# Patient Record
Sex: Male | Born: 1945
Health system: Southern US, Community
[De-identification: ages and names within clinical notes are randomized; demographics above are authoritative.]

## PROBLEM LIST (undated history)

## (undated) DIAGNOSIS — Z87438 Personal history of other diseases of male genital organs: Secondary | ICD-10-CM

## (undated) DIAGNOSIS — N2889 Other specified disorders of kidney and ureter: Secondary | ICD-10-CM

## (undated) DIAGNOSIS — Z8719 Personal history of other diseases of the digestive system: Secondary | ICD-10-CM

## (undated) DIAGNOSIS — I493 Ventricular premature depolarization: Secondary | ICD-10-CM

## (undated) DIAGNOSIS — K219 Gastro-esophageal reflux disease without esophagitis: Secondary | ICD-10-CM

## (undated) DIAGNOSIS — I447 Left bundle-branch block, unspecified: Secondary | ICD-10-CM

## (undated) DIAGNOSIS — I5022 Chronic systolic (congestive) heart failure: Secondary | ICD-10-CM

## (undated) DIAGNOSIS — I25719 Atherosclerosis of autologous vein coronary artery bypass graft(s) with unspecified angina pectoris: Secondary | ICD-10-CM

## (undated) DIAGNOSIS — Z952 Presence of prosthetic heart valve: Secondary | ICD-10-CM

## (undated) DIAGNOSIS — G4733 Obstructive sleep apnea (adult) (pediatric): Secondary | ICD-10-CM

## (undated) DIAGNOSIS — K2210 Ulcer of esophagus without bleeding: Secondary | ICD-10-CM

## (undated) DIAGNOSIS — Z87442 Personal history of urinary calculi: Secondary | ICD-10-CM

## (undated) DIAGNOSIS — Z8639 Personal history of other endocrine, nutritional and metabolic disease: Secondary | ICD-10-CM

## (undated) DIAGNOSIS — K029 Dental caries, unspecified: Secondary | ICD-10-CM

## (undated) DIAGNOSIS — I350 Nonrheumatic aortic (valve) stenosis: Secondary | ICD-10-CM

## (undated) DIAGNOSIS — Z951 Presence of aortocoronary bypass graft: Secondary | ICD-10-CM

## (undated) DIAGNOSIS — E7800 Pure hypercholesterolemia, unspecified: Secondary | ICD-10-CM

## (undated) DIAGNOSIS — N200 Calculus of kidney: Secondary | ICD-10-CM

## (undated) DIAGNOSIS — I10 Essential (primary) hypertension: Secondary | ICD-10-CM

## (undated) DIAGNOSIS — I2510 Atherosclerotic heart disease of native coronary artery without angina pectoris: Secondary | ICD-10-CM

## (undated) HISTORY — PX: CARDIAC CATHETERIZATION: SHX172

## (undated) HISTORY — DX: Gastro-esophageal reflux disease without esophagitis: K21.9

## (undated) HISTORY — DX: Atherosclerosis of autologous vein coronary artery bypass graft(s) with unspecified angina pectoris: I25.719

## (undated) HISTORY — PX: CYSTOSCOPY WITH STENT PLACEMENT: SHX5790

## (undated) HISTORY — DX: Nonrheumatic aortic (valve) stenosis: I35.0

## (undated) HISTORY — DX: Personal history of other diseases of the digestive system: Z87.19

## (undated) HISTORY — PX: TONSILLECTOMY: SUR1361

## (undated) HISTORY — DX: Chronic systolic (congestive) heart failure: I50.22

## (undated) HISTORY — DX: Personal history of other diseases of male genital organs: Z87.438

## (undated) HISTORY — PX: COLONOSCOPY W/ POLYPECTOMY: SHX1380

## (undated) HISTORY — DX: Pure hypercholesterolemia, unspecified: E78.00

## (undated) HISTORY — DX: Other specified disorders of kidney and ureter: N28.89

## (undated) HISTORY — DX: Personal history of urinary calculi: Z87.442

## (undated) HISTORY — DX: Atherosclerotic heart disease of native coronary artery without angina pectoris: I25.10

## (undated) HISTORY — DX: Presence of aortocoronary bypass graft: Z95.1

## (undated) HISTORY — DX: Personal history of other endocrine, nutritional and metabolic disease: Z86.39

---

## 1994-01-12 HISTORY — PX: CORONARY ARTERY BYPASS GRAFT: SHX141

## 1995-01-07 DIAGNOSIS — Z951 Presence of aortocoronary bypass graft: Secondary | ICD-10-CM

## 1995-01-07 HISTORY — DX: Presence of aortocoronary bypass graft: Z95.1

## 1997-05-18 ENCOUNTER — Other Ambulatory Visit: Admission: RE | Admit: 1997-05-18 | Discharge: 1997-05-18 | Payer: Self-pay | Admitting: Cardiology

## 1997-06-13 ENCOUNTER — Other Ambulatory Visit: Admission: RE | Admit: 1997-06-13 | Discharge: 1997-06-13 | Payer: Self-pay | Admitting: Urology

## 1998-06-19 ENCOUNTER — Ambulatory Visit (HOSPITAL_COMMUNITY): Admission: RE | Admit: 1998-06-19 | Discharge: 1998-06-19 | Payer: Self-pay | Admitting: Gastroenterology

## 1998-08-20 ENCOUNTER — Ambulatory Visit (HOSPITAL_COMMUNITY): Admission: RE | Admit: 1998-08-20 | Discharge: 1998-08-20 | Payer: Self-pay | Admitting: Gastroenterology

## 2000-09-22 ENCOUNTER — Ambulatory Visit (HOSPITAL_BASED_OUTPATIENT_CLINIC_OR_DEPARTMENT_OTHER): Admission: RE | Admit: 2000-09-22 | Discharge: 2000-09-22 | Payer: Self-pay | Admitting: Otolaryngology

## 2001-07-11 ENCOUNTER — Encounter: Admission: RE | Admit: 2001-07-11 | Discharge: 2001-07-11 | Payer: Self-pay | Admitting: Urology

## 2001-07-11 ENCOUNTER — Encounter: Payer: Self-pay | Admitting: Urology

## 2002-02-02 ENCOUNTER — Ambulatory Visit (HOSPITAL_COMMUNITY): Admission: RE | Admit: 2002-02-02 | Discharge: 2002-02-02 | Payer: Self-pay | Admitting: Cardiology

## 2005-02-03 ENCOUNTER — Inpatient Hospital Stay (HOSPITAL_BASED_OUTPATIENT_CLINIC_OR_DEPARTMENT_OTHER): Admission: RE | Admit: 2005-02-03 | Discharge: 2005-02-03 | Payer: Self-pay | Admitting: Cardiology

## 2008-09-13 ENCOUNTER — Encounter: Admission: RE | Admit: 2008-09-13 | Discharge: 2008-09-13 | Payer: Self-pay | Admitting: Cardiology

## 2009-12-26 ENCOUNTER — Ambulatory Visit: Payer: Self-pay | Admitting: Cardiology

## 2010-02-28 ENCOUNTER — Ambulatory Visit (INDEPENDENT_AMBULATORY_CARE_PROVIDER_SITE_OTHER): Payer: BC Managed Care – PPO | Admitting: Cardiology

## 2010-02-28 DIAGNOSIS — R079 Chest pain, unspecified: Secondary | ICD-10-CM

## 2010-02-28 DIAGNOSIS — I2510 Atherosclerotic heart disease of native coronary artery without angina pectoris: Secondary | ICD-10-CM

## 2010-05-02 ENCOUNTER — Other Ambulatory Visit: Payer: Self-pay | Admitting: *Deleted

## 2010-05-02 DIAGNOSIS — K219 Gastro-esophageal reflux disease without esophagitis: Secondary | ICD-10-CM

## 2010-05-02 MED ORDER — PANTOPRAZOLE SODIUM 40 MG PO TBEC: 40.0000 mg | DELAYED_RELEASE_TABLET | Freq: Every day | ORAL | Status: DC

## 2010-05-02 NOTE — Telephone Encounter (Signed)
escribe medication per fax request  

## 2010-05-22 ENCOUNTER — Other Ambulatory Visit: Payer: Self-pay | Admitting: *Deleted

## 2010-05-22 MED ORDER — METOPROLOL SUCCINATE ER 50 MG PO TB24: 50.0000 mg | ORAL_TABLET | Freq: Every day | ORAL | Status: DC

## 2010-05-22 NOTE — Telephone Encounter (Signed)
escribe medication per fax request  

## 2010-05-30 NOTE — Cardiovascular Report (Signed)
NAME:  [name], [name]                        ACCOUNT NO.:  [id number]   MEDICAL RECORD NO.:  [id number]                   PATIENT TYPE:  OIB   LOCATION:  2899                                 FACILITY:  [facility]   PHYSICIAN:  [name]. Swaziland, M.D.               DATE OF BIRTH:  [birth year]   DATE OF PROCEDURE:  DATE OF DISCHARGE:  02/02/2002                              CARDIAC CATHETERIZATION   INDICATIONS FOR PROCEDURE:  The patient is a 65 year old white male, status  post coronary artery bypass surgery in 1996 who presents with recurrent  anginal symptoms.  Stress Cardiolite study shows a lateral wall defect.   ACCESS:  Via the right femoral artery using the standard Seldinger  technique.   EQUIPMENT:  The 6 French 4 cm right and left Judkins catheter, 6 French  pigtail catheter, 6 French right vein graft catheter, 6 French arterial  sheath.   MEDICATIONS:  Local anesthesia with 1% Xylocaine, Versed 2 mg IV.   CONTRAST:  Omnipaque 220 mL.   HEMODYNAMIC DATA:  Aortic pressure is 149/87 with a mean of 114.  Left  ventricular  pressure is 140 with an EDP of 12 mmHg.   ANGIOGRAPHIC DATA:  Left coronary artery:  The left coronary artery arises  and distributes normally.   Left main:  The left main coronary artery is short with approximately 20-30%  narrowing.   Left anterior descending:  The left anterior descending artery is occluded  in the mid vessel.  The first diagonal branch is a very small branch which  has a 95% ostial stenosis.   Left circumflex coronary artery:  The left circumflex coronary artery  demonstrates occlusion of the proximal first marginal branch.  The second  marginal branch has diffuse severe 90% disease in a long segmental fashion.   Right coronary artery:  The right coronary artery arises and distributes  normally.  It is severely and diffusely diseased.  There is 90% segmental  stenosis proximally, and then there is severe diffuse disease  involving the  entire mid to distal vessel up to 95%.   The saphenous vein graft to first and second obtuse marginal branch is  patent.  However, the first marginal branch is occluded after the vein graft  insertion.  There are faint left to left collaterals to the distal vessel.   The saphenous vein graft to the acute marginal, PDA and posterolateral  branches of the right coronary artery is widely patent.  There is smooth 20%  narrowing at the proximal vein graft.   The LIMA graft to the LAD is widely patent.   LEFT VENTRICULAR ANGIOGRAPHY:  The left ventricular angiography is performed  in the RAO and LAO cranial views, demonstrates normal left ventricular size  with apical hypokinesia.  Ejection fraction is estimated 50%.  There is no  significant mitral insufficiency.   FINAL INTERPRETATION:  1. Severe three-vessel obstructive atherosclerotic coronary  artery.  2. Patent grafts including saphenous vein graft to the first and second     obtuse marginal branches, saphenous vein graft to the posterolateral and     posterior descending artery branches of the right coronary artery, and     patent left internal mammary artery graft to left anterior descending.  3. Mild left ventricular dysfunction.   PLAN:  I would recommend continued medical therapy.  The occlusion in the  first marginal branch is consistent with the findings of his Cardiolite  study.  Would recommend continued medical therapy.                                                   [name]. Swaziland, M.D.    PMJ/MEDQ  D:  02/02/2002  T:  02/03/2002  Job:  [id number]   cc:   ___________ Yetta Barre, M.D.   Excell Seltzer. Annabell Howells, M.D.  [address]  [place]  [place] [address]  Fax: [phone]

## 2010-05-30 NOTE — H&P (Signed)
[name], Barnes NO.:  [id number]   MEDICAL RECORD NO.:  [id number]                    PATIENT TYPE:   LOCATION:                                       FACILITY:   PHYSICIAN:  [name]. Swaziland, M.D.               DATE OF BIRTH:  1945/03/19   DATE OF ADMISSION:  02/02/2002  DATE OF DISCHARGE:                                HISTORY & PHYSICAL   HISTORY OF PRESENT ILLNESS:  The patient is a 65 year old white male with  multiple cardiac risk factors and a history of coronary artery disease.  He  is status post coronary artery bypass graft surgery in December 1996, by Dr.  Delsa Grana. [name].  This included a LIMA graft to the LAD and diagonal  branches, a saphenous vein graft to the obtuse marginal branch of the left  circumflex coronary artery, and saphenous vein graft to the acute marginal,  the posterior descending, and the posterolateral branches of the right  coronary artery.  The patient has been poorly compliant with life style  modification since that time.  He did have a prior history of tobacco abuse,  and has stopped that, but has had persistent weight gain, obesity, and has  failed to follow any exercise or diet program.  Recently he has been  experiencing symptoms of chest discomfort predominantly at night.  He will  go to sleep and then wake up one hour later with substernal chest pain, and  will usually subside after an hour.  He has had some discomfort in his arm  as well.  He also has noticed chest discomfort when doing heavy lifting  during the day.  To further evaluate his symptoms, he underwent a stress  Cardiolite study on January 24, 2002.  This demonstrated evidence of a  large lateral wall defect which was partially reversible.  He had moderate  left ventricular dysfunction with an ejection fraction of 42%, despite the  fact that he walked for nine minutes on the Bruce protocol.  He did complain  of some bilateral arm pain with  exercise.  Because of his abnormal  Cardiolite study, the patient is now admitted for a cardiac catheterization.   PAST MEDICAL HISTORY:  1. Significant for gastroesophageal reflux disease.  2. Morbid obesity.  3. Hypercholesterolemia.  4. Hypertension.  5. Sleep apnea.  6. He has also had a history of prostatitis.  7. He has a history of erectile dysfunction.   MEDICATIONS:  1. Coated aspirin 325 mg daily.  2. Lipitor 80 mg daily.  3. Prevacid 30 mg daily.  4. Norvasc 5 mg daily.  5. HCTZ 12.5 mg daily.  6. Flomax 0.4 mg daily.   SOCIAL HISTORY:  The patient is married.  He works as a Medical illustrator.  He  previously smoked, but quit in 1996.  He drinks alcohol socially.   FAMILY HISTORY:  His  father died of a myocardial infarction at age 18.  Mother is in good health.  One sister died with uterine cancer.   REVIEW OF SYSTEMS:  The patient has been diagnosed with sleep apnea;  however, he does not wear a CPAP mask because he states this just falls off  at night.  He does not exercise.  He has had consistent weight gain.  Other  review of systems are negative.   PHYSICAL EXAMINATION:  GENERAL:  The patient is an obese white male, in no  apparent distress.  Weight is 255 pounds.  VITAL SIGNS:  Blood pressure 128/88, pulse 98 and regular.  HEENT:  Pupils equal, round, reactive.  Conjunctivae clear.  Oropharynx  clear.  NECK:  Supple without jugular venous distention, adenopathy, thyromegaly, or  bruits.  LUNGS:  Clear.  HEART:  A regular rate and rhythm, without murmurs, rubs, or gallops.  ABDOMEN:  Soft, nontender.  EXTREMITIES:  Without edema.  Pulses are 2+ and symmetric.   LABORATORY DATA:  A recent lipid panel shows a cholesterol of 182,  triglycerides 133, HDL 40, LDL 115.  AST is elevated at 42, ALT 60.  Other  liver function tests are normal.  ELECTROCARDIOGRAM:  Shows a normal sinus rhythm, normal electrocardiogram.  A chest x-ray shows cardiomegaly with no active  disease.   IMPRESSION:  1. Arteriosclerotic coronary artery disease, severe, status post coronary     artery bypass graft surgery in 1996, now with recurrent angina and     abnormal stress Cardiolite study.  2. Hypertension.  3. Hypercholesterolemia.  4. Obesity.  5. Sleep apnea.  6. Gastroesophageal reflux disease.  7. Prostatism.   PLAN:  The patient will be admitted for a cardiac catheterization and  further therapy, pending these results.                                                [name]. Swaziland, M.D.    PMJ/MEDQ  D:  01/30/2002  T:  01/30/2002  Job:  [id number]   cc:   Knox Royalty, M.D.   Bjorn Pippin, M.D.

## 2010-05-30 NOTE — H&P (Signed)
NAMEJOESPH, MARCY NO.:  [id number]   MEDICAL RECORD NO.:  [id number]            PATIENT TYPE:   LOCATION:                                 FACILITY:   PHYSICIAN:  [name]. Swaziland, M.D.  DATE OF BIRTH:  July 09, 1945   DATE OF ADMISSION:  DATE OF DISCHARGE:                                HISTORY & PHYSICAL   HISTORY OF PRESENT ILLNESS:  Mr. Wherry is a 65 year old white male, who  has a known history of coronary artery disease.  He underwent coronary  artery bypass surgery in 1996.  He had saphenous vein graft sequentially to  the first and second obtuse marginal vessels.  He also had a saphenous vein  graft to the acute marginal PDA and posterior lateral branches of the right  coronary artery and an LIMA graft to the LAD.  He had subsequent abnormal  stress Cardiolite study in 2004 and underwent repeat cardiac  catheterization, this demonstrated all his grafts were patent.  However, the  first marginal vessel was occluded after insertion of the vein graft and was  filled by faint left-to-left collaterals.  The patient was treated medically  since then.  At this time he is complaining of increasing substernal chest  pain with exertion.  He describes this as predominantly a tightness,  sometimes associated with shortness of breath.  Previously he ascribed this  to a change in the seasons, but his symptoms have been more progressive of  late and have limited him from almost any activity.  Because of his  refractory symptoms and progressive nature, it is recommended he undergo  repeat cardiac catheterization.   PAST MEDICAL HISTORY:  1.  Coronary artery disease with remote lateral myocardial infarction.      Status post CABG in 1996.  2.  History of obesity.  3.  Hypercholesterolemia.  4.  History of hypertension.  5.  History of hiatal hernia.  6.  BPH.  7.  History of kidney stones.  8.  History of renal cysts.  9.  History of erectile dysfunction.   CURRENT MEDICATIONS:  1.  Aspirin 325 mg daily.  2.  Prevacid 30 mg  b.i.d.  3.  Norvasc 5 mg daily.  4.  HCTZ 12.5 mg daily.  5.  VYTORIN 10/80 mg per day.  6.  Mobic 7.5 mg p.r.n.  7.  Finasteride 5 mg per day.   SOCIAL HISTORY:  The patient denies any tobacco use or alcohol use.  He is  married and has children.  He works as a Medical illustrator.   FAMILY HISTORY:  Father died at age 12, myocardial infarction.  Mother has  been in good health.  He had a sister who died of uterine cancer.   REVIEW OF SYSTEMS:  Otherwise unremarkable.   PHYSICAL EXAMINATION:  The patient is a well-developed white male in no  apparent distress.  His weight 251, blood pressure 110/70, pulse 80 and  regular, respirations are normal.  HEENT:  Normocephalic and atraumatic.  Pupils equal, round and reactive to  light and accommodation.  Extraocular movements are full.  Oropharynx is  clear.  NECK:  Without JVD, adenopathy, thyromegaly or bruits.  LUNGS:  Clear.  CARDIAC:  Regular rate and rhythm without gallop, murmur, rub or click.  ABDOMEN:  Soft, obese and nontender without masses or bruits.  EXTREMITIES:  Without edema.  Pulses are good throughout.  NEUROLOGIC:  Exam is nonfocal.   LABORATORY DATA:  Coags were normal.  CBC is normal.  BMET is normal.  His  ECG shows normal sinus rhythm and is normal.  Chest x-ray shows cardiomegaly  and low lung volumes, otherwise no active disease.   IMPRESSION:  1.  Increasing symptom of exertional chest pain consistent with angina.  2.  Atherosclerotic cardiovascular disease status post coronary artery      bypass grafting 1996.  3.  Hypertension.  4.  Hypercholesterolemia.  5.  Obesity.  6.  History of hiatal hernia.   PLAN:  We will admit for diagnostic cardiac catheterization.           ______________________________  [name]. Swaziland, M.D.     PMJ/MEDQ  D:  01/29/2005  T:  01/29/2005  Job:  [id number]   cc:   Faythe Ghee, MD

## 2010-05-30 NOTE — Cardiovascular Report (Signed)
[name], [name]              ACCOUNT NO.:  [id number]   MEDICAL RECORD NO.:  [id number]          PATIENT TYPE:  OIB   LOCATION:  1961                         FACILITY:  [facility]   PHYSICIAN:  [name]. Swaziland, M.D.  DATE OF BIRTH:  February 21, 1945   DATE OF PROCEDURE:  02/03/2005  DATE OF DISCHARGE:  02/03/2005                              CARDIAC CATHETERIZATION   INDICATIONS FOR PROCEDURE:  The patient is a 65 year old white male who is  status post coronary bypass surgery who presents with increasing exertional  angina. Last cardiac catheterization was three years prior.   PROCEDURES:  Left heart catheterization, coronary and left ventriculography,  saphenous vein graft angiography x2 and LIMA graft angiography.   EQUIPMENT USED:  A 4-French 4 cm right and left Judkins catheters, 4-French  pigtail catheter, 4-French LIMA catheter and 4-French multipurpose catheter.   CONTRAST:  170 cc of Omnipaque.   MEDICATIONS:  Local anesthesia 1% Xylocaine, Versed 10 milligrams p.o. prior  to the procedure.   HEMODYNAMIC DATA:  The aortic pressure 146/90, left ventricular pressures  150 with EDP of 15.   ANGIOGRAPHIC DATA:  Left coronary arises and distributes normally. Left main  coronary appears normal.   Left anterior descending artery is diffusely diseased in the proximal vessel  up to 60% and is then occluded in the midvessel. The first diagonal is a  small branch. It is subtotally occluded at the ostium.   The left circumflex coronary gives rise to three marginal vessels. The first  marginal vessel is a very small branch without significant disease. The  second marginal branch has an 80% stenosis at the ostium and is occluded.  The third marginal branch has a very long diffuse disease up to 80-90%.   The right coronary arises and distributes normally. It has severe disease  with diffuse 90% stenosis proximally. It is occluded in the midvessel.   Saphenous vein graft fills  sequentially in the second and third obtuse  marginal vessels. The graft is widely patent with good distal runoff into  the OM-3. The OM-2 branch fills retrograde into two small side branches but  the mid to distal OM-2 is occluded after insertion of the vein graft.   The saphenous vein graft to the right coronary supplies the acute marginal  branch, PDA and posterolateral branches. The right coronary is widely patent  with excellent runoff.   The LIMA graft to the LAD is widely patent with excellent runoff.   The left ventricular angiography was performed in RAO view. This  demonstrates severe hypokinesia of the distal anterolateral wall with  overall mild left ventricular dysfunction. Ejection fraction is estimated at  45%. There is no mitral regurgitation or prolapse.   FINAL INTERPRETATION:  1.  Severe three-vessel obstructive atherosclerotic coronary artery disease.  2.  All grafts were patent including saphenous vein graft to the right      coronary artery marginal, right ventricular marginal, posterior      descending artery and posterolateral branches to the right coronary      artery. Saphenous vein graft sequentially to the second and third  obtuse      marginal vessels and left internal mammary artery graft to the left      anterior descending artery.  3.  Mild left ventricular dysfunction.  4.  Comparison made with no prior diagnostic cath in January 2004. The major      differences the first diagonal branch is now subtotally occluded. This      is small-caliber vessel less than 1 mm diameter. It is not suitable for      intervention. The occlusion of the second marginal vessel was old.   PLAN:  Would recommend continued medical therapy.           ______________________________  [name]. Swaziland, M.D.     PMJ/MEDQ  D:  02/03/2005  T:  02/03/2005  Job:  [id number]   cc:   [name], M.D.

## 2010-06-10 ENCOUNTER — Other Ambulatory Visit: Payer: Self-pay | Admitting: *Deleted

## 2010-06-10 MED ORDER — ISOSORBIDE MONONITRATE ER 60 MG PO TB24: 60.0000 mg | ORAL_TABLET | ORAL | Status: DC

## 2010-06-10 NOTE — Telephone Encounter (Signed)
escribe medication per fax request  

## 2010-06-30 ENCOUNTER — Other Ambulatory Visit: Payer: Self-pay | Admitting: Cardiology

## 2010-06-30 MED ORDER — RAMIPRIL 5 MG PO CAPS: 5.0000 mg | ORAL_CAPSULE | Freq: Every day | ORAL | Status: DC

## 2010-06-30 MED ORDER — EZETIMIBE-SIMVASTATIN 10-80 MG PO TABS: 1.0000 | ORAL_TABLET | Freq: Every day | ORAL | Status: DC

## 2010-06-30 NOTE — Telephone Encounter (Signed)
Med refill

## 2010-10-16 ENCOUNTER — Other Ambulatory Visit: Payer: Self-pay | Admitting: *Deleted

## 2010-10-16 MED ORDER — NITROGLYCERIN 0.4 MG SL SUBL: 0.4000 mg | SUBLINGUAL_TABLET | SUBLINGUAL | Status: DC | PRN

## 2010-10-16 NOTE — Telephone Encounter (Signed)
Refilled ntg °

## 2010-11-27 ENCOUNTER — Other Ambulatory Visit: Payer: Self-pay | Admitting: *Deleted

## 2010-11-27 DIAGNOSIS — K219 Gastro-esophageal reflux disease without esophagitis: Secondary | ICD-10-CM

## 2010-11-27 MED ORDER — PANTOPRAZOLE SODIUM 40 MG PO TBEC: 40.0000 mg | DELAYED_RELEASE_TABLET | Freq: Every day | ORAL | Status: DC

## 2010-11-27 MED ORDER — METOPROLOL SUCCINATE ER 50 MG PO TB24: 50.0000 mg | ORAL_TABLET | Freq: Every day | ORAL | Status: DC

## 2010-12-10 ENCOUNTER — Other Ambulatory Visit: Payer: Self-pay | Admitting: Cardiology

## 2010-12-10 MED ORDER — ISOSORBIDE MONONITRATE ER 60 MG PO TB24: 60.0000 mg | ORAL_TABLET | ORAL | Status: DC

## 2010-12-17 ENCOUNTER — Other Ambulatory Visit: Payer: Self-pay | Admitting: *Deleted

## 2010-12-17 MED ORDER — NITROGLYCERIN 0.4 MG SL SUBL: 0.4000 mg | SUBLINGUAL_TABLET | SUBLINGUAL | Status: DC | PRN

## 2010-12-19 ENCOUNTER — Other Ambulatory Visit: Payer: Self-pay | Admitting: Cardiology

## 2010-12-19 MED ORDER — NITROGLYCERIN 0.4 MG SL SUBL: 0.4000 mg | SUBLINGUAL_TABLET | SUBLINGUAL | Status: DC | PRN

## 2010-12-19 NOTE — Telephone Encounter (Signed)
Refilled NTG 

## 2010-12-19 NOTE — Telephone Encounter (Signed)
Fu up to previous call , pt calling again re problem with qty in rx , [phone]

## 2010-12-19 NOTE — Telephone Encounter (Signed)
New Msg: Pt calling wanting to talk to nurse regarding RX. Pt was getting 100 tablets however RX says 25 tablets. Please return pt call to discuss further.

## 2010-12-23 ENCOUNTER — Other Ambulatory Visit: Payer: Self-pay | Admitting: *Deleted

## 2010-12-23 MED ORDER — NITROGLYCERIN 0.4 MG SL SUBL: 0.4000 mg | SUBLINGUAL_TABLET | SUBLINGUAL | Status: DC | PRN

## 2010-12-25 ENCOUNTER — Other Ambulatory Visit: Payer: Self-pay | Admitting: *Deleted

## 2010-12-25 MED ORDER — RAMIPRIL 5 MG PO CAPS: 5.0000 mg | ORAL_CAPSULE | Freq: Every day | ORAL | Status: DC

## 2010-12-30 ENCOUNTER — Other Ambulatory Visit: Payer: Self-pay | Admitting: Cardiovascular Disease

## 2010-12-30 MED ORDER — EZETIMIBE-SIMVASTATIN 10-80 MG PO TABS: 1.0000 | ORAL_TABLET | Freq: Every day | ORAL | Status: DC

## 2011-01-15 ENCOUNTER — Other Ambulatory Visit: Payer: Self-pay

## 2011-02-11 ENCOUNTER — Ambulatory Visit (INDEPENDENT_AMBULATORY_CARE_PROVIDER_SITE_OTHER): Payer: Medicare Other | Admitting: Cardiology

## 2011-02-11 ENCOUNTER — Encounter: Payer: Self-pay | Admitting: Cardiology

## 2011-02-11 VITALS — BP 122/70 | HR 56 | Ht 70.0 in | Wt 242.6 lb

## 2011-02-11 DIAGNOSIS — E7800 Pure hypercholesterolemia, unspecified: Secondary | ICD-10-CM

## 2011-02-11 DIAGNOSIS — Z8679 Personal history of other diseases of the circulatory system: Secondary | ICD-10-CM | POA: Insufficient documentation

## 2011-02-11 DIAGNOSIS — K219 Gastro-esophageal reflux disease without esophagitis: Secondary | ICD-10-CM

## 2011-02-11 DIAGNOSIS — E785 Hyperlipidemia, unspecified: Secondary | ICD-10-CM

## 2011-02-11 DIAGNOSIS — Z8249 Family history of ischemic heart disease and other diseases of the circulatory system: Secondary | ICD-10-CM

## 2011-02-11 DIAGNOSIS — I2510 Atherosclerotic heart disease of native coronary artery without angina pectoris: Secondary | ICD-10-CM | POA: Insufficient documentation

## 2011-02-11 DIAGNOSIS — I447 Left bundle-branch block, unspecified: Secondary | ICD-10-CM

## 2011-02-11 DIAGNOSIS — Z87898 Personal history of other specified conditions: Secondary | ICD-10-CM

## 2011-02-11 DIAGNOSIS — I10 Essential (primary) hypertension: Secondary | ICD-10-CM

## 2011-02-11 DIAGNOSIS — Z8639 Personal history of other endocrine, nutritional and metabolic disease: Secondary | ICD-10-CM | POA: Insufficient documentation

## 2011-02-11 LAB — BASIC METABOLIC PANEL
BUN: 19 mg/dL (ref 6–23)
CO2: 28 mEq/L (ref 19–32)
Calcium: 9.7 mg/dL (ref 8.4–10.5)
Chloride: 102 mEq/L (ref 96–112)
Creatinine, Ser: 0.9 mg/dL (ref 0.4–1.5)

## 2011-02-11 LAB — LIPID PANEL
Cholesterol: 166 mg/dL (ref 0–200)
HDL: 40.2 mg/dL (ref >39.00)
Triglycerides: 136 mg/dL (ref 0.0–149.0)

## 2011-02-11 LAB — HEPATIC FUNCTION PANEL
ALT: 46 U/L (ref 0–53)
Albumin: 4.2 g/dL (ref 3.5–5.2)
Bilirubin, Direct: 0.2 mg/dL (ref 0.0–0.3)
Total Protein: 7.2 g/dL (ref 6.0–8.3)

## 2011-02-11 MED ORDER — NITROGLYCERIN 0.4 MG SL SUBL: 0.4000 mg | SUBLINGUAL_TABLET | SUBLINGUAL | Status: DC | PRN

## 2011-02-11 MED ORDER — ATORVASTATIN CALCIUM 80 MG PO TABS: 80.0000 mg | ORAL_TABLET | Freq: Every day | ORAL | Status: DC

## 2011-02-11 NOTE — Assessment & Plan Note (Signed)
Blood pressure is well controlled on current medication. I've encouraged him with his continued activity and weight loss.

## 2011-02-11 NOTE — Assessment & Plan Note (Signed)
We will check fasting lab work today. We will switch his Vytorin to Lipitor 80 mg per day and recheck the lipids when he comes back in 6 months.

## 2011-02-11 NOTE — Patient Instructions (Signed)
We will switch Vytorin to Lipitor 80 mg daily.  Continue your other medications.  I would recommend a follow up stress test. Think about it.

## 2011-02-11 NOTE — Assessment & Plan Note (Signed)
Status post CABG in 1996. Cardiac catheterization in 2007 showed all his grafts were patent. His last stress test in 2009 was normal. He still has chronic chest pain symptoms. The fact that these occur mostly at night suggestive more of a problem with acid reflux. He does have response to nitrates. I suggested a followup nuclear stress test but he is concerned about cost. He is going to think about it. We have refilled his nitrates today.

## 2011-02-11 NOTE — Progress Notes (Signed)
[name] Date of Birth: 10/18/1945 Medical Record #[id number]  History of Present Illness: [name] is seen for followup today. He has a known history of coronary disease and is status post CABG in 1996. This included an LIMA graft to the LAD and diagonal, sequential vein graft to the acute marginal, PDA, and posterior lateral branches of the right coronary, sequential saphenous vein graft to the obtuse marginal vessel and distal circumflex. He had a cardiac catheterization in 2007 which showed that his grafts were all patent. He has continued to have problems with chronic chest pain. This does respond to nitrate therapy. There was no change with Ranexa. He still has symptoms of chest pain off and on and uses nitroglycerin on average 2-3 times per week. Sometimes he has to take more than that at nighttime. This doesn't really limit his exercise. He continues to work out regularly and has lost 11 pounds. He is feeling very well. He notes that Vytorin is too expensive for him now and he wants to consider alternative therapy.  Current Outpatient Prescriptions on File Prior to Visit  Medication Sig Dispense Refill  . isosorbide mononitrate (IMDUR) 60 MG 24 hr tablet Take 1 tablet (60 mg total) by mouth every morning.  30 tablet  5  . metoprolol (TOPROL XL) 50 MG 24 hr tablet Take 1 tablet (50 mg total) by mouth daily.  30 tablet  5  . pantoprazole (PROTONIX) 40 MG tablet Take 1 tablet (40 mg total) by mouth daily.  30 tablet  5  . ramipril (ALTACE) 5 MG capsule Take 1 capsule (5 mg total) by mouth daily.  30 capsule  1    No Known Allergies  Past Medical History  Diagnosis Date  . Coronary artery disease   . FH: CABG (coronary artery bypass surgery) 1996  . History of obesity   . Hypercholesterolemia   . History of hypertension   . History of hiatal hernia   . History of kidney stones   . History of erectile dysfunction   . Gastroesophageal reflux disease   . Sleep apnea   . History of  prostatitis     Past Surgical History  Procedure Date  . Coronary artery bypass graft 1996  . Cardiac catheterization     Ejection fraction is estimated at 45%    History  Smoking status  . Unknown If Ever Smoked  Smokeless tobacco  . Not on file    History  Alcohol Use No    Family History  Problem Relation Age of Onset  . Heart attack Father   . Cancer Sister     Uterine Cancer    Review of Systems: The review of systems is positive for numbness in the soles of his feet. He does have chronic bone spurs.  He has no claudication symptoms. All other systems were reviewed and are negative.  Physical Exam: BP 122/70  Pulse 56  Ht 5\' 10"  (1.778 m)  Wt 242 lb 9.6 oz (110.043 kg)  BMI 34.81 kg/m2 He is an obese white male in no acute distress.The patient is alert and oriented x 3.  ECG demonstrates sinus bradycardia with a rate of 57 beats per minute. He has a left bundle branch block.The mood and affect are normal.  The skin is warm and dry.  Color is normal.  The HEENT exam reveals that the sclera are nonicteric.  The mucous membranes are moist.  The carotids are 2+ without bruits.  There is no  thyromegaly.  There is no JVD.  The lungs are clear.  The chest wall is non tender.  The heart exam reveals a regular rate with a normal S1 and S2.  There are no murmurs, gallops, or rubs.  The PMI is not displaced.   Abdominal exam reveals good bowel sounds.  There is no guarding or rebound.  There is no hepatosplenomegaly or tenderness.  There are no masses.  Exam of the legs reveal no clubbing, cyanosis, or edema.  The legs are without rashes.  The distal pulses are intact.  Cranial nerves II - XII are intact.  Motor and sensory functions are intact.  The gait is normal.  LABORATORY DATA:   Assessment / Plan:

## 2011-02-12 ENCOUNTER — Other Ambulatory Visit: Payer: Self-pay

## 2011-02-24 ENCOUNTER — Other Ambulatory Visit: Payer: Self-pay | Admitting: *Deleted

## 2011-02-24 MED ORDER — RAMIPRIL 5 MG PO CAPS: 5.0000 mg | ORAL_CAPSULE | Freq: Every day | ORAL | Status: DC

## 2011-05-23 ENCOUNTER — Other Ambulatory Visit: Payer: Self-pay | Admitting: Cardiology

## 2011-05-25 ENCOUNTER — Other Ambulatory Visit: Payer: Self-pay | Admitting: *Deleted

## 2011-05-25 MED ORDER — METOPROLOL SUCCINATE ER 50 MG PO TB24: 50.0000 mg | ORAL_TABLET | Freq: Every day | ORAL | Status: DC

## 2011-06-09 ENCOUNTER — Other Ambulatory Visit: Payer: Self-pay | Admitting: Cardiology

## 2011-06-09 MED ORDER — ISOSORBIDE MONONITRATE ER 60 MG PO TB24: 60.0000 mg | ORAL_TABLET | ORAL | Status: DC

## 2011-06-09 NOTE — Telephone Encounter (Signed)
Out of pills he has been trying to get for 10 days thru pharmacy

## 2011-06-23 ENCOUNTER — Other Ambulatory Visit: Payer: Self-pay | Admitting: *Deleted

## 2011-06-23 MED ORDER — AZITHROMYCIN 250 MG PO TABS: ORAL_TABLET | ORAL | Status: AC

## 2011-06-23 NOTE — Telephone Encounter (Signed)
Received faxed request to fill Zithromax / script approved per Dr Swaziland

## 2011-08-31 ENCOUNTER — Other Ambulatory Visit: Payer: Self-pay | Admitting: Cardiology

## 2011-08-31 ENCOUNTER — Other Ambulatory Visit: Payer: Self-pay

## 2011-08-31 MED ORDER — ISOSORBIDE MONONITRATE ER 60 MG PO TB24: 60.0000 mg | ORAL_TABLET | ORAL | Status: DC

## 2011-09-18 ENCOUNTER — Encounter: Payer: Self-pay | Admitting: Cardiology

## 2011-09-21 ENCOUNTER — Encounter: Payer: Self-pay | Admitting: Cardiology

## 2011-09-22 ENCOUNTER — Other Ambulatory Visit: Payer: Self-pay | Admitting: Cardiology

## 2011-11-25 ENCOUNTER — Other Ambulatory Visit: Payer: Self-pay

## 2011-11-25 MED ORDER — METOPROLOL SUCCINATE ER 50 MG PO TB24: 50.0000 mg | ORAL_TABLET | Freq: Every day | ORAL | Status: DC

## 2011-11-26 ENCOUNTER — Other Ambulatory Visit: Payer: Self-pay

## 2011-11-26 MED ORDER — PANTOPRAZOLE SODIUM 40 MG PO TBEC: 40.0000 mg | DELAYED_RELEASE_TABLET | Freq: Every day | ORAL | Status: DC

## 2011-11-30 ENCOUNTER — Telehealth: Payer: Self-pay

## 2011-11-30 MED ORDER — NITROGLYCERIN 0.4 MG SL SUBL: 0.4000 mg | SUBLINGUAL_TABLET | SUBLINGUAL | Status: DC | PRN

## 2011-12-02 ENCOUNTER — Other Ambulatory Visit: Payer: Self-pay | Admitting: *Deleted

## 2011-12-02 MED ORDER — NITROGLYCERIN 0.4 MG SL SUBL: 0.4000 mg | SUBLINGUAL_TABLET | SUBLINGUAL | Status: DC | PRN

## 2011-12-02 NOTE — Telephone Encounter (Signed)
New Problem:    Called in because patient prefers to receive four bottles of 25 pills of his nitroGLYCERIN (NITROSTAT) 0.4 MG SL tablet at a time.  Please call back.

## 2011-12-21 ENCOUNTER — Other Ambulatory Visit: Payer: Self-pay

## 2012-02-16 ENCOUNTER — Other Ambulatory Visit: Payer: Self-pay | Admitting: Cardiology

## 2012-02-18 ENCOUNTER — Other Ambulatory Visit: Payer: Self-pay | Admitting: Cardiology

## 2012-03-15 ENCOUNTER — Other Ambulatory Visit: Payer: Self-pay | Admitting: Cardiology

## 2012-03-16 ENCOUNTER — Other Ambulatory Visit: Payer: Self-pay | Admitting: Cardiology

## 2012-03-16 ENCOUNTER — Other Ambulatory Visit: Payer: Self-pay

## 2012-03-17 ENCOUNTER — Other Ambulatory Visit: Payer: Self-pay | Admitting: Cardiology

## 2012-03-22 MED ORDER — RAMIPRIL 5 MG PO CAPS: 5.0000 mg | ORAL_CAPSULE | Freq: Every day | ORAL | Status: DC

## 2012-03-22 NOTE — Addendum Note (Signed)
Addended by: Meda Klinefelter D on: 03/22/2012 05:12 PM   Modules accepted: Orders

## 2012-04-07 ENCOUNTER — Other Ambulatory Visit: Payer: Self-pay | Admitting: Cardiology

## 2012-05-09 ENCOUNTER — Other Ambulatory Visit: Payer: Self-pay | Admitting: Cardiology

## 2012-05-11 ENCOUNTER — Other Ambulatory Visit: Payer: Self-pay | Admitting: Cardiology

## 2012-05-13 NOTE — Telephone Encounter (Signed)
Patient was called to find out why he was using so many NTG tablets.NTG 100 tablets was refilled 30 days ago and pharmacy requesting another refill.Patient answered his phone, I was explaining the reason for the phone call and patient hung up phone in middle of conversation.Unable to find out why he is using so many NTG.Spoke to pharmacist at [facility] he stated he thought patient was using NTG for a secondary use.Stated he thought he was taking for reflux.Advised to have patient call office.

## 2012-05-20 ENCOUNTER — Other Ambulatory Visit: Payer: Self-pay | Admitting: Cardiology

## 2012-05-31 ENCOUNTER — Other Ambulatory Visit: Payer: Self-pay | Admitting: Cardiology

## 2012-06-01 NOTE — Telephone Encounter (Signed)
isosorbide mononitrate (IMDUR) 60 MG 24 hr tablet  Take 1 tablet (60 mg total) by mouth every morning.   30 tablet   5    Patient Instructions  We will switch Vytorin to Lipitor 80 mg daily.  Continue your other medications.  I would recommend a follow up stress test. Think about it.  previous Visit     Provider Department Encounter #  02/09/2011  2:10 PM Peter Swaziland, MD [facility] [id number]

## 2012-06-03 ENCOUNTER — Other Ambulatory Visit: Payer: Self-pay

## 2012-06-03 MED ORDER — NITROGLYCERIN 0.4 MG SL SUBL: 0.4000 mg | SUBLINGUAL_TABLET | SUBLINGUAL | Status: DC | PRN

## 2012-06-03 NOTE — Telephone Encounter (Signed)
Needs nitro refill

## 2012-06-14 ENCOUNTER — Other Ambulatory Visit: Payer: Self-pay | Admitting: Cardiology

## 2012-06-15 ENCOUNTER — Other Ambulatory Visit: Payer: Self-pay | Admitting: Cardiology

## 2012-06-15 ENCOUNTER — Telehealth: Payer: Self-pay

## 2012-06-15 NOTE — Telephone Encounter (Signed)
Pending      Disp Refills Start End    pantoprazole (PROTONIX) 40 MG tablet [Pharmacy Med Name: PANTOPRAZOLE SODIUM 40MG  TER] 30 tablet 4 06/14/2012      Sig:  TAKE 1 TABLET (40 MG TOTAL) BY MOUTH DAILY.    DAW:  No    Comment:  pantoprazole is on short term back order, please consider alternative and send in new rx.  we will explain to patient.  BR rph 06/14/12

## 2012-06-22 ENCOUNTER — Other Ambulatory Visit: Payer: Self-pay | Admitting: Cardiology

## 2012-06-23 ENCOUNTER — Telehealth: Payer: Self-pay | Admitting: Cardiology

## 2012-06-23 ENCOUNTER — Other Ambulatory Visit: Payer: Self-pay | Admitting: Cardiology

## 2012-06-23 MED ORDER — PANTOPRAZOLE SODIUM 40 MG PO TBEC: 40.0000 mg | DELAYED_RELEASE_TABLET | Freq: Every day | ORAL | Status: DC

## 2012-06-23 NOTE — Telephone Encounter (Signed)
Returned call to [facility] spoke to pharmacist she stated they had sent a fax that they were out of protonix and was wanting a alternative drug.Stated they did receive a shipment in of protonix today and patient needed refills.Protonix refilled.

## 2012-06-23 NOTE — Telephone Encounter (Signed)
Returned call to [facility] phone rings busy.

## 2012-06-23 NOTE — Telephone Encounter (Signed)
New Prob      Calling to get an OKAY for PROTONIX. Please call.

## 2012-08-03 ENCOUNTER — Encounter: Payer: Self-pay | Admitting: Cardiology

## 2012-08-03 ENCOUNTER — Ambulatory Visit (INDEPENDENT_AMBULATORY_CARE_PROVIDER_SITE_OTHER): Payer: Medicare Other | Admitting: Cardiology

## 2012-08-03 VITALS — BP 100/56 | HR 68 | Ht 70.0 in | Wt 202.8 lb

## 2012-08-03 DIAGNOSIS — Z8679 Personal history of other diseases of the circulatory system: Secondary | ICD-10-CM

## 2012-08-03 DIAGNOSIS — I447 Left bundle-branch block, unspecified: Secondary | ICD-10-CM

## 2012-08-03 DIAGNOSIS — I2510 Atherosclerotic heart disease of native coronary artery without angina pectoris: Secondary | ICD-10-CM

## 2012-08-03 DIAGNOSIS — E7800 Pure hypercholesterolemia, unspecified: Secondary | ICD-10-CM

## 2012-08-03 LAB — LIPID PANEL
HDL: 39.2 mg/dL (ref >39.00)
LDL Cholesterol: 86 mg/dL (ref 0–99)
Total CHOL/HDL Ratio: 4
Triglycerides: 106 mg/dL (ref 0.0–149.0)

## 2012-08-03 LAB — BASIC METABOLIC PANEL
CO2: 29 mEq/L (ref 19–32)
Chloride: 106 mEq/L (ref 96–112)
Creatinine, Ser: 1 mg/dL (ref 0.4–1.5)
Sodium: 142 mEq/L (ref 135–145)

## 2012-08-03 LAB — HEPATIC FUNCTION PANEL: Albumin: 3.8 g/dL (ref 3.5–5.2)

## 2012-08-03 MED ORDER — ISOSORBIDE MONONITRATE ER 60 MG PO TB24: 60.0000 mg | ORAL_TABLET | Freq: Every day | ORAL | Status: DC

## 2012-08-03 MED ORDER — NITROGLYCERIN 0.4 MG SL SUBL: 0.4000 mg | SUBLINGUAL_TABLET | SUBLINGUAL | Status: DC | PRN

## 2012-08-03 MED ORDER — ATORVASTATIN CALCIUM 80 MG PO TABS: 80.0000 mg | ORAL_TABLET | Freq: Every day | ORAL | Status: DC

## 2012-08-03 MED ORDER — PANTOPRAZOLE SODIUM 40 MG PO TBEC: 40.0000 mg | DELAYED_RELEASE_TABLET | Freq: Every day | ORAL | Status: DC

## 2012-08-03 MED ORDER — RAMIPRIL 5 MG PO CAPS: 5.0000 mg | ORAL_CAPSULE | Freq: Every day | ORAL | Status: DC

## 2012-08-03 MED ORDER — METOPROLOL SUCCINATE ER 50 MG PO TB24: 50.0000 mg | ORAL_TABLET | Freq: Every day | ORAL | Status: DC

## 2012-08-03 NOTE — Patient Instructions (Signed)
Continue your current therapy  We will check fasting lab work today  I will see you in 6 months. 

## 2012-08-03 NOTE — Progress Notes (Signed)
[name] Date of Birth: [birth year] Medical Record #[id number]  History of Present Illness: [name] is seen for followup today. He has a known history of coronary disease and is status post CABG in 1996. This included an LIMA graft to the LAD and diagonal, sequential vein graft to the acute marginal, PDA, and posterior lateral branches of the right coronary, sequential saphenous vein graft to the obtuse marginal vessel and distal circumflex. He had a cardiac catheterization in 2007 which showed that his grafts were all patent. He has continued to have problems with chronic chest pain. This does respond to nitrate therapy. His pain is described as a right precordial pain. It only occurs when he is lying down or if he eats certain foods such as garlic, onions, or acidic foods. He continues to exercise daily and does heavy yard work. He has no discomfort with these activities. He has made significant dietary modifications and has lost 40 pounds since his last visit. On his last visit we did switch him from Vytorin to atorvastatin. He is due for fasting lab work. His last Myoview study in 2009 showed a lateral wall scar with no ischemia. Ejection fraction was 38%. Echocardiogram at the same time showed an ejection fraction of 50-55%.  Current Outpatient Prescriptions on File Prior to Visit  Medication Sig Dispense Refill  . Ascorbic Acid (VITAMIN C PO) Take by mouth daily.      . ASPIRIN PO Take by mouth daily.      . Cholecalciferol (VITAMIN D PO) Take by mouth daily.      . finasteride (PROSCAR) 5 MG tablet Take 1 tablet (5 mg total) by mouth daily.  30 tablet     No current facility-administered medications on file prior to visit.    No Known Allergies  Past Medical History  Diagnosis Date  . Coronary artery disease   . FH: CABG (coronary artery bypass surgery) 1996  . History of obesity   . Hypercholesterolemia   . History of hypertension   . History of hiatal hernia   . History of  kidney stones   . History of erectile dysfunction   . Gastroesophageal reflux disease   . Sleep apnea   . History of prostatitis     Past Surgical History  Procedure Laterality Date  . Coronary artery bypass graft  1996  . Cardiac catheterization      Ejection fraction is estimated at 45%    History  Smoking status  . Unknown If Ever Smoked  Smokeless tobacco  . Not on file    History  Alcohol Use No    Family History  Problem Relation Age of Onset  . Heart attack Father   . Cancer Sister     Uterine Cancer    Review of Systems: The review of systems is positive for numbness in the soles of his feet. This is unchanged. He has no claudication symptoms. All other systems were reviewed and are negative.  Physical Exam: BP 100/56  Pulse 68  Ht 5\' 10"  (1.778 m)  Wt 202 lb 12.8 oz (91.989 kg)  BMI 29.1 kg/m2 He is a pleasant white male in no acute distress.The patient is alert and oriented x 3.    The skin is warm and dry.   The HEENT exam is normal.  The carotids are 2+ without bruits.  There is no thyromegaly.  There is no JVD.  The lungs are clear.  The chest wall is non tender.  The heart exam reveals a regular rate with a normal S1 and S2.  There are no murmurs, gallops, or rubs.  The PMI is not displaced.   Abdominal exam reveals good bowel sounds.  There is no guarding or rebound.  There is no hepatosplenomegaly or tenderness.  There are no masses.  Exam of the legs reveal no clubbing, cyanosis, or edema.  The legs are without rashes.  The distal pulses are intact.  Cranial nerves II - XII are intact.  Motor and sensory functions are intact.  The gait is normal.  LABORATORY DATA: ECG today demonstrates normal sinus rhythm with occasional PVCs. He has a chronic left bundle branch block.  Assessment / Plan: 1. Coronary disease with remote coronary bypass surgery in 1996. Cardiac catheterization 2007 showed patent grafts. Myoview study in 2009 showed no ischemia. We  discussed repeating a Myoview study at this time but he does not want to pursue stress testing. He states he feels well and that the testis too expensive. He is to let me know if he notices any change in the pattern of his chest pain.  2. Chronic chest pain. Symptoms are consistent with a GI source. No improvement with Ranexa. He does get relief with sublingual nitroglycerin. Continue Protonix. Continue to avoid foods that exacerbate his symptoms.  3. Left bundle branch block  4. Hyperlipidemia. We'll check fasting lab work today on Lipitor.  5. Obesity. Excellent job with weight loss.

## 2012-08-03 NOTE — Addendum Note (Signed)
Addended by: Awilda Bill on: 08/03/2012 09:49 AM   Modules accepted: Orders

## 2012-10-17 ENCOUNTER — Other Ambulatory Visit: Payer: Self-pay | Admitting: Cardiology

## 2012-10-17 NOTE — Telephone Encounter (Signed)
Sent to [employer] for review

## 2012-10-21 NOTE — Telephone Encounter (Signed)
Spoke to patient he stated he wanted a refill on zpac just in case he gets sick.Will check with Dr.Jordan next week and call him back.

## 2012-10-27 NOTE — Telephone Encounter (Signed)
Spoke to Dr.Jordan unable to prescribe zpac.Need to call PCP.

## 2012-12-25 ENCOUNTER — Other Ambulatory Visit: Payer: Self-pay | Admitting: Cardiology

## 2012-12-27 ENCOUNTER — Other Ambulatory Visit: Payer: Self-pay

## 2012-12-27 MED ORDER — NITROGLYCERIN 0.4 MG SL SUBL: SUBLINGUAL_TABLET | SUBLINGUAL | Status: DC

## 2013-01-10 ENCOUNTER — Other Ambulatory Visit: Payer: Self-pay | Admitting: Cardiology

## 2013-01-18 ENCOUNTER — Other Ambulatory Visit: Payer: Self-pay | Admitting: Cardiology

## 2013-01-19 ENCOUNTER — Other Ambulatory Visit: Payer: Self-pay | Admitting: Cardiology

## 2013-01-26 ENCOUNTER — Encounter: Payer: Self-pay | Admitting: Cardiology

## 2013-02-10 ENCOUNTER — Ambulatory Visit: Payer: Medicare Other | Admitting: Cardiology

## 2013-03-07 ENCOUNTER — Other Ambulatory Visit: Payer: Self-pay | Admitting: Cardiology

## 2013-03-07 NOTE — Telephone Encounter (Signed)
New Prob    Pt has some questions regarding some of his medications. Please call.

## 2013-03-08 ENCOUNTER — Ambulatory Visit: Payer: Medicare Other | Admitting: Cardiology

## 2013-03-14 ENCOUNTER — Telehealth: Payer: Self-pay | Admitting: Cardiology

## 2013-03-14 DIAGNOSIS — R0789 Other chest pain: Secondary | ICD-10-CM

## 2013-03-14 MED ORDER — NITROGLYCERIN 0.4 MG SL SUBL
0.4000 mg | SUBLINGUAL_TABLET | SUBLINGUAL | Status: DC | PRN
Start: 2013-03-14 — End: 2013-05-25

## 2013-03-14 NOTE — Telephone Encounter (Signed)
New message    Talk about a referral

## 2013-03-14 NOTE — Telephone Encounter (Signed)
Called requesting refill on NTG.  Also wants to know if he could get a referral to GI. States he has been having a lot of CP especially after eating or when he bends over or lies down at night.  Usually takes a NTG with relief but he feels like it is not his heart.  Saw Dr. Oletta Lamas in 2000 and would be OK with seeing him again.  Advised Dr. [name] not in office today but would send him a message and would get back in touch with him.

## 2013-03-14 NOTE — Addendum Note (Signed)
Addended by: Hetty Blend on: 03/14/2013 02:32 PM   Modules accepted: Orders

## 2013-03-14 NOTE — Telephone Encounter (Signed)
OK to refer back to GI.  Violet Cart [name] MD, [facility]

## 2013-03-15 NOTE — Telephone Encounter (Signed)
Spoke to patient he stated he had called wanting refill for NTG.Stated refill was done 03/14/13.

## 2013-03-15 NOTE — Telephone Encounter (Signed)
Patient has appointment with Dr.Edwards 03/22/13.

## 2013-03-27 ENCOUNTER — Other Ambulatory Visit: Payer: Self-pay | Admitting: Gastroenterology

## 2013-04-25 ENCOUNTER — Other Ambulatory Visit: Payer: Self-pay | Admitting: Gastroenterology

## 2013-04-25 DIAGNOSIS — R109 Unspecified abdominal pain: Secondary | ICD-10-CM

## 2013-04-27 ENCOUNTER — Ambulatory Visit
Admission: RE | Admit: 2013-04-27 | Discharge: 2013-04-27 | Disposition: A | Discharge status: Home / Self Care | Payer: Medicare Other | Source: Ambulatory Visit | Attending: Gastroenterology | Admitting: Gastroenterology

## 2013-04-27 DIAGNOSIS — R109 Unspecified abdominal pain: Secondary | ICD-10-CM

## 2013-05-16 ENCOUNTER — Encounter (INDEPENDENT_AMBULATORY_CARE_PROVIDER_SITE_OTHER): Payer: Self-pay | Admitting: General Surgery

## 2013-05-16 ENCOUNTER — Ambulatory Visit (INDEPENDENT_AMBULATORY_CARE_PROVIDER_SITE_OTHER): Payer: Medicare Other | Admitting: General Surgery

## 2013-05-16 VITALS — BP 118/72 | HR 74 | Temp 97.6°F | Resp 14 | Ht 70.0 in | Wt 206.0 lb

## 2013-05-16 DIAGNOSIS — K8020 Calculus of gallbladder without cholecystitis without obstruction: Secondary | ICD-10-CM

## 2013-05-16 DIAGNOSIS — Z87442 Personal history of urinary calculi: Secondary | ICD-10-CM

## 2013-05-16 DIAGNOSIS — K219 Gastro-esophageal reflux disease without esophagitis: Secondary | ICD-10-CM

## 2013-05-16 DIAGNOSIS — K2270 Barrett's esophagus without dysplasia: Secondary | ICD-10-CM

## 2013-05-16 DIAGNOSIS — I2510 Atherosclerotic heart disease of native coronary artery without angina pectoris: Secondary | ICD-10-CM

## 2013-05-16 DIAGNOSIS — Z8679 Personal history of other diseases of the circulatory system: Secondary | ICD-10-CM

## 2013-05-16 NOTE — Patient Instructions (Signed)
Your gallbladder ultrasound shows numerous gallstones, so you clearly have a diseased gallbladder.  Your right anterior chest pain is consistent with gallbladder attacks, being severe and intermittent, brought on by food, but it is atypical to be relieved by nitroglycerin.  Because of your history of cardiac disease, keep your appt. With Dr. Peter [name] for cardiac clearance for gallbladder surgery. If Dr. [name] states that the chest pain is not due to your heart, then I think the next step in your care is gallbladder surgery.  I do not think that kidney stones are causing this pain.  However, because you report an episode of hematuria recently which resolved, you should see your urologist, Dr. Irine Seal, in the near future.  Please call Dr. Darrel Hoover office back when you are ready to schedule your gallbladder surgery.       Laparoscopic Cholecystectomy Laparoscopic cholecystectomy is surgery to remove the gallbladder. The gallbladder is located in the upper right part of the abdomen, behind the liver. It is a storage sac for bile produced in the liver. Bile aids in the digestion and absorption of fats. Cholecystectomy is often done for inflammation of the gallbladder (cholecystitis). This condition is usually caused by a buildup of gallstones (cholelithiasis) in your gallbladder. Gallstones can block the flow of bile, resulting in inflammation and pain. In severe cases, emergency surgery may be required. When emergency surgery is not required, you will have time to prepare for the procedure. Laparoscopic surgery is an alternative to open surgery. Laparoscopic surgery has a shorter recovery time. Your common bile duct may also need to be examined during the procedure. If stones are found in the common bile duct, they may be removed. LET [facility] CARE PROVIDER KNOW ABOUT:  Any allergies you have.  All medicines you are taking, including vitamins, herbs, eye drops, creams, and  over-the-counter medicines.  Previous problems you or members of your family have had with the use of anesthetics.  Any blood disorders you have.  Previous surgeries you have had.  Medical conditions you have. RISKS AND COMPLICATIONS Generally, this is a safe procedure. However, as with any procedure, complications can occur. Possible complications include:  Infection.  Damage to the common bile duct, nerves, arteries, veins, or other internal organs such as the stomach, liver, or intestines.  Bleeding.  A stone may remain in the common bile duct.  A bile leak from the cyst duct that is clipped when your gallbladder is removed.  The need to convert to open surgery, which requires a larger incision in the abdomen. This may be necessary if your surgeon thinks it is not safe to continue with a laparoscopic procedure. BEFORE THE PROCEDURE  Ask your health care provider about changing or stopping any regular medicines. You will need to stop taking aspirin or blood thinners at least 5 days prior to surgery.  Do not eat or drink anything after midnight the night before surgery.  Let your health care provider know if you develop a cold or other infectious problem before surgery. PROCEDURE   You will be given medicine to make you sleep through the procedure (general anesthetic). A breathing tube will be placed in your mouth.  When you are asleep, your surgeon will make several small cuts (incisions) in your abdomen.  A thin, lighted tube with a tiny camera on the end (laparoscope) is inserted through one of the small incisions. The camera on the laparoscope sends a picture to a TV screen in the operating room.  This gives the surgeon a good view inside your abdomen.  A gas will be pumped into your abdomen. This expands your abdomen so that the surgeon has more room to perform the surgery.  Other tools needed for the procedure are inserted through the other incisions. The gallbladder is  removed through one of the incisions.  After the removal of your gallbladder, the incisions will be closed with stitches, staples, or skin glue. AFTER THE PROCEDURE  You will be taken to a recovery area where your progress will be checked often.  You may be allowed to go home the same day if your pain is controlled and you can tolerate liquids. Document Released: 12/29/2004 Document Revised: 10/19/2012 Document Reviewed: 08/10/2012 [facility] Patient Information 2014 [place].

## 2013-05-16 NOTE — Progress Notes (Addendum)
Patient ID: [name], male   DOB: 05/12/1945, 68 y.o.   MRN: [id number]  Chief Complaint  Patient presents with  . New Evaluation    eval gallstones    HPI [name] is a 68 y.o. male.  He is referred by Dr. Laurence Spates for evaluation of right anterior chest pain, nausea, and gallstones. Dr. Peter [name] is his cardiologist and essentially his PCP. Dr. Irine Seal is his urologist.  This gentleman has had coronary artery bypass grafting surgery in 1996. He has GERD and Barrett esophagus but no hiatal hernia, apparently. An ultrasound in 2010 shows gallstones. He does not remember why this was done. For the past 2 years he has intermittent episodes of sharp, severe right-sided anterior chest pain just lateral to the nipple about 3 cm above the costal margin. It does not radiate to his back but he does have chronic lumbar back pain. This pain is not exertional. It will sometimes occur after a heavy meal and is associated with nausea. He does not vomit. He does have reflux symptoms. He was recently placed on double dose proton pump inhibitors for 2 weeks and says it did not change his episodes of pain. Bowel movements are normal.Interestingly, he says that it hurts so much  that it will make his teeth hurt.   Interestingly, he says that it will resolve with NTG  but is clearly not exertional. He says that Dr. [name] is aware of this and that he saw him a year ago and has an appointment in a few weeks for routine followup.  Recent ultrasound shows multiple gallstones, 3 mm wall thickness, CBD 3.1 mm. Recent upper endoscopy shows short segment Barrett's esophagus, biopsy shows no dysplasia, normal stomach and duodenum.  He also offered that he had a brief episode of blood in his urine which has resolved this past weekend. I told him he should have that evaluated by Dr. Irine Seal.   He is generally active and spends a lot of time outdoors. He has a house at [employer].  Comorbidities  include coronary artery disease. History of cardiac surgery 1996. Barrett's esophagus without dysplasia. GERD. History of kidney stones. History of colon polyps. He is on aspirin, beta blockers, antihypertensives, isosorbide, statin drugs HPI  Past Medical History  Diagnosis Date  . Coronary artery disease   . FH: CABG (coronary artery bypass surgery) 1996  . History of obesity   . Hypercholesterolemia   . History of hypertension   . History of hiatal hernia   . History of kidney stones   . History of erectile dysfunction   . Gastroesophageal reflux disease   . Sleep apnea   . History of prostatitis     Past Surgical History  Procedure Laterality Date  . Coronary artery bypass graft  1996  . Cardiac catheterization      Ejection fraction is estimated at 45%    Family History  Problem Relation Age of Onset  . Heart attack Father   . Cancer Sister     Uterine Cancer    Social History History  Substance Use Topics  . Smoking status: Former Smoker    Quit date: 05/17/1994  . Smokeless tobacco: Not on file  . Alcohol Use: Yes     Comment: occ    No Known Allergies  Current Outpatient Prescriptions  Medication Sig Dispense Refill  . Ascorbic Acid (VITAMIN C PO) Take by mouth daily.      . ASPIRIN PO  Take by mouth daily.      Marland Kitchen atorvastatin (LIPITOR) 80 MG tablet Take 1 tablet (80 mg total) by mouth daily.  90 tablet  3  . Cholecalciferol (VITAMIN D PO) Take by mouth daily.      . finasteride (PROSCAR) 5 MG tablet Take 1 tablet (5 mg total) by mouth daily.  30 tablet    . isosorbide mononitrate (IMDUR) 60 MG 24 hr tablet Take 1 tablet (60 mg total) by mouth daily.  90 tablet  3  . metoprolol succinate (TOPROL-XL) 50 MG 24 hr tablet Take 1 tablet (50 mg total) by mouth daily. Take with or immediately following a meal.  90 tablet  3  . nitroGLYCERIN (NITROSTAT) 0.4 MG SL tablet Place 1 tablet (0.4 mg total) under the tongue every 5 (five) minutes as needed for chest pain.   100 tablet  3  . oxybutynin (DITROPAN) 5 MG tablet       . pantoprazole (PROTONIX) 40 MG tablet Take 1 tablet (40 mg total) by mouth daily.  90 tablet  3  . ramipril (ALTACE) 5 MG capsule Take 1 capsule (5 mg total) by mouth daily.  90 capsule  3  . valACYclovir (VALTREX) 500 MG tablet        No current facility-administered medications for this visit.    Review of Systems Review of Systems  Constitutional: Negative for fever, chills and unexpected weight change.  HENT: Negative for congestion, hearing loss, sore throat, trouble swallowing and voice change.   Eyes: Negative for visual disturbance.  Respiratory: Negative for cough and wheezing.   Cardiovascular: Positive for chest pain. Negative for palpitations and leg swelling.  Gastrointestinal: Positive for nausea. Negative for vomiting, abdominal pain, diarrhea, constipation, blood in stool, abdominal distention, anal bleeding and rectal pain.  Genitourinary: Positive for hematuria. Negative for difficulty urinating.  Musculoskeletal: Positive for back pain. Negative for arthralgias.  Skin: Negative for rash and wound.  Neurological: Negative for seizures, syncope, weakness and headaches.  Hematological: Negative for adenopathy. Does not bruise/bleed easily.  Psychiatric/Behavioral: Negative for confusion.    Blood pressure 118/72, pulse 74, temperature 97.6 F (36.4 C), temperature source Temporal, resp. rate 14, height 5\' 10"  (1.778 m), weight 206 lb (93.441 kg).  Physical Exam Physical Exam  Constitutional: He is oriented to person, place, and time. He appears well-developed and well-nourished. No distress.  Alert. He appears fit and active. Good tan  HENT:  Head: Normocephalic.  Nose: Nose normal.  Mouth/Throat: No oropharyngeal exudate.  Eyes: Conjunctivae and EOM are normal. Pupils are equal, round, and reactive to light. Right eye exhibits no discharge. Left eye exhibits no discharge. No scleral icterus.  Neck: Normal  range of motion. Neck supple. No JVD present. No tracheal deviation present. No thyromegaly present.  Cardiovascular: Normal rate, regular rhythm, normal heart sounds and intact distal pulses.   No murmur heard. Well-healed sternotomy scar. Soft systolic murmur. No arrhythmia or ectopy. No tenderness of sternum or ribs on the right side. No skin rash.  Pulmonary/Chest: Effort normal and breath sounds normal. No stridor. No respiratory distress. He has no wheezes. He has no rales. He exhibits no tenderness.  Abdominal: Soft. Bowel sounds are normal. He exhibits no distension and no mass. There is no tenderness. There is no rebound and no guarding.  Benign exam. Liver not enlarged. No mass. No hernia. No tenderness.  Musculoskeletal: Normal range of motion. He exhibits no edema and no tenderness.  Lymphadenopathy:    He has  no cervical adenopathy.  Neurological: He is alert and oriented to person, place, and time. He has normal reflexes. Coordination normal.  Skin: Skin is warm and dry. No rash noted. He is not diaphoretic. No erythema. No pallor.  Psychiatric: He has a normal mood and affect. His behavior is normal. Judgment and thought content normal.    Data Reviewed Dr. Oletta Lamas notes and EGD report.  Marland Kitchen Ultrasound reports.  Assessment    Nonexertional, postprandial right anterior chest pain and gallstones. I think that it is likely that he is having gallbladder attacks, although the relief with nitroglycerin is somewhat atypical.  Coronary Artery disease, history CABG 1996  History nephrolithiasis  Recent history hematuria, resolved  Barrett's esophagus without dysplasia  GERD  Hyperlipidemia  History: colon Polyps     Plan    I told the patient that most likely he would need to have a gallbladder operation, but that would we would need to be sure that there was no evidence of coronary ischemia first. He agrees.  He will see Peter [name] in the next 2 or 3 weeks and will  then call me with a decision about gallbladder surgery. He seems interested in going ahead with the surgery if the cardiac issues seem to be cleared up.  ADDENDUM: (06/29/2015)-Myoview shows a chronic lateral wall infarct. No ischemia. No change from prior study. He is cleared for cholecystectomy. Follow up one year. Peter [name] MD, [facility]   He will eventually be scheduled for laparoscopic cholecystectomy with cholangiogram, possible open cholecystectomy. I discussed the indications, details, techniques, and numerous risks of the surgery with him. Abdomen the patient information booklet with him. He understands all these issues. At this time all of his questions were answered. He agrees with this plan.  Because of his reported hematuria, I advised him to make an appointment to see Dr. Irine Seal and to make sure that no other urologic problems are at play.  Addendum: (06/01/2013)  He saw Dr. Jeffie Pollock 05/24/2013.    UA was negative for blood.  He advised cystoscopy after GB removed.   I do not think his right anterior chest pain is due to a urologic cause, however        Edsel Petrin. Dalbert Batman, M.D., [facility] General and Minimally invasive Surgery Breast and Colorectal Surgery Office:   [phone] Pager:   [phone]  05/16/2013, 10:26 AM

## 2013-05-25 ENCOUNTER — Ambulatory Visit (INDEPENDENT_AMBULATORY_CARE_PROVIDER_SITE_OTHER): Payer: Medicare Other | Admitting: Cardiology

## 2013-05-25 ENCOUNTER — Encounter: Payer: Self-pay | Admitting: Cardiology

## 2013-05-25 VITALS — BP 98/52 | HR 62 | Ht 70.0 in | Wt 204.8 lb

## 2013-05-25 DIAGNOSIS — Z8249 Family history of ischemic heart disease and other diseases of the circulatory system: Secondary | ICD-10-CM

## 2013-05-25 DIAGNOSIS — Z8679 Personal history of other diseases of the circulatory system: Secondary | ICD-10-CM

## 2013-05-25 DIAGNOSIS — E7800 Pure hypercholesterolemia, unspecified: Secondary | ICD-10-CM

## 2013-05-25 DIAGNOSIS — I2510 Atherosclerotic heart disease of native coronary artery without angina pectoris: Secondary | ICD-10-CM

## 2013-05-25 DIAGNOSIS — I447 Left bundle-branch block, unspecified: Secondary | ICD-10-CM

## 2013-05-25 MED ORDER — RAMIPRIL 5 MG PO CAPS: 5.0000 mg | ORAL_CAPSULE | Freq: Every day | ORAL | Status: DC

## 2013-05-25 MED ORDER — PANTOPRAZOLE SODIUM 40 MG PO TBEC: 40.0000 mg | DELAYED_RELEASE_TABLET | Freq: Every day | ORAL | Status: DC

## 2013-05-25 MED ORDER — ATORVASTATIN CALCIUM 80 MG PO TABS: 80.0000 mg | ORAL_TABLET | Freq: Every day | ORAL | Status: DC

## 2013-05-25 MED ORDER — NITROGLYCERIN 0.4 MG SL SUBL: 0.4000 mg | SUBLINGUAL_TABLET | SUBLINGUAL | Status: DC | PRN

## 2013-05-25 MED ORDER — ISOSORBIDE MONONITRATE ER 60 MG PO TB24: 60.0000 mg | ORAL_TABLET | Freq: Every day | ORAL | Status: DC

## 2013-05-25 MED ORDER — FINASTERIDE 5 MG PO TABS: 5.0000 mg | ORAL_TABLET | Freq: Every day | ORAL | Status: DC

## 2013-05-25 MED ORDER — METOPROLOL SUCCINATE ER 50 MG PO TB24: 50.0000 mg | ORAL_TABLET | Freq: Every day | ORAL | Status: DC

## 2013-05-25 NOTE — Progress Notes (Signed)
[name] Date of Birth: 08-28-1945 Medical Record #[id number]  History of Present Illness: Olon is seen for followup today. He has a known history of coronary disease and is status post CABG in 1996. This included an LIMA graft to the LAD and diagonal, sequential vein graft to the acute marginal, PDA, and posterior lateral branches of the right coronary, sequential saphenous vein graft to the obtuse marginal vessel and distal circumflex. He had a cardiac catheterization in 2007 which showed that his grafts were all patent. He ha chronic chest pain. This does respond to nitrate therapy. His pain is described as a right precordial pain. It only occurs when he is lying down or if he eats certain foods such as garlic, onions, or acidic foods. He continues to exercise daily and does heavy yard work without difficulty.  He is due for fasting lab work. His last Myoview study in 2009 showed a lateral wall scar with no ischemia. Ejection fraction was 38%. Echocardiogram at the same time showed an ejection fraction of 50-55%. Recently he was diagnosed with multiple gallstones and is considering having surgery later this year.  Current Outpatient Prescriptions on File Prior to Visit  Medication Sig Dispense Refill  . Ascorbic Acid (VITAMIN C PO) Take by mouth daily.      . ASPIRIN PO Take by mouth daily.      . Cholecalciferol (VITAMIN D PO) Take by mouth daily.      Marland Kitchen oxybutynin (DITROPAN) 5 MG tablet       . valACYclovir (VALTREX) 500 MG tablet        No current facility-administered medications on file prior to visit.    No Known Allergies  Past Medical History  Diagnosis Date  . Coronary artery disease   . FH: CABG (coronary artery bypass surgery) 1996  . History of obesity   . Hypercholesterolemia   . History of hypertension   . History of hiatal hernia   . History of kidney stones   . History of erectile dysfunction   . Gastroesophageal reflux disease   . Sleep apnea   . History  of prostatitis     Past Surgical History  Procedure Laterality Date  . Coronary artery bypass graft  1996  . Cardiac catheterization      Ejection fraction is estimated at 45%    History  Smoking status  . Former Smoker  . Quit date: 05/17/1994  Smokeless tobacco  . Not on file    History  Alcohol Use  . Yes    Comment: occ    Family History  Problem Relation Age of Onset  . Heart attack Father   . Cancer Sister     Uterine Cancer    Review of Systems: As noted in HPI.  All other systems were reviewed and are negative.  Physical Exam: BP 98/52  Pulse 62  Ht 5\' 10"  (1.778 m)  Wt 204 lb 12.8 oz (92.897 kg)  BMI 29.39 kg/m2  SpO2 98% He is a pleasant white male in no acute distress.The patient is alert and oriented x 3.    The skin is warm and dry.   The HEENT exam is normal.  The carotids are 2+ without bruits.  There is no thyromegaly.  There is no JVD.  The lungs are clear.  The chest wall is non tender.  The heart exam reveals a regular rate with a normal S1 and S2.  There are no murmurs, gallops, or rubs.  The PMI is not displaced.   Abdominal exam reveals good bowel sounds.  There is no guarding or rebound.  There is no hepatosplenomegaly or tenderness.  There are no masses.  Exam of the legs reveal no clubbing, cyanosis, or edema.  The legs are without rashes.  The distal pulses are intact.  Cranial nerves II - XII are intact.  Motor and sensory functions are intact.  The gait is normal.  LABORATORY DATA: ECG today demonstrates normal sinus rhythm with occasional PVCs. He has a chronic left bundle branch block.  Assessment / Plan: 1. Coronary disease with remote coronary bypass surgery in 1996. Cardiac catheterization 2007 showed patent grafts. Myoview study in 2009 showed no ischemia. I have recommended a follow up Leane Call at this time since he is considering surgery with general anesthesia. If Myoview is OK he will be cleared for surgery.  2. Chronic  chest pain. Symptoms are consistent with a GI source. Prior EGD showed Barrett's esophagus but otherwise no acute findings. This may all be related to his gallstones.  He does get relief with sublingual nitroglycerin. Continue Protonix. Continue to avoid foods that exacerbate his symptoms.  3. Left bundle branch block  4. Hyperlipidemia.  5. Obesity. Excellent job with weight loss.

## 2013-05-25 NOTE — Patient Instructions (Signed)
We will schedule you for a Lexiscan myoview study.  If OK we will clear you for gallbladder surgery

## 2013-06-12 ENCOUNTER — Other Ambulatory Visit: Payer: Self-pay | Admitting: Cardiology

## 2013-06-27 ENCOUNTER — Ambulatory Visit (HOSPITAL_COMMUNITY): Payer: Medicare Other | Attending: Cardiology | Admitting: Radiology

## 2013-06-27 VITALS — BP 139/82 | HR 53 | Ht 70.0 in | Wt 202.0 lb

## 2013-06-27 DIAGNOSIS — R079 Chest pain, unspecified: Secondary | ICD-10-CM

## 2013-06-27 DIAGNOSIS — Z87891 Personal history of nicotine dependence: Secondary | ICD-10-CM | POA: Insufficient documentation

## 2013-06-27 DIAGNOSIS — Z8679 Personal history of other diseases of the circulatory system: Secondary | ICD-10-CM

## 2013-06-27 DIAGNOSIS — I10 Essential (primary) hypertension: Secondary | ICD-10-CM | POA: Insufficient documentation

## 2013-06-27 DIAGNOSIS — E7800 Pure hypercholesterolemia, unspecified: Secondary | ICD-10-CM

## 2013-06-27 DIAGNOSIS — Z951 Presence of aortocoronary bypass graft: Secondary | ICD-10-CM | POA: Insufficient documentation

## 2013-06-27 DIAGNOSIS — Z8249 Family history of ischemic heart disease and other diseases of the circulatory system: Secondary | ICD-10-CM | POA: Insufficient documentation

## 2013-06-27 DIAGNOSIS — I447 Left bundle-branch block, unspecified: Secondary | ICD-10-CM | POA: Insufficient documentation

## 2013-06-27 DIAGNOSIS — I2510 Atherosclerotic heart disease of native coronary artery without angina pectoris: Secondary | ICD-10-CM | POA: Insufficient documentation

## 2013-06-27 MED ORDER — ADENOSINE (DIAGNOSTIC) 3 MG/ML IV SOLN
0.5600 mg/kg | Freq: Once | INTRAVENOUS | Status: AC
  Administered 2013-06-27: 51.3 mg via INTRAVENOUS

## 2013-06-27 MED ORDER — TECHNETIUM TC 99M SESTAMIBI GENERIC - CARDIOLITE
11.0000 | Freq: Once | INTRAVENOUS | Status: AC | PRN
  Administered 2013-06-27: 11 via INTRAVENOUS

## 2013-06-27 MED ORDER — TECHNETIUM TC 99M SESTAMIBI GENERIC - CARDIOLITE
33.0000 | Freq: Once | INTRAVENOUS | Status: AC | PRN
  Administered 2013-06-27: 33 via INTRAVENOUS

## 2013-06-27 NOTE — Progress Notes (Signed)
[place] [address] [place], [place] [address] [phone]    Cardiology Nuclear Med Study  [name] is a 68 y.o. male     MRN : [id number]     DOB: [birth year]  Procedure Date: 06/27/2013  Nuclear Med Background Indication for Stress Test:  Evaluation for Ischemia, Surgical Clearance:Gallbladder surgery by Dr. Fanny Skates and Graft Patency History:  CAD; CABG; 2009 Echo- 50-55%; 2009 MPI-lateral scar, EF 38% Cardiac Risk Factors: Family History - CAD, History of Smoking, Hypertension, LBBB and Lipids  Symptoms:  Chest Pain (chronic) at rest   Nuclear Pre-Procedure Caffeine/Decaff Intake:  None NPO After: 6:00pm   Lungs:  clear O2 Sat: 95% on room air. IV 0.9% NS with Angio Cath:  22g  IV Site: R Hand  IV Started by:  Matilde Haymaker, RN  Chest Size (in):  42 Cup Size: n/a  Height: 5\' 10"  (1.778 m)  Weight:  202 lb (91.627 kg)  BMI:  Body mass index is 28.98 kg/(m^2). Tech Comments:  toprol taken 0600    Nuclear Med Study 1 or 2 day study: 1 day  Stress Test Type:  Adenosine  Reading MD: Candee Furbish, MD  Order Authorizing Provider:  P. [name], MD  Resting Radionuclide: Technetium 57m Sestamibi  Resting Radionuclide Dose: 11.0 mCi   Stress Radionuclide:  Technetium 34m Sestamibi  Stress Radionuclide Dose: 33.0 mCi           Stress Protocol Rest HR: 53 Stress HR: 64  Rest BP: 139/82 Stress BP: 138/100  Exercise Time (min): n/a METS: n/a           Dose of Adenosine (mg):  51.4 mg Dose of Lexiscan: n/a mg  Dose of Atropine (mg): n/a Dose of Dobutamine: n/a mcg/kg/min (at max HR)  Stress Test Technologist: Glade Lloyd, BS-ES  Nuclear Technologist:  Charlton Amor, CNMT     Rest Procedure:  Myocardial perfusion imaging was performed at rest 45 minutes following the intravenous administration of Technetium 60m Sestamibi. Rest ECG: Sinus rhythm, 53, left bundle branch block, frequent PVCs  Stress Procedure:  The  patient received IV adenosine at 140 mcg/kg/min for 4 minutes.  Technetium 10m Sestamibi was injected at the 2 minute mark and quantitative spect images were obtained after a 45 minute delay.  During the infusion of Adenosine the patient experienced some chest tightness.  This resolved in recovery.  Stress ECG: No significant change from baseline ECG  QPS Raw Data Images:  Normal; no motion artifact; normal heart/lung ratio. Stress Images:  Large area of decreased uptake along the entire lateral wall distribution. This is seen at both rest and stress. There is no area of ischemia identified. Dilated chamber size noted. Rest Images:  As above Subtraction (SDS):  No evidence of ischemia. Transient Ischemic Dilatation (Normal <1.22):  1.08 Lung/Heart Ratio (Normal <0.45):  0.27  Quantitative Gated Spect Images QGS EDV:  NA  QGS ESV:  NA  Impression Exercise Capacity:  Adenosine study with no exercise. BP Response:  Normal blood pressure response. Clinical Symptoms:  Typical symptoms with adenosine ECG Impression:  No significant ST segment change suggestive of ischemia. Comparison with Prior Nuclear Study: No images to compare  Overall Impression:  Intermediate risk stress nuclear study with large area of lateral wall infarction. No evidence of ischemia..  LV Ejection Fraction: Study not gated.  LV Wall Motion:  NA  Candee Furbish, MD

## 2013-07-04 ENCOUNTER — Telehealth: Payer: Self-pay | Admitting: Cardiology

## 2013-07-04 NOTE — Telephone Encounter (Signed)
Returned call to patient [name] results given.Copy sent to Dr.Ingram,Dr.Wrenn,Dr.Edwards.

## 2013-07-04 NOTE — Telephone Encounter (Signed)
New message ° ° ° ° °Want stress test results °

## 2013-07-10 ENCOUNTER — Encounter (INDEPENDENT_AMBULATORY_CARE_PROVIDER_SITE_OTHER): Payer: Self-pay

## 2013-08-08 ENCOUNTER — Other Ambulatory Visit: Payer: Self-pay | Admitting: Cardiology

## 2013-08-11 ENCOUNTER — Other Ambulatory Visit: Payer: Self-pay | Admitting: Cardiology

## 2013-08-11 NOTE — Telephone Encounter (Signed)
Patient is requesting more nitro, but this was filled in May 2015 for a year supply. If this is the case he may be taking them daily. Please advise. Thanks, MI

## 2013-10-05 ENCOUNTER — Other Ambulatory Visit: Payer: Self-pay | Admitting: Cardiology

## 2013-10-26 ENCOUNTER — Other Ambulatory Visit: Payer: Self-pay | Admitting: *Deleted

## 2013-10-26 MED ORDER — NITROGLYCERIN 0.4 MG SL SUBL: 0.4000 mg | SUBLINGUAL_TABLET | SUBLINGUAL | Status: DC | PRN

## 2013-12-28 ENCOUNTER — Other Ambulatory Visit (INDEPENDENT_AMBULATORY_CARE_PROVIDER_SITE_OTHER): Payer: Self-pay | Admitting: General Surgery

## 2014-01-15 ENCOUNTER — Other Ambulatory Visit: Payer: Self-pay | Admitting: Cardiology

## 2014-01-16 ENCOUNTER — Other Ambulatory Visit: Payer: Self-pay | Admitting: *Deleted

## 2014-01-16 ENCOUNTER — Telehealth: Payer: Self-pay | Admitting: Cardiology

## 2014-01-16 MED ORDER — NITROGLYCERIN 0.4 MG SL SUBL: 0.4000 mg | SUBLINGUAL_TABLET | SUBLINGUAL | Status: DC | PRN

## 2014-01-16 NOTE — Telephone Encounter (Signed)
[name] is calling to get his Nitro Stat refilled .Marland Kitchen Send to [facility] on [address] .Marland Kitchen Please call  Thanks

## 2014-01-16 NOTE — Telephone Encounter (Signed)
Refill done on ntg

## 2014-01-17 ENCOUNTER — Telehealth: Payer: Self-pay

## 2014-01-17 MED ORDER — NITROGLYCERIN 0.4 MG SL SUBL: 0.4000 mg | SUBLINGUAL_TABLET | SUBLINGUAL | Status: DC | PRN

## 2014-01-17 NOTE — Telephone Encounter (Signed)
Received a call from patient requesting 100 tablets of NTG instead of 25 tablets.Refill sent to pharmacy.

## 2014-01-18 ENCOUNTER — Encounter (HOSPITAL_COMMUNITY)
Admission: RE | Admit: 2014-01-18 | Discharge: 2014-01-18 | Disposition: A | Discharge status: Home / Self Care | Payer: Medicare Other | Source: Ambulatory Visit | Attending: General Surgery | Admitting: General Surgery

## 2014-01-18 ENCOUNTER — Encounter (HOSPITAL_COMMUNITY): Payer: Self-pay

## 2014-01-18 DIAGNOSIS — N200 Calculus of kidney: Secondary | ICD-10-CM | POA: Diagnosis not present

## 2014-01-18 DIAGNOSIS — I519 Heart disease, unspecified: Secondary | ICD-10-CM | POA: Insufficient documentation

## 2014-01-18 DIAGNOSIS — R079 Chest pain, unspecified: Secondary | ICD-10-CM | POA: Diagnosis not present

## 2014-01-18 DIAGNOSIS — I493 Ventricular premature depolarization: Secondary | ICD-10-CM | POA: Diagnosis not present

## 2014-01-18 DIAGNOSIS — I2510 Atherosclerotic heart disease of native coronary artery without angina pectoris: Secondary | ICD-10-CM | POA: Insufficient documentation

## 2014-01-18 DIAGNOSIS — K219 Gastro-esophageal reflux disease without esophagitis: Secondary | ICD-10-CM | POA: Insufficient documentation

## 2014-01-18 DIAGNOSIS — Z951 Presence of aortocoronary bypass graft: Secondary | ICD-10-CM | POA: Insufficient documentation

## 2014-01-18 DIAGNOSIS — Z01818 Encounter for other preprocedural examination: Secondary | ICD-10-CM | POA: Diagnosis present

## 2014-01-18 DIAGNOSIS — N529 Male erectile dysfunction, unspecified: Secondary | ICD-10-CM | POA: Diagnosis not present

## 2014-01-18 DIAGNOSIS — E780 Pure hypercholesterolemia: Secondary | ICD-10-CM | POA: Insufficient documentation

## 2014-01-18 DIAGNOSIS — N419 Inflammatory disease of prostate, unspecified: Secondary | ICD-10-CM | POA: Diagnosis not present

## 2014-01-18 DIAGNOSIS — I10 Essential (primary) hypertension: Secondary | ICD-10-CM | POA: Diagnosis not present

## 2014-01-18 DIAGNOSIS — Z87891 Personal history of nicotine dependence: Secondary | ICD-10-CM | POA: Insufficient documentation

## 2014-01-18 DIAGNOSIS — G4733 Obstructive sleep apnea (adult) (pediatric): Secondary | ICD-10-CM | POA: Insufficient documentation

## 2014-01-18 DIAGNOSIS — K449 Diaphragmatic hernia without obstruction or gangrene: Secondary | ICD-10-CM | POA: Diagnosis not present

## 2014-01-18 DIAGNOSIS — G8929 Other chronic pain: Secondary | ICD-10-CM | POA: Insufficient documentation

## 2014-01-18 DIAGNOSIS — I447 Left bundle-branch block, unspecified: Secondary | ICD-10-CM | POA: Diagnosis not present

## 2014-01-18 HISTORY — DX: Essential (primary) hypertension: I10

## 2014-01-18 LAB — CBC WITH DIFFERENTIAL/PLATELET
Basophils Absolute: 0 10*3/uL (ref 0.0–0.1)
Basophils Relative: 0 % (ref 0–1)
EOS ABS: 0.1 10*3/uL (ref 0.0–0.7)
EOS PCT: 2 % (ref 0–5)
HCT: 42.8 % (ref 39.0–52.0)
Hemoglobin: 14.3 g/dL (ref 13.0–17.0)
Lymphocytes Relative: 15 % (ref 12–46)
Lymphs Abs: 1.1 10*3/uL (ref 0.7–4.0)
MCH: 29.5 pg (ref 26.0–34.0)
MCHC: 33.4 g/dL (ref 30.0–36.0)
MCV: 88.4 fL (ref 78.0–100.0)
Monocytes Absolute: 0.5 10*3/uL (ref 0.1–1.0)
Monocytes Relative: 7 % (ref 3–12)
Neutro Abs: 5.9 10*3/uL (ref 1.7–7.7)
Neutrophils Relative %: 76 % (ref 43–77)
PLATELETS: 206 10*3/uL (ref 150–400)
RBC: 4.84 MIL/uL (ref 4.22–5.81)
RDW: 12.8 % (ref 11.5–15.5)
WBC: 7.6 10*3/uL (ref 4.0–10.5)

## 2014-01-18 LAB — COMPREHENSIVE METABOLIC PANEL
ALT: 36 U/L (ref 0–53)
ANION GAP: 6 (ref 5–15)
AST: 44 U/L — ABNORMAL HIGH (ref 0–37)
Albumin: 4 g/dL (ref 3.5–5.2)
Alkaline Phosphatase: 82 U/L (ref 39–117)
BILIRUBIN TOTAL: 1.9 mg/dL — AB (ref 0.3–1.2)
BUN: 22 mg/dL (ref 6–23)
CHLORIDE: 104 meq/L (ref 96–112)
CO2: 27 mmol/L (ref 19–32)
CREATININE: 0.83 mg/dL (ref 0.50–1.35)
Calcium: 9.1 mg/dL (ref 8.4–10.5)
GFR, EST NON AFRICAN AMERICAN: 87 mL/min — AB (ref >90)
GLUCOSE: 114 mg/dL — AB (ref 70–99)
Potassium: 3.7 mmol/L (ref 3.5–5.1)
Sodium: 137 mmol/L (ref 135–145)
Total Protein: 6.6 g/dL (ref 6.0–8.3)

## 2014-01-18 NOTE — Progress Notes (Signed)
Pt saw Dr [name] for cardiac clearance.I will also review with Shelby Dubin

## 2014-01-18 NOTE — Pre-Procedure Instructions (Signed)
[name]  01/18/2014   Your procedure is scheduled on:  01/23/14  Report to [facility] Admitting at 10 AM.  Call this number if you have problems the morning of surgery: [phone]   Remember:   Do not eat food or drink liquids after midnight.   Take these medicines the morning of surgery with A SIP OF WATER: proscar,toprol,imdur,protonix   Do not wear jewelry, make-up or nail polish.  Do not wear lotions, powders, or perfumes. You may wear deodorant.  Do not shave 48 hours prior to surgery. Men may shave face and neck.  Do not bring valuables to the hospital.  [facility] is not responsible                  for any belongings or valuables.               Contacts, dentures or bridgework may not be worn into surgery.  Leave suitcase in the car. After surgery it may be brought to your room.  For patients admitted to the hospital, discharge time is determined by your                treatment team.               Patients discharged the day of surgery will not be allowed to drive  home.  Name and phone number of your driver:   Special Instructions: Incentive Spirometry - Practice and bring it with you on the day of surgery.   Please read over the following fact sheets that you were given: Pain Booklet, Coughing and Deep Breathing and Surgical Site Infection Prevention

## 2014-01-19 NOTE — Progress Notes (Signed)
Anesthesia Chart Review:  Patient is a 69 year old male scheduled for laparoscopic cholecystectomy on 01/23/14 by Dr. Dalbert Batman.  History includes former smoker, CAD s/p CABG '96 with chronic chest pain responsive to nitrate therapy, hypercholesterolemia, HTN, GERD, hiatal hernia, ED, prostatitis, OSA, nephrolithiasis, colon surgery. Cardiologist is Dr. [name].   Nuclear stress test on 06/27/13: Overall Impression: Intermediate risk stress nuclear study with large area of lateral wall infarction. No evidence of ischemia. LV Ejection Fraction: Study not gated. LV Wall Motion: NA. Results were reviewed by Dr. [name] who felt the results were unchanged from prior study, and cleared patient for cholecystectomy. One year follow-up recommended.  EKG 01/18/14: SR with first degree AVB with occasional PVC, possible LAE, left BBB. He had a left BBB and PVCs on his previous EKG from 08/03/12.  06/10/07 echo showed: Moderate LVH with low normal systolic function, AV sclerosis without stenosis, mild LAE. EF 50-55%.  Cardiac cath 02/03/05: 1. Severe three-vessel obstructive atherosclerotic coronary artery disease. 2. All grafts were patent including saphenous vein graft to the right coronary artery marginal, right ventricular marginal, posterior descending artery and posterolateral branches to the right coronary artery. Saphenous vein graft sequentially to the second and third obtuse marginal vessels and left internal mammary artery graft to the left anterior descending artery. 3. Mild left ventricular dysfunction. 4. Comparison made with no prior diagnostic cath in January 2004. The major differences the first diagonal branch is now subtotally occluded. This is small-caliber vessel less than 1 mm diameter. It is not suitable for intervention. The occlusion of the second marginal vessel was old. PLAN: Would recommend continued medical therapy.  Preoperative labs noted.  Total bilirubin was 1.9, previously 1.8 on  08/03/12.  AST mildly elevated at 44, ALT normal at 36.  CBC WNL.   According to Dr. Doug Sou notes, patient with known CAD and chronic chest pain that seems consistent with GI etiology but is Nitro responsive. He recommended a stress test that showed scar but no ischemia (unchanged from prior).  He cleared patient for this procedure. If no acute changes then I would anticipate that he could proceed as planned.   George [facility] Short Stay Center/Anesthesiology Phone [phone] 01/19/2014 10:58 AM

## 2014-01-21 NOTE — H&P (Signed)
[name]  Location: [facility] Patient #: [id number] DOB: [birth year] Married / Language: English / Race: White Male      History of Present Illness  Patient words: discuss GB sx.  The patient is a 69 year old male who presents for evaluation of gall stones. This gentleman  returns to see me to discuss cholecystectomy. This is a 69 year old man who I last saw on June 28, 2013. He's been having attacks of centralized abdominal pain, typically in the evening which last about an hour. Mild nausea but no vomiting. He also has some atypical right lower anterior chest pain which Is almost daily. He's had recent thorough cardiac evaluation by Dr. Peter [name]. His Myoview looks good with chronic lateral wall infarct but no ischemia. Dr. [name] has cleared him for surgery. He states that his recent upper endoscopy was negative. He has known Barrett's esophagus. He's followed by Dr. Irine Seal for hematuria. He is ready to have his gallbladder out. Recall that his gallbladder ultrasound shows multiple gallstones, 3 mm wall thickness, CBD 3.1 mm. It is notable that he can work hard all day in the yard and gives no pain. He has a house at [employer]. comorbidities include coronary artery disease, history of cardiac surgery 1996, Barrett's esophagus without dysplasia, GERD, history kidney stones, history: Polyps.  He is on aspirin, beta blockers, antihypertensive, isosorbide, statin drugs. I told him to continue all of his medications but stop his aspirin 2 days preop and I discussed the indications, details, techniques, and numerous risk of cholecystectomy with him. He is aware of the risk of bleeding, infection, conversion to open laparotomy, bile leak, wound hernia, injury to adjacent organs with major reconstructive surgery, cardiac, pulmonary, and thromboembolic problems. He understands all these issues well. At this time all his questions were answered. He agrees with this  plan.   Other Problems  Chest pain Gastroesophageal Reflux Disease High blood pressure Kidney Stone  Past Surgical History  Bypass Surgery for Poor Blood Flow to Legs Colon Polyp Removal - Colonoscopy Coronary Artery Bypass Graft Tonsillectomy Vasectomy  Diagnostic Studies History  Colonoscopy within last year  Allergies  No Known Drug Allergies12/17/2015  Medication History  Atorvastatin Calcium (80MG  Tablet, Oral) Active. Finasteride (5MG  Tablet, Oral) Active. Fluzone High-Dose (0.5ML Susp Pref Syr, Intramuscular) Active. Isosorbide Mononitrate ER (60MG  Tablet ER 24HR, Oral) Active. Metoprolol Succinate ER (50MG  Tablet ER 24HR, Oral) Active. Nitrostat (0.4MG  Tab Sublingual, Sublingual) Active. Oxybutynin Chloride (5MG  Tablet, Oral) Active. Pantoprazole Sodium (40MG  Tablet DR, Oral) Active. Ramipril (5MG  Capsule, Oral) Active. ValACYclovir HCl (500MG  Tablet, Oral) Active.  Social History Alcohol use Occasional alcohol use. Caffeine use Carbonated beverages. No drug use Tobacco use Former smoker.  Family History Heart Disease Father. Hypertension Father. Ovarian Cancer Sister.  Review of Systems  General Not Present- Appetite Loss, Chills, Fatigue, Fever, Night Sweats, Weight Gain and Weight Loss. Skin Not Present- Change in Wart/Mole, Dryness, Hives, Jaundice, New Lesions, Non-Healing Wounds, Rash and Ulcer. HEENT Not Present- Earache, Hearing Loss, Hoarseness, Nose Bleed, Oral Ulcers, Ringing in the Ears, Seasonal Allergies, Sinus Pain, Sore Throat, Visual Disturbances, Wears glasses/contact lenses and Yellow Eyes. Respiratory Present- Snoring. Not Present- Bloody sputum, Chronic Cough, Difficulty Breathing and Wheezing. Breast Not Present- Breast Mass, Breast Pain, Nipple Discharge and Skin Changes. Cardiovascular Present- Chest Pain. Not Present- Difficulty Breathing Lying Down, Leg Cramps, Palpitations, Rapid Heart Rate, Shortness of  Breath and Swelling of Extremities. Gastrointestinal Present- Abdominal Pain and Excessive gas. Not Present- Bloating,  Bloody Stool, Change in Bowel Habits, Chronic diarrhea, Constipation, Difficulty Swallowing, Gets full quickly at meals, Hemorrhoids, Indigestion, Nausea, Rectal Pain and Vomiting. Male Genitourinary Not Present- Blood in Urine, Change in Urinary Stream, Frequency, Impotence, Nocturia, Painful Urination, Urgency and Urine Leakage. Musculoskeletal Present- Back Pain. Not Present- Joint Pain, Joint Stiffness, Muscle Pain, Muscle Weakness and Swelling of Extremities. Neurological Not Present- Decreased Memory, Fainting, Headaches, Numbness, Seizures, Tingling, Tremor, Trouble walking and Weakness. Psychiatric Not Present- Anxiety, Bipolar, Change in Sleep Pattern, Depression, Fearful and Frequent crying. Endocrine Not Present- Cold Intolerance, Excessive Hunger, Hair Changes, Heat Intolerance, Hot flashes and New Diabetes. Hematology Not Present- Easy Bruising, Excessive bleeding, Gland problems, HIV and Persistent Infections.   Vitals   Weight: 210.5 lb Height: 70in Body Surface Area: 2.17 m Body Mass Index: 30.2 kg/m Temp.: 98.22F  Pulse: 49 (Regular)  BP: 120/60 (Sitting, Left Arm, Standard)    Physical Exam  General Note: Alert. No distress. Very pleasant, cooperative, and oriented.   Head and Neck Note: No adenopathy or mass. Good range of motion   Chest and Lung Exam Note: Lungs are clear to auscultation bilaterally. Well-healed sternotomy scar. Soft systolic murmur. No ectopy. Ribs and sternum nontender   Cardiovascular Note: Regular rate and rhythm. No ectopy. Soft systolic murmur.   Abdomen Note: Soft. Nondistended. No organomegaly. No tenderness. No scars. No hernias.   Neurologic Note: Alert and oriented to person place and time. Normal reflexes. Coordination normal. No gross motor or sensory deficits   Neuropsychiatric Note:  Normal mood and affect. Behavior and judgment normal.   Musculoskeletal Note: Normal gait and ambulation. Normal range of motion. No edema or tenderness.     Assessment & Plan  GALLSTONES (574.20  K80.20) Current Plans  Schedule for Surgery You arecontinuing to have abdominal pain and right lower chest pain. These symptoms are relatively typical for gallbladder attacks, which is consistent with your multiple gallstones. You have been cleared for surgery by Dr. Peter [name] you have recently had an upper endoscopy You're being followed by Dr. Irine Seal for hematuria The next step in your care is to proceed with laparoscopic cholecystectomy, possible open cholecystectomy We have discussed the techniques and risks of this surgery in detail. Stop taking aspirin 2 days prior to surgery We will schedule this in the near future at her convenience.  CAD IN NATIVE ARTERY (414.01  I25.10) Impression: Followed by Dr. Peter [name]  HISTORY OF FOUR VESSEL CORONARY ARTERY BYPASS GRAFT (V15.1  Z95.1)  BARRETT'S ESOPHAGUS DETERMINED BY BIOPSY (530.85  K22.70) Impression: Recent endoscopy performed by Dr. Oletta Lamas.  HYPERLIPIDEMIA, ACQUIRED (272.4  E78.5)  BMI 30.0-30.9,ADULT (V85.30  Z68.30)  HEMATURIA (599.70  R31.9) Impression: Followed by Dr. Thereasa Parkin. Dalbert Batman, M.D., [facility] General and Minimally invasive Surgery Breast and Colorectal Surgery Office:   [phone] Pager:   [phone]

## 2014-01-22 MED ORDER — CEFAZOLIN SODIUM-DEXTROSE 2-3 GM-% IV SOLR
2.0000 g | INTRAVENOUS | Status: AC
  Administered 2014-01-23: 2 g via INTRAVENOUS
  Filled 2014-01-22: qty 50

## 2014-01-23 ENCOUNTER — Encounter (HOSPITAL_COMMUNITY)
Admission: RE | Disposition: A | Discharge status: Home / Self Care | Payer: Self-pay | Source: Ambulatory Visit | Attending: General Surgery

## 2014-01-23 ENCOUNTER — Ambulatory Visit (HOSPITAL_COMMUNITY): Payer: Medicare Other

## 2014-01-23 ENCOUNTER — Ambulatory Visit (HOSPITAL_COMMUNITY): Payer: Medicare Other | Admitting: Certified Registered Nurse Anesthetist

## 2014-01-23 ENCOUNTER — Ambulatory Visit (HOSPITAL_COMMUNITY)
Admission: RE | Admit: 2014-01-23 | Discharge: 2014-01-24 | Disposition: A | Discharge status: Home / Self Care | Payer: Medicare Other | Source: Ambulatory Visit | Attending: General Surgery | Admitting: General Surgery

## 2014-01-23 ENCOUNTER — Encounter (HOSPITAL_COMMUNITY): Payer: Self-pay | Admitting: Certified Registered Nurse Anesthetist

## 2014-01-23 ENCOUNTER — Ambulatory Visit (HOSPITAL_COMMUNITY): Payer: Medicare Other | Admitting: Vascular Surgery

## 2014-01-23 DIAGNOSIS — K8010 Calculus of gallbladder with chronic cholecystitis without obstruction: Secondary | ICD-10-CM | POA: Insufficient documentation

## 2014-01-23 DIAGNOSIS — K2270 Barrett's esophagus without dysplasia: Secondary | ICD-10-CM | POA: Diagnosis not present

## 2014-01-23 DIAGNOSIS — Z86010 Personal history of colonic polyps: Secondary | ICD-10-CM | POA: Insufficient documentation

## 2014-01-23 DIAGNOSIS — Z951 Presence of aortocoronary bypass graft: Secondary | ICD-10-CM | POA: Insufficient documentation

## 2014-01-23 DIAGNOSIS — I2510 Atherosclerotic heart disease of native coronary artery without angina pectoris: Secondary | ICD-10-CM | POA: Insufficient documentation

## 2014-01-23 DIAGNOSIS — Z87891 Personal history of nicotine dependence: Secondary | ICD-10-CM | POA: Insufficient documentation

## 2014-01-23 DIAGNOSIS — R319 Hematuria, unspecified: Secondary | ICD-10-CM | POA: Insufficient documentation

## 2014-01-23 DIAGNOSIS — Z87442 Personal history of urinary calculi: Secondary | ICD-10-CM | POA: Diagnosis not present

## 2014-01-23 DIAGNOSIS — I10 Essential (primary) hypertension: Secondary | ICD-10-CM | POA: Diagnosis not present

## 2014-01-23 DIAGNOSIS — K219 Gastro-esophageal reflux disease without esophagitis: Secondary | ICD-10-CM | POA: Insufficient documentation

## 2014-01-23 DIAGNOSIS — K8020 Calculus of gallbladder without cholecystitis without obstruction: Secondary | ICD-10-CM | POA: Diagnosis present

## 2014-01-23 DIAGNOSIS — E785 Hyperlipidemia, unspecified: Secondary | ICD-10-CM | POA: Insufficient documentation

## 2014-01-23 HISTORY — PX: CHOLECYSTECTOMY: SHX55

## 2014-01-23 SURGERY — LAPAROSCOPIC CHOLECYSTECTOMY WITH INTRAOPERATIVE CHOLANGIOGRAM
Anesthesia: General | Site: Abdomen

## 2014-01-23 MED ORDER — ARTIFICIAL TEARS OP OINT
TOPICAL_OINTMENT | OPHTHALMIC | Status: DC | PRN
  Administered 2014-01-23: 1 via OPHTHALMIC

## 2014-01-23 MED ORDER — NITROGLYCERIN 0.4 MG SL SUBL: 0.4000 mg | SUBLINGUAL_TABLET | SUBLINGUAL | Status: DC | PRN

## 2014-01-23 MED ORDER — CEFAZOLIN SODIUM 1-5 GM-% IV SOLN
1.0000 g | Freq: Three times a day (TID) | INTRAVENOUS | Status: AC
  Administered 2014-01-23 – 2014-01-24 (×2): 1 g via INTRAVENOUS
  Filled 2014-01-23 (×2): qty 50

## 2014-01-23 MED ORDER — BUPIVACAINE-EPINEPHRINE (PF) 0.5% -1:200000 IJ SOLN
INTRAMUSCULAR | Status: AC
  Filled 2014-01-23: qty 30

## 2014-01-23 MED ORDER — RAMIPRIL 5 MG PO CAPS
5.0000 mg | ORAL_CAPSULE | Freq: Every day | ORAL | Status: DC
  Administered 2014-01-23: 5 mg via ORAL
  Filled 2014-01-23 (×2): qty 1

## 2014-01-23 MED ORDER — 0.9 % SODIUM CHLORIDE (POUR BTL) OPTIME
TOPICAL | Status: DC | PRN
  Administered 2014-01-23: 1000 mL

## 2014-01-23 MED ORDER — FENTANYL CITRATE 0.05 MG/ML IJ SOLN
INTRAMUSCULAR | Status: DC | PRN
  Administered 2014-01-23: 50 ug via INTRAVENOUS
  Administered 2014-01-23: 100 ug via INTRAVENOUS
  Administered 2014-01-23 (×2): 50 ug via INTRAVENOUS

## 2014-01-23 MED ORDER — HYDROCODONE-ACETAMINOPHEN 5-325 MG PO TABS: 1.0000 | ORAL_TABLET | Freq: Four times a day (QID) | ORAL | Status: DC | PRN

## 2014-01-23 MED ORDER — ISOSORBIDE MONONITRATE ER 60 MG PO TB24
60.0000 mg | ORAL_TABLET | Freq: Every day | ORAL | Status: DC
  Filled 2014-01-23: qty 1

## 2014-01-23 MED ORDER — HYDROMORPHONE HCL 1 MG/ML IJ SOLN
INTRAMUSCULAR | Status: AC
  Filled 2014-01-23: qty 1

## 2014-01-23 MED ORDER — OXYBUTYNIN CHLORIDE 5 MG PO TABS
5.0000 mg | ORAL_TABLET | Freq: Every day | ORAL | Status: DC
  Administered 2014-01-23: 5 mg via ORAL
  Filled 2014-01-23 (×2): qty 1

## 2014-01-23 MED ORDER — NEOSTIGMINE METHYLSULFATE 10 MG/10ML IV SOLN
INTRAVENOUS | Status: AC
  Filled 2014-01-23: qty 1

## 2014-01-23 MED ORDER — ROCURONIUM BROMIDE 100 MG/10ML IV SOLN
INTRAVENOUS | Status: DC | PRN
  Administered 2014-01-23: 50 mg via INTRAVENOUS

## 2014-01-23 MED ORDER — MIDAZOLAM HCL 5 MG/5ML IJ SOLN
INTRAMUSCULAR | Status: DC | PRN
  Administered 2014-01-23: 2 mg via INTRAVENOUS

## 2014-01-23 MED ORDER — PROPOFOL 10 MG/ML IV BOLUS
INTRAVENOUS | Status: DC | PRN
  Administered 2014-01-23: 170 mg via INTRAVENOUS

## 2014-01-23 MED ORDER — LIDOCAINE HCL (CARDIAC) 20 MG/ML IV SOLN
INTRAVENOUS | Status: DC | PRN
Start: 2014-01-23 — End: 2014-01-23
  Administered 2014-01-23: 60 mg via INTRAVENOUS

## 2014-01-23 MED ORDER — LACTATED RINGERS IV SOLN
INTRAVENOUS | Status: DC
  Administered 2014-01-23: 10:00:00 via INTRAVENOUS

## 2014-01-23 MED ORDER — MIDAZOLAM HCL 2 MG/2ML IJ SOLN
INTRAMUSCULAR | Status: AC
  Filled 2014-01-23: qty 2

## 2014-01-23 MED ORDER — FENTANYL CITRATE 0.05 MG/ML IJ SOLN
INTRAMUSCULAR | Status: AC
  Filled 2014-01-23: qty 5

## 2014-01-23 MED ORDER — ONDANSETRON HCL 4 MG/2ML IJ SOLN: 4.0000 mg | Freq: Four times a day (QID) | INTRAMUSCULAR | Status: DC | PRN

## 2014-01-23 MED ORDER — BUPIVACAINE-EPINEPHRINE 0.5% -1:200000 IJ SOLN
INTRAMUSCULAR | Status: DC | PRN
  Administered 2014-01-23: 30 mL

## 2014-01-23 MED ORDER — GLYCOPYRROLATE 0.2 MG/ML IJ SOLN
INTRAMUSCULAR | Status: DC | PRN
  Administered 2014-01-23: 0.2 mg via INTRAVENOUS
  Administered 2014-01-23: 0.6 mg via INTRAVENOUS

## 2014-01-23 MED ORDER — PANTOPRAZOLE SODIUM 40 MG PO TBEC: 40.0000 mg | DELAYED_RELEASE_TABLET | Freq: Every day | ORAL | Status: DC

## 2014-01-23 MED ORDER — GLYCOPYRROLATE 0.2 MG/ML IJ SOLN
INTRAMUSCULAR | Status: AC
  Filled 2014-01-23: qty 3

## 2014-01-23 MED ORDER — VALACYCLOVIR HCL 500 MG PO TABS
500.0000 mg | ORAL_TABLET | Freq: Every day | ORAL | Status: DC | PRN
  Filled 2014-01-23: qty 1

## 2014-01-23 MED ORDER — VITAMIN C 500 MG PO TABS
1000.0000 mg | ORAL_TABLET | Freq: Every day | ORAL | Status: DC
  Administered 2014-01-23: 500 mg via ORAL
  Filled 2014-01-23 (×2): qty 2

## 2014-01-23 MED ORDER — IOHEXOL 300 MG/ML  SOLN
INTRAMUSCULAR | Status: DC | PRN
  Administered 2014-01-23: 300 mL

## 2014-01-23 MED ORDER — ONDANSETRON HCL 4 MG/2ML IJ SOLN
INTRAMUSCULAR | Status: DC | PRN
  Administered 2014-01-23: 4 mg via INTRAVENOUS

## 2014-01-23 MED ORDER — ONDANSETRON HCL 4 MG/2ML IJ SOLN
INTRAMUSCULAR | Status: AC
  Filled 2014-01-23: qty 2

## 2014-01-23 MED ORDER — POTASSIUM CHLORIDE IN NACL 20-0.9 MEQ/L-% IV SOLN
INTRAVENOUS | Status: DC
  Administered 2014-01-23: 19:00:00 via INTRAVENOUS
  Filled 2014-01-23 (×3): qty 1000

## 2014-01-23 MED ORDER — METOPROLOL SUCCINATE ER 50 MG PO TB24
50.0000 mg | ORAL_TABLET | Freq: Every day | ORAL | Status: DC
  Administered 2014-01-24: 50 mg via ORAL
  Filled 2014-01-23 (×2): qty 1

## 2014-01-23 MED ORDER — NEOSTIGMINE METHYLSULFATE 10 MG/10ML IV SOLN
INTRAVENOUS | Status: DC | PRN
Start: 2014-01-23 — End: 2014-01-23
  Administered 2014-01-23: 4 mg via INTRAVENOUS

## 2014-01-23 MED ORDER — LIDOCAINE HCL (CARDIAC) 20 MG/ML IV SOLN
INTRAVENOUS | Status: AC
  Filled 2014-01-23: qty 5

## 2014-01-23 MED ORDER — FINASTERIDE 5 MG PO TABS
5.0000 mg | ORAL_TABLET | Freq: Every day | ORAL | Status: DC
  Filled 2014-01-23: qty 1

## 2014-01-23 MED ORDER — PROPOFOL 10 MG/ML IV BOLUS
INTRAVENOUS | Status: AC
  Filled 2014-01-23: qty 20

## 2014-01-23 MED ORDER — ARTIFICIAL TEARS OP OINT
TOPICAL_OINTMENT | OPHTHALMIC | Status: AC
  Filled 2014-01-23: qty 3.5

## 2014-01-23 MED ORDER — ASPIRIN EC 325 MG PO TBEC
325.0000 mg | DELAYED_RELEASE_TABLET | Freq: Every day | ORAL | Status: DC
  Filled 2014-01-23 (×2): qty 1

## 2014-01-23 MED ORDER — HYDROMORPHONE HCL 1 MG/ML IJ SOLN
0.2500 mg | INTRAMUSCULAR | Status: DC | PRN
  Administered 2014-01-23: 0.25 mg via INTRAVENOUS

## 2014-01-23 MED ORDER — CHLORHEXIDINE GLUCONATE 4 % EX LIQD
1.0000 "application " | Freq: Once | CUTANEOUS | Status: DC
  Filled 2014-01-23: qty 15

## 2014-01-23 MED ORDER — OXYCODONE-ACETAMINOPHEN 5-325 MG PO TABS
1.0000 | ORAL_TABLET | ORAL | Status: DC | PRN
  Administered 2014-01-24: 2 via ORAL
  Filled 2014-01-23: qty 2

## 2014-01-23 MED ORDER — ONDANSETRON HCL 4 MG PO TABS: 4.0000 mg | ORAL_TABLET | Freq: Four times a day (QID) | ORAL | Status: DC | PRN

## 2014-01-23 MED ORDER — DEXAMETHASONE SODIUM PHOSPHATE 4 MG/ML IJ SOLN
INTRAMUSCULAR | Status: DC | PRN
  Administered 2014-01-23: 4 mg via INTRAVENOUS

## 2014-01-23 MED ORDER — SODIUM CHLORIDE 0.9 % IR SOLN
Status: DC | PRN
  Administered 2014-01-23 (×4): 1000 mL

## 2014-01-23 MED ORDER — ROCURONIUM BROMIDE 50 MG/5ML IV SOLN
INTRAVENOUS | Status: AC
  Filled 2014-01-23: qty 1

## 2014-01-23 MED ORDER — VITAMIN D3 25 MCG (1000 UNIT) PO TABS
5000.0000 [IU] | ORAL_TABLET | Freq: Every day | ORAL | Status: DC
  Administered 2014-01-23: 5000 [IU] via ORAL
  Filled 2014-01-23 (×2): qty 5

## 2014-01-23 MED ORDER — DEXAMETHASONE SODIUM PHOSPHATE 4 MG/ML IJ SOLN
INTRAMUSCULAR | Status: AC
  Filled 2014-01-23: qty 1

## 2014-01-23 MED ORDER — ENOXAPARIN SODIUM 40 MG/0.4ML ~~LOC~~ SOLN
40.0000 mg | SUBCUTANEOUS | Status: DC
  Filled 2014-01-23: qty 0.4

## 2014-01-23 MED ORDER — LACTATED RINGERS IV SOLN
INTRAVENOUS | Status: DC | PRN
  Administered 2014-01-23: 12:00:00 via INTRAVENOUS

## 2014-01-23 MED ORDER — HYDROMORPHONE HCL 1 MG/ML IJ SOLN: 1.0000 mg | INTRAMUSCULAR | Status: DC | PRN

## 2014-01-23 MED ORDER — ATORVASTATIN CALCIUM 80 MG PO TABS
80.0000 mg | ORAL_TABLET | Freq: Every day | ORAL | Status: DC
  Administered 2014-01-23: 80 mg via ORAL
  Filled 2014-01-23 (×2): qty 1

## 2014-01-23 SURGICAL SUPPLY — 47 items
APPLIER CLIP ROT 10 11.4 M/L (STAPLE) ×2
APR CLP MED LRG 11.4X10 (STAPLE) ×1
BAG SPEC RTRVL LRG 6X4 10 (ENDOMECHANICALS) ×1
CANISTER SUCTION 2500CC (MISCELLANEOUS) ×2 IMPLANT
CHLORAPREP W/TINT 26ML (MISCELLANEOUS) ×2 IMPLANT
CLIP APPLIE ROT 10 11.4 M/L (STAPLE) ×1 IMPLANT
COVER MAYO STAND STRL (DRAPES) ×2 IMPLANT
COVER SURGICAL LIGHT HANDLE (MISCELLANEOUS) ×2 IMPLANT
DRAPE C-ARM 42X72 X-RAY (DRAPES) ×2 IMPLANT
DRAPE LAPAROSCOPIC ABDOMINAL (DRAPES) ×2 IMPLANT
ELECT REM PT RETURN 9FT ADLT (ELECTROSURGICAL) ×2
ELECTRODE REM PT RTRN 9FT ADLT (ELECTROSURGICAL) ×1 IMPLANT
GLOVE BIO SURGEON STRL SZ7 (GLOVE) ×2 IMPLANT
GLOVE BIO SURGEON STRL SZ7.5 (GLOVE) ×2 IMPLANT
GLOVE BIOGEL PI IND STRL 7.0 (GLOVE) ×3 IMPLANT
GLOVE BIOGEL PI IND STRL 7.5 (GLOVE) ×1 IMPLANT
GLOVE BIOGEL PI IND STRL 8 (GLOVE) ×1 IMPLANT
GLOVE BIOGEL PI INDICATOR 7.0 (GLOVE) ×3
GLOVE BIOGEL PI INDICATOR 7.5 (GLOVE) ×1
GLOVE BIOGEL PI INDICATOR 8 (GLOVE) ×1
GLOVE ECLIPSE 8.0 STRL XLNG CF (GLOVE) ×2 IMPLANT
GLOVE EUDERMIC 7 POWDERFREE (GLOVE) ×2 IMPLANT
GLOVE SURG SS PI 7.0 STRL IVOR (GLOVE) ×2 IMPLANT
GLOVE SURG SS PI 7.5 STRL IVOR (GLOVE) ×2 IMPLANT
GOWN STRL REUS W/ TWL LRG LVL3 (GOWN DISPOSABLE) ×6 IMPLANT
GOWN STRL REUS W/ TWL XL LVL3 (GOWN DISPOSABLE) ×1 IMPLANT
GOWN STRL REUS W/TWL LRG LVL3 (GOWN DISPOSABLE) ×12
GOWN STRL REUS W/TWL XL LVL3 (GOWN DISPOSABLE) ×2
KIT BASIN OR (CUSTOM PROCEDURE TRAY) ×2 IMPLANT
KIT ROOM TURNOVER OR (KITS) ×2 IMPLANT
LIQUID BAND (GAUZE/BANDAGES/DRESSINGS) ×2 IMPLANT
NS IRRIG 1000ML POUR BTL (IV SOLUTION) ×2 IMPLANT
PAD ARMBOARD 7.5X6 YLW CONV (MISCELLANEOUS) ×2 IMPLANT
POUCH SPECIMEN RETRIEVAL 10MM (ENDOMECHANICALS) ×2 IMPLANT
SCISSORS LAP 5X35 DISP (ENDOMECHANICALS) ×2 IMPLANT
SET CHOLANGIOGRAPH 5 50 .035 (SET/KITS/TRAYS/PACK) ×2 IMPLANT
SET IRRIG TUBING LAPAROSCOPIC (IRRIGATION / IRRIGATOR) ×2 IMPLANT
SLEEVE ENDOPATH XCEL 5M (ENDOMECHANICALS) ×2 IMPLANT
SPECIMEN JAR SMALL (MISCELLANEOUS) ×2 IMPLANT
SUT MNCRL AB 4-0 PS2 18 (SUTURE) ×2 IMPLANT
TOWEL OR 17X24 6PK STRL BLUE (TOWEL DISPOSABLE) ×2 IMPLANT
TOWEL OR 17X26 10 PK STRL BLUE (TOWEL DISPOSABLE) ×2 IMPLANT
TRAY LAPAROSCOPIC (CUSTOM PROCEDURE TRAY) ×2 IMPLANT
TROCAR XCEL BLUNT TIP 100MML (ENDOMECHANICALS) ×2 IMPLANT
TROCAR XCEL NON-BLD 11X100MML (ENDOMECHANICALS) ×2 IMPLANT
TROCAR XCEL NON-BLD 5MMX100MML (ENDOMECHANICALS) ×2 IMPLANT
TUBING INSUFFLATION (TUBING) ×2 IMPLANT

## 2014-01-23 NOTE — Discharge Instructions (Signed)
-  see above 

## 2014-01-23 NOTE — Interval H&P Note (Signed)
History and Physical Interval Note:  01/23/2014 11:04 AM  [name]  has presented today for surgery, with the diagnosis of gallstones  The various methods of treatment have been discussed with the patient and family. After consideration of risks, benefits and other options for treatment, the patient has consented to  Procedure(s): LAPAROSCOPIC CHOLECYSTECTOMY WITH INTRAOPERATIVE CHOLANGIOGRAM POSSIBLE OPEN (N/A) as a surgical intervention .  The patient's history has been reviewed, patient examined, no change in status, stable for surgery.  I have reviewed the patient's chart and labs.  Questions were answered to the patient's satisfaction.     Adin Hector

## 2014-01-23 NOTE — Transfer of Care (Signed)
Immediate Anesthesia Transfer of Care Note  Patient: [name]  Procedure(s) Performed: Procedure(s): LAPAROSCOPIC CHOLECYSTECTOMY WITH INTRAOPERATIVE CHOLANGIOGRAM  (N/A)  Patient Location: PACU  Anesthesia Type:General  Level of Consciousness: awake and patient cooperative  Airway & Oxygen Therapy: Patient Spontanous Breathing and Patient connected to face mask oxygen  Post-op Assessment: Report given to PACU RN, Post -op Vital signs reviewed and stable and Patient moving all extremities  Post vital signs: Reviewed and stable  Complications: No apparent anesthesia complications

## 2014-01-23 NOTE — Op Note (Signed)
Patient Name:           [name]   Date of Surgery:        01/23/2014  Pre op Diagnosis:      Chronic cholecystitis with cholelithiasis  Post op Diagnosis:    Same  Procedure:                 Laparoscopic cholecystectomy with cholangiogram  Surgeon:                     Edsel Petrin. Dalbert Batman, M.D., FACS  Assistant:                      Jackolyn Confer, M.D.  Operative Indications:  . This gentleman returns to see me to discuss cholecystectomy. This is a 69 year old man who I last saw on June 28, 2013. He's been having attacks of centralized abdominal pain, typically in the evening which last about an hour. Mild nausea but no vomiting. He also has some atypical right lower anterior chest pain which Is almost daily. He's had recent thorough cardiac evaluation by Dr. Peter [name]. His Myoview looks good with chronic lateral wall infarct but no ischemia. Dr. [name] has cleared him for surgery. He states that his recent upper endoscopy was negative. He has known Barrett's esophagus. He's followed by Dr. Irine Seal for hematuria. He is ready to have his gallbladder out. Recall that his gallbladder ultrasound shows multiple gallstones, 3 mm wall thickness, CBD 3.1 mm. It is notable that he can work hard all day in the yard and gives no pain. He has a house at [employer]. comorbidities include coronary artery disease, history of cardiac surgery 1996, Barrett's esophagus without dysplasia, GERD, history kidney stones, history: Polyps.  He is on aspirin, beta blockers, antihypertensive, isosorbide, statin drugs.   Operative Findings:       The gallbladder was chronically inflamed and contained numerous black spherical stones. The wall of the gallbladder was chronically thickened. There were some adhesions to the lower body and infundibulum of the gallbladder. The liver appeared healthy. The cholangiogram was normal, showing normal intrahepatic and extrahepatic biliary anatomy, no dilatation, no  filling defect, and no obstruction with good flow of contrast into the duodenum. The gallbladder wall was fragile and tore and we  spilled a few stones but these were all  retrieved and we do not feel that we left any stones behind. No other abnormalities were noted on general survey.  Procedure in Detail:          Following the induction of general endotracheal anesthesia, the patient's abdomen was prepped and draped in a sterile fashion. Surgical timeout was performed. Intravenous antibiotics were given. 0.5% Marcaine with epinephrine was used as a local infiltration anesthetic.     A vertical incision was made in the lower rim of the umbilicus. The fascia was incised in the midline and the abdominal cavity entered under direct vision. An 11 mm Hassan trocar was inserted and secured with the Purstring suture of 0 Vicryl. Pneumoperitoneum was created and video camera was inserted. An 11 mm trochar was  placed in subxiphoid region and two 5 mm trochars placed in the right upper quadrant. Gallbladder fundus was identified and elevated. Adhesions were taking down, allowing [place] to retract the infundibulum laterally. I dissected out the cystic duct and the cystic artery. I created a nice window behind these 2 structures. The cholangiogram catheter was inserted into the cystic duct  and a cholangiogram was obtained using the C-arm. The cholangiogram was normal as described above. The cholangiogram catheter was removed. The cystic duct and the cystic artery were then secured with multiple medical clips and divided. We slowly dissected the gallbladder from its bed. The back wall was rather thickened and chronically scarred. One small hole in the gallbladder resulted in the loss of a few stones which were then removed. We completed the dissection of the gallbladder placed in a specimen bag and removed.    I then irrigated the subhepatic and subphrenic space with about 3 L of saline. I did this so as to remove all stones.  The last 3 irrigations were completely clear. There were no stones. There was no bile leak. There was no bleeding. The trochars were removed. The pneumoperitoneum was released. There was no bleeding. The fascia at the umbilicus was closed with 0 Vicryl sutures. Skin incisions were closed with subcuticular sutures of 4-0 Monocryl and Dermabond. The patient tolerated the procedure well was taken to PACU in stable condition. EBL 15 mL. Counts correct. Complications none.     Edsel Petrin. Dalbert Batman, M.D., FACS General and Minimally Invasive Surgery Breast and Colorectal Surgery  01/23/2014 1:05 PM

## 2014-01-23 NOTE — Progress Notes (Signed)
Report given to celine harris rn as caregiver

## 2014-01-23 NOTE — Anesthesia Preprocedure Evaluation (Addendum)
Anesthesia Evaluation  Patient identified by MRN, date of birth, ID band Patient awake    Reviewed: Allergy & Precautions, H&P , NPO status , Patient's Chart, lab work & pertinent test results, reviewed documented beta blocker date and time   Airway Mallampati: III  TM Distance: >3 FB Neck ROM: Full    Dental no notable dental hx. (+) Teeth Intact, Dental Advisory Given   Pulmonary sleep apnea , former smoker,  breath sounds clear to auscultation  Pulmonary exam normal       Cardiovascular hypertension, On Medications and On Home Beta Blockers + CAD and + CABG negative cardio ROS  + dysrhythmias Rhythm:Regular Rate:Normal     Neuro/Psych negative neurological ROS  negative psych ROS   GI/Hepatic Neg liver ROS, PUD, GERD-  Medicated and Controlled,  Endo/Other  negative endocrine ROS  Renal/GU negative Renal ROS  negative genitourinary   Musculoskeletal   Abdominal   Peds  Hematology negative hematology ROS (+)   Anesthesia Other Findings   Reproductive/Obstetrics negative OB ROS                            Anesthesia Physical Anesthesia Plan  ASA: III  Anesthesia Plan: General   Post-op Pain Management:    Induction: Intravenous  Airway Management Planned: Oral ETT  Additional Equipment:   Intra-op Plan:   Post-operative Plan: Extubation in OR  Informed Consent: I have reviewed the patients History and Physical, chart, labs and discussed the procedure including the risks, benefits and alternatives for the proposed anesthesia with the patient or authorized representative who has indicated his/her understanding and acceptance.   Dental advisory given  Plan Discussed with: CRNA  Anesthesia Plan Comments:         Anesthesia Quick Evaluation

## 2014-01-24 ENCOUNTER — Encounter (HOSPITAL_COMMUNITY): Payer: Self-pay | Admitting: General Surgery

## 2014-01-24 DIAGNOSIS — K8010 Calculus of gallbladder with chronic cholecystitis without obstruction: Secondary | ICD-10-CM | POA: Diagnosis not present

## 2014-01-24 LAB — CBC
HEMATOCRIT: 42.3 % (ref 39.0–52.0)
HEMOGLOBIN: 14.3 g/dL (ref 13.0–17.0)
MCH: 29.6 pg (ref 26.0–34.0)
MCHC: 33.8 g/dL (ref 30.0–36.0)
MCV: 87.6 fL (ref 78.0–100.0)
Platelets: 217 10*3/uL (ref 150–400)
RBC: 4.83 MIL/uL (ref 4.22–5.81)
RDW: 12.7 % (ref 11.5–15.5)
WBC: 11 10*3/uL — ABNORMAL HIGH (ref 4.0–10.5)

## 2014-01-24 LAB — BASIC METABOLIC PANEL
Anion gap: 7 (ref 5–15)
BUN: 17 mg/dL (ref 6–23)
CALCIUM: 9.2 mg/dL (ref 8.4–10.5)
CO2: 26 mmol/L (ref 19–32)
Chloride: 102 mEq/L (ref 96–112)
Creatinine, Ser: 0.92 mg/dL (ref 0.50–1.35)
GFR calc non Af Amer: 83 mL/min — ABNORMAL LOW (ref >90)
Glucose, Bld: 130 mg/dL — ABNORMAL HIGH (ref 70–99)
POTASSIUM: 3.9 mmol/L (ref 3.5–5.1)
Sodium: 135 mmol/L (ref 135–145)

## 2014-01-24 NOTE — Discharge Summary (Addendum)
Patient ID: [name] [id number] 69 y.o. [birth year]  Admit date: 01/23/2014  Discharge date and time: 01/24/2013  Admitting Physician: Adin Hector  Discharge Physician: Adin Hector  Admission Diagnoses: gallstones  Discharge Diagnoses: Chronic cholecystitis with cholelithiasis Coronary artery disease, followed by Peter [name] next line history of four-vessel coronary artery bypass graft Barrett's esophagus Hyperlipidemia Chronic hematuria, followed by Dr. Irine Seal Chronic, intermittent right chest pain of uncertain etiology  Operations: Procedure(s): LAPAROSCOPIC CHOLECYSTECTOMY WITH INTRAOPERATIVE CHOLANGIOGRAM   Admission Condition: good  Discharged Condition: good  Indication for Admission: This gentleman came  to see me to discuss cholecystectomy. This is a 69 year old man who I last saw on June 28, 2013. He's been having attacks of centralized abdominal pain, typically in the evening which last about an hour. Mild nausea but no vomiting. He also has some atypical right lower anterior chest pain which Is almost daily. He's had a GI evaluation by Dr. Laurence Spates. He's had recent thorough cardiac evaluation by Dr. Peter [name]. His Myoview looks good with chronic lateral wall infarct but no ischemia. Dr. [name] has cleared him for surgery. He states that his recent upper endoscopy was negative. He has known Barrett's esophagus. He's followed by Dr. Irine Seal for hematuria. He is ready to have his gallbladder out. Recall that his gallbladder ultrasound shows multiple gallstones, 3 mm wall thickness, CBD 3.1 mm. It is notable that he can work hard all day in the yard and gives no pain. He has a house at [employer]. comorbidities include coronary artery disease, history of cardiac surgery 1996, Barrett's esophagus without dysplasia, GERD, history kidney stones, history: Polyps.  He is on aspirin, beta blockers, antihypertensive, isosorbide, statin drugs.    Hospital Course: On the day of admission the patient was taken to the operating room and underwent a laparoscopic cholecystectomy with cholangiogram. The gallbladder was chronically inflamed, slightly thick wall and scarred in. Contain numerous black stones. The cholangiogram was normal. There were no other pathologic findings on general survey of the abdomen. Because of his cardiac history we chose to observe him in the hospital overnight. He had no problems. Vital signs remained stable. He tolerated diet well without nausea. He was voiding without difficulty. Pain was well controlled. He was able to ambulate in the hall and was ready to go home.     His only concern was that he still has some right mid chest pains, similar to what he has had in the past. Nonpleuritic. Nontender. Although he is perfectly well and happy and in no distress, he is somewhat frustrated by this being present. I told him I was unsure of the cause of this and suggested a pulmonary medicine consult and gave him some names. I told him that he had a clearly diseased gallbladder.    He was discharged home on postoperative day 1. He was given a prescription for hydrocodone for pain. Diet and activities were discussed. He was asked to return to see me in the office in about 3 weeks.  Consults: None  Significant Diagnostic Studies: Surgical pathology, pending  Treatments: surgery: Laparoscopic cholecystectomy with cholangiogram  Disposition: Home  Patient Instructions:    Medication List    TAKE these medications        aspirin EC 325 MG tablet  Take 325 mg by mouth daily.     atorvastatin 80 MG tablet  Commonly known as:  LIPITOR  Take 1 tablet (80 mg total) by mouth daily.     finasteride  5 MG tablet  Commonly known as:  PROSCAR  Take 1 tablet (5 mg total) by mouth daily.     HYDROcodone-acetaminophen 5-325 MG per tablet  Commonly known as:  NORCO  Take 1-2 tablets by mouth every 6 (six) hours as needed for  moderate pain or severe pain.     isosorbide mononitrate 60 MG 24 hr tablet  Commonly known as:  IMDUR  Take 1 tablet (60 mg total) by mouth daily.     metoprolol succinate 50 MG 24 hr tablet  Commonly known as:  TOPROL-XL  Take 1 tablet (50 mg total) by mouth daily. Take with or immediately following a meal.     nitroGLYCERIN 0.4 MG SL tablet  Commonly known as:  NITROSTAT  Place 1 tablet (0.4 mg total) under the tongue every 5 (five) minutes as needed for chest pain (up to 3 doses. if pain persist call 911).     oxybutynin 5 MG tablet  Commonly known as:  DITROPAN  Take 5 mg by mouth daily.     pantoprazole 40 MG tablet  Commonly known as:  PROTONIX  Take 1 tablet (40 mg total) by mouth daily.     ramipril 5 MG capsule  Commonly known as:  ALTACE  Take 1 capsule (5 mg total) by mouth daily.     valACYclovir 500 MG tablet  Commonly known as:  VALTREX  Take 500 mg by mouth daily as needed (outbreak).     VITAMIN C PO  Take 1,000 mg by mouth daily.     VITAMIN D PO  Take 5,000 Units by mouth daily.        Activity: No sports or heavy lifting for 3 weeks. No driving for 2-4 days. Daily ambulation encouraged. Diet: low fat, low cholesterol diet Wound Care: none needed  Follow-up:  With Dr. Dalbert Batman in 3 weeks.  Signed: Edsel Petrin. Dalbert Batman, M.D., FACS General and minimally invasive surgery Breast and Colorectal Surgery  01/24/2014, 6:03 AM

## 2014-01-25 NOTE — Anesthesia Postprocedure Evaluation (Signed)
  Anesthesia Post-op Note  Patient: [name]  Procedure(s) Performed: Procedure(s): LAPAROSCOPIC CHOLECYSTECTOMY WITH INTRAOPERATIVE CHOLANGIOGRAM  (N/A)  Patient Location: PACU  Anesthesia Type:General  Level of Consciousness: awake and alert   Airway and Oxygen Therapy: Patient Spontanous Breathing  Post-op Pain: none  Post-op Assessment: Post-op Vital signs reviewed, Patient's Cardiovascular Status Stable and Respiratory Function Stable  Post-op Vital Signs: Reviewed  Filed Vitals:   01/24/14 0444  BP: 132/58  Pulse: 65  Temp: 36.6 C  Resp: 18    Complications: No apparent anesthesia complications

## 2014-02-21 ENCOUNTER — Other Ambulatory Visit: Payer: Medicare Other

## 2014-02-21 ENCOUNTER — Encounter: Payer: Self-pay | Admitting: Pulmonary Disease

## 2014-02-21 ENCOUNTER — Encounter (INDEPENDENT_AMBULATORY_CARE_PROVIDER_SITE_OTHER): Payer: Self-pay

## 2014-02-21 ENCOUNTER — Ambulatory Visit (INDEPENDENT_AMBULATORY_CARE_PROVIDER_SITE_OTHER)
Admission: RE | Admit: 2014-02-21 | Discharge: 2014-02-21 | Disposition: A | Discharge status: Home / Self Care | Payer: Medicare Other | Source: Ambulatory Visit | Attending: Pulmonary Disease | Admitting: Pulmonary Disease

## 2014-02-21 ENCOUNTER — Ambulatory Visit (INDEPENDENT_AMBULATORY_CARE_PROVIDER_SITE_OTHER): Payer: Medicare Other | Admitting: Pulmonary Disease

## 2014-02-21 VITALS — BP 134/72 | HR 65 | Temp 97.0°F | Ht 70.0 in | Wt 216.0 lb

## 2014-02-21 DIAGNOSIS — R079 Chest pain, unspecified: Secondary | ICD-10-CM

## 2014-02-21 DIAGNOSIS — R0789 Other chest pain: Secondary | ICD-10-CM

## 2014-02-21 NOTE — Progress Notes (Signed)
   Subjective:    Patient ID: [name], male    DOB: [birth year], 69 y.o.   MRN: [id number]  HPI The patient is a 69 year old male who I've been asked to see for atypical chest discomfort. This started approximately 2 years ago, and gradually worsened over time. It is intermittent in nature, but becomes very severe at night upon lying down. It happens almost every night. The patient states that he will take sublingual nitroglycerin which significantly relieves his pain, but may take up to 6 or 7 tablets to do that. The discomfort does not occur with activity except on rare occasions. It is localized to an area just above the right nipple, and can sometimes be worsened by bending over. It is not worsened with palpation, and it is clearly sharp in nature but not pleuritic. The patient denies any significant reflux symptoms, but does have a history of a hiatal hernia and Barrett's esophagus. He has had no cough or chest congestion, and has seen no change in his exertional tolerance. He had a nuclear stress test recently that was unremarkable by his history. He recently had his gallbladder removed, with no change in his chest symptoms. She has no history to suggest thromboembolic disease, and has not had a recent chest x-ray.   Review of Systems  Constitutional: Negative for fever and unexpected weight change.  HENT: Negative for congestion, dental problem, ear pain, nosebleeds, postnasal drip, rhinorrhea, sinus pressure, sneezing, sore throat and trouble swallowing.   Eyes: Negative for redness and itching.  Respiratory: Negative for cough, chest tightness, shortness of breath and wheezing.   Cardiovascular: Positive for chest pain. Negative for palpitations and leg swelling.  Gastrointestinal: Negative for nausea and vomiting.  Genitourinary: Negative for dysuria.  Musculoskeletal: Negative for joint swelling.  Skin: Negative for rash.  Neurological: Negative for headaches.  Hematological: Does  not bruise/bleed easily.  Psychiatric/Behavioral: Negative for dysphoric mood. The patient is not nervous/anxious.        Objective:   Physical Exam Constitutional:  Well developed, no acute distress  HENT:  Nares patent without discharge  Oropharynx without exudate, palate and uvula are normal  Eyes:  Perrla, eomi, no scleral icterus  Neck:  No JVD, no TMG  Cardiovascular:  Normal rate, regular rhythm, no rubs or gallops.  3/6 blowing murmur        Intact distal pulses  Pulmonary :  Normal breath sounds, no stridor or respiratory distress   No rales, rhonchi, or wheezing  Abdominal:  Soft, nondistended, bowel sounds present.  No tenderness noted.   Musculoskeletal:  No lower extremity edema noted.  Lymph Nodes:  No cervical lymphadenopathy noted  Skin:  No cyanosis noted  Neurologic:  Alert, appropriate, moves all 4 extremities without obvious deficit.         Assessment & Plan:

## 2014-02-21 NOTE — Patient Instructions (Signed)
Will check d dimer blood test to see if you may have an atypical presentation of a blood clot Will check chest xray today and call you with results. If all of the above are unremarkable, will have to decide about a scan of your chest to put the possibility of a pulmonary cause for your chest discomfort to rest.

## 2014-02-21 NOTE — Assessment & Plan Note (Signed)
The patient has atypical chest discomfort for the last 2 years that is clearly worse on lying down during the night. From his description, I'm suspicious of musculoskeletal chest discomfort, although the association with lying down at night and responding to nitroglycerin sublingual is more suspicious for something such as esophageal spasm. I do not see anything from his history or exam to suggest a pulmonary etiology, however I explained to him there are often atypical presentations of typical problems. I would like to do a d-dimer today for completeness, although there is nothing by history to suggest thromboembolic disease. Will also do a plain chest x-ray. If all of this is unremarkable, he and I will decide whether it is worth doing a CT chest to finish the pulmonary evaluation.

## 2014-02-22 LAB — D-DIMER, QUANTITATIVE: D-Dimer, Quant: 0.86 ug/mL-FEU — ABNORMAL HIGH (ref 0.00–0.48)

## 2014-02-23 ENCOUNTER — Other Ambulatory Visit: Payer: Self-pay | Admitting: Pulmonary Disease

## 2014-02-23 DIAGNOSIS — R0789 Other chest pain: Secondary | ICD-10-CM

## 2014-02-26 ENCOUNTER — Other Ambulatory Visit: Payer: Medicare Other

## 2014-02-27 ENCOUNTER — Ambulatory Visit (INDEPENDENT_AMBULATORY_CARE_PROVIDER_SITE_OTHER)
Admission: RE | Admit: 2014-02-27 | Discharge: 2014-02-27 | Disposition: A | Discharge status: Home / Self Care | Payer: Medicare Other | Source: Ambulatory Visit | Attending: Pulmonary Disease | Admitting: Pulmonary Disease

## 2014-02-27 DIAGNOSIS — R0789 Other chest pain: Secondary | ICD-10-CM

## 2014-02-27 MED ORDER — IOHEXOL 350 MG/ML SOLN
80.0000 mL | Freq: Once | INTRAVENOUS | Status: AC | PRN
  Administered 2014-02-27: 80 mL via INTRAVENOUS

## 2014-03-05 ENCOUNTER — Other Ambulatory Visit: Payer: Self-pay | Admitting: Dermatology

## 2014-03-06 ENCOUNTER — Ambulatory Visit (INDEPENDENT_AMBULATORY_CARE_PROVIDER_SITE_OTHER): Payer: Medicare Other | Admitting: Cardiology

## 2014-03-06 ENCOUNTER — Encounter: Payer: Self-pay | Admitting: Cardiology

## 2014-03-06 ENCOUNTER — Other Ambulatory Visit: Payer: Self-pay | Admitting: Cardiology

## 2014-03-06 ENCOUNTER — Telehealth: Payer: Self-pay

## 2014-03-06 VITALS — BP 124/78 | HR 65 | Ht 70.0 in | Wt 212.5 lb

## 2014-03-06 DIAGNOSIS — K2270 Barrett's esophagus without dysplasia: Secondary | ICD-10-CM

## 2014-03-06 DIAGNOSIS — R0789 Other chest pain: Secondary | ICD-10-CM

## 2014-03-06 DIAGNOSIS — R072 Precordial pain: Secondary | ICD-10-CM

## 2014-03-06 DIAGNOSIS — Z8679 Personal history of other diseases of the circulatory system: Secondary | ICD-10-CM

## 2014-03-06 DIAGNOSIS — Z8249 Family history of ischemic heart disease and other diseases of the circulatory system: Secondary | ICD-10-CM

## 2014-03-06 DIAGNOSIS — I25708 Atherosclerosis of coronary artery bypass graft(s), unspecified, with other forms of angina pectoris: Secondary | ICD-10-CM

## 2014-03-06 DIAGNOSIS — I447 Left bundle-branch block, unspecified: Secondary | ICD-10-CM

## 2014-03-06 DIAGNOSIS — I200 Unstable angina: Secondary | ICD-10-CM

## 2014-03-06 NOTE — Progress Notes (Signed)
[name] Date of Birth: 1945-11-12 Medical Record #[id number]  History of Present Illness: [name] is seen for evaluation of chest pain. He has a known history of coronary disease and is status post CABG in 1996. This included an LIMA graft to the LAD and diagonal, sequential vein graft to the acute marginal, PDA, and posterior lateral branches of the right coronary, sequential saphenous vein graft to the obtuse marginal vessel and distal circumflex. He had a cardiac catheterization in 2007 which showed that his grafts were all patent. He has chronic chest pain. This does respond to nitrate therapy. His pain is described as a right precordial pain. It mostly occurs when he is lying down. He reports that this pain is getting worse. He may take up to 10 sl Ntg a night. No clear triggers and nothing else seems to give him relief. His last Myoview study in January showed a lateral wall scar with no ischemia. He had cholecystectomy in January without complications and without relief of his pain. CXR was unremarkable. CT of the chest was negative for PE or aortic pathology. He had EGD by Dr. Oletta Lamas that showed Barrett's esophagus but no evidence of esophagitis. He is on Protonix. If is very frustrated with his continued chest pain.  Current Outpatient Prescriptions on File Prior to Visit  Medication Sig Dispense Refill  . Ascorbic Acid (VITAMIN C PO) Take 1,000 mg by mouth daily.     Marland Kitchen aspirin EC 325 MG tablet Take 325 mg by mouth daily.    Marland Kitchen atorvastatin (LIPITOR) 80 MG tablet Take 1 tablet (80 mg total) by mouth daily. 90 tablet 3  . Cholecalciferol (VITAMIN D PO) Take 5,000 Units by mouth daily.     . finasteride (PROSCAR) 5 MG tablet Take 1 tablet (5 mg total) by mouth daily. 90 tablet 3  . isosorbide mononitrate (IMDUR) 60 MG 24 hr tablet Take 1 tablet (60 mg total) by mouth daily. 90 tablet 3  . metoprolol succinate (TOPROL-XL) 50 MG 24 hr tablet Take 1 tablet (50 mg total) by mouth daily.  Take with or immediately following a meal. 90 tablet 3  . nitroGLYCERIN (NITROSTAT) 0.4 MG SL tablet Place 1 tablet (0.4 mg total) under the tongue every 5 (five) minutes as needed for chest pain (up to 3 doses. if pain persist call 911). 100 tablet 6  . oxybutynin (DITROPAN) 5 MG tablet Take 5 mg by mouth daily.     . pantoprazole (PROTONIX) 40 MG tablet Take 1 tablet (40 mg total) by mouth daily. 90 tablet 3  . ramipril (ALTACE) 5 MG capsule Take 1 capsule (5 mg total) by mouth daily. 90 capsule 3  . valACYclovir (VALTREX) 500 MG tablet Take 500 mg by mouth daily as needed (outbreak).      No current facility-administered medications on file prior to visit.    No Known Allergies  Past Medical History  Diagnosis Date  . Coronary artery disease   . FH: CABG (coronary artery bypass surgery) 1996  . History of obesity   . Hypercholesterolemia   . History of hypertension   . History of hiatal hernia   . History of kidney stones   . History of erectile dysfunction   . Gastroesophageal reflux disease   . Sleep apnea   . History of prostatitis   . Hypertension     Past Surgical History  Procedure Laterality Date  . Coronary artery bypass graft  1996  . Cardiac catheterization  Ejection fraction is estimated at 45%  . Colon surgery    . Cholecystectomy N/A 01/23/2014    Procedure: LAPAROSCOPIC CHOLECYSTECTOMY WITH INTRAOPERATIVE CHOLANGIOGRAM ;  Surgeon: Fanny Skates, MD;  Location: [facility];  Service: General;  Laterality: N/A;    History  Smoking status  . Former Smoker -- 15 years  . Quit date: 01/12/1997  Smokeless tobacco  . Not on file    Comment: 3/4 pack per day. smoked 15 years off and on    History  Alcohol Use  . 0.0 oz/week  . 0 Standard drinks or equivalent per week    Comment: 1 per week    Family History  Problem Relation Age of Onset  . Heart attack Father   . Cancer Sister     Uterine Cancer    Review of Systems: As noted in HPI.  All other  systems were reviewed and are negative.  Physical Exam: BP 124/78 mmHg  Pulse 65  Ht 5\' 10"  (1.778 m)  Wt 212 lb 8 oz (96.389 kg)  BMI 30.49 kg/m2 He is a pleasant white male in no acute distress.The patient is alert and oriented x 3.    The skin is warm and dry.   The HEENT exam is normal.  The carotids are 2+ without bruits.  There is no thyromegaly.  There is no JVD.  The lungs are clear.  The chest wall is non tender.  The heart exam reveals a regular rate with a normal S1 and S2.  There are no murmurs, gallops, or rubs.  The PMI is not displaced.   Abdominal exam reveals good bowel sounds.  There is no guarding or rebound.  There is no hepatosplenomegaly or tenderness.  There are no masses.  Exam of the legs reveal no clubbing, cyanosis, or edema.  The legs are without rashes.  The distal pulses are intact.  Cranial nerves II - XII are intact.  Motor and sensory functions are intact.  The gait is normal.  LABORATORY DATA: ECG today demonstrates normal sinus rhythm with occasional PVCs. He has a chronic left bundle branch block.  Assessment / Plan: 1. Coronary disease with remote coronary bypass surgery in 1996. Cardiac catheterization 2007 showed patent grafts. Myoview study in January 2015 showed no ischemia with lateral infarct. Continues to have significant chest pain requiring high use of nitrates. He is on optimal medical therapy. He has had extensive evaluation for other causes of chest pain with no clear etiology. I have recommended a cardiac cath. He is 20 years post CABG. If grafts are patent then we will need to treat him for chronic chest pain. Would consider Neurontin or muscle relaxant.   2. Chronic chest pain. Symptoms are consistent with a GI source. Prior EGD showed Barrett's esophagus but otherwise no acute findings. This may all be related to his gallstones.  He does get relief with sublingual nitroglycerin. Continue Protonix. Continue to avoid foods that exacerbate his  symptoms.  3. Left bundle branch block  4. Hyperlipidemia.  5. Obesity.   6. Barrett'e esophagus  7. S/p cholecystectomy.

## 2014-03-06 NOTE — Telephone Encounter (Signed)
Received a call from patient 03/05/14 requesting a appointment.Stated he continues to have chest pain.Stated he wanted to see Dr.Jordan only.Appointment scheduled with Dr.Jordan 03/06/14 at 2:45 pm.

## 2014-03-07 LAB — CBC WITH DIFFERENTIAL/PLATELET
Basophils Absolute: 0 10*3/uL (ref 0.0–0.1)
Basophils Relative: 0 % (ref 0–1)
EOS ABS: 0.2 10*3/uL (ref 0.0–0.7)
EOS PCT: 3 % (ref 0–5)
HEMATOCRIT: 41.2 % (ref 39.0–52.0)
Hemoglobin: 13.9 g/dL (ref 13.0–17.0)
LYMPHS ABS: 1 10*3/uL (ref 0.7–4.0)
LYMPHS PCT: 14 % (ref 12–46)
MCH: 29.6 pg (ref 26.0–34.0)
MCHC: 33.7 g/dL (ref 30.0–36.0)
MCV: 87.7 fL (ref 78.0–100.0)
MONO ABS: 0.7 10*3/uL (ref 0.1–1.0)
MONOS PCT: 10 % (ref 3–12)
MPV: 9.9 fL (ref 8.6–12.4)
Neutro Abs: 5.3 10*3/uL (ref 1.7–7.7)
Neutrophils Relative %: 73 % (ref 43–77)
Platelets: 193 10*3/uL (ref 150–400)
RBC: 4.7 MIL/uL (ref 4.22–5.81)
RDW: 13.8 % (ref 11.5–15.5)
WBC: 7.2 10*3/uL (ref 4.0–10.5)

## 2014-03-07 LAB — BASIC METABOLIC PANEL
BUN: 19 mg/dL (ref 6–23)
CHLORIDE: 104 meq/L (ref 96–112)
CO2: 29 meq/L (ref 19–32)
CREATININE: 0.83 mg/dL (ref 0.50–1.35)
Calcium: 9.2 mg/dL (ref 8.4–10.5)
GLUCOSE: 104 mg/dL — AB (ref 70–99)
Potassium: 3.8 mEq/L (ref 3.5–5.3)
Sodium: 142 mEq/L (ref 135–145)

## 2014-03-07 LAB — PROTIME-INR
INR: 1.07 (ref <1.50)
PROTHROMBIN TIME: 13.9 s (ref 11.6–15.2)

## 2014-03-13 ENCOUNTER — Encounter (HOSPITAL_COMMUNITY)
Admission: RE | Disposition: A | Discharge status: Home / Self Care | Payer: Medicare Other | Source: Ambulatory Visit | Attending: Cardiology

## 2014-03-13 ENCOUNTER — Ambulatory Visit (HOSPITAL_COMMUNITY)
Admission: RE | Admit: 2014-03-13 | Discharge: 2014-03-14 | Disposition: A | Discharge status: Home / Self Care | Payer: Medicare Other | Source: Ambulatory Visit | Attending: Cardiology | Admitting: Cardiology

## 2014-03-13 ENCOUNTER — Encounter (HOSPITAL_COMMUNITY): Payer: Self-pay | Admitting: General Practice

## 2014-03-13 DIAGNOSIS — E669 Obesity, unspecified: Secondary | ICD-10-CM | POA: Diagnosis not present

## 2014-03-13 DIAGNOSIS — E785 Hyperlipidemia, unspecified: Secondary | ICD-10-CM | POA: Insufficient documentation

## 2014-03-13 DIAGNOSIS — I25719 Atherosclerosis of autologous vein coronary artery bypass graft(s) with unspecified angina pectoris: Secondary | ICD-10-CM

## 2014-03-13 DIAGNOSIS — Z6829 Body mass index (BMI) 29.0-29.9, adult: Secondary | ICD-10-CM | POA: Insufficient documentation

## 2014-03-13 DIAGNOSIS — Z87891 Personal history of nicotine dependence: Secondary | ICD-10-CM | POA: Insufficient documentation

## 2014-03-13 DIAGNOSIS — I447 Left bundle-branch block, unspecified: Secondary | ICD-10-CM | POA: Diagnosis present

## 2014-03-13 DIAGNOSIS — Z7982 Long term (current) use of aspirin: Secondary | ICD-10-CM | POA: Diagnosis not present

## 2014-03-13 DIAGNOSIS — K2270 Barrett's esophagus without dysplasia: Secondary | ICD-10-CM | POA: Insufficient documentation

## 2014-03-13 DIAGNOSIS — I200 Unstable angina: Secondary | ICD-10-CM | POA: Diagnosis present

## 2014-03-13 DIAGNOSIS — I2510 Atherosclerotic heart disease of native coronary artery without angina pectoris: Secondary | ICD-10-CM | POA: Diagnosis present

## 2014-03-13 DIAGNOSIS — E7800 Pure hypercholesterolemia, unspecified: Secondary | ICD-10-CM | POA: Diagnosis present

## 2014-03-13 DIAGNOSIS — I25810 Atherosclerosis of coronary artery bypass graft(s) without angina pectoris: Secondary | ICD-10-CM | POA: Insufficient documentation

## 2014-03-13 DIAGNOSIS — Z8679 Personal history of other diseases of the circulatory system: Secondary | ICD-10-CM

## 2014-03-13 DIAGNOSIS — Z8249 Family history of ischemic heart disease and other diseases of the circulatory system: Secondary | ICD-10-CM

## 2014-03-13 DIAGNOSIS — I25119 Atherosclerotic heart disease of native coronary artery with unspecified angina pectoris: Secondary | ICD-10-CM | POA: Diagnosis present

## 2014-03-13 DIAGNOSIS — I209 Angina pectoris, unspecified: Secondary | ICD-10-CM | POA: Diagnosis present

## 2014-03-13 DIAGNOSIS — I2582 Chronic total occlusion of coronary artery: Secondary | ICD-10-CM | POA: Insufficient documentation

## 2014-03-13 DIAGNOSIS — I25709 Atherosclerosis of coronary artery bypass graft(s), unspecified, with unspecified angina pectoris: Secondary | ICD-10-CM | POA: Diagnosis present

## 2014-03-13 HISTORY — DX: Atherosclerosis of autologous vein coronary artery bypass graft(s) with unspecified angina pectoris: I25.719

## 2014-03-13 HISTORY — DX: Calculus of kidney: N20.0

## 2014-03-13 HISTORY — PX: CORONARY ANGIOPLASTY WITH STENT PLACEMENT: SHX49

## 2014-03-13 HISTORY — DX: Obstructive sleep apnea (adult) (pediatric): G47.33

## 2014-03-13 HISTORY — PX: LEFT HEART CATHETERIZATION WITH CORONARY/GRAFT ANGIOGRAM: SHX5450

## 2014-03-13 LAB — POCT ACTIVATED CLOTTING TIME: ACTIVATED CLOTTING TIME: 491 s

## 2014-03-13 SURGERY — LEFT HEART CATHETERIZATION WITH CORONARY/GRAFT ANGIOGRAM

## 2014-03-13 MED ORDER — FENTANYL CITRATE 0.05 MG/ML IJ SOLN
INTRAMUSCULAR | Status: AC
  Filled 2014-03-13: qty 2

## 2014-03-13 MED ORDER — ATORVASTATIN CALCIUM 80 MG PO TABS
80.0000 mg | ORAL_TABLET | Freq: Every day | ORAL | Status: DC
  Administered 2014-03-14: 80 mg via ORAL
  Filled 2014-03-13: qty 1

## 2014-03-13 MED ORDER — SODIUM CHLORIDE 0.9 % IJ SOLN: 3.0000 mL | Freq: Two times a day (BID) | INTRAMUSCULAR | Status: DC

## 2014-03-13 MED ORDER — MIDAZOLAM HCL 2 MG/2ML IJ SOLN
INTRAMUSCULAR | Status: AC
  Filled 2014-03-13: qty 2

## 2014-03-13 MED ORDER — VERAPAMIL HCL 2.5 MG/ML IV SOLN
INTRAVENOUS | Status: AC
  Filled 2014-03-13: qty 2

## 2014-03-13 MED ORDER — SODIUM CHLORIDE 0.9 % IV SOLN: 250.0000 mL | INTRAVENOUS | Status: DC | PRN

## 2014-03-13 MED ORDER — NITROGLYCERIN 0.4 MG SL SUBL: 0.4000 mg | SUBLINGUAL_TABLET | SUBLINGUAL | Status: DC | PRN

## 2014-03-13 MED ORDER — ASPIRIN 81 MG PO CHEW: 81.0000 mg | CHEWABLE_TABLET | ORAL | Status: DC

## 2014-03-13 MED ORDER — VITAMIN D3 25 MCG (1000 UNIT) PO TABS
5000.0000 [IU] | ORAL_TABLET | Freq: Every day | ORAL | Status: DC
  Administered 2014-03-14: 11:00:00 5000 [IU] via ORAL
  Filled 2014-03-13: qty 5

## 2014-03-13 MED ORDER — ISOSORBIDE MONONITRATE ER 60 MG PO TB24
60.0000 mg | ORAL_TABLET | Freq: Every day | ORAL | Status: DC
  Administered 2014-03-14: 60 mg via ORAL
  Filled 2014-03-13: qty 1

## 2014-03-13 MED ORDER — OXYBUTYNIN CHLORIDE 5 MG PO TABS
5.0000 mg | ORAL_TABLET | Freq: Every day | ORAL | Status: DC
  Administered 2014-03-14: 11:00:00 5 mg via ORAL
  Filled 2014-03-13: qty 1

## 2014-03-13 MED ORDER — FINASTERIDE 5 MG PO TABS
5.0000 mg | ORAL_TABLET | Freq: Every day | ORAL | Status: DC
  Administered 2014-03-14: 5 mg via ORAL
  Filled 2014-03-13: qty 1

## 2014-03-13 MED ORDER — TICAGRELOR 90 MG PO TABS
90.0000 mg | ORAL_TABLET | Freq: Two times a day (BID) | ORAL | Status: DC
  Administered 2014-03-14 (×2): 90 mg via ORAL
  Filled 2014-03-13 (×3): qty 1

## 2014-03-13 MED ORDER — BIVALIRUDIN 250 MG IV SOLR
INTRAVENOUS | Status: AC
  Filled 2014-03-13: qty 250

## 2014-03-13 MED ORDER — METOPROLOL SUCCINATE ER 50 MG PO TB24
50.0000 mg | ORAL_TABLET | Freq: Every day | ORAL | Status: DC
  Administered 2014-03-14: 50 mg via ORAL
  Filled 2014-03-13: qty 1

## 2014-03-13 MED ORDER — TICAGRELOR 90 MG PO TABS
ORAL_TABLET | ORAL | Status: AC
  Filled 2014-03-13: qty 2

## 2014-03-13 MED ORDER — SODIUM CHLORIDE 0.9 % IV SOLN
INTRAVENOUS | Status: DC
  Administered 2014-03-13: 10:00:00 via INTRAVENOUS

## 2014-03-13 MED ORDER — SODIUM CHLORIDE 0.9 % IV SOLN: INTRAVENOUS | Status: DC

## 2014-03-13 MED ORDER — SODIUM CHLORIDE 0.9 % IJ SOLN: 3.0000 mL | INTRAMUSCULAR | Status: DC | PRN

## 2014-03-13 MED ORDER — ONDANSETRON HCL 4 MG/2ML IJ SOLN: 4.0000 mg | Freq: Four times a day (QID) | INTRAMUSCULAR | Status: DC | PRN

## 2014-03-13 MED ORDER — ACETAMINOPHEN 325 MG PO TABS
650.0000 mg | ORAL_TABLET | ORAL | Status: DC | PRN
  Administered 2014-03-14: 05:00:00 650 mg via ORAL
  Filled 2014-03-13: qty 2

## 2014-03-13 MED ORDER — SODIUM CHLORIDE 0.9 % IV SOLN: 1.0000 mL/kg/h | INTRAVENOUS | Status: AC

## 2014-03-13 MED ORDER — RAMIPRIL 5 MG PO CAPS
5.0000 mg | ORAL_CAPSULE | Freq: Every day | ORAL | Status: DC
  Filled 2014-03-13: qty 1

## 2014-03-13 MED ORDER — ASPIRIN 81 MG PO CHEW
81.0000 mg | CHEWABLE_TABLET | Freq: Every day | ORAL | Status: DC
  Administered 2014-03-14: 11:00:00 81 mg via ORAL
  Filled 2014-03-13: qty 1

## 2014-03-13 MED ORDER — HEPARIN (PORCINE) IN NACL 2-0.9 UNIT/ML-% IJ SOLN
INTRAMUSCULAR | Status: AC
  Filled 2014-03-13: qty 1000

## 2014-03-13 MED ORDER — HEPARIN SODIUM (PORCINE) 1000 UNIT/ML IJ SOLN
INTRAMUSCULAR | Status: AC
  Filled 2014-03-13: qty 1

## 2014-03-13 MED ORDER — HYDRALAZINE HCL 20 MG/ML IJ SOLN
10.0000 mg | Freq: Four times a day (QID) | INTRAMUSCULAR | Status: DC | PRN
Start: 2014-03-13 — End: 2014-03-14
  Administered 2014-03-13 – 2014-03-14 (×2): 10 mg via INTRAVENOUS
  Filled 2014-03-13 (×2): qty 1

## 2014-03-13 MED ORDER — NITROGLYCERIN 1 MG/10 ML FOR IR/CATH LAB
INTRA_ARTERIAL | Status: AC
  Filled 2014-03-13: qty 10

## 2014-03-13 MED ORDER — LIDOCAINE HCL (PF) 1 % IJ SOLN
INTRAMUSCULAR | Status: AC
  Filled 2014-03-13: qty 30

## 2014-03-13 MED ORDER — PANTOPRAZOLE SODIUM 40 MG PO TBEC
40.0000 mg | DELAYED_RELEASE_TABLET | Freq: Every day | ORAL | Status: DC
  Administered 2014-03-14: 11:00:00 40 mg via ORAL
  Filled 2014-03-13: qty 1

## 2014-03-13 NOTE — H&P (View-Only) (Signed)
[name] Date of Birth: [birth year] Medical Record #[id number]  History of Present Illness: [name] is seen for evaluation of chest pain. He has a known history of coronary disease and is status post CABG in 1996. This included an LIMA graft to the LAD and diagonal, sequential vein graft to the acute marginal, PDA, and posterior lateral branches of the right coronary, sequential saphenous vein graft to the obtuse marginal vessel and distal circumflex. He had a cardiac catheterization in 2007 which showed that his grafts were all patent. He has chronic chest pain. This does respond to nitrate therapy. His pain is described as a right precordial pain. It mostly occurs when he is lying down. He reports that this pain is getting worse. He may take up to 10 sl Ntg a night. No clear triggers and nothing else seems to give him relief. His last Myoview study in January showed a lateral wall scar with no ischemia. He had cholecystectomy in January without complications and without relief of his pain. CXR was unremarkable. CT of the chest was negative for PE or aortic pathology. He had EGD by Dr. Oletta Lamas that showed Barrett's esophagus but no evidence of esophagitis. He is on Protonix. If is very frustrated with his continued chest pain.  Current Outpatient Prescriptions on File Prior to Visit  Medication Sig Dispense Refill  . Ascorbic Acid (VITAMIN C PO) Take 1,000 mg by mouth daily.     Marland Kitchen aspirin EC 325 MG tablet Take 325 mg by mouth daily.    Marland Kitchen atorvastatin (LIPITOR) 80 MG tablet Take 1 tablet (80 mg total) by mouth daily. 90 tablet 3  . Cholecalciferol (VITAMIN D PO) Take 5,000 Units by mouth daily.     . finasteride (PROSCAR) 5 MG tablet Take 1 tablet (5 mg total) by mouth daily. 90 tablet 3  . isosorbide mononitrate (IMDUR) 60 MG 24 hr tablet Take 1 tablet (60 mg total) by mouth daily. 90 tablet 3  . metoprolol succinate (TOPROL-XL) 50 MG 24 hr tablet Take 1 tablet (50 mg total) by mouth daily.  Take with or immediately following a meal. 90 tablet 3  . nitroGLYCERIN (NITROSTAT) 0.4 MG SL tablet Place 1 tablet (0.4 mg total) under the tongue every 5 (five) minutes as needed for chest pain (up to 3 doses. if pain persist call 911). 100 tablet 6  . oxybutynin (DITROPAN) 5 MG tablet Take 5 mg by mouth daily.     . pantoprazole (PROTONIX) 40 MG tablet Take 1 tablet (40 mg total) by mouth daily. 90 tablet 3  . ramipril (ALTACE) 5 MG capsule Take 1 capsule (5 mg total) by mouth daily. 90 capsule 3  . valACYclovir (VALTREX) 500 MG tablet Take 500 mg by mouth daily as needed (outbreak).      No current facility-administered medications on file prior to visit.    No Known Allergies  Past Medical History  Diagnosis Date  . Coronary artery disease   . FH: CABG (coronary artery bypass surgery) 1996  . History of obesity   . Hypercholesterolemia   . History of hypertension   . History of hiatal hernia   . History of kidney stones   . History of erectile dysfunction   . Gastroesophageal reflux disease   . Sleep apnea   . History of prostatitis   . Hypertension     Past Surgical History  Procedure Laterality Date  . Coronary artery bypass graft  1996  . Cardiac catheterization  Ejection fraction is estimated at 45%  . Colon surgery    . Cholecystectomy N/A 01/23/2014    Procedure: LAPAROSCOPIC CHOLECYSTECTOMY WITH INTRAOPERATIVE CHOLANGIOGRAM ;  Surgeon: Fanny Skates, MD;  Location: [facility];  Service: General;  Laterality: N/A;    History  Smoking status  . Former Smoker -- 15 years  . Quit date: 01/12/1997  Smokeless tobacco  . Not on file    Comment: 3/4 pack per day. smoked 15 years off and on    History  Alcohol Use  . 0.0 oz/week  . 0 Standard drinks or equivalent per week    Comment: 1 per week    Family History  Problem Relation Age of Onset  . Heart attack Father   . Cancer Sister     Uterine Cancer    Review of Systems: As noted in HPI.  All other  systems were reviewed and are negative.  Physical Exam: BP 124/78 mmHg  Pulse 65  Ht 5\' 10"  (1.778 m)  Wt 212 lb 8 oz (96.389 kg)  BMI 30.49 kg/m2 He is a pleasant white male in no acute distress.The patient is alert and oriented x 3.    The skin is warm and dry.   The HEENT exam is normal.  The carotids are 2+ without bruits.  There is no thyromegaly.  There is no JVD.  The lungs are clear.  The chest wall is non tender.  The heart exam reveals a regular rate with a normal S1 and S2.  There are no murmurs, gallops, or rubs.  The PMI is not displaced.   Abdominal exam reveals good bowel sounds.  There is no guarding or rebound.  There is no hepatosplenomegaly or tenderness.  There are no masses.  Exam of the legs reveal no clubbing, cyanosis, or edema.  The legs are without rashes.  The distal pulses are intact.  Cranial nerves II - XII are intact.  Motor and sensory functions are intact.  The gait is normal.  LABORATORY DATA: ECG today demonstrates normal sinus rhythm with occasional PVCs. He has a chronic left bundle branch block.  Assessment / Plan: 1. Coronary disease with remote coronary bypass surgery in 1996. Cardiac catheterization 2007 showed patent grafts. Myoview study in January 2015 showed no ischemia with lateral infarct. Continues to have significant chest pain requiring high use of nitrates. He is on optimal medical therapy. He has had extensive evaluation for other causes of chest pain with no clear etiology. I have recommended a cardiac cath. He is 20 years post CABG. If grafts are patent then we will need to treat him for chronic chest pain. Would consider Neurontin or muscle relaxant.   2. Chronic chest pain. Symptoms are consistent with a GI source. Prior EGD showed Barrett's esophagus but otherwise no acute findings. This may all be related to his gallstones.  He does get relief with sublingual nitroglycerin. Continue Protonix. Continue to avoid foods that exacerbate his  symptoms.  3. Left bundle branch block  4. Hyperlipidemia.  5. Obesity.   6. Barrett'e esophagus  7. S/p cholecystectomy.

## 2014-03-13 NOTE — Interval H&P Note (Signed)
History and Physical Interval Note:  03/13/2014 12:11 PM  [name]  has presented today for surgery, with the diagnosis of cp  The various methods of treatment have been discussed with the patient and family. After consideration of risks, benefits and other options for treatment, the patient has consented to  Procedure(s): LEFT HEART CATHETERIZATION WITH CORONARY/GRAFT ANGIOGRAM (N/A) as a surgical intervention .  The patient's history has been reviewed, patient examined, no change in status, stable for surgery.  I have reviewed the patient's chart and labs.  Questions were answered to the patient's satisfaction.   Cath Lab Visit (complete for each Cath Lab visit)  Clinical Evaluation Leading to the Procedure:   ACS: No.  Non-ACS:    Anginal Classification: CCS III  Anti-ischemic medical therapy: Maximal Therapy (2 or more classes of medications)  Non-Invasive Test Results: No non-invasive testing performed  Prior CABG: Previous CABG        [name] [facility] 03/13/2014 12:11 PM

## 2014-03-13 NOTE — CV Procedure (Signed)
    Cardiac Catheterization Procedure Note  Name: [name] MRN: [id number] DOB: 09/12/1945  Procedure: Selective Coronary Angiography, SVG and LIMA angiography,  PTCA and stenting of the SVG to the OM.  Indication: 70 yo WM with remote CABG 20 years ago presents with refractory angina.   Procedural Details:  The left wrist was prepped, draped, and anesthetized with 1% lidocaine. Using the modified Seldinger technique, a 6 French slender sheath was introduced into the left radial artery. 3 mg of verapamil was administered through the sheath, weight-based unfractionated heparin was administered intravenously. Standard Judkins catheters were used for selective coronary angiography and graft angiography. Catheter exchanges were performed over an exchange length guidewire. Due to tortuosity in the left subclavian and radial spasm it was very difficult to manipulate the catheters. The native RCA was not visualized. We did not perform LV assessment due to inability to advance pigtail catheter into the LV.   PROCEDURAL FINDINGS Hemodynamics: AO 153/75 mean 104 mm Hg    Coronary angiography: Coronary dominance: right  Left mainstem: 100% occluded proximally.  Left anterior descending (LAD): occluded.  Left circumflex (LCx): occluded.  Right coronary artery (RCA): occluded  SVG to the first and second OM branches is patent. The first OM is very small and occluded distal to the graft. The second OM is a larger branch. There is a 90% eccentric stenosis in the SVG just past the first OM.   SVG sequential the the RV marginal/PDA/PLOM is widely patent.   LIMA to the LAD is widely patent.    PCI Note:  Following the diagnostic procedure, the decision was made to proceed with PCI of the SVG to OM2.  Weight-based bivalirudin was given for anticoagulation. Brilinta 180 mg was given orally.  Once a therapeutic ACT was achieved, a 6 French LCB guide catheter was inserted.  A Filter coronary  guidewire was used to cross the lesion adn the filter was deployed in the distal SVG.  The lesion was predilated with a 2.5 mm balloon.  The lesion was then stented with a 3.5 x 16 mm Promus stent.  The stent was postdilated with a 3.75 mm noncompliant balloon. The filter was then retrieved without difficulty.   Following PCI, there was 0% residual stenosis and TIMI-3 flow. Final angiography confirmed an excellent result. The patient tolerated the procedure well. There were no immediate procedural complications. A TR band was used for radial hemostasis. The patient was transferred to the post catheterization recovery area for further monitoring.  PCI Data: Vessel - SVG to OM 2/Segment - mid Percent Stenosis (pre)  90% TIMI-flow 3 Stent 3.5 x 16 mm Promus Percent Stenosis (post) 0% TIMI-flow (post) 3  Final Conclusions:   1. Occlusive native vessel CAD 2. Patent LIMA to the LAD 3. Patent SVG to RV marginal, PDA, and PLOM of the RCA 4. Patent SVG to OM2 with severe stenosis in mid graft.  5. Successful stenting of the SVG to OM 2 with DES.   Recommendations:  DAPT for one year. Echo to assess LV function. Anticipate DC in am.   [name], [place] 03/13/2014, 1:43 PM

## 2014-03-13 NOTE — Progress Notes (Signed)
Ate lunch plate.

## 2014-03-14 DIAGNOSIS — I25729 Atherosclerosis of autologous artery coronary artery bypass graft(s) with unspecified angina pectoris: Secondary | ICD-10-CM

## 2014-03-14 DIAGNOSIS — I2582 Chronic total occlusion of coronary artery: Secondary | ICD-10-CM | POA: Diagnosis not present

## 2014-03-14 DIAGNOSIS — I2510 Atherosclerotic heart disease of native coronary artery without angina pectoris: Secondary | ICD-10-CM | POA: Diagnosis not present

## 2014-03-14 DIAGNOSIS — I25810 Atherosclerosis of coronary artery bypass graft(s) without angina pectoris: Secondary | ICD-10-CM | POA: Diagnosis not present

## 2014-03-14 DIAGNOSIS — Z7982 Long term (current) use of aspirin: Secondary | ICD-10-CM | POA: Diagnosis not present

## 2014-03-14 LAB — CBC
HCT: 43.8 % (ref 39.0–52.0)
Hemoglobin: 14.9 g/dL (ref 13.0–17.0)
MCH: 30.3 pg (ref 26.0–34.0)
MCHC: 34 g/dL (ref 30.0–36.0)
MCV: 89 fL (ref 78.0–100.0)
PLATELETS: 216 10*3/uL (ref 150–400)
RBC: 4.92 MIL/uL (ref 4.22–5.81)
RDW: 12.7 % (ref 11.5–15.5)
WBC: 8.8 10*3/uL (ref 4.0–10.5)

## 2014-03-14 LAB — BASIC METABOLIC PANEL
Anion gap: 9 (ref 5–15)
BUN: 12 mg/dL (ref 6–23)
CALCIUM: 9.2 mg/dL (ref 8.4–10.5)
CO2: 23 mmol/L (ref 19–32)
CREATININE: 0.8 mg/dL (ref 0.50–1.35)
Chloride: 106 mmol/L (ref 96–112)
GFR, EST NON AFRICAN AMERICAN: 88 mL/min — AB (ref >90)
GLUCOSE: 106 mg/dL — AB (ref 70–99)
Potassium: 3.8 mmol/L (ref 3.5–5.1)
Sodium: 138 mmol/L (ref 135–145)

## 2014-03-14 MED ORDER — RAMIPRIL 10 MG PO CAPS: 10.0000 mg | ORAL_CAPSULE | Freq: Every day | ORAL | Status: DC

## 2014-03-14 MED ORDER — TICAGRELOR 90 MG PO TABS: 90.0000 mg | ORAL_TABLET | Freq: Two times a day (BID) | ORAL | Status: DC

## 2014-03-14 MED ORDER — ASPIRIN 81 MG PO TBEC: 81.0000 mg | DELAYED_RELEASE_TABLET | Freq: Every day | ORAL | Status: DC

## 2014-03-14 MED ORDER — POTASSIUM CHLORIDE ER 10 MEQ PO TBCR
20.0000 meq | EXTENDED_RELEASE_TABLET | Freq: Once | ORAL | Status: AC
  Administered 2014-03-14: 20 meq via ORAL
  Filled 2014-03-14: qty 2

## 2014-03-14 MED ORDER — RAMIPRIL 10 MG PO CAPS
10.0000 mg | ORAL_CAPSULE | Freq: Every day | ORAL | Status: DC
  Administered 2014-03-14: 10 mg via ORAL
  Filled 2014-03-14 (×2): qty 1

## 2014-03-14 MED FILL — Sodium Chloride IV Soln 0.9%: INTRAVENOUS | Qty: 50 | Status: AC

## 2014-03-14 NOTE — Progress Notes (Signed)
CARDIAC REHAB PHASE I   PRE:  Rate/Rhythm: 72 SR frequent PVC's  BP:  Supine:   Sitting: 165/74  Standing:    SaO2:   MODE:  Ambulation: 1000 ft   POST:  Rate/Rhythm: 85 SR with frequent PVC's  BP:  Supine:   Sitting: 184/99  Standing:    SaO2:  0920-1020 Pt tolerated ambulation well without c/o of cp or SOB. BP elevated after walk reported to nurse. Pt states that he has not had his mediations this morning. Completed stent discharge education with pt and wife. Pt voices understanding. He declines Outpt. CRP not interested. Pt states that he exercises daily and is a member of a gym. I encouraged him to continue to exercise and I gave him guidelines on how to get restarted. Preston Langton RN 03/14/2014 10:16 AM

## 2014-03-14 NOTE — Discharge Instructions (Signed)
PLEASE REMEMBER TO BRING ALL OF YOUR MEDICATIONS TO EACH OF YOUR FOLLOW-UP OFFICE VISITS. ° °PLEASE ATTEND ALL SCHEDULED FOLLOW-UP APPOINTMENTS.  ° °Activity: Increase activity slowly as tolerated. You may shower, but no soaking baths (or swimming) for 1 week. No driving for 2 days. No lifting over 5 lbs for 1 week. No sexual activity for 1 week.  ° °You May Return to Work: in 1 week (if applicable) ° °Wound Care: You may wash cath site gently with soap and water. Keep cath site clean and dry. If you notice pain, swelling, bleeding or pus at your cath site, please call [phone]. ° ° ° °Cardiac Cath Site Care °Refer to this sheet in the next few weeks. These instructions provide you with information on caring for yourself after your procedure. Your caregiver may also give you more specific instructions. Your treatment has been planned according to current medical practices, but problems sometimes occur. Call your caregiver if you have any problems or questions after your procedure. °HOME CARE INSTRUCTIONS °· You may shower 24 hours after the procedure. Remove the bandage (dressing) and gently wash the site with plain soap and water. Gently pat the site dry.  °· Do not apply powder or lotion to the site.  °· Do not sit in a bathtub, swimming pool, or whirlpool for 5 to 7 days.  °· No bending, squatting, or lifting anything over 10 pounds (4.5 kg) as directed by your caregiver.  °· Inspect the site at least twice daily.  °· Do not drive home if you are discharged the same day of the procedure. Have someone else drive you.  °· You may drive 24 hours after the procedure unless otherwise instructed by your caregiver.  °What to expect: °· Any bruising will usually fade within 1 to 2 weeks.  °· Blood that collects in the tissue (hematoma) may be painful to the touch. It should usually decrease in size and tenderness within 1 to 2 weeks.  °SEEK IMMEDIATE MEDICAL CARE IF: °· You have unusual pain at the site or down the  affected limb.  °· You have redness, warmth, swelling, or pain at the site.  °· You have drainage (other than a small amount of blood on the dressing).  °· You have chills.  °· You have a fever or persistent symptoms for more than 72 hours.  °· You have a fever and your symptoms suddenly get worse.  °· Your leg becomes pale, cool, tingly, or numb.  °· You have heavy bleeding from the site. Hold pressure on the site.  °Document Released: 01/31/2010 Document Revised: 12/18/2010 Document Reviewed: 01/31/2010 °ExitCare® Patient Information ©2012 [employer]. ° °

## 2014-03-14 NOTE — Progress Notes (Signed)
  Echocardiogram 2D Echocardiogram has been performed.  [name] 03/14/2014, 8:58 AM

## 2014-03-14 NOTE — Progress Notes (Signed)
Pt has been having freq multifocal pvc's since yest but is now having bigeminy and trigeminy.  K was 3.8 yest. Cbc and Bmet ordered for this am.  Pt also with c/o abd cramping and voiding a lot.  Dr Karlyn Agee notified and discussed pt's course of events thus far.  Orders received for 34meq kcl.  Pt has required 2 doses of hydralazine this shift, once at 8pm and at 0630 this am.  BP's 355-732 systolic and currently 202/54.

## 2014-03-14 NOTE — Care Management Note (Signed)
    Page 1 of 1   03/14/2014     10:40:48 AM CARE MANAGEMENT NOTE 03/14/2014  Patient:  KENN, REKOWSKI   Account Number:  [id number]  Date Initiated:  03/14/2014  Documentation initiated by:  GRAVES-BIGELOW,Darragh Nay  Subjective/Objective Assessment:   Pt admitted for cp. Plan for d/c home.     Action/Plan:   Plan for d/c on brilinta-CM did call [employer]. 30 day free card provided to pt.   Anticipated DC Date:  03/14/2014   Anticipated DC Plan:  [place]  CM consult  Medication Assistance      Choice offered to / List presented to:             Status of service:  Completed, signed off Medicare Important Message given?  NO (If response is "NO", the following Medicare IM given date fields will be blank) Date Medicare IM given:   Medicare IM given by:   Date Additional Medicare IM given:   Additional Medicare IM given by:    Discharge Disposition:  HOME/SELF CARE  Per UR Regulation:  Reviewed for med. necessity/level of care/duration of stay  If discussed at [place] of Stay Meetings, dates discussed:    Comments:  per rep at [facility] rx:  brilinta: tier 3/ no auth required/ $45 30 day retail/ $125 90 day mail order  patient can use: [facility], [facility], [facility], [facility], and [facility]   Pt aware of cost and medication is available.

## 2014-03-14 NOTE — Discharge Summary (Signed)
CARDIOLOGY DISCHARGE SUMMARY   Patient ID: [name] MRN: [id number] DOB/AGE: 14-Apr-1945 69 y.o.  Admit date: 03/13/2014 Discharge date: 03/14/2014  PCP: Peter [name], MD Primary Cardiologist: Dr. [name]  Primary Discharge Diagnosis:    Atherosclerosis of autologous artery coronary artery bypass graft with angina pectoris  -   Secondary Discharge Diagnosis:    Coronary artery disease   FH: CABG (coronary artery bypass surgery)   Hypercholesterolemia   History of hypertension   LBBB (left bundle branch block)   Angina pectoris  Procedures: Selective Coronary Angiography, SVG and LIMA angiography, PTCA and stenting of the SVG to the OM, 2-D echocardiogram  Hospital Course: [name] is a 69 y.o. male with a history of CAD and CABG. He was having chest pain that was increasing in intensity and frequency but responsive to nitroglycerin. He was having continued chest pain and was scheduled for cardiac catheterization. He came to the hospital for the procedure on 03/13/2014.  Cardiac catheterization results are below. He had severe native three-vessel disease with the left mainstem LAD circumflex and RCA all occluded. The SVG to OM1 and OM 2 was patent but the first OM was occluded just distal to the graft. There was a 90% stenosis in the second limb of the SVG. The SVG-RV/PDA/PLOM was patent and the LIMA to LAD was patent. He had a drug-eluting stent placed in the distal limb of the SVG-OM 1-OM 2 and tolerated the procedure well.  On 03/14/2014, he was seen by Dr. [name] and all data were reviewed. His post procedure labs were stable. His ECG has left bundle branch block at baseline and is unchanged. He is not having chest pain or shortness of breath with ambulation. This cath site is without ecchymosis or hematoma. Once his echocardiogram is obtained, he is considered stable for discharge, to follow up as an outpatient.  BP 152/65 mmHg  Pulse 64  Temp(Src) 97.8 F (36.6  C) (Oral)  Resp 20  Ht 5\' 10"  (1.778 m)  Wt 205 lb 0.4 oz (93 kg)  BMI 29.42 kg/m2  SpO2 94% General: Well developed, well nourished, male in no acute distress Head: Eyes PERRLA, No xanthomas.   Normocephalic and atraumatic  Lungs: Clear bilaterally to auscultation. Heart: HRRR S1 S2, without MRG.  Pulses are 2+ & equal. No carotid bruit. No JVD.  Abdomen: Bowel sounds are present, abdomen soft and non-tender without masses or  hernias noted. Msk: Normal strength and tone for age. Extremities: No clubbing, cyanosis or no edema.  Left radial cath site without ecchymosis or hematoma  Skin:  No rashes or lesions noted. Neuro: Alert and oriented X 3. Psych:  Good affect, responds appropriately   Labs:   Lab Results  Component Value Date   WBC 8.8 03/14/2014   HGB 14.9 03/14/2014   HCT 43.8 03/14/2014   MCV 89.0 03/14/2014   PLT 216 03/14/2014     Recent Labs Lab 03/14/14 0455  NA 138  K 3.8  CL 106  CO2 23  BUN 12  CREATININE 0.80  CALCIUM 9.2  GLUCOSE 106*    Cardiac Cath: 03/13/2014 PROCEDURAL FINDINGS Hemodynamics: AO 153/75 mean 104 mm Hg Coronary angiography: Coronary dominance: right Left mainstem: 100% occluded proximally. Left anterior descending (LAD): occluded. Left circumflex (LCx): occluded. Right coronary artery (RCA): occluded SVG to the first and second OM branches is patent. The first OM is very small and occluded distal to the graft. The second OM is a larger  branch. There is a 90% eccentric stenosis in the SVG just past the first OM.  SVG sequential the the RV marginal/PDA/PLOM is widely patent.  LIMA to the LAD is widely patent.  PCI Note: Following the diagnostic procedure, the decision was made to proceed with PCI of the SVG to OM2. Weight-based bivalirudin was given for anticoagulation. Brilinta 180 mg was given orally. Once a therapeutic ACT was achieved, a 6 French LCB guide catheter was inserted. A Filter coronary guidewire  was used to cross the lesion adn the filter was deployed in the distal SVG. The lesion was predilated with a 2.5 mm balloon. The lesion was then stented with a 3.5 x 16 mm Promus stent. The stent was postdilated with a 3.75 mm noncompliant balloon. The filter was then retrieved without difficulty. Following PCI, there was 0% residual stenosis and TIMI-3 flow. Final angiography confirmed an excellent result. The patient tolerated the procedure well. There were no immediate procedural complications. A TR band was used for radial hemostasis. The patient was transferred to the post catheterization recovery area for further monitoring. PCI Data: Vessel - SVG to OM 2/Segment - mid Percent Stenosis (pre) 90% TIMI-flow 3 Stent 3.5 x 16 mm Promus Percent Stenosis (post) 0% TIMI-flow (post) 3 Final Conclusions:  1. Occlusive native vessel CAD 2. Patent LIMA to the LAD 3. Patent SVG to RV marginal, PDA, and PLOM of the RCA 4. Patent SVG to OM2 with severe stenosis in mid graft.  5. Successful stenting of the SVG to OM 2 with DES.  Recommendations:  DAPT for one year. Echo to assess LV function.  EKG: 03/14/2014 Sinus rhythm with PVCs, left bundle branch block is old  Echo: 03/14/2014 - Left ventricle: The LV IS MODERATELY DILATED. I can not see endocardium well. There is some septal dyssyneergy from IVCD. There is suggestion of decreased motion of the inferolateral wall and the anterolateral wall. I can not be sure about the EF. Possibly 35-45% range. Doppler parameters are consistent with high ventricular filling pressure. - Aortic valve: Significant calcification. Considering LV function and gradients and visual, there is probably moderate stenosis. Mean gradient (S): 19 mm Hg. Peak gradient (S): 36 mm Hg. - Left atrium: The atrium was moderately to severely dilated. - Right ventricle: The cavity size was normal. Systolic function was normal. - Right atrium: The  atrium was mildly dilated.  FOLLOW UP PLANS AND APPOINTMENTS No Known Allergies   Medication List    TAKE these medications        aspirin 81 MG EC tablet  Take 1 tablet (81 mg total) by mouth daily.     atorvastatin 80 MG tablet  Commonly known as:  LIPITOR  Take 1 tablet (80 mg total) by mouth daily.     finasteride 5 MG tablet  Commonly known as:  PROSCAR  Take 1 tablet (5 mg total) by mouth daily.     ibuprofen 200 MG tablet  Commonly known as:  ADVIL,MOTRIN  Take 600 mg by mouth every 6 (six) hours as needed (pain).     isosorbide mononitrate 60 MG 24 hr tablet  Commonly known as:  IMDUR  Take 1 tablet (60 mg total) by mouth daily.     metoprolol succinate 50 MG 24 hr tablet  Commonly known as:  TOPROL-XL  Take 1 tablet (50 mg total) by mouth daily. Take with or immediately following a meal.     nitroGLYCERIN 0.4 MG SL tablet  Commonly known as:  NITROSTAT  Place 1 tablet (0.4 mg total) under the tongue every 5 (five) minutes as needed for chest pain (up to 3 doses. if pain persist call 911).     oxybutynin 5 MG tablet  Commonly known as:  DITROPAN  Take 5 mg by mouth daily.     pantoprazole 40 MG tablet  Commonly known as:  PROTONIX  Take 1 tablet (40 mg total) by mouth daily.     ramipril 10 MG capsule  Commonly known as:  ALTACE  Take 1 capsule (10 mg total) by mouth daily.     ticagrelor 90 MG Tabs tablet  Commonly known as:  BRILINTA  Take 1 tablet (90 mg total) by mouth 2 (two) times daily.     valACYclovir 500 MG tablet  Commonly known as:  VALTREX  Take 500 mg by mouth daily as needed (outbreak).     VITAMIN C PO  Take 1,000 mg by mouth daily.     VITAMIN D PO  Take 5,000 Units by mouth daily.        Discharge Instructions    Diet - low sodium heart healthy    Complete by:  As directed      Increase activity slowly    Complete by:  As directed           Follow-up Information    Follow up with Peter [name], MD.   Specialty:   Cardiology   Why:  The office will call.   Contact information:   [address] [place] [address] [phone]       BRING ALL MEDICATIONS WITH YOU TO FOLLOW UP APPOINTMENTS  Time spent with patient to include physician time: 49 min Signed: Rosaria Ferries, PA-C 03/14/2014, 10:53 AM Co-Sign MD

## 2014-04-02 ENCOUNTER — Encounter: Payer: Self-pay | Admitting: Cardiology

## 2014-04-02 ENCOUNTER — Ambulatory Visit (INDEPENDENT_AMBULATORY_CARE_PROVIDER_SITE_OTHER): Payer: Medicare Other | Admitting: Cardiology

## 2014-04-02 VITALS — BP 112/62 | HR 57 | Ht 70.5 in | Wt 213.9 lb

## 2014-04-02 DIAGNOSIS — I25709 Atherosclerosis of coronary artery bypass graft(s), unspecified, with unspecified angina pectoris: Secondary | ICD-10-CM

## 2014-04-02 MED ORDER — RAMIPRIL 5 MG PO CAPS: 5.0000 mg | ORAL_CAPSULE | Freq: Every day | ORAL | Status: DC

## 2014-04-02 MED ORDER — PANTOPRAZOLE SODIUM 40 MG PO TBEC: 40.0000 mg | DELAYED_RELEASE_TABLET | Freq: Two times a day (BID) | ORAL | Status: DC

## 2014-04-02 NOTE — Progress Notes (Signed)
Cardiology Office Note   Date:  04/02/2014   ID:  [name], DOB 1945/02/08, MRN [id number]  PCP:  Peter [name], MD  Cardiologist:  Dr. P. [name]   Chief Complaint  Patient presents with  . post cardiac cath    post Cath 03/13/14, still having CP when lying down at night. dyspnea when bending over on occasion.       History of Present Illness: [name] is a 69 y.o. male who presents for post hospitalization for chest pain and post cath and with stent to VG to OM.   Final Conclusions:  1. Occlusive native vessel CAD 2. Patent LIMA to the LAD 3. Patent SVG to RV marginal, PDA, and PLOM of the RCA 4. Patent SVG to OM2 with severe stenosis in mid graft.  5. Successful stenting of the SVG to OM 2 with DES.   He complains of chest pain today, similar to previous pain though not nearly as severe.  Again we discussed possibility of GERD causing.  He also has brief episodes of SOB- may be Brilinta. If the SOB becomes more severe he will contact us, but if mild he will continue to take.  He has missed a Brilinta on 2 occasions.  Discussed importance of not missing doses.  Ie: clot heart attack.    He is status post CABG in 1996. This included an LIMA graft to the LAD and diagonal, sequential vein graft to the acute marginal, PDA, and posterior lateral branches of the right coronary, sequential saphenous vein graft to the obtuse marginal vessel and distal circumflex. He had a cardiac catheterization in 2007 which showed that his grafts were all patent. He had chronic chest pain. This does respond to nitrate therapy. His pain is described as a right precordial pain. It mostly occurs when he is lying down. He reported that this pain is getting worse. He may take up to 10 sl Ntg a night. No clear triggers and nothing else seems to give him relief. His last Myoview study in January showed a lateral wall scar with no ischemia. He had cholecystectomy in January without complications and  without relief of his pain. CXR was unremarkable. CT of the chest was negative for PE or aortic pathology. He had EGD by Dr. Oletta Lamas that showed Barrett's esophagus but no evidence of esophagitis. He is on Protonix. He was very frustrated with his continued chest pain. Pt admitted electively for cardiac cath with results as above.   ECHO: Study Conclusions  - Left ventricle: The LV IS MODERATELY DILATED. I can not see endocardium well. There is some septal dyssyneergy from IVCD. There is suggestion of decreased motion of the inferolateral wall and the anterolateral wall. I can not be sure about the EF. Possibly 35-45% range. Doppler parameters are consistent with high ventricular filling pressure. - Aortic valve: Significant calcification. Considering LV function and gradients and visual, there is probably moderate stenosis. Mean gradient (S): 19 mm Hg. Peak gradient (S): 36 mm Hg. - Left atrium: The atrium was moderately to severely dilated. - Right ventricle: The cavity size was normal. Systolic function was normal. - Right atrium: The atrium was mildly dilated.   Past Medical History  Diagnosis Date  . Coronary artery disease   . History of obesity   . Hypercholesterolemia   . History of hiatal hernia   . History of kidney stones   . History of erectile dysfunction   . Gastroesophageal reflux disease   .  History of prostatitis   . Hypertension   . Kidney stones   . Heart murmur   . OSA (obstructive sleep apnea)     "suppose to wear mask; I don't" (03/13/2014)    Past Surgical History  Procedure Laterality Date  . Cholecystectomy N/A 01/23/2014    Procedure: LAPAROSCOPIC CHOLECYSTECTOMY WITH INTRAOPERATIVE CHOLANGIOGRAM ;  Surgeon: Fanny Skates, MD;  Location: [place];  Service: General;  Laterality: N/A;  . Coronary artery bypass graft  1996    "CABG X 7"  . Cardiac catheterization  1996; ~ 2006    ; Ejection fraction is estimated at 45%  . Coronary  angioplasty with stent placement  03/13/2014    "1"  . Tonsillectomy  ~ 1950  . Colonoscopy w/ polypectomy    . Cystoscopy with stent placement    . Left heart catheterization with coronary/graft angiogram N/A 03/13/2014    Procedure: LEFT HEART CATHETERIZATION WITH Beatrix Fetters;  Surgeon: [name] [name], MD;  Location: [facility] CATH LAB;  Service: Cardiovascular;  Laterality: N/A;     Current Outpatient Prescriptions  Medication Sig Dispense Refill  . Ascorbic Acid (VITAMIN C PO) Take 1,000 mg by mouth daily.     Marland Kitchen aspirin EC 81 MG EC tablet Take 1 tablet (81 mg total) by mouth daily.    Marland Kitchen atorvastatin (LIPITOR) 80 MG tablet Take 1 tablet (80 mg total) by mouth daily. 90 tablet 3  . Cholecalciferol (VITAMIN D PO) Take 5,000 Units by mouth daily.     . finasteride (PROSCAR) 5 MG tablet Take 1 tablet (5 mg total) by mouth daily. 90 tablet 3  . ibuprofen (ADVIL,MOTRIN) 200 MG tablet Take 600 mg by mouth every 6 (six) hours as needed (pain).    . isosorbide mononitrate (IMDUR) 60 MG 24 hr tablet Take 1 tablet (60 mg total) by mouth daily. 90 tablet 3  . metoprolol succinate (TOPROL-XL) 50 MG 24 hr tablet Take 1 tablet (50 mg total) by mouth daily. Take with or immediately following a meal. 90 tablet 3  . nitroGLYCERIN (NITROSTAT) 0.4 MG SL tablet Place 1 tablet (0.4 mg total) under the tongue every 5 (five) minutes as needed for chest pain (up to 3 doses. if pain persist call 911). 100 tablet 6  . oxybutynin (DITROPAN) 5 MG tablet Take 5 mg by mouth daily.     . pantoprazole (PROTONIX) 40 MG tablet Take 1 tablet (40 mg total) by mouth daily. 90 tablet 3  . ramipril (ALTACE) 10 MG capsule Take 1 capsule (10 mg total) by mouth daily. 30 capsule 11  . ticagrelor (BRILINTA) 90 MG TABS tablet Take 1 tablet (90 mg total) by mouth 2 (two) times daily. 60 tablet 11  . valACYclovir (VALTREX) 500 MG tablet Take 500 mg by mouth daily as needed (outbreak).      No current facility-administered  medications for this visit.    Allergies:   Review of patient's allergies indicates no known allergies.    Social History:  The patient  reports that he quit smoking about 17 years ago. His smoking use included Cigarettes. He has a 11.25 pack-year smoking history. He has never used smokeless tobacco. He reports that he drinks about 2.4 oz of alcohol per week. He reports that he does not use illicit drugs.   Family History:  The patient's family history includes Cancer in his sister; Heart attack in his father.    ROS:  General:no colds or fevers, no significant weight changes Skin:no rashes  or ulcers HEENT:no blurred vision, no congestion CV:see HPI PUL:see HPI GI:no diarrhea constipation or melena, no indigestion GU:no hematuria, no dysuria MS:no joint pain, no claudication Neuro:no syncope, + lightheadedness and dizziness with bending over, position change, though increased from before.  Endo:no diabetes, no thyroid disease  Wt Readings from Last 3 Encounters:  04/02/14 213 lb 14.4 oz (97.024 kg)  03/14/14 205 lb 0.4 oz (93 kg)  03/06/14 212 lb 8 oz (96.389 kg)     PHYSICAL EXAM: VS:  BP 112/62 mmHg  Pulse 57  Ht 5' 10.5" (1.791 m)  Wt 213 lb 14.4 oz (97.024 kg)  BMI 30.25 kg/m2 , BMI Body mass index is 30.25 kg/(m^2). General:Pleasant affect, NAD Skin:Warm and dry, brisk capillary refill HEENT:normocephalic, sclera clear, mucus membranes moist Neck:supple, no JVD, no bruits  Heart:S1S2 RRR with 2/6 systolic murmur, gallup, no rub or click Lungs:clear without rales, rhonchi, or wheezes JQG:BEEF, non tender, + BS, do not palpate liver spleen or masses Ext:no lower ext edema, 2+ pedal pulses, 2+ radial pulses Neuro:alert and oriented, MAE, follows commands, + facial symmetry    EKG:  EKG is ordered today. The ekg ordered today demonstrates LBBB Bradycardia at 57 PVCs, 1st degree AV block.    Recent Labs: 01/18/2014: ALT 36 03/14/2014: BUN 12; Creatinine 0.80;  Hemoglobin 14.9; Platelets 216; Potassium 3.8; Sodium 138    Lipid Panel    Component Value Date/Time   CHOL 146 08/03/2012 0949   TRIG 106.0 08/03/2012 0949   HDL 39.20 08/03/2012 0949   CHOLHDL 4 08/03/2012 0949   VLDL 21.2 08/03/2012 0949   LDLCALC 86 08/03/2012 0949       Other studies Reviewed: Additional studies/ records that were reviewed today include: hospital records   ASSESSMENT AND PLAN:  Coronary artery disease with hx CABG in 1996 now with angina and cardiac cath revealing VG disease and placement of STENT to VG->OM2 with DES Promus.  Now on Brilinta- discussed importance of not missing any meds.  He will follow up with Dr. P. [name] in 4-6 weeks.  Chest pain- improved since stents but continues just less strong.  With hx of GERD and Barrett's I am increasing protonix 40 mg to BID he will try this for 2 weeks to see if it helps.    Hypercholesterolemia- on lipitor 80 will need lipid panel on next visit.   History of hypertension during hospitalization his BP was elevated, today lower with episodes of positional dizziness, will decrease the altace back to 5 mg daily.   LBBB (left bundle branch block)- chronic  1st degree AV block  stable   AS -moderate degree  LV dysfunction with EF 35-45%, may be able to increase the altace at later date.     Current medicines are reviewed with the patient today.  The patient Has no concerns regarding medicines.  The following changes have been made:  See above Labs/ tests ordered today include:see above  Disposition:   FU:  see above  Lennie Muckle, NP  04/02/2014 9:44 AM    [facility] [place], [place], [place], [place] Phone: [phone]; Fax: [phone]

## 2014-04-02 NOTE — Patient Instructions (Signed)
DECREASE your Altace to 5mg  daily  INCREASE your Protonix to Twice Daily.  Your physician recommends that you schedule a follow-up appointment in 4-6 weeks with Dr.Jordan

## 2014-04-11 ENCOUNTER — Other Ambulatory Visit: Payer: Self-pay | Admitting: Cardiology

## 2014-04-12 ENCOUNTER — Other Ambulatory Visit: Payer: Self-pay

## 2014-04-12 MED ORDER — NITROGLYCERIN 0.4 MG SL SUBL: 0.4000 mg | SUBLINGUAL_TABLET | SUBLINGUAL | Status: DC | PRN

## 2014-05-22 ENCOUNTER — Ambulatory Visit (INDEPENDENT_AMBULATORY_CARE_PROVIDER_SITE_OTHER): Payer: Medicare Other | Admitting: Cardiology

## 2014-05-22 ENCOUNTER — Encounter: Payer: Self-pay | Admitting: Cardiology

## 2014-05-22 VITALS — BP 128/70 | HR 76 | Ht 70.0 in | Wt 208.7 lb

## 2014-05-22 DIAGNOSIS — I447 Left bundle-branch block, unspecified: Secondary | ICD-10-CM

## 2014-05-22 DIAGNOSIS — I209 Angina pectoris, unspecified: Secondary | ICD-10-CM

## 2014-05-22 DIAGNOSIS — K219 Gastro-esophageal reflux disease without esophagitis: Secondary | ICD-10-CM

## 2014-05-22 DIAGNOSIS — I25708 Atherosclerosis of coronary artery bypass graft(s), unspecified, with other forms of angina pectoris: Secondary | ICD-10-CM | POA: Diagnosis not present

## 2014-05-22 DIAGNOSIS — R0789 Other chest pain: Secondary | ICD-10-CM | POA: Diagnosis not present

## 2014-05-22 MED ORDER — NITROGLYCERIN 0.4 MG SL SUBL: 0.4000 mg | SUBLINGUAL_TABLET | SUBLINGUAL | Status: DC | PRN

## 2014-05-22 MED ORDER — DICYCLOMINE HCL 10 MG PO CAPS
10.0000 mg | ORAL_CAPSULE | Freq: Every evening | ORAL | Status: DC | PRN
Start: 2014-05-22 — End: 2014-12-15

## 2014-05-22 MED ORDER — CLOPIDOGREL BISULFATE 75 MG PO TABS: 75.0000 mg | ORAL_TABLET | Freq: Every day | ORAL | Status: DC

## 2014-05-22 NOTE — Patient Instructions (Signed)
We will switch Brilinta to Plavix 75 mg daily  Try Bentyl 10 mg in the evening prn pain/spasm- let me know if this helps.  I will see you in 4 months.

## 2014-05-22 NOTE — Progress Notes (Signed)
[name] Date of Birth: 09/14/1945 Medical Record #[id number]  History of Present Illness: [name] is seen for follow up of chest pain. He has a known history of coronary disease and is status post CABG in 1996. This included an LIMA graft to the LAD and diagonal, sequential vein graft to the acute marginal, PDA, and posterior lateral branches of the right coronary, sequential saphenous vein graft to the obtuse marginal vessel and distal circumflex. He had a cardiac catheterization in 2007 which showed that his grafts were all patent. On 04/02/14 he had repeat cath for chest pain and grafts were again patent but there was a 90% stenosis in the SVG to OM2. This was stented with a DES.  Despite this intervention he has continued to have chest pain as before. He had cholecystectomy in January without complications and without relief of his pain. CXR was unremarkable. CT of the chest was negative for PE or aortic pathology. He had EGD by Dr. Oletta Lamas that showed Barrett's esophagus but no evidence of esophagitis. He is on Protonix. His pain occurs at rest and mostly at night and is relieved with sl Ntg. He can still work all day in his yard without problems but at night may take up to 10 Ntg. No improvement with doubling Protonix dose. Symptoms worse after a heavier meal. Also notes sense of breathlessness on Brilinta.   Current Outpatient Prescriptions on File Prior to Visit  Medication Sig Dispense Refill  . Ascorbic Acid (VITAMIN C PO) Take 1,000 mg by mouth daily.     Marland Kitchen aspirin EC 81 MG EC tablet Take 1 tablet (81 mg total) by mouth daily.    Marland Kitchen atorvastatin (LIPITOR) 80 MG tablet Take 1 tablet (80 mg total) by mouth daily. 90 tablet 3  . Cholecalciferol (VITAMIN D PO) Take 5,000 Units by mouth daily.     . finasteride (PROSCAR) 5 MG tablet Take 1 tablet (5 mg total) by mouth daily. 90 tablet 3  . ibuprofen (ADVIL,MOTRIN) 200 MG tablet Take 600 mg by mouth every 6 (six) hours as needed (pain).     . isosorbide mononitrate (IMDUR) 60 MG 24 hr tablet Take 1 tablet (60 mg total) by mouth daily. 90 tablet 3  . metoprolol succinate (TOPROL-XL) 50 MG 24 hr tablet Take 1 tablet (50 mg total) by mouth daily. Take with or immediately following a meal. 90 tablet 3  . oxybutynin (DITROPAN) 5 MG tablet Take 5 mg by mouth daily.     . pantoprazole (PROTONIX) 40 MG tablet Take 1 tablet (40 mg total) by mouth 2 (two) times daily. 180 tablet 3  . ramipril (ALTACE) 5 MG capsule Take 1 capsule (5 mg total) by mouth daily. 90 capsule 3  . valACYclovir (VALTREX) 500 MG tablet Take 500 mg by mouth daily as needed (outbreak).      No current facility-administered medications on file prior to visit.    No Known Allergies  Past Medical History  Diagnosis Date  . Coronary artery disease   . History of obesity   . Hypercholesterolemia   . History of hiatal hernia   . History of kidney stones   . History of erectile dysfunction   . Gastroesophageal reflux disease   . History of prostatitis   . Hypertension   . Kidney stones   . Heart murmur   . OSA (obstructive sleep apnea)     "suppose to wear mask; I don't" (03/13/2014)    Past Surgical History  Procedure Laterality Date  . Cholecystectomy N/A 01/23/2014    Procedure: LAPAROSCOPIC CHOLECYSTECTOMY WITH INTRAOPERATIVE CHOLANGIOGRAM ;  Surgeon: Fanny Skates, MD;  Location: [place];  Service: General;  Laterality: N/A;  . Coronary artery bypass graft  1996    "CABG X 7"  . Cardiac catheterization  1996; ~ 2006    ; Ejection fraction is estimated at 45%  . Coronary angioplasty with stent placement  03/13/2014    "1"  . Tonsillectomy  ~ 1950  . Colonoscopy w/ polypectomy    . Cystoscopy with stent placement    . Left heart catheterization with coronary/graft angiogram N/A 03/13/2014    Procedure: LEFT HEART CATHETERIZATION WITH Beatrix Fetters;  Surgeon: Natash Berman M [name], MD;  Location: [facility] CATH LAB;  Service: Cardiovascular;  Laterality: N/A;     History  Smoking status  . Former Smoker -- 0.75 packs/day for 15 years  . Types: Cigarettes  . Quit date: 01/12/1997  Smokeless tobacco  . Never Used    Comment: 3/4 pack per day. smoked 15 years off and on    History  Alcohol Use  . 2.4 oz/week  . 0 Standard drinks or equivalent, 4 Cans of beer per week    Family History  Problem Relation Age of Onset  . Heart attack Father   . Cancer Sister     Uterine Cancer    Review of Systems: As noted in HPI.  All other systems were reviewed and are negative.  Physical Exam: BP 128/70 mmHg  Pulse 76  Ht 5\' 10"  (1.778 m)  Wt 208 lb 11.2 oz (94.666 kg)  BMI 29.95 kg/m2 He is a pleasant white male in no acute distress.The patient is alert and oriented x 3.    The skin is warm and dry.   The HEENT exam is normal.  The carotids are 2+ without bruits.  There is no thyromegaly.  There is no JVD.  The lungs are clear.  The chest wall is non tender.  The heart exam reveals a regular rate with a normal S1 and S2.  There are no murmurs, gallops, or rubs.  The PMI is not displaced.   Abdominal exam reveals good bowel sounds.  There is no guarding or rebound.  There is no hepatosplenomegaly or tenderness.  There are no masses.  Exam of the legs reveal no clubbing, cyanosis, or edema.  The legs are without rashes.  The distal pulses are intact.  Cranial nerves II - XII are intact.  Motor and sensory functions are intact.  The gait is normal.  LABORATORY DATA: Cardiac Catheterization Procedure Note  Name: [name] MRN: [id number] DOB: March 08, 1945  Procedure: Selective Coronary Angiography, SVG and LIMA angiography, PTCA and stenting of the SVG to the OM.  Indication: 69 yo WM with remote CABG 20 years ago presents with refractory angina.   Procedural Details: The left wrist was prepped, draped, and anesthetized with 1% lidocaine. Using the modified Seldinger technique, a 6 French slender sheath was introduced into the left radial  artery. 3 mg of verapamil was administered through the sheath, weight-based unfractionated heparin was administered intravenously. Standard Judkins catheters were used for selective coronary angiography and graft angiography. Catheter exchanges were performed over an exchange length guidewire. Due to tortuosity in the left subclavian and radial spasm it was very difficult to manipulate the catheters. The native RCA was not visualized. We did not perform LV assessment due to inability to advance pigtail catheter into the LV.  PROCEDURAL FINDINGS Hemodynamics: AO 153/75 mean 104 mm Hg   Coronary angiography: Coronary dominance: right  Left mainstem: 100% occluded proximally.  Left anterior descending (LAD): occluded.  Left circumflex (LCx): occluded.  Right coronary artery (RCA): occluded  SVG to the first and second OM branches is patent. The first OM is very small and occluded distal to the graft. The second OM is a larger branch. There is a 90% eccentric stenosis in the SVG just past the first OM.   SVG sequential the the RV marginal/PDA/PLOM is widely patent.   LIMA to the LAD is widely patent.    PCI Note: Following the diagnostic procedure, the decision was made to proceed with PCI of the SVG to OM2. Weight-based bivalirudin was given for anticoagulation. Brilinta 180 mg was given orally. Once a therapeutic ACT was achieved, a 6 French LCB guide catheter was inserted. A Filter coronary guidewire was used to cross the lesion adn the filter was deployed in the distal SVG. The lesion was predilated with a 2.5 mm balloon. The lesion was then stented with a 3.5 x 16 mm Promus stent. The stent was postdilated with a 3.75 mm noncompliant balloon. The filter was then retrieved without difficulty. Following PCI, there was 0% residual stenosis and TIMI-3 flow. Final angiography confirmed an excellent result. The patient tolerated the procedure well. There were no immediate  procedural complications. A TR band was used for radial hemostasis. The patient was transferred to the post catheterization recovery area for further monitoring.  PCI Data: Vessel - SVG to OM 2/Segment - mid Percent Stenosis (pre) 90% TIMI-flow 3 Stent 3.5 x 16 mm Promus Percent Stenosis (post) 0% TIMI-flow (post) 3  Final Conclusions:  1. Occlusive native vessel CAD 2. Patent LIMA to the LAD 3. Patent SVG to RV marginal, PDA, and PLOM of the RCA 4. Patent SVG to OM2 with severe stenosis in mid graft.  5. Successful stenting of the SVG to OM 2 with DES.   Recommendations:  DAPT for one year. Echo to assess LV function. Anticipate DC in am.   Tayja Manzer [name], [place] 03/13/2014, 1:43 PM  Assessment / Plan: 1. Coronary disease with remote coronary bypass surgery in 1996. Cardiac catheterization 2007 showed patent grafts. Myoview study in January 2015 showed no ischemia with lateral infarct. Recent cath showed high grade disease in SVG to OM. Despite successful PCI with DES symptoms have persisted. Due to dyspnea on Brilinta we will switch to Plavix.   2. Chronic chest pain. Symptoms are consistent with a GI source. ? Spasm. Prior EGD showed Barrett's esophagus but otherwise no acute findings.  He does get relief with sublingual nitroglycerin. Continue Protonix. Continue to avoid foods that exacerbate his symptoms. Will empirically try Bentyl 10 mg in the evening to see if this helps.   3. Left bundle branch block  4. Hyperlipidemia.  5. Obesity.   6. Barrett'e esophagus  7. S/p cholecystectomy.

## 2014-05-27 ENCOUNTER — Other Ambulatory Visit: Payer: Self-pay | Admitting: Cardiology

## 2014-06-06 ENCOUNTER — Other Ambulatory Visit: Payer: Self-pay | Admitting: Cardiology

## 2014-06-25 ENCOUNTER — Other Ambulatory Visit: Payer: Self-pay | Admitting: Cardiology

## 2014-06-25 ENCOUNTER — Other Ambulatory Visit: Payer: Self-pay | Admitting: *Deleted

## 2014-06-25 MED ORDER — FINASTERIDE 5 MG PO TABS: 5.0000 mg | ORAL_TABLET | Freq: Every day | ORAL | Status: DC

## 2014-07-02 ENCOUNTER — Other Ambulatory Visit: Payer: Self-pay | Admitting: Cardiology

## 2014-07-02 NOTE — Telephone Encounter (Signed)
E-sent pharmacy

## 2014-07-04 ENCOUNTER — Telehealth: Payer: Self-pay | Admitting: Cardiology

## 2014-07-04 NOTE — Telephone Encounter (Signed)
Received records from [facility] for appointment on 09/25/14 with Dr [name].  Records given to [facility] (medical records) for Dr Doug Sou schedule on 09/25/14. lp

## 2014-08-06 ENCOUNTER — Other Ambulatory Visit: Payer: Self-pay | Admitting: Cardiology

## 2014-08-07 ENCOUNTER — Other Ambulatory Visit: Payer: Self-pay

## 2014-08-09 ENCOUNTER — Other Ambulatory Visit: Payer: Self-pay | Admitting: Cardiology

## 2014-08-09 NOTE — Telephone Encounter (Signed)
REFILL 

## 2014-09-18 ENCOUNTER — Other Ambulatory Visit: Payer: Self-pay | Admitting: Cardiology

## 2014-09-25 ENCOUNTER — Ambulatory Visit (INDEPENDENT_AMBULATORY_CARE_PROVIDER_SITE_OTHER): Payer: Medicare Other | Admitting: Cardiology

## 2014-09-25 ENCOUNTER — Other Ambulatory Visit: Payer: Self-pay

## 2014-09-25 ENCOUNTER — Encounter: Payer: Self-pay | Admitting: Cardiology

## 2014-09-25 VITALS — BP 104/64 | HR 62 | Ht 70.0 in | Wt 210.1 lb

## 2014-09-25 DIAGNOSIS — I447 Left bundle-branch block, unspecified: Secondary | ICD-10-CM | POA: Diagnosis not present

## 2014-09-25 DIAGNOSIS — E7800 Pure hypercholesterolemia, unspecified: Secondary | ICD-10-CM

## 2014-09-25 DIAGNOSIS — I350 Nonrheumatic aortic (valve) stenosis: Secondary | ICD-10-CM

## 2014-09-25 DIAGNOSIS — I25729 Atherosclerosis of autologous artery coronary artery bypass graft(s) with unspecified angina pectoris: Secondary | ICD-10-CM

## 2014-09-25 DIAGNOSIS — Z8249 Family history of ischemic heart disease and other diseases of the circulatory system: Secondary | ICD-10-CM

## 2014-09-25 MED ORDER — METOPROLOL SUCCINATE ER 50 MG PO TB24: 50.0000 mg | ORAL_TABLET | Freq: Every day | ORAL | Status: DC

## 2014-09-25 MED ORDER — ISOSORBIDE MONONITRATE ER 60 MG PO TB24: 60.0000 mg | ORAL_TABLET | Freq: Every day | ORAL | Status: DC

## 2014-09-25 MED ORDER — NITROGLYCERIN 0.4 MG SL SUBL: 0.4000 mg | SUBLINGUAL_TABLET | SUBLINGUAL | Status: DC | PRN

## 2014-09-25 NOTE — Patient Instructions (Signed)
Continue your current therapy  We will see you in 6 months with repeat Echocardiogram

## 2014-09-25 NOTE — Progress Notes (Signed)
[name] Date of Birth: [birth year] Medical Record #[id number]  History of Present Illness: [name] is seen for follow up of CAD. He has a known history of coronary disease and is status post CABG in 1996. This included an LIMA graft to the LAD and diagonal, sequential vein graft to the acute marginal, PDA, and posterior lateral branches of the right coronary, sequential saphenous vein graft to the obtuse marginal vessel and distal circumflex. He had a cardiac catheterization in 2007 which showed that his grafts were all patent. On 04/02/14 he had repeat cath for chest pain and grafts were again patent but there was a 90% stenosis in the SVG to OM2. This was stented with a DES.  Despite this intervention he has continued to have chest pain. He had cholecystectomy in January without complications and without relief of his pain. CXR was unremarkable. CT of the chest was negative for PE or aortic pathology. He had EGD by Dr. Oletta Barnes that showed Barrett's esophagus but no evidence of esophagitis. He is on Protonix. His pain occurs at rest and mostly at night and is relieved with sl Ntg. It seems to be triggered by certain foods (spicy) and drinks (beer). He can still work all day in his yard without problems. He does get relief with sl Ntg.  No improvement with Bentyl.   Current Outpatient Prescriptions on File Prior to Visit  Medication Sig Dispense Refill  . Ascorbic Acid (VITAMIN C PO) Take 1,000 mg by mouth daily.     Marland Kitchen aspirin EC 81 MG EC tablet Take 1 tablet (81 mg total) by mouth daily.    Marland Kitchen atorvastatin (LIPITOR) 80 MG tablet TAKE 1 TABLET (80 MG TOTAL) BY MOUTH DAILY. 90 tablet 3  . Cholecalciferol (VITAMIN D PO) Take 5,000 Units by mouth daily.     . clopidogrel (PLAVIX) 75 MG tablet Take 1 tablet (75 mg total) by mouth daily. 90 tablet 3  . dicyclomine (BENTYL) 10 MG capsule Take 1 capsule (10 mg total) by mouth at bedtime as needed for spasms. 30 capsule 3  . finasteride (PROSCAR) 5 MG  tablet Take 1 tablet (5 mg total) by mouth daily. 90 tablet 3  . ibuprofen (ADVIL,MOTRIN) 200 MG tablet Take 600 mg by mouth every 6 (six) hours as needed (pain).    Marland Kitchen oxybutynin (DITROPAN) 5 MG tablet Take 5 mg by mouth daily.     . pantoprazole (PROTONIX) 40 MG tablet Take 1 tablet (40 mg total) by mouth 2 (two) times daily. 180 tablet 3  . ramipril (ALTACE) 5 MG capsule Take 1 capsule (5 mg total) by mouth daily. 90 capsule 3  . valACYclovir (VALTREX) 500 MG tablet Take 500 mg by mouth daily as needed (outbreak).      No current facility-administered medications on file prior to visit.    No Known Allergies  Past Medical History  Diagnosis Date  . Coronary artery disease   . History of obesity   . Hypercholesterolemia   . History of hiatal hernia   . History of kidney stones   . History of erectile dysfunction   . Gastroesophageal reflux disease   . History of prostatitis   . Hypertension   . Kidney stones   . Heart murmur   . OSA (obstructive sleep apnea)     "suppose to wear mask; I don't" (03/13/2014)  . Aortic stenosis, moderate     Past Surgical History  Procedure Laterality Date  . Cholecystectomy N/A 01/23/2014  Procedure: LAPAROSCOPIC CHOLECYSTECTOMY WITH INTRAOPERATIVE CHOLANGIOGRAM ;  Surgeon: Preston Skates, MD;  Location: [place];  Service: General;  Laterality: N/A;  . Coronary artery bypass graft  1996    "CABG X 7"  . Cardiac catheterization  1996; ~ 2006    ; Ejection fraction is estimated at 45%  . Coronary angioplasty with stent placement  03/13/2014    "1"  . Tonsillectomy  ~ 1950  . Colonoscopy w/ polypectomy    . Cystoscopy with stent placement    . Left heart catheterization with coronary/graft angiogram N/A 03/13/2014    Procedure: LEFT HEART CATHETERIZATION WITH Beatrix Fetters;  Surgeon: Preston M [name], MD;  Location: [facility] CATH LAB;  Service: Cardiovascular;  Laterality: N/A;    History  Smoking status  . Former Smoker -- 0.75 packs/day for  15 years  . Types: Cigarettes  . Quit date: 01/12/1997  Smokeless tobacco  . Never Used    Comment: 3/4 pack per day. smoked 15 years off and on    History  Alcohol Use  . 2.4 oz/week  . 0 Standard drinks or equivalent, 4 Cans of beer per week    Family History  Problem Relation Age of Onset  . Heart attack Father   . Cancer Sister     Uterine Cancer    Review of Systems: As noted in HPI.  All other systems were reviewed and are negative.  Physical Exam: BP 104/64 mmHg  Pulse 62  Ht 5\' 10"  (1.778 m)  Wt 95.301 kg (210 lb 1.6 oz)  BMI 30.15 kg/m2 He is a pleasant white male in no acute distress.The patient is alert and oriented x 3.    The skin is warm and dry.   The HEENT exam is normal.  The carotids are 2+ without bruits.  There is no thyromegaly.  There is no JVD.  The lungs are clear.  The chest wall is non tender.  The heart exam reveals a regular rate with a normal S1 and S2.  There is a harsh 2/6 systolic murmur in the RUSB. The PMI is not displaced.   Abdominal exam reveals good bowel sounds.  There is no guarding or rebound.  There is no hepatosplenomegaly or tenderness.  There are no masses.  Exam of the legs reveal no clubbing, cyanosis, or edema.  The legs are without rashes.  The distal pulses are intact.  Cranial nerves II - XII are intact.  Motor and sensory functions are intact.  The gait is normal.  LABORATORY DATA: Echo 03/14/14:Study Conclusions  - Left ventricle: The LV IS MODERATELY DILATED. I can not see endocardium well. There is some septal dyssyneergy from IVCD. There is suggestion of decreased motion of the inferolateral wall and the anterolateral wall. I can not be sure about the EF. Possibly 35-45% range. Doppler parameters are consistent with high ventricular filling pressure. - Aortic valve: Significant calcification. Considering LV function and gradients and visual, there is probably moderate stenosis. Mean gradient (S): 19 mm Hg.  Peak gradient (S): 36 mm Hg. - Left atrium: The atrium was moderately to severely dilated. - Right ventricle: The cavity size was normal. Systolic function was normal. - Right atrium: The atrium was mildly dilated.  Lab Results  Component Value Date   WBC 8.8 03/14/2014   HGB 14.9 03/14/2014   HCT 43.8 03/14/2014   PLT 216 03/14/2014   GLUCOSE 106* 03/14/2014   CHOL 146 08/03/2012   TRIG 106.0 08/03/2012   HDL 39.20  08/03/2012   LDLCALC 86 08/03/2012   ALT 36 01/18/2014   AST 44* 01/18/2014   NA 138 03/14/2014   K 3.8 03/14/2014   CL 106 03/14/2014   CREATININE 0.80 03/14/2014   BUN 12 03/14/2014   CO2 23 03/14/2014   INR 1.07 03/06/2014    Assessment / Plan: 1. Coronary disease with remote coronary bypass surgery in 1996. Cardiac catheterization 2007 showed patent grafts. Myoview study in January 2015 showed no ischemia with lateral infarct. Cath in March 2016 showed high grade disease in SVG to OM. Despite successful PCI with DES symptoms have persisted. Plan to continue DAPT for one year.   2. Chronic chest pain. Symptoms are consistent with a GI source.   He does get relief with sublingual nitroglycerin. Continue Protonix. Extensive evaluation for other causes has been negative.  Continue to avoid foods that exacerbate his symptoms.   3. Moderate aortic stenosis. Will repeat Echo in March to assess progression.  4. Hyperlipidemia. Will repeat fasting lab work in March.  5. Obesity.   6. Barrett'e esophagus  7. LBBB

## 2014-11-21 ENCOUNTER — Other Ambulatory Visit: Payer: Self-pay | Admitting: Cardiology

## 2014-12-13 ENCOUNTER — Other Ambulatory Visit: Payer: Self-pay | Admitting: Cardiology

## 2014-12-13 NOTE — Telephone Encounter (Signed)
REFILL 

## 2014-12-15 ENCOUNTER — Emergency Department (HOSPITAL_COMMUNITY): Payer: Medicare Other

## 2014-12-15 ENCOUNTER — Inpatient Hospital Stay (HOSPITAL_COMMUNITY)
Admission: EM | Admit: 2014-12-15 | Discharge: 2014-12-18 | DRG: 227 | Disposition: A | Discharge status: Home / Self Care | Payer: Medicare Other | Attending: Family Medicine | Admitting: Family Medicine

## 2014-12-15 ENCOUNTER — Telehealth: Payer: Self-pay | Admitting: Physician Assistant

## 2014-12-15 ENCOUNTER — Encounter (HOSPITAL_COMMUNITY): Payer: Self-pay

## 2014-12-15 DIAGNOSIS — R0989 Other specified symptoms and signs involving the circulatory and respiratory systems: Secondary | ICD-10-CM | POA: Diagnosis present

## 2014-12-15 DIAGNOSIS — G8929 Other chronic pain: Secondary | ICD-10-CM | POA: Diagnosis present

## 2014-12-15 DIAGNOSIS — I252 Old myocardial infarction: Secondary | ICD-10-CM

## 2014-12-15 DIAGNOSIS — I5022 Chronic systolic (congestive) heart failure: Secondary | ICD-10-CM | POA: Diagnosis present

## 2014-12-15 DIAGNOSIS — R079 Chest pain, unspecified: Secondary | ICD-10-CM | POA: Diagnosis not present

## 2014-12-15 DIAGNOSIS — E7800 Pure hypercholesterolemia, unspecified: Secondary | ICD-10-CM | POA: Diagnosis present

## 2014-12-15 DIAGNOSIS — K2270 Barrett's esophagus without dysplasia: Secondary | ICD-10-CM | POA: Diagnosis present

## 2014-12-15 DIAGNOSIS — Z9119 Patient's noncompliance with other medical treatment and regimen: Secondary | ICD-10-CM

## 2014-12-15 DIAGNOSIS — Z95810 Presence of automatic (implantable) cardiac defibrillator: Secondary | ICD-10-CM

## 2014-12-15 DIAGNOSIS — R55 Syncope and collapse: Secondary | ICD-10-CM | POA: Diagnosis present

## 2014-12-15 DIAGNOSIS — G4733 Obstructive sleep apnea (adult) (pediatric): Secondary | ICD-10-CM | POA: Diagnosis present

## 2014-12-15 DIAGNOSIS — I493 Ventricular premature depolarization: Secondary | ICD-10-CM | POA: Diagnosis present

## 2014-12-15 DIAGNOSIS — K219 Gastro-esophageal reflux disease without esophagitis: Secondary | ICD-10-CM | POA: Diagnosis present

## 2014-12-15 DIAGNOSIS — I2510 Atherosclerotic heart disease of native coronary artery without angina pectoris: Secondary | ICD-10-CM | POA: Diagnosis present

## 2014-12-15 DIAGNOSIS — I447 Left bundle-branch block, unspecified: Secondary | ICD-10-CM | POA: Diagnosis present

## 2014-12-15 DIAGNOSIS — I350 Nonrheumatic aortic (valve) stenosis: Secondary | ICD-10-CM | POA: Diagnosis not present

## 2014-12-15 DIAGNOSIS — I472 Ventricular tachycardia: Secondary | ICD-10-CM | POA: Diagnosis present

## 2014-12-15 DIAGNOSIS — Z951 Presence of aortocoronary bypass graft: Secondary | ICD-10-CM

## 2014-12-15 DIAGNOSIS — Z955 Presence of coronary angioplasty implant and graft: Secondary | ICD-10-CM

## 2014-12-15 DIAGNOSIS — I25119 Atherosclerotic heart disease of native coronary artery with unspecified angina pectoris: Secondary | ICD-10-CM | POA: Diagnosis present

## 2014-12-15 DIAGNOSIS — Z7902 Long term (current) use of antithrombotics/antiplatelets: Secondary | ICD-10-CM

## 2014-12-15 DIAGNOSIS — Z8679 Personal history of other diseases of the circulatory system: Secondary | ICD-10-CM

## 2014-12-15 DIAGNOSIS — E669 Obesity, unspecified: Secondary | ICD-10-CM | POA: Diagnosis present

## 2014-12-15 DIAGNOSIS — Z7982 Long term (current) use of aspirin: Secondary | ICD-10-CM

## 2014-12-15 DIAGNOSIS — Z6830 Body mass index (BMI) 30.0-30.9, adult: Secondary | ICD-10-CM

## 2014-12-15 DIAGNOSIS — I255 Ischemic cardiomyopathy: Secondary | ICD-10-CM | POA: Diagnosis present

## 2014-12-15 DIAGNOSIS — Z87891 Personal history of nicotine dependence: Secondary | ICD-10-CM

## 2014-12-15 DIAGNOSIS — E785 Hyperlipidemia, unspecified: Secondary | ICD-10-CM | POA: Diagnosis present

## 2014-12-15 DIAGNOSIS — I10 Essential (primary) hypertension: Secondary | ICD-10-CM | POA: Diagnosis present

## 2014-12-15 DIAGNOSIS — R001 Bradycardia, unspecified: Secondary | ICD-10-CM | POA: Diagnosis present

## 2014-12-15 HISTORY — DX: Ventricular premature depolarization: I49.3

## 2014-12-15 HISTORY — DX: Ulcer of esophagus without bleeding: K22.10

## 2014-12-15 HISTORY — DX: Left bundle-branch block, unspecified: I44.7

## 2014-12-15 LAB — URINALYSIS, ROUTINE W REFLEX MICROSCOPIC
BILIRUBIN URINE: NEGATIVE
Glucose, UA: NEGATIVE mg/dL
Hgb urine dipstick: NEGATIVE
Ketones, ur: NEGATIVE mg/dL
LEUKOCYTES UA: NEGATIVE
NITRITE: NEGATIVE
PH: 7 (ref 5.0–8.0)
Protein, ur: NEGATIVE mg/dL
SPECIFIC GRAVITY, URINE: 1.018 (ref 1.005–1.030)

## 2014-12-15 LAB — BASIC METABOLIC PANEL
ANION GAP: 8 (ref 5–15)
BUN: 21 mg/dL — ABNORMAL HIGH (ref 6–20)
CALCIUM: 9.3 mg/dL (ref 8.9–10.3)
CO2: 25 mmol/L (ref 22–32)
Chloride: 107 mmol/L (ref 101–111)
Creatinine, Ser: 0.84 mg/dL (ref 0.61–1.24)
GLUCOSE: 129 mg/dL — AB (ref 65–99)
POTASSIUM: 3.9 mmol/L (ref 3.5–5.1)
Sodium: 140 mmol/L (ref 135–145)

## 2014-12-15 LAB — CBC
HEMATOCRIT: 43.6 % (ref 39.0–52.0)
HEMOGLOBIN: 14.9 g/dL (ref 13.0–17.0)
MCH: 31.2 pg (ref 26.0–34.0)
MCHC: 34.2 g/dL (ref 30.0–36.0)
MCV: 91.2 fL (ref 78.0–100.0)
Platelets: 189 10*3/uL (ref 150–400)
RBC: 4.78 MIL/uL (ref 4.22–5.81)
RDW: 12.3 % (ref 11.5–15.5)
WBC: 9.2 10*3/uL (ref 4.0–10.5)

## 2014-12-15 LAB — I-STAT TROPONIN, ED: TROPONIN I, POC: 0 ng/mL (ref 0.00–0.08)

## 2014-12-15 LAB — TROPONIN I

## 2014-12-15 LAB — MAGNESIUM: MAGNESIUM: 1.8 mg/dL (ref 1.7–2.4)

## 2014-12-15 MED ORDER — OXYBUTYNIN CHLORIDE 5 MG PO TABS
5.0000 mg | ORAL_TABLET | Freq: Every day | ORAL | Status: DC
  Administered 2014-12-16 – 2014-12-18 (×3): 5 mg via ORAL
  Filled 2014-12-15 (×3): qty 1

## 2014-12-15 MED ORDER — SODIUM CHLORIDE 0.9 % IJ SOLN
3.0000 mL | Freq: Two times a day (BID) | INTRAMUSCULAR | Status: DC
  Administered 2014-12-15 – 2014-12-18 (×4): 3 mL via INTRAVENOUS

## 2014-12-15 MED ORDER — RAMIPRIL 5 MG PO CAPS
5.0000 mg | ORAL_CAPSULE | Freq: Every day | ORAL | Status: DC
  Administered 2014-12-16 – 2014-12-18 (×3): 5 mg via ORAL
  Filled 2014-12-15 (×3): qty 1

## 2014-12-15 MED ORDER — ACETAMINOPHEN 650 MG RE SUPP: 650.0000 mg | Freq: Four times a day (QID) | RECTAL | Status: DC | PRN

## 2014-12-15 MED ORDER — PANTOPRAZOLE SODIUM 40 MG PO TBEC
40.0000 mg | DELAYED_RELEASE_TABLET | Freq: Two times a day (BID) | ORAL | Status: DC
  Administered 2014-12-15 – 2014-12-18 (×6): 40 mg via ORAL
  Filled 2014-12-15 (×6): qty 1

## 2014-12-15 MED ORDER — CLOPIDOGREL BISULFATE 75 MG PO TABS
75.0000 mg | ORAL_TABLET | Freq: Every day | ORAL | Status: DC
  Administered 2014-12-16 – 2014-12-18 (×3): 75 mg via ORAL
  Filled 2014-12-15 (×3): qty 1

## 2014-12-15 MED ORDER — ACETAMINOPHEN 325 MG PO TABS: 650.0000 mg | ORAL_TABLET | Freq: Four times a day (QID) | ORAL | Status: DC | PRN

## 2014-12-15 MED ORDER — ENOXAPARIN SODIUM 40 MG/0.4ML ~~LOC~~ SOLN
40.0000 mg | SUBCUTANEOUS | Status: DC
  Administered 2014-12-15: 40 mg via SUBCUTANEOUS
  Filled 2014-12-15: qty 0.4

## 2014-12-15 MED ORDER — NITROGLYCERIN 0.4 MG SL SUBL: 0.4000 mg | SUBLINGUAL_TABLET | SUBLINGUAL | Status: DC | PRN

## 2014-12-15 MED ORDER — FINASTERIDE 5 MG PO TABS
5.0000 mg | ORAL_TABLET | Freq: Every day | ORAL | Status: DC
  Administered 2014-12-16 – 2014-12-18 (×3): 5 mg via ORAL
  Filled 2014-12-15 (×3): qty 1

## 2014-12-15 MED ORDER — ALBUTEROL SULFATE (2.5 MG/3ML) 0.083% IN NEBU: 2.5000 mg | INHALATION_SOLUTION | RESPIRATORY_TRACT | Status: DC | PRN

## 2014-12-15 MED ORDER — SODIUM CHLORIDE 0.9 % IV SOLN
INTRAVENOUS | Status: AC
  Administered 2014-12-15: 23:00:00 via INTRAVENOUS

## 2014-12-15 MED ORDER — ATORVASTATIN CALCIUM 80 MG PO TABS
80.0000 mg | ORAL_TABLET | Freq: Every day | ORAL | Status: DC
Start: 2014-12-16 — End: 2014-12-18
  Administered 2014-12-16 – 2014-12-17 (×2): 80 mg via ORAL
  Filled 2014-12-15 (×2): qty 1

## 2014-12-15 MED ORDER — ASPIRIN EC 81 MG PO TBEC
81.0000 mg | DELAYED_RELEASE_TABLET | Freq: Every day | ORAL | Status: DC
  Administered 2014-12-16 – 2014-12-18 (×3): 81 mg via ORAL
  Filled 2014-12-15 (×3): qty 1

## 2014-12-15 MED ORDER — ISOSORBIDE MONONITRATE ER 60 MG PO TB24
60.0000 mg | ORAL_TABLET | Freq: Every day | ORAL | Status: DC
  Administered 2014-12-16 – 2014-12-18 (×3): 60 mg via ORAL
  Filled 2014-12-15 (×3): qty 1

## 2014-12-15 MED ORDER — METOPROLOL SUCCINATE ER 50 MG PO TB24
50.0000 mg | ORAL_TABLET | Freq: Every day | ORAL | Status: DC
  Administered 2014-12-16 – 2014-12-18 (×3): 50 mg via ORAL
  Filled 2014-12-15 (×3): qty 1

## 2014-12-15 NOTE — H&P (Signed)
History and Physical  [name] [id number] DOB: [birth year] DOA: 12/15/2014  Referring physician: Delila Pereyra, ED PA-C PCP: Peter [name], MD  Outpatient Specialists:  1. Not known  Chief Complaint: Passed out  HPI: [name] is a 69 y.o. male , married, independent of activities of daily living, PMH of CAD status post CABG in 1996, Cardiac cath March 2016 showed high-grade disease in SVG to OM and despite successful PCI with DES has chronic chest pain-now suspected to be of GI origin and relieved by sublingual NTG, moderate aortic stenosis, HLD, obesity, Barrett's esophagus, LBBB, GERD, HTN, OSA noncompliant with CPAP, presented to [facility] ED on 12/15/14 with episodes of passing out. Patient and spouse at bedside provided history. Patient was in his usual state of health this morning. He exercised on treadmill for 30 minutes after which he went out shopping with his wife. On returning home at approximately 11:30 AM, he bent down to pick up his dog and on getting upright, felt dizzy, leaned for support against the door and the next thing he remembers waking up on the floor. His wife was in another room, heard the fall and rash right away and noted him lying face forward. No seizure-like activity. LOC lasted approximately 1-2 minutes. On waking up he felt generally unwell. Subsequently he was sitting on his couch when his wife heard a snort and noted patient to be staring blankly in space and unresponsive. There is no lip smacking or twitching or tonic-clonic activity. This episode lasted approximately 30 seconds at the end of which she woke up and slumped onto the couch. He had a similar episode approximately one month ago when both of them were looking for their dog and he was standing up and looking down and eventually laid down to get the dog which was underneath the couch. His wife was looking from the opposite side of the couch and suddenly noted patient make a snorting sound  followed by blank stare which lasted about 30 seconds and was associated with urinary incontinence. No tongue biting. After today's episode, they called cardiologist's office who advised them to come to the ED for further evaluation. Currently patient feels tired but no specific complaints. He has chronic pain in the right side of his chest mostly at night which has been extensively evaluated and felt to be gastric in etiology and is relieved with sublingual nitroglycerin. He denies family history of seizures. In the ED, negative orthostatic vital signs, lab work unremarkable, POC troponin 1 negative, chest x-ray without acute cardiopulmonary disease and CT head shows no acute intracranial findings. EDP discussed with cardiologist who recommended overnight observation for evaluation and management. [facility] were consulted for admission.   Review of Systems: All systems reviewed and apart from history of presenting illness, are negative.  Past Medical History  Diagnosis Date  . Coronary artery disease   . History of obesity   . Hypercholesterolemia   . History of hiatal hernia   . History of kidney stones   . History of erectile dysfunction   . Gastroesophageal reflux disease   . History of prostatitis   . Hypertension   . Kidney stones   . Heart murmur   . OSA (obstructive sleep apnea)     "suppose to wear mask; I don't" (03/13/2014)  . Aortic stenosis, moderate    Past Surgical History  Procedure Laterality Date  . Cholecystectomy N/A 01/23/2014    Procedure: LAPAROSCOPIC CHOLECYSTECTOMY WITH INTRAOPERATIVE CHOLANGIOGRAM ;  Surgeon: Fanny Skates, MD;  Location: [place];  Service: General;  Laterality: N/A;  . Coronary artery bypass graft  1996    "CABG X 7"  . Cardiac catheterization  1996; ~ 2006    ; Ejection fraction is estimated at 45%  . Coronary angioplasty with stent placement  03/13/2014    "1"  . Tonsillectomy  ~ 1950  . Colonoscopy w/ polypectomy    . Cystoscopy with  stent placement    . Left heart catheterization with coronary/graft angiogram N/A 03/13/2014    Procedure: LEFT HEART CATHETERIZATION WITH Beatrix Fetters;  Surgeon: [name] [name], MD;  Location: [facility] CATH LAB;  Service: Cardiovascular;  Laterality: N/A;   Social History:  reports that he quit smoking about 17 years ago. His smoking use included Cigarettes. He has a 11.25 pack-year smoking history. He has never used smokeless tobacco. He reports that he drinks about 2.4 oz of alcohol per week. He reports that he does not use illicit drugs.  patient smokes a small amount hiding from his spouse. He drinks occasionally.   No Known Allergies  Family History  Problem Relation Age of Onset  . Heart attack Father   . Cancer Sister     Uterine Cancer    Prior to Admission medications   Medication Sig Start Date End Date Taking? Authorizing Provider  Ascorbic Acid (VITAMIN C PO) Take 1,000 mg by mouth daily.    Yes Historical Provider, MD  aspirin EC 81 MG EC tablet Take 1 tablet (81 mg total) by mouth daily. 03/14/14  Yes Rhonda G Barrett, PA-C  atorvastatin (LIPITOR) 80 MG tablet TAKE 1 TABLET (80 MG TOTAL) BY MOUTH DAILY. 08/06/14  Yes [name] [name], MD  Cholecalciferol (VITAMIN D PO) Take 5,000 Units by mouth daily.    Yes Historical Provider, MD  clopidogrel (PLAVIX) 75 MG tablet Take 1 tablet (75 mg total) by mouth daily. 05/22/14  Yes [name] [name], MD  finasteride (PROSCAR) 5 MG tablet Take 1 tablet (5 mg total) by mouth daily. 06/25/14  Yes [name] [name], MD  ibuprofen (ADVIL,MOTRIN) 200 MG tablet Take 600 mg by mouth every 6 (six) hours as needed (pain).   Yes Historical Provider, MD  isosorbide mononitrate (IMDUR) 60 MG 24 hr tablet Take 1 tablet (60 mg total) by mouth daily. 09/25/14  Yes [name] [name], MD  metoprolol succinate (TOPROL-XL) 50 MG 24 hr tablet Take 1 tablet (50 mg total) by mouth daily. Take with or immediately following a meal. 09/25/14  Yes [name] [name], MD    nitroGLYCERIN (NITROSTAT) 0.4 MG SL tablet PLACE 1 TABLET (0.4 MG TOTAL) UNDER THE TONGUE EVERY 5 (FIVE) MINUTES AS NEEDED FOR CHEST PAIN. 12/13/14  Yes [name] [name], MD  oxybutynin (DITROPAN) 5 MG tablet Take 5 mg by mouth daily.  07/04/12  Yes Historical Provider, MD  pantoprazole (PROTONIX) 40 MG tablet Take 1 tablet (40 mg total) by mouth 2 (two) times daily. 04/02/14  Yes Isaiah Serge, NP  ramipril (ALTACE) 5 MG capsule Take 1 capsule (5 mg total) by mouth daily. 04/02/14  Yes Isaiah Serge, NP  valACYclovir (VALTREX) 500 MG tablet Take 500 mg by mouth daily as needed (outbreak).  04/10/13  Yes Historical Provider, MD   Physical Exam: Filed Vitals:   12/15/14 1600 12/15/14 1800 12/15/14 1807 12/15/14 1815  BP: 183/88 171/89 180/89 155/89  Pulse: 64 75 66   Temp:      TempSrc:  Resp: 15 18 16    Height:      SpO2: 97% 95% 98%    temperature: 98.66F.   General exam: Moderately built and nourished pleasant elderly male patient, lying comfortably supine on the gurney in no obvious distress.  Head, eyes and ENT: Nontraumatic and normocephalic. Pupils equally reacting to light and accommodation. Oral mucosa moist. Mild superficial bruising of the left malar area .  Neck: Supple. No JVD, carotid bruit or thyromegaly.  Lymphatics: No lymphadenopathy.  Respiratory system: Clear to auscultation. No increased work of breathing. Midline sternotomy scar. Grade 2 x 6 systolic ejection murmur best heard at apex.   Cardiovascular system: S1 and S2 heard, RRR. No JVD, gallops, clicks or pedal edema.  Gastrointestinal system: Abdomen is nondistended, soft and nontender. Normal bowel sounds heard. No organomegaly or masses appreciated.  Central nervous system: Alert and oriented. No focal neurological deficits.  Extremities: Symmetric 5 x 5 power. Peripheral pulses symmetrically felt.   Skin: No rashes or acute findings.  Musculoskeletal system: Negative exam.  Psychiatry: Pleasant  and cooperative.   Labs on Admission:  Basic Metabolic Panel:  Recent Labs Lab 12/15/14 1520  NA 140  K 3.9  CL 107  CO2 25  GLUCOSE 129*  BUN 21*  CREATININE 0.84  CALCIUM 9.3   Liver Function Tests: No results for input(s): AST, ALT, ALKPHOS, BILITOT, PROT, ALBUMIN in the last 168 hours. No results for input(s): LIPASE, AMYLASE in the last 168 hours. No results for input(s): AMMONIA in the last 168 hours. CBC:  Recent Labs Lab 12/15/14 1520  WBC 9.2  HGB 14.9  HCT 43.6  MCV 91.2  PLT 189   Cardiac Enzymes: No results for input(s): CKTOTAL, CKMB, CKMBINDEX, TROPONINI in the last 168 hours.  BNP (last 3 results) No results for input(s): PROBNP in the last 8760 hours. CBG: No results for input(s): GLUCAP in the last 168 hours.  Radiological Exams on Admission: Dg Chest 2 View  12/15/2014  CLINICAL DATA:  Seizure-like activity. Syncopal episode with head injury. EXAM: CHEST  2 VIEW COMPARISON:  CT 02/27/2014.  Radiographs 02/21/2014. FINDINGS: There is stable cardiac enlargement status post CABG. The lungs appear unchanged with mild interstitial prominence and a calcified right upper lobe granuloma. There is no confluent airspace opacity, edema or pleural effusion. Flowing osteophytes are noted throughout the thoracic spine. Cholecystectomy clips noted. IMPRESSION: Stable chest.  No acute cardiopulmonary process. Electronically Signed   By: Richardean Sale M.D.   On: 12/15/2014 16:44   Ct Head Wo Contrast  12/15/2014  CLINICAL DATA:  Syncopal episode with collapse. Left-sided headache. Initial encounter. EXAM: CT HEAD WITHOUT CONTRAST TECHNIQUE: Contiguous axial images were obtained from the base of the skull through the vertex without intravenous contrast. COMPARISON:  None. FINDINGS: There is no evidence of acute intracranial hemorrhage, mass lesion, brain edema or extra-axial fluid collection. The ventricles and subarachnoid spaces are appropriately sized for age. There  is no CT evidence of acute cortical infarction. There is a small focus of encephalomalacia in the right occipital white matter (image number 13). Intracranial vascular calcifications are noted. The visualized paranasal sinuses, mastoid air cells and middle ears are clear. The calvarium is intact. IMPRESSION: No acute intracranial findings. Electronically Signed   By: Richardean Sale M.D.   On: 12/15/2014 16:35    EKG: Independently reviewed. sinus rhythm with first-degree AV block, chronic LBBB, trigeminy.  Assessment/Plan Principal Problem:   Syncope and collapse versus seizures Active Problems:   Coronary artery  disease   Hypercholesterolemia   History of hypertension   Gastroesophageal reflux disease   LBBB (left bundle branch block)   Aortic stenosis   Chronic chest pain   Syncope versus seizures, with collapse - Patient has had 3 episodes over the last 1 month, 2 of which were today.  - DD for syncope: Orthostatic hypotension (negative in ED), arrhythmias, vasovagal, worsened aortic stenosis versus other etiologies.  - Admit to telemetry for observation  - Monitor for seizures and seizure precautions. Check orthostatic blood pressure in a.m.  - Check 2-D echo, carotid Dopplers and EEG  - Neurology consulted. - Patient has been counseled that he should not drive for 6 months or until cleared by outpatient M.D.  Essential hypertension  - Mildly uncontrolled. Continue home medications and monitor closely.   Hyperlipidemia  - Continue statins   CAD, s/p CABG, s/p stent/cardiomyopathy - Continue aspirin, Plavix, statins, beta blockers and nitrates.  - Cycle troponins.  - update 2-D echo. 2-D echo 03/14/14: LV moderately dilated, septal dyssynergy from IVCD, LVEF possibly 35-45 percent, probable moderate aortic stenosis   Chronic chest pain  - Extensively worked up and felt to be gastric in origin. Responds to sublingual nitroglycerin. Status post cholecystectomy and has periodic  diarrhea.    GERD/Barrett's esophagus  - Continue PPI   Moderate aortic stenosis - as per echo in March 2016. Repeat echo to look for worsening.     DVT prophylaxis: Lovenox Code Status:  Full  Family Communication: Discussed extensively with patient's spouse and son at bedside.  Disposition Plan: DC home possibly 12/4 pending negative workup.    Time spent: 33 minutes   Jinger Middlesworth, MD, FACP, FHM. [facility] Pager [phone]  If 7PM-7AM, please contact night-coverage [email or web address] Password [email or web address] 12/15/2014, 7:08 PM

## 2014-12-15 NOTE — ED Notes (Signed)
Patient reports he still feels mild dizziness and just doesn't "feel well". Denies chest pain or neuro symptoms.

## 2014-12-15 NOTE — ED Notes (Signed)
Patient given turkey sandwich and diet coke.  

## 2014-12-15 NOTE — ED Notes (Signed)
Attempted report, RN to call back

## 2014-12-15 NOTE — ED Notes (Signed)
Patient updated on poc. Aware of possible admission. Patient reports that he has felt fluttering in chest for approx a month.

## 2014-12-15 NOTE — Telephone Encounter (Signed)
Patient's wife called weekend answering service. Patient has had diarrhea today. Earlier he passed out and fell on the floor. He has also had dizziness and nausea. They were wondering what he should do next. Given cardiac history including remote CABG the safest thing for him to do is to be evaluated immediately for his syncope. I recommended they proceed to ER now - I recommended they call 911 if he is presently feeling any residual symptoms. I told her regardless of what transport method they ultimately agree on, he absolutely should not drive himself. She verbalized understanding and gratitude. Dayna Dunn PA-C

## 2014-12-15 NOTE — Consult Note (Signed)
NEURO HOSPITALIST CONSULT NOTE   Referring physician: Dr Algis Liming Reason for Consult: syncope versus seizures  HPI:                                                                                                                                          [name] is an 69 y.o. male with a past medical history significant for HTN, HLD, CAD s/p CABG and stent deployment, moderate aortic stenosis, OSA, admitted to [facility] for further evaluation of possible syncope vs seizure. Patient was in his usual state of heath until today when he returned home from after spending couple of hours  And " passed out". He recalls standing up and bending forward to pick up his dog and almost immediately after resuming the erect position " became very dizzy and passed out for few seconds". As per chart review, his wife found him lying face forward but did not have abnormal body movements, bladder or bowel incontinence, or tongue biting. Stated that afterwards he was very sweaty and clammy and felt unwell for a short while. No associated chest pain, palpitations, or SOB. Later on, he  was sitting on his couch when his wife heard a snort and noted patient to be staring blankly in space and unresponsive.  He reportedly " had a similar episode approximately one month ago when both of them were looking for their dog and he was standing up and looking down and eventually laid down to get the dog which was underneath the couch. His wife was looking from the opposite side of the couch and suddenly noted patient make a snorting sound followed by blank stare which lasted about 30 seconds and was associated with urinary incontinence. No tongue biting. After today's episode, they called cardiologist's office who advised them to come to the ED for further evaluation" Presently, he denies HA, vertigo, double vision, focal weakness or numbness, slurred speech, language or vision impairment.  CT head was personally  reviewed and shows no acute intracranial findings.  Past Medical History  Diagnosis Date  . Coronary artery disease   . History of obesity   . Hypercholesterolemia   . History of hiatal hernia   . History of kidney stones   . History of erectile dysfunction   . Gastroesophageal reflux disease   . History of prostatitis   . Hypertension   . Kidney stones   . Heart murmur   . OSA (obstructive sleep apnea)     "suppose to wear mask; I don't" (03/13/2014)  . Aortic stenosis, moderate     Past Surgical History  Procedure Laterality Date  . Cholecystectomy N/A 01/23/2014    Procedure: LAPAROSCOPIC CHOLECYSTECTOMY WITH INTRAOPERATIVE CHOLANGIOGRAM ;  Surgeon: Fanny Skates, MD;  Location: [place];  Service: General;  Laterality: N/A;  . Coronary artery bypass graft  1996    "CABG X 7"  . Cardiac catheterization  1996; ~ 2006    ; Ejection fraction is estimated at 45%  . Coronary angioplasty with stent placement  03/13/2014    "1"  . Tonsillectomy  ~ 1950  . Colonoscopy w/ polypectomy    . Cystoscopy with stent placement    . Left heart catheterization with coronary/graft angiogram N/A 03/13/2014    Procedure: LEFT HEART CATHETERIZATION WITH Beatrix Fetters;  Surgeon: [name] [name], MD;  Location: [facility] CATH LAB;  Service: Cardiovascular;  Laterality: N/A;    Family History  Problem Relation Age of Onset  . Heart attack Father   . Cancer Sister     Uterine Cancer    Family History: no epilepsy, MS, brain tumor, or brain aneurysm   Social History:  reports that he quit smoking about 17 years ago. His smoking use included Cigarettes. He has a 11.25 pack-year smoking history. He has never used smokeless tobacco. He reports that he drinks about 2.4 oz of alcohol per week. He reports that he does not use illicit drugs.  No Known Allergies  MEDICATIONS:                                                                                                                      Scheduled: . [START ON 12/16/2014] aspirin EC  81 mg Oral Daily  . [START ON 12/16/2014] atorvastatin  80 mg Oral q1800  . [START ON 12/16/2014] clopidogrel  75 mg Oral Daily  . enoxaparin (LOVENOX) injection  40 mg Subcutaneous Q24H  . [START ON 12/16/2014] finasteride  5 mg Oral Daily  . [START ON 12/16/2014] isosorbide mononitrate  60 mg Oral Daily  . [START ON 12/16/2014] metoprolol succinate  50 mg Oral Daily  . [START ON 12/16/2014] oxybutynin  5 mg Oral Daily  . pantoprazole  40 mg Oral BID  . [START ON 12/16/2014] ramipril  5 mg Oral Daily  . sodium chloride  3 mL Intravenous Q12H     ROS:                                                                                                                                       History obtained from chart review and the patient  General ROS: negative for - chills, fatigue, fever, night sweats, or weight gain  Psychological ROS: negative for - behavioral disorder, hallucinations, memory difficulties, mood swings or suicidal ideation Ophthalmic ROS: negative for - blurry vision, double vision, eye pain or loss of vision ENT ROS: negative for - epistaxis, nasal discharge, oral lesions, sore throat, tinnitus or vertigo Allergy and Immunology ROS: negative for - hives or itchy/watery eyes Hematological and Lymphatic ROS: negative for - bleeding problems, bruising or swollen lymph nodes Endocrine ROS: negative for - galactorrhea, hair pattern changes, polydipsia/polyuria or temperature intolerance Respiratory ROS: negative for - cough, hemoptysis, shortness of breath or wheezing Cardiovascular ROS: negative for - chest pain, dyspnea on exertion, edema or irregular heartbeat Gastrointestinal ROS: negative for - abdominal pain, diarrhea, hematemesis, nausea/vomiting or stool incontinence Genito-Urinary ROS: negative for - dysuria, hematuria, incontinence or urinary frequency/urgency Musculoskeletal ROS: negative for - joint swelling or muscular  weakness Neurological ROS: as noted in HPI Dermatological ROS: negative for rash and skin lesion changes   Physical exam:  Constitutional: well developed, pleasant male in no apparent distress. Blood pressure 165/80, pulse 61, temperature 98.6 F (37 C), temperature source Oral, resp. rate 20, height _0  (1.778 m), weight 96.3 kg (212 lb 4.9 oz), SpO2 96 %. Eyes: no jaundice or exophthalmos.  Head: normocephalic. Neck: supple, no bruits, no JVD. Cardiac: no murmurs. Lungs: clear. Abdomen: soft, no tender, no mass. Extremities: no edema, clubbing, or cyanosis.  Skin: no rash  Neurologic Examination:                                                                                                      General: NAD Mental Status: Alert, oriented, thought content appropriate.  Speech fluent without evidence of aphasia.  Able to follow 3 step commands without difficulty. Cranial Nerves: II: Discs flat bilaterally; Visual fields grossly normal, pupils equal, round, reactive to light and accommodation III,IV, VI: ptosis not present, extra-ocular motions intact bilaterally V,VII: smile symmetric, facial light touch sensation normal bilaterally VIII: hearing normal bilaterally IX,X: uvula rises symmetrically XI: bilateral shoulder shrug XII: midline tongue extension without atrophy or fasciculations  Motor: Right : Upper extremity   5/5    Left:     Upper extremity   5/5  Lower extremity   5/5     Lower extremity   5/5 Tone and bulk:normal tone throughout; no atrophy noted Sensory: Pinprick and light touch intact throughout, bilaterally Deep Tendon Reflexes:  Right: Upper Extremity   Left: Upper extremity   biceps (C-5 to C-6) 2/4   biceps (C-5 to C-6) 2/4 tricep (C7) 2/4    triceps (C7) 2/4 Brachioradialis (C6) 2/4  Brachioradialis (C6) 2/4  Lower Extremity Lower Extremity  quadriceps (L-2 to L-4) 2/4   quadriceps (L-2 to L-4) 2/4 Achilles (S1) 2/4   Achilles (S1)  2/4  Plantars: Right: downgoing   Left: downgoing Cerebellar: normal finger-to-nose,  normal heel-to-shin test Gait:  No tested due to multiple leads         Lab Results  Component Value Date/Time   CHOL 146 08/03/2012 09:49 AM    Results for orders placed or performed during  the hospital encounter of 12/15/14 (from the past 48 hour(s))  Basic metabolic panel     Status: Abnormal   Collection Time: 12/15/14  3:20 PM  Result Value Ref Range   Sodium 140 135 - 145 mmol/L   Potassium 3.9 3.5 - 5.1 mmol/L   Chloride 107 101 - 111 mmol/L   CO2 25 22 - 32 mmol/L   Glucose, Bld 129 (H) 65 - 99 mg/dL   BUN 21 (H) 6 - 20 mg/dL   Creatinine, Ser 0.84 0.61 - 1.24 mg/dL   Calcium 9.3 8.9 - 10.3 mg/dL   GFR calc non Af Amer >60 >60 mL/min   GFR calc Af Amer >60 >60 mL/min    Comment: (NOTE) The eGFR has been calculated using the CKD EPI equation. This calculation has not been validated in all clinical situations. eGFR's persistently <60 mL/min signify possible Chronic Kidney Disease.    Anion gap 8 5 - 15  CBC     Status: None   Collection Time: 12/15/14  3:20 PM  Result Value Ref Range   WBC 9.2 4.0 - 10.5 K/uL   RBC 4.78 4.22 - 5.81 MIL/uL   Hemoglobin 14.9 13.0 - 17.0 g/dL   HCT 43.6 39.0 - 52.0 %   MCV 91.2 78.0 - 100.0 fL   MCH 31.2 26.0 - 34.0 pg   MCHC 34.2 30.0 - 36.0 g/dL   RDW 12.3 11.5 - 15.5 %   Platelets 189 150 - 400 K/uL  I-stat troponin, ED     Status: None   Collection Time: 12/15/14  3:57 PM  Result Value Ref Range   Troponin i, poc 0.00 0.00 - 0.08 ng/mL   Comment 3            Comment: Due to the release kinetics of cTnI, a negative result within the first hours of the onset of symptoms does not rule out myocardial infarction with certainty. If myocardial infarction is still suspected, repeat the test at appropriate intervals.   Urinalysis, Routine w reflex microscopic (not at [facility])     Status: None   Collection Time: 12/15/14  4:12 PM  Result  Value Ref Range   Color, Urine YELLOW YELLOW   APPearance CLEAR CLEAR   Specific Gravity, Urine 1.018 1.005 - 1.030   pH 7.0 5.0 - 8.0   Glucose, UA NEGATIVE NEGATIVE mg/dL   Hgb urine dipstick NEGATIVE NEGATIVE   Bilirubin Urine NEGATIVE NEGATIVE   Ketones, ur NEGATIVE NEGATIVE mg/dL   Protein, ur NEGATIVE NEGATIVE mg/dL   Nitrite NEGATIVE NEGATIVE   Leukocytes, UA NEGATIVE NEGATIVE    Comment: MICROSCOPIC NOT DONE ON URINES WITH NEGATIVE PROTEIN, BLOOD, LEUKOCYTES, NITRITE, OR GLUCOSE <1000 mg/dL.  Magnesium     Status: None   Collection Time: 12/15/14  9:36 PM  Result Value Ref Range   Magnesium 1.8 1.7 - 2.4 mg/dL  Troponin I     Status: None   Collection Time: 12/15/14  9:36 PM  Result Value Ref Range   Troponin I <0.03 <0.031 ng/mL    Comment:        NO INDICATION OF MYOCARDIAL INJURY.     Dg Chest 2 View  12/15/2014  CLINICAL DATA:  Seizure-like activity. Syncopal episode with head injury. EXAM: CHEST  2 VIEW COMPARISON:  CT 02/27/2014.  Radiographs 02/21/2014. FINDINGS: There is stable cardiac enlargement status post CABG. The lungs appear unchanged with mild interstitial prominence and a calcified right upper lobe granuloma. There is no confluent  airspace opacity, edema or pleural effusion. Flowing osteophytes are noted throughout the thoracic spine. Cholecystectomy clips noted. IMPRESSION: Stable chest.  No acute cardiopulmonary process. Electronically Signed   By: Richardean Sale M.D.   On: 12/15/2014 16:44   Ct Head Wo Contrast  12/15/2014  CLINICAL DATA:  Syncopal episode with collapse. Left-sided headache. Initial encounter. EXAM: CT HEAD WITHOUT CONTRAST TECHNIQUE: Contiguous axial images were obtained from the base of the skull through the vertex without intravenous contrast. COMPARISON:  None. FINDINGS: There is no evidence of acute intracranial hemorrhage, mass lesion, brain edema or extra-axial fluid collection. The ventricles and subarachnoid spaces are  appropriately sized for age. There is no CT evidence of acute cortical infarction. There is a small focus of encephalomalacia in the right occipital white matter (image number 13). Intracranial vascular calcifications are noted. The visualized paranasal sinuses, mastoid air cells and middle ears are clear. The calvarium is intact. IMPRESSION: No acute intracranial findings. Electronically Signed   By: Richardean Sale M.D.   On: 12/15/2014 16:35   Assessment/Plan: 69 y.o with strong cardiac history but without known risk factors for epilepsy, admitted after sustaining 2 transient episodes of loss of consciousness with the characteristics aforementioned. . Normal neuro-exam. CT brain without acute abnormality. Syncope versus seizure. EEG ordered. Will follow up after EEG.  Dorian Pod, MD 12/15/2014, 11:19 PM  [facility]

## 2014-12-15 NOTE — ED Provider Notes (Signed)
CSN: [id number]     Arrival date & time 12/15/14  1501 History   First MD Initiated Contact with Patient 12/15/14 1510     Chief Complaint  Patient presents with  . Fall  . Loss of Consciousness     (Consider location/radiation/quality/duration/timing/severity/associated sxs/prior Treatment) HPI JAGER LOCKETT is a 69 y.o. male with history of CAD and LHC 03/2014 with stent placed, comes in for evaluation of fall and LOC. Patient reports that approximately 11:30 AM this morning, he bent over to pick up his dog and when he stood back up, he became dizzy and lost consciousness for "less than a minute". He reports spontaneous return of consciousness. His wife reports approximately 1.5 hours later as he was sitting on the couch, she heard a loud snort and ran into the room to find patient "staring off into space and unresponsive". She reports this episode lasted only a few seconds and then patient was again responsive. He was amnestic to these events, but denies any confusion. There is no loss of bowel or bladder function, no tonic-clonic movements, no intraoral trauma. He denies any headache, vision changes, chest pain or shortness of breath, nausea or vomiting, diaphoresis, abdominal pain, numbness or weakness. Denies any discomfort now in the ED.  Past Medical History  Diagnosis Date  . Coronary artery disease   . History of obesity   . Hypercholesterolemia   . History of hiatal hernia   . History of kidney stones   . History of erectile dysfunction   . Gastroesophageal reflux disease   . History of prostatitis   . Hypertension   . Kidney stones   . Heart murmur   . OSA (obstructive sleep apnea)     "suppose to wear mask; I don't" (03/13/2014)  . Aortic stenosis, moderate    Past Surgical History  Procedure Laterality Date  . Cholecystectomy N/A 01/23/2014    Procedure: LAPAROSCOPIC CHOLECYSTECTOMY WITH INTRAOPERATIVE CHOLANGIOGRAM ;  Surgeon: Fanny Skates, MD;  Location: [place];   Service: General;  Laterality: N/A;  . Coronary artery bypass graft  1996    "CABG X 7"  . Cardiac catheterization  1996; ~ 2006    ; Ejection fraction is estimated at 45%  . Coronary angioplasty with stent placement  03/13/2014    "1"  . Tonsillectomy  ~ 1950  . Colonoscopy w/ polypectomy    . Cystoscopy with stent placement    . Left heart catheterization with coronary/graft angiogram N/A 03/13/2014    Procedure: LEFT HEART CATHETERIZATION WITH Beatrix Fetters;  Surgeon: [name] [name], MD;  Location: [facility] CATH LAB;  Service: Cardiovascular;  Laterality: N/A;   Family History  Problem Relation Age of Onset  . Heart attack Father   . Cancer Sister     Uterine Cancer   Social History  Substance Use Topics  . Smoking status: Former Smoker -- 0.75 packs/day for 15 years    Types: Cigarettes    Quit date: 01/12/1997  . Smokeless tobacco: Never Used     Comment: 3/4 pack per day. smoked 15 years off and on  . Alcohol Use: 2.4 oz/week    0 Standard drinks or equivalent, 4 Cans of beer per week    Review of Systems A 10 point review of systems was completed and was negative except for pertinent positives and negatives as mentioned in the history of present illness     Allergies  Review of patient's allergies indicates no known allergies.  Home Medications  Prior to Admission medications   Medication Sig Start Date End Date Taking? Authorizing Provider  Ascorbic Acid (VITAMIN C PO) Take 1,000 mg by mouth daily.    Yes Historical Provider, MD  aspirin EC 81 MG EC tablet Take 1 tablet (81 mg total) by mouth daily. 03/14/14  Yes Rhonda G Barrett, PA-C  atorvastatin (LIPITOR) 80 MG tablet TAKE 1 TABLET (80 MG TOTAL) BY MOUTH DAILY. 08/06/14  Yes [name] [name], MD  Cholecalciferol (VITAMIN D PO) Take 5,000 Units by mouth daily.    Yes Historical Provider, MD  clopidogrel (PLAVIX) 75 MG tablet Take 1 tablet (75 mg total) by mouth daily. 05/22/14  Yes [name] [name], MD  finasteride  (PROSCAR) 5 MG tablet Take 1 tablet (5 mg total) by mouth daily. 06/25/14  Yes [name] [name], MD  ibuprofen (ADVIL,MOTRIN) 200 MG tablet Take 600 mg by mouth every 6 (six) hours as needed (pain).   Yes Historical Provider, MD  isosorbide mononitrate (IMDUR) 60 MG 24 hr tablet Take 1 tablet (60 mg total) by mouth daily. 09/25/14  Yes [name] [name], MD  metoprolol succinate (TOPROL-XL) 50 MG 24 hr tablet Take 1 tablet (50 mg total) by mouth daily. Take with or immediately following a meal. 09/25/14  Yes [name] [name], MD  nitroGLYCERIN (NITROSTAT) 0.4 MG SL tablet PLACE 1 TABLET (0.4 MG TOTAL) UNDER THE TONGUE EVERY 5 (FIVE) MINUTES AS NEEDED FOR CHEST PAIN. 12/13/14  Yes [name] [name], MD  oxybutynin (DITROPAN) 5 MG tablet Take 5 mg by mouth daily.  07/04/12  Yes Historical Provider, MD  pantoprazole (PROTONIX) 40 MG tablet Take 1 tablet (40 mg total) by mouth 2 (two) times daily. 04/02/14  Yes Isaiah Serge, NP  ramipril (ALTACE) 5 MG capsule Take 1 capsule (5 mg total) by mouth daily. 04/02/14  Yes Isaiah Serge, NP  valACYclovir (VALTREX) 500 MG tablet Take 500 mg by mouth daily as needed (outbreak).  04/10/13  Yes Historical Provider, MD   BP 155/89 mmHg  Pulse 66  Temp(Src) 98.3 F (36.8 C) (Oral)  Resp 16  Ht 5\' 10"  (1.778 m)  SpO2 98% Physical Exam  Constitutional: He is oriented to person, place, and time. He appears well-developed and well-nourished.  HENT:  Head: Normocephalic and atraumatic.  Mouth/Throat: Oropharynx is clear and moist.  Very mild, left orbit ecchymosis. Extraocular movements intact without evidence of entrapment. No nystagmus. No other facial bone tenderness.  Eyes: Conjunctivae are normal. Pupils are equal, round, and reactive to light. Right eye exhibits no discharge. Left eye exhibits no discharge. No scleral icterus.  Neck: Neck supple.  Cardiovascular: Normal rate, regular rhythm and normal heart sounds.   Pulmonary/Chest: Effort normal and breath sounds  normal. No respiratory distress. He has no wheezes. He has no rales.  Abdominal: Soft. There is no tenderness.  Musculoskeletal: He exhibits no tenderness.  Neurological: He is alert and oriented to person, place, and time.  Cranial Nerves II-XII grossly intact  Skin: Skin is warm and dry. No rash noted.  Psychiatric: He has a normal mood and affect.  Nursing note and vitals reviewed.   ED Course  Procedures (including critical care time) Labs Review Labs Reviewed  BASIC METABOLIC PANEL - Abnormal; Notable for the following:    Glucose, Bld 129 (*)    BUN 21 (*)    All other components within normal limits  CBC  URINALYSIS, ROUTINE W REFLEX MICROSCOPIC (NOT AT [facility])  MAGNESIUM  CBG MONITORING,  ED  Randolm Idol, ED    Imaging Review Dg Chest 2 View  12/15/2014  CLINICAL DATA:  Seizure-like activity. Syncopal episode with head injury. EXAM: CHEST  2 VIEW COMPARISON:  CT 02/27/2014.  Radiographs 02/21/2014. FINDINGS: There is stable cardiac enlargement status post CABG. The lungs appear unchanged with mild interstitial prominence and a calcified right upper lobe granuloma. There is no confluent airspace opacity, edema or pleural effusion. Flowing osteophytes are noted throughout the thoracic spine. Cholecystectomy clips noted. IMPRESSION: Stable chest.  No acute cardiopulmonary process. Electronically Signed   By: Richardean Sale M.D.   On: 12/15/2014 16:44   Ct Head Wo Contrast  12/15/2014  CLINICAL DATA:  Syncopal episode with collapse. Left-sided headache. Initial encounter. EXAM: CT HEAD WITHOUT CONTRAST TECHNIQUE: Contiguous axial images were obtained from the base of the skull through the vertex without intravenous contrast. COMPARISON:  None. FINDINGS: There is no evidence of acute intracranial hemorrhage, mass lesion, brain edema or extra-axial fluid collection. The ventricles and subarachnoid spaces are appropriately sized for age. There is no CT evidence of acute cortical  infarction. There is a small focus of encephalomalacia in the right occipital white matter (image number 13). Intracranial vascular calcifications are noted. The visualized paranasal sinuses, mastoid air cells and middle ears are clear. The calvarium is intact. IMPRESSION: No acute intracranial findings. Electronically Signed   By: Richardean Sale M.D.   On: 12/15/2014 16:35   I have personally reviewed and evaluated these images and lab results as part of my medical decision-making.   EKG Interpretation   Date/Time:  Saturday December 15 2014 15:05:02 EST Ventricular Rate:  62 PR Interval:  221 QRS Duration: 178 QT Interval:  477 QTC Calculation: 484 R Axis:   -10 Text Interpretation:  Sinus rhythm Ventricular trigeminy Prolonged PR  interval Probable left atrial enlargement Left bundle branch block  Confirmed by DELO  MD, DOUGLAS ([id number]) on 12/15/2014 3:12:59 PM     Meds given in ED:  Medications - No data to display  New Prescriptions   No medications on file   Filed Vitals:   12/15/14 1600 12/15/14 1800 12/15/14 1807 12/15/14 1815  BP: 183/88 171/89 180/89 155/89  Pulse: 64 75 66   Temp:      TempSrc:      Resp: 15 18 16    Height:      SpO2: 97% 95% 98%     MDM  AWAIS MANZA is a 69 y.o. male with extensive cardiac history comes in for evaluation of loss of consciousness. Patient had one episode of bending over and then syncope with collapse upon standing. He reports another episode where he was "staring off into space and was unresponsive" and this episode lasted only a few seconds. Question seizure.  Denies any chest pain, shortness of breath prior to event. No intraoral trauma, evidence of postictal state, loss of bowel or bladder function. On arrival, he is hemodynamically stable. Physical exam is unremarkable. No orthostasis. ECG shows ventricular trigeminy. His troponin is negative, chest x-ray is clear. CT of his head shows no acute intracranial abnormalities.  Discussed with cardiology, Dr. Percival Spanish, about possibility of symptomatic arrhythmia and agrees it is reasonable for patient to be admitted to medical service overnight for observation. Patient is amenable to this plan and agrees to follow up with cardiology upon discharge. Discussed with Dr. Algis Liming , will see in the ED. Patient admitted. Prior to admission, I discussed and reviewed this case with my attending, Dr. Stark Jock  who also saw and evaluated the patient and agrees with plan for medical admission.   Final diagnoses:  Syncope and collapse versus seizures        Comer Locket, PA-C 12/15/14 [place], MD 12/16/14 0900

## 2014-12-15 NOTE — ED Notes (Signed)
To room via EMS.  Onset around 11:30 pt bent over, stood up fell and had LOC for several minutes.  Then pt was sitting on cough, starting to lean over and had brief LOC.  Pt c/o dizziness at this time, reports "just doesn't feel good".  Pt had episode like this 1 or 2 months ago.  Bruising noted to side of left face.  EMS gave ASA 243 mg and pt took 81mg  at home.  EKG shows LBBB, 4-5 runs of V Tach.

## 2014-12-16 ENCOUNTER — Observation Stay (HOSPITAL_COMMUNITY): Payer: Medicare Other

## 2014-12-16 ENCOUNTER — Observation Stay (HOSPITAL_BASED_OUTPATIENT_CLINIC_OR_DEPARTMENT_OTHER): Payer: Medicare Other

## 2014-12-16 ENCOUNTER — Encounter (HOSPITAL_COMMUNITY): Payer: Self-pay | Admitting: Physician Assistant

## 2014-12-16 DIAGNOSIS — I5022 Chronic systolic (congestive) heart failure: Secondary | ICD-10-CM

## 2014-12-16 DIAGNOSIS — R079 Chest pain, unspecified: Secondary | ICD-10-CM | POA: Diagnosis not present

## 2014-12-16 DIAGNOSIS — R55 Syncope and collapse: Secondary | ICD-10-CM | POA: Diagnosis not present

## 2014-12-16 DIAGNOSIS — I447 Left bundle-branch block, unspecified: Secondary | ICD-10-CM | POA: Diagnosis not present

## 2014-12-16 DIAGNOSIS — I493 Ventricular premature depolarization: Secondary | ICD-10-CM | POA: Diagnosis present

## 2014-12-16 DIAGNOSIS — Z8679 Personal history of other diseases of the circulatory system: Secondary | ICD-10-CM

## 2014-12-16 DIAGNOSIS — R0989 Other specified symptoms and signs involving the circulatory and respiratory systems: Secondary | ICD-10-CM

## 2014-12-16 DIAGNOSIS — I25708 Atherosclerosis of coronary artery bypass graft(s), unspecified, with other forms of angina pectoris: Secondary | ICD-10-CM | POA: Diagnosis not present

## 2014-12-16 DIAGNOSIS — I350 Nonrheumatic aortic (valve) stenosis: Secondary | ICD-10-CM

## 2014-12-16 LAB — TSH: TSH: 1.572 u[IU]/mL (ref 0.350–4.500)

## 2014-12-16 LAB — TROPONIN I
Troponin I: 0.07 ng/mL — ABNORMAL HIGH (ref <0.031)
Troponin I: <0.03 ng/mL (ref <0.031)

## 2014-12-16 MED ORDER — SODIUM CHLORIDE 0.9 % IV SOLN
INTRAVENOUS | Status: DC
  Administered 2014-12-17: 07:00:00 via INTRAVENOUS

## 2014-12-16 MED ORDER — SODIUM CHLORIDE 0.9 % IR SOLN
80.0000 mg | Status: AC
  Administered 2014-12-17: 80 mg
  Filled 2014-12-16: qty 2

## 2014-12-16 MED ORDER — CEFAZOLIN SODIUM-DEXTROSE 2-3 GM-% IV SOLR
2.0000 g | INTRAVENOUS | Status: AC
  Administered 2014-12-17: 2 g via INTRAVENOUS
  Filled 2014-12-16: qty 50

## 2014-12-16 MED ORDER — CHLORHEXIDINE GLUCONATE 4 % EX LIQD
60.0000 mL | Freq: Once | CUTANEOUS | Status: AC
  Administered 2014-12-16: 4 via TOPICAL
  Filled 2014-12-16: qty 30

## 2014-12-16 MED ORDER — MAGNESIUM OXIDE 400 (241.3 MG) MG PO TABS
400.0000 mg | ORAL_TABLET | Freq: Every day | ORAL | Status: DC
  Administered 2014-12-16 – 2014-12-18 (×3): 400 mg via ORAL
  Filled 2014-12-16 (×3): qty 1

## 2014-12-16 MED ORDER — PERFLUTREN LIPID MICROSPHERE
1.0000 mL | INTRAVENOUS | Status: AC | PRN
  Administered 2014-12-16: 2 mL via INTRAVENOUS
  Filled 2014-12-16: qty 10

## 2014-12-16 MED ORDER — CHLORHEXIDINE GLUCONATE 4 % EX LIQD
60.0000 mL | Freq: Once | CUTANEOUS | Status: AC
  Administered 2014-12-17: 4 via TOPICAL
  Filled 2014-12-16: qty 15

## 2014-12-16 MED ORDER — SODIUM CHLORIDE 0.45 % IV SOLN
INTRAVENOUS | Status: DC
  Administered 2014-12-17: 07:00:00 via INTRAVENOUS

## 2014-12-16 NOTE — Progress Notes (Signed)
[facility] PROGRESS NOTE  [name] [id number] DOB: [birth year] DOA: 12/15/2014 PCP: Peter [name], MD  Assessment/Plan: 1. Syncope versus seizure-no further episodes of syncope in the hospital, second troponin 0.07, will consult cardiology for further workup for syncope. Neurology is following. Will follow the EEG results. 2. Essential hypertension- blood pressure is well controlled, continue Toprol-XL, ramipril. 3. Hyperlipidemia- continue statins 4. CAD status post CABG, status post stent placement- stable, continue aspirin, Plavix, statins, beta blockers, nitrates. Will follow 2-D echo 5. Chronic chest pain- patient has chronic right-sided chest pain, worse at night and relieved by sublingual nitroglycerin glycerin. Follow serial troponin. Cardiology consultation 6. Moderate aortic stenosis- as per echo in March 2016 patient had moderate aortic stenosis, will follow repeat echocardiogram. 7. DVT prophylaxis- Lovenox  Code Status: Full code Family Communication: *No family present at bedside Disposition Plan: Pending workup for syncope   Consultants:  Cardiology  Procedures:  Echocardiogram  Antibiotics:  None  HPI/Subjective: 69 y.o. male , married, independent of activities of daily living, PMH of CAD status post CABG in 1996, Cardiac cath March 2016 showed high-grade disease in SVG to OM and despite successful PCI with DES has chronic chest pain-now suspected to be of GI origin and relieved by sublingual NTG, moderate aortic stenosis, HLD, obesity, Barrett's esophagus, LBBB, GERD, HTN, OSA noncompliant with CPAP, presented to [facility] ED on 12/15/14 with episodes of passing out. Patient and spouse at bedside provided history. Patient seen by neurology for possible seizure. EEG has been ordered.  This morning patient denies any chest pain or shortness of breath. Telemetry shows multiple PVCs and NSVT. 2-D echocardiogram is pending   Objective: Filed Vitals:   12/15/14 2100 12/16/14 0416  BP: 165/80 166/87  Pulse: 61 68  Temp: 98.6 F (37 C) 98.7 F (37.1 C)  Resp: 20 18    Intake/Output Summary (Last 24 hours) at 12/16/14 1030 Last data filed at 12/16/14 [id number]  Gross per 24 hour  Intake    720 ml  Output   1225 ml  Net   -505 ml   Filed Weights   12/15/14 2100  Weight: 96.3 kg (212 lb 4.9 oz)    Exam:   General:  appears in no acute distress  CarS1-S2 regular, grade 3/6 holosystolic murmur noted at the aortic area  Respiratory: clear to auscultation bilaterally  Abdomen: soft, nontender, no organomegaly  Musculoskeletal: no cyanosis/clubbing/edema. Extremities   Data Reviewed: Basic Metabolic Panel:  Recent Labs Lab 12/15/14 1520 12/15/14 2136  NA 140  --   K 3.9  --   CL 107  --   CO2 25  --   GLUCOSE 129*  --   BUN 21*  --   CREATININE 0.84  --   CALCIUM 9.3  --   MG  --  1.8   CBC:  Recent Labs Lab 12/15/14 1520  WBC 9.2  HGB 14.9  HCT 43.6  MCV 91.2  PLT 189   Cardiac Enzymes:  Recent Labs Lab 12/15/14 2136 12/16/14 0327  TROPONINI <0.03 0.07*    Studies: Dg Chest 2 View  12/15/2014  CLINICAL DATA:  Seizure-like activity. Syncopal episode with head injury. EXAM: CHEST  2 VIEW COMPARISON:  CT 02/27/2014.  Radiographs 02/21/2014. FINDINGS: There is stable cardiac enlargement status post CABG. The lungs appear unchanged with mild interstitial prominence and a calcified right upper lobe granuloma. There is no confluent airspace opacity, edema or pleural effusion. Flowing osteophytes are noted throughout the thoracic spine. Cholecystectomy clips noted.  IMPRESSION: Stable chest.  No acute cardiopulmonary process. Electronically Signed   By: Richardean Sale M.D.   On: 12/15/2014 16:44   Ct Head Wo Contrast  12/15/2014  CLINICAL DATA:  Syncopal episode with collapse. Left-sided headache. Initial encounter. EXAM: CT HEAD WITHOUT CONTRAST TECHNIQUE: Contiguous axial images were obtained from the base of  the skull through the vertex without intravenous contrast. COMPARISON:  None. FINDINGS: There is no evidence of acute intracranial hemorrhage, mass lesion, brain edema or extra-axial fluid collection. The ventricles and subarachnoid spaces are appropriately sized for age. There is no CT evidence of acute cortical infarction. There is a small focus of encephalomalacia in the right occipital white matter (image number 13). Intracranial vascular calcifications are noted. The visualized paranasal sinuses, mastoid air cells and middle ears are clear. The calvarium is intact. IMPRESSION: No acute intracranial findings. Electronically Signed   By: Richardean Sale M.D.   On: 12/15/2014 16:35    Scheduled Meds: . aspirin EC  81 mg Oral Daily  . atorvastatin  80 mg Oral q1800  . clopidogrel  75 mg Oral Daily  . enoxaparin (LOVENOX) injection  40 mg Subcutaneous Q24H  . finasteride  5 mg Oral Daily  . isosorbide mononitrate  60 mg Oral Daily  . metoprolol succinate  50 mg Oral Daily  . oxybutynin  5 mg Oral Daily  . pantoprazole  40 mg Oral BID  . ramipril  5 mg Oral Daily  . sodium chloride  3 mL Intravenous Q12H   Continuous Infusions:   Principal Problem:   Syncope and collapse versus seizures Active Problems:   Coronary artery disease   Hypercholesterolemia   History of hypertension   Gastroesophageal reflux disease   LBBB (left bundle branch block)   Aortic stenosis   Chronic chest pain   PVC's (premature ventricular contractions)    Time spent: 25 min    [facility] Pager [phone]. If 7PM-7AM, please contact night-coverage at [email or web address], password [facility] 12/16/2014, 10:30 AM

## 2014-12-16 NOTE — Progress Notes (Signed)
*  PRELIMINARY RESULTS* Echocardiogram 2D Echocardiogram with definity has been performed.  Leavy Cella 12/16/2014, 3:23 PM

## 2014-12-16 NOTE — Progress Notes (Signed)
*  PRELIMINARY RESULTS* Vascular Ultrasound Carotid Duplex (Doppler) has been completed.  Findings suggest 1-39% internal carotid artery stenosis bilaterally. Vertebral arteries are patent with antegrade flow.  12/16/2014 10:29 AM Maudry Mayhew, RVT, RDCS, RDMS

## 2014-12-16 NOTE — Consult Note (Signed)
Cardiology Consultation Note  Patient ID: [name], MRN: [id number], DOB/AGE: 71-Aug-1947 71 y.o. Admit date: 12/15/2014   Date of Consult: 12/16/2014 Primary Cardiologist: Dr. [name]  Chief Complaint: passed out Reason for Consultation: recurrent syncope, abnormal troponin  HPI: [name] is a 69 y/o M with history of CAD (s/p CABG 1996, s/p DES to SVG-OM2 in 03/2014), Barrett's esophagus, obesity, HLD, GERD, OSA, aortic stenosis, LBBB, PVCs on prior EKG who presented to [facility] with syncope. Per chart, his CABG included a LIMA graft to the LAD and diagonal, sequential vein graft to the acute marginal, PDA, and posterior lateral branches of the right coronary, sequential saphenous vein graft to the obtuse marginal vessel and distal circumflex. He had a cardiac catheterization in 2007 which showed that his grafts were all patent. On 04/02/14 he had repeat cath for chest pain and grafts were again patent but there was a 90% stenosis in the SVG to OM2 which was stented with DES. Despite intervention he continued to have chest pain felt GI in nature. He had cholecystectomy in January without complications and without relief of his pain. CXR was unremarkable. CT of the chest was negative for PE or aortic pathology. He had EGD by Dr. Oletta Lamas that showed Barrett's esophagus but no evidence of esophagitis - has been no Protonix since that time. The pain has persisted, usually at night, no relationship to foods or exertion. Last 2D echo 03/2014: mod dilated LV, decreased motion of inferolateral wall and anterolateral wall, EF possibly 35-45%, high vent filling pressure, probably moderate AS with mean gradient 19 and peak 36, mod-severely dilated LA, mildly dilated RA.  He presents for evaluation of several episodes of loss of consciousness. 1) 6 weeks ago - Episode 1: He and his wife were lying on the ground on either side of the bed trying to coax their dog out from underneath. Suddenly the  patient developed a glazed look over his eyes, started snorting, and fell over onto his face. She ran over and shook him and after several seconds he answered "What?!!?" He did have bladder incontinence. Then went on with his day as usual. He felt somewhat tired later on but otherwise felt fine. 2) Yesterday - Episode 2: He went to the gym as usual yesterday AM and felt fine with exertion. He had not eaten or drank anything by lunchtime which was not unusual for him. His wife was in the other room when she heard a thud like furniture crashing. She went into the other room and found the patient lying face down on the floor, unresponsive. She shook him and yelled and after several seconds he came to. He remembers feeling dizzy before the event but doesn't remember any other details. He reassured her he was fine and declined to seek medical care at that time. He ate some crackers and drank some soda. He then went to the bathroom and had diarrhea. He took a shower and then settled into the couch to rest where his wife could keep an eye on him. 3) Yesterday - Episode 3:  His wife noticed that he began to fall over to one side while sitting on the couch. He again began making the snorting noise similar to episode 1 along with glazed look. She yelled and shook him and after several seconds he came to. His wife called the answering service and I advised they proceed to ER.  Only the first episode was associated with incontinence. He had no shaking  or lip smacking. He denies any antecedent palpitations, chest pain or dyspnea. EKG shows known LBBB with PVCs similar to 03/2014. CT head nonacute, CXR stable. Labwork notable for neg troponin x 2 then next set 0.07. K 3.9, Mg 1.8, CBC WNL, BMET ok except glucose 129. Telemetry shows NSR with occasional PVCs, brief trigeminy, rare couplets and one run 4 beat NSVT.   Past Medical History  Diagnosis Date  . Coronary artery disease     a. CABG 1996: LIMA graft to the LAD and  diagonal, sequential vein graft to the acute marginal, PDA, and posterior lateral branches of the right coronary, sequential saphenous vein graft to the obtuse marginal vessel and distal circumflex. b. Cath 2007 patent grafts. c. s/p DES to SVG-OM2 in 03/2014.  Marland Kitchen History of obesity   . Hypercholesterolemia   . History of hiatal hernia   . History of kidney stones   . History of erectile dysfunction   . Gastroesophageal reflux disease   . History of prostatitis   . Hypertension   . Kidney stones   . OSA (obstructive sleep apnea)     "suppose to wear mask; I don't" (03/13/2014)  . Aortic stenosis, moderate   . Barrett's esophageal ulceration   . LBBB (left bundle branch block)       Surgical History:  Past Surgical History  Procedure Laterality Date  . Cholecystectomy N/A 01/23/2014    Procedure: LAPAROSCOPIC CHOLECYSTECTOMY WITH INTRAOPERATIVE CHOLANGIOGRAM ;  Surgeon: Fanny Skates, MD;  Location: [place];  Service: General;  Laterality: N/A;  . Coronary artery bypass graft  1996    "CABG X 7"  . Cardiac catheterization  1996; ~ 2006    ; Ejection fraction is estimated at 45%  . Coronary angioplasty with stent placement  03/13/2014    "1"  . Tonsillectomy  ~ 1950  . Colonoscopy w/ polypectomy    . Cystoscopy with stent placement    . Left heart catheterization with coronary/graft angiogram N/A 03/13/2014    Procedure: LEFT HEART CATHETERIZATION WITH Beatrix Fetters;  Surgeon: [name] [name], MD;  Location: [facility] CATH LAB;  Service: Cardiovascular;  Laterality: N/A;     Home Meds: Prior to Admission medications   Medication Sig Start Date End Date Taking? Authorizing Provider  Ascorbic Acid (VITAMIN C PO) Take 1,000 mg by mouth daily.    Yes Historical Provider, MD  aspirin EC 81 MG EC tablet Take 1 tablet (81 mg total) by mouth daily. 03/14/14  Yes Rhonda G Barrett, PA-C  atorvastatin (LIPITOR) 80 MG tablet TAKE 1 TABLET (80 MG TOTAL) BY MOUTH DAILY. 08/06/14  Yes [name] [name], MD   Cholecalciferol (VITAMIN D PO) Take 5,000 Units by mouth daily.    Yes Historical Provider, MD  clopidogrel (PLAVIX) 75 MG tablet Take 1 tablet (75 mg total) by mouth daily. 05/22/14  Yes [name] [name], MD  finasteride (PROSCAR) 5 MG tablet Take 1 tablet (5 mg total) by mouth daily. 06/25/14  Yes [name] [name], MD  ibuprofen (ADVIL,MOTRIN) 200 MG tablet Take 600 mg by mouth every 6 (six) hours as needed (pain).   Yes Historical Provider, MD  isosorbide mononitrate (IMDUR) 60 MG 24 hr tablet Take 1 tablet (60 mg total) by mouth daily. 09/25/14  Yes [name] [name], MD  metoprolol succinate (TOPROL-XL) 50 MG 24 hr tablet Take 1 tablet (50 mg total) by mouth daily. Take with or immediately following a meal. 09/25/14  Yes [name] [name], MD  nitroGLYCERIN (  NITROSTAT) 0.4 MG SL tablet PLACE 1 TABLET (0.4 MG TOTAL) UNDER THE TONGUE EVERY 5 (FIVE) MINUTES AS NEEDED FOR CHEST PAIN. 12/13/14  Yes [name] [name], MD  oxybutynin (DITROPAN) 5 MG tablet Take 5 mg by mouth daily.  07/04/12  Yes Historical Provider, MD  pantoprazole (PROTONIX) 40 MG tablet Take 1 tablet (40 mg total) by mouth 2 (two) times daily. 04/02/14  Yes Isaiah Serge, NP  ramipril (ALTACE) 5 MG capsule Take 1 capsule (5 mg total) by mouth daily. 04/02/14  Yes Isaiah Serge, NP  valACYclovir (VALTREX) 500 MG tablet Take 500 mg by mouth daily as needed (outbreak).  04/10/13  Yes Historical Provider, MD    Inpatient Medications:  . aspirin EC  81 mg Oral Daily  . atorvastatin  80 mg Oral q1800  . clopidogrel  75 mg Oral Daily  . enoxaparin (LOVENOX) injection  40 mg Subcutaneous Q24H  . finasteride  5 mg Oral Daily  . isosorbide mononitrate  60 mg Oral Daily  . metoprolol succinate  50 mg Oral Daily  . oxybutynin  5 mg Oral Daily  . pantoprazole  40 mg Oral BID  . ramipril  5 mg Oral Daily  . sodium chloride  3 mL Intravenous Q12H      Allergies: No Known Allergies  Social History   Social History  . Marital Status: Married     Spouse Name: N/A  . Number of Children: 3  . Years of Education: N/A   Occupational History  . sales     retired   Social History Main Topics  . Smoking status: Former Smoker -- 0.75 packs/day for 15 years    Types: Cigarettes    Quit date: 01/12/1997  . Smokeless tobacco: Never Used     Comment: 3/4 pack per day. smoked 15 years off and on  . Alcohol Use: 2.4 oz/week    0 Standard drinks or equivalent, 4 Cans of beer per week  . Drug Use: No  . Sexual Activity: Yes   Other Topics Concern  . Not on file   Social History Narrative     Family History  Problem Relation Age of Onset  . Heart attack Father   . Cancer Sister     Uterine Cancer     Review of Systems: All other systems reviewed and are otherwise negative except as noted above.  Labs:  Recent Labs  12/15/14 2136 12/16/14 0327  TROPONINI <0.03 0.07*   Lab Results  Component Value Date   WBC 9.2 12/15/2014   HGB 14.9 12/15/2014   HCT 43.6 12/15/2014   MCV 91.2 12/15/2014   PLT 189 12/15/2014    Recent Labs Lab 12/15/14 1520  NA 140  K 3.9  CL 107  CO2 25  BUN 21*  CREATININE 0.84  CALCIUM 9.3  GLUCOSE 129*   Lab Results  Component Value Date   CHOL 146 08/03/2012   HDL 39.20 08/03/2012   LDLCALC 86 08/03/2012   TRIG 106.0 08/03/2012   Lab Results  Component Value Date   DDIMER 0.86* 02/21/2014    Radiology/Studies:  Dg Chest 2 View  12/15/2014  CLINICAL DATA:  Seizure-like activity. Syncopal episode with head injury. EXAM: CHEST  2 VIEW COMPARISON:  CT 02/27/2014.  Radiographs 02/21/2014. FINDINGS: There is stable cardiac enlargement status post CABG. The lungs appear unchanged with mild interstitial prominence and a calcified right upper lobe granuloma. There is no confluent airspace opacity, edema or pleural effusion. Flowing  osteophytes are noted throughout the thoracic spine. Cholecystectomy clips noted. IMPRESSION: Stable chest.  No acute cardiopulmonary process. Electronically  Signed   By: Richardean Sale M.D.   On: 12/15/2014 16:44   Ct Head Wo Contrast  12/15/2014  CLINICAL DATA:  Syncopal episode with collapse. Left-sided headache. Initial encounter. EXAM: CT HEAD WITHOUT CONTRAST TECHNIQUE: Contiguous axial images were obtained from the base of the skull through the vertex without intravenous contrast. COMPARISON:  None. FINDINGS: There is no evidence of acute intracranial hemorrhage, mass lesion, brain edema or extra-axial fluid collection. The ventricles and subarachnoid spaces are appropriately sized for age. There is no CT evidence of acute cortical infarction. There is a small focus of encephalomalacia in the right occipital white matter (image number 13). Intracranial vascular calcifications are noted. The visualized paranasal sinuses, mastoid air cells and middle ears are clear. The calvarium is intact. IMPRESSION: No acute intracranial findings. Electronically Signed   By: Richardean Sale M.D.   On: 12/15/2014 16:35    Wt Readings from Last 3 Encounters:  12/15/14 212 lb 4.9 oz (96.3 kg)  09/25/14 210 lb 1.6 oz (95.301 kg)  05/22/14 208 lb 11.2 oz (94.666 kg)    EKG:  1)NSR 62 ventricular trigeminy LBBB 2) NSR 67bpm 1st degree AVB occasional PVCs LBBB   Physical Exam: Blood pressure 166/87, pulse 68, temperature 98.7 F (37.1 C), temperature source Oral, resp. rate 18, height 5\' 10"  (1.778 m), weight 212 lb 4.9 oz (96.3 kg), SpO2 98 %. General: Well developed, well nourished WM, in no acute distress. Head: Normocephalic, atraumatic, sclera non-icteric, no xanthomas, nares are without discharge.  Neck: Bilateral carotid bruits. JVD not elevated. Lungs: Clear bilaterally to auscultation without wheezes, rales, or rhonchi. Breathing is unlabored. Heart: RRR with 2/6 SEM RUSB with diminished S2. No rubs or gallops appreciated. Abdomen: Soft, non-tender, non-distended with normoactive bowel sounds. No hepatomegaly. No rebound/guarding. No obvious abdominal  masses. Msk:  Strength and tone appear normal for age. Extremities: No clubbing or cyanosis. No edema.  Distal pedal pulses are 2+ and equal bilaterally. Neuro: Alert and oriented X 3. No facial asymmetry. No focal deficit. Moves all extremities spontaneously. Psych:  Responds to questions appropriately with a normal affect.    Assessment and Plan:   1. Recurrent loss of consciousness - episodes concerning for arrhythmogenic syncope (either bradyarrhythmia or ventricular arrhythmia). 2D echo pending to re-evaluate LVEF. See below regarding EP recommendations from Dr. Rayann Heman. Although he could have progression of AS, he has not had any exertional symptoms with exercise including exertional angina or syncope. EEG also pending.  2. CAD s/p CABG, PCI - minimally elevated troponin of unclear significance. F/u level pending. Continue ASA, Plavix, BB, statin. Await echo.  3. Chronic systolic CHF - appears euvolemic.  4. Moderate AS - see above.  5. Carotid bruits, abdominal bruit - bruits may be r/t AS but carotid duplex pending. Will also order [place] of aorta (although doubt relationship to #1).  6. Essential HTN - BP elevated since admission. Will review with MD. May be prudent to follow for now after med administration in case further arrhythmias present themselves.  7. PVCs/NSVT - start MagOx 400mg  daily for Mg 1.8. Add TSH. Follow on telemetry.  Signed, Charlie Pitter PA-C 12/16/2014, 9:38 AM Pager: [phone]  I have seen, examined the patient, and reviewed the above assessment and plan.on exam, RRR.  Late peaking AS murmur.  Changes to above are made where necessary.    The patient has  an ischemic CM (EF 35%), NYHA Class II/III CHF, LBBB, and syncope.  His history is suggestive of an arrhythmic source of syncope. I find this to be very worrisome for either VT or AV block.  Symptomatic AS is also possibly though his syncope was not exertional and thus this is less likely.  At this time, he meets  MADIT II/ SCD-HeFT criteria for ICD implantation for treatment of syncope and prevention of sudden death.  Risks, benefits, alternatives to BIV ICD implantation were discussed in detail with the patient today. The patient  understands that the risks include but are not limited to bleeding, infection, pneumothorax, perforation, tamponade, vascular damage, renal failure, MI, stroke, death, inappropriate shocks, and lead dislodgement and wishes to proceed.   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  He does not use it anymore and is not concerned.  Echo is pending.  Anticipate probable BIV ICD with Dr Lovena Le tomorrow with medical management of AS in the short term and possible TAVR vs redo sternotomy in the future.   NO driving x 6 months (pt and wife aware).  Dr Lovena Le to see in am.  Co Sign: Thompson Grayer, MD 12/16/2014 11:47 AM

## 2014-12-17 ENCOUNTER — Observation Stay (HOSPITAL_COMMUNITY): Payer: Medicare Other

## 2014-12-17 ENCOUNTER — Encounter (HOSPITAL_COMMUNITY)
Admission: EM | Disposition: A | Discharge status: Home / Self Care | Payer: Self-pay | Source: Home / Self Care | Attending: Family Medicine

## 2014-12-17 DIAGNOSIS — R079 Chest pain, unspecified: Secondary | ICD-10-CM | POA: Diagnosis not present

## 2014-12-17 DIAGNOSIS — Z7982 Long term (current) use of aspirin: Secondary | ICD-10-CM | POA: Diagnosis not present

## 2014-12-17 DIAGNOSIS — I472 Ventricular tachycardia: Secondary | ICD-10-CM | POA: Diagnosis present

## 2014-12-17 DIAGNOSIS — Z9119 Patient's noncompliance with other medical treatment and regimen: Secondary | ICD-10-CM | POA: Diagnosis not present

## 2014-12-17 DIAGNOSIS — I255 Ischemic cardiomyopathy: Secondary | ICD-10-CM | POA: Diagnosis not present

## 2014-12-17 DIAGNOSIS — G4733 Obstructive sleep apnea (adult) (pediatric): Secondary | ICD-10-CM | POA: Diagnosis present

## 2014-12-17 DIAGNOSIS — R55 Syncope and collapse: Secondary | ICD-10-CM | POA: Diagnosis present

## 2014-12-17 DIAGNOSIS — E785 Hyperlipidemia, unspecified: Secondary | ICD-10-CM | POA: Diagnosis present

## 2014-12-17 DIAGNOSIS — E669 Obesity, unspecified: Secondary | ICD-10-CM | POA: Diagnosis present

## 2014-12-17 DIAGNOSIS — K2270 Barrett's esophagus without dysplasia: Secondary | ICD-10-CM | POA: Diagnosis present

## 2014-12-17 DIAGNOSIS — I447 Left bundle-branch block, unspecified: Secondary | ICD-10-CM | POA: Diagnosis present

## 2014-12-17 DIAGNOSIS — I25119 Atherosclerotic heart disease of native coronary artery with unspecified angina pectoris: Secondary | ICD-10-CM | POA: Diagnosis present

## 2014-12-17 DIAGNOSIS — R0989 Other specified symptoms and signs involving the circulatory and respiratory systems: Secondary | ICD-10-CM | POA: Diagnosis present

## 2014-12-17 DIAGNOSIS — Z955 Presence of coronary angioplasty implant and graft: Secondary | ICD-10-CM | POA: Diagnosis not present

## 2014-12-17 DIAGNOSIS — R001 Bradycardia, unspecified: Secondary | ICD-10-CM | POA: Diagnosis present

## 2014-12-17 DIAGNOSIS — I10 Essential (primary) hypertension: Secondary | ICD-10-CM | POA: Diagnosis present

## 2014-12-17 DIAGNOSIS — Z87891 Personal history of nicotine dependence: Secondary | ICD-10-CM | POA: Diagnosis not present

## 2014-12-17 DIAGNOSIS — Z6830 Body mass index (BMI) 30.0-30.9, adult: Secondary | ICD-10-CM | POA: Diagnosis not present

## 2014-12-17 DIAGNOSIS — Z7902 Long term (current) use of antithrombotics/antiplatelets: Secondary | ICD-10-CM | POA: Diagnosis not present

## 2014-12-17 DIAGNOSIS — Z951 Presence of aortocoronary bypass graft: Secondary | ICD-10-CM | POA: Diagnosis not present

## 2014-12-17 DIAGNOSIS — I350 Nonrheumatic aortic (valve) stenosis: Secondary | ICD-10-CM | POA: Diagnosis present

## 2014-12-17 DIAGNOSIS — K219 Gastro-esophageal reflux disease without esophagitis: Secondary | ICD-10-CM | POA: Diagnosis present

## 2014-12-17 DIAGNOSIS — I5022 Chronic systolic (congestive) heart failure: Secondary | ICD-10-CM | POA: Diagnosis not present

## 2014-12-17 HISTORY — PX: EP IMPLANTABLE DEVICE: SHX172B

## 2014-12-17 LAB — BASIC METABOLIC PANEL
ANION GAP: 8 (ref 5–15)
BUN: 17 mg/dL (ref 6–20)
CALCIUM: 8.9 mg/dL (ref 8.9–10.3)
CO2: 30 mmol/L (ref 22–32)
CREATININE: 0.83 mg/dL (ref 0.61–1.24)
Chloride: 103 mmol/L (ref 101–111)
Glucose, Bld: 99 mg/dL (ref 65–99)
Potassium: 3.8 mmol/L (ref 3.5–5.1)
SODIUM: 141 mmol/L (ref 135–145)

## 2014-12-17 LAB — MAGNESIUM: Magnesium: 2 mg/dL (ref 1.7–2.4)

## 2014-12-17 SURGERY — BIV ICD INSERTION CRT-D
Anesthesia: LOCAL

## 2014-12-17 MED ORDER — FENTANYL CITRATE (PF) 100 MCG/2ML IJ SOLN
INTRAMUSCULAR | Status: AC
  Filled 2014-12-17: qty 2

## 2014-12-17 MED ORDER — MIDAZOLAM HCL 5 MG/5ML IJ SOLN
INTRAMUSCULAR | Status: DC | PRN
  Administered 2014-12-17 (×6): 1 mg via INTRAVENOUS

## 2014-12-17 MED ORDER — ACETAMINOPHEN 325 MG PO TABS
325.0000 mg | ORAL_TABLET | ORAL | Status: DC | PRN
  Administered 2014-12-17 – 2014-12-18 (×2): 650 mg via ORAL
  Filled 2014-12-17 (×2): qty 2

## 2014-12-17 MED ORDER — LIDOCAINE HCL (PF) 1 % IJ SOLN
INTRAMUSCULAR | Status: DC | PRN
  Administered 2014-12-17: 50 mL

## 2014-12-17 MED ORDER — LIDOCAINE HCL (PF) 1 % IJ SOLN
INTRAMUSCULAR | Status: AC
  Filled 2014-12-17: qty 30

## 2014-12-17 MED ORDER — CHLORHEXIDINE GLUCONATE 4 % EX LIQD
CUTANEOUS | Status: AC
  Administered 2014-12-17: 07:00:00
  Filled 2014-12-17: qty 15

## 2014-12-17 MED ORDER — HEPARIN (PORCINE) IN NACL 2-0.9 UNIT/ML-% IJ SOLN
INTRAMUSCULAR | Status: DC | PRN
  Administered 2014-12-17: 16:00:00

## 2014-12-17 MED ORDER — ONDANSETRON HCL 4 MG/2ML IJ SOLN: 4.0000 mg | Freq: Four times a day (QID) | INTRAMUSCULAR | Status: DC | PRN

## 2014-12-17 MED ORDER — GENTAMICIN SULFATE 40 MG/ML IJ SOLN
INTRAMUSCULAR | Status: AC
  Filled 2014-12-17: qty 2

## 2014-12-17 MED ORDER — HEPARIN (PORCINE) IN NACL 2-0.9 UNIT/ML-% IJ SOLN
INTRAMUSCULAR | Status: AC
  Filled 2014-12-17: qty 500

## 2014-12-17 MED ORDER — MIDAZOLAM HCL 5 MG/5ML IJ SOLN
INTRAMUSCULAR | Status: AC
  Filled 2014-12-17: qty 5

## 2014-12-17 MED ORDER — IOHEXOL 350 MG/ML SOLN
INTRAVENOUS | Status: DC | PRN
  Administered 2014-12-17 (×2): 15 mL via INTRACARDIAC

## 2014-12-17 MED ORDER — CEFAZOLIN SODIUM 1-5 GM-% IV SOLN
1.0000 g | Freq: Four times a day (QID) | INTRAVENOUS | Status: AC
  Administered 2014-12-17 – 2014-12-18 (×3): 1 g via INTRAVENOUS
  Filled 2014-12-17 (×4): qty 50

## 2014-12-17 MED ORDER — FENTANYL CITRATE (PF) 100 MCG/2ML IJ SOLN
INTRAMUSCULAR | Status: DC | PRN
  Administered 2014-12-17 (×6): 12.5 ug via INTRAVENOUS

## 2014-12-17 SURGICAL SUPPLY — 17 items
ADAPTER SEALING SSA-EW-09 (MISCELLANEOUS) ×2 IMPLANT
ADPR INTRO LNG 9FR SL XTD WNG (MISCELLANEOUS) ×1
CABLE SURGICAL S-101-97-12 (CABLE) ×2 IMPLANT
CATH ATTAIN LDS 6216A-MB2 (CATHETERS) ×2 IMPLANT
CATH HEX JOS 2-5-2 65CM 6F REP (CATHETERS) ×2 IMPLANT
ICD VIVA QUAD XT CRT-D DTBA1Q1 (ICD Generator) ×2 IMPLANT
LEAD ATTAIN PERFORMA S 4598-88 (Lead) ×2 IMPLANT
LEAD CAPSURE NOVUS 5076-52CM (Lead) ×2 IMPLANT
LEAD SPRINT QUAT SEC 6935-65CM (Lead) ×2 IMPLANT
PAD DEFIB LIFELINK (PAD) ×2 IMPLANT
SET INTRODUCER MICROPUNCT 5F (INTRODUCER) ×2 IMPLANT
SHEATH CLASSIC 7F (SHEATH) ×4 IMPLANT
SHEATH CLASSIC 9.5F (SHEATH) ×2 IMPLANT
SHEATH CLASSIC 9F (SHEATH) ×2 IMPLANT
SLITTER 6232ADJ (MISCELLANEOUS) ×2 IMPLANT
TRAY PACEMAKER INSERTION (PACKS) ×2 IMPLANT
WIRE MAILMAN 182CM (WIRE) ×2 IMPLANT

## 2014-12-17 NOTE — Discharge Instructions (Signed)
° ° °  Supplemental Discharge Instructions for  Pacemaker/Defibrillator Patients  Activity No heavy lifting or vigorous activity with your left/right arm for 6 to 8 weeks.  Do not raise your left/right arm above your head for one week.  Gradually raise your affected arm as drawn below.           12/22/14                   12/23/14                  12/24/14                12/25/14 __  NO DRIVING for 6 months   WOUND CARE - Keep the wound area clean and dry.  Do not get this area wet for one week. No showers for one week; you may shower on    12/25/14 . - The tape/steri-strips on your wound will fall off; do not pull them off.  No bandage is needed on the site.  DO  NOT apply any creams, oils, or ointments to the wound area. - If you notice any drainage or discharge from the wound, any swelling or bruising at the site, or you develop a fever > 101? F after you are discharged home, call the office at once.  Special Instructions - You are still able to use cellular telephones; use the ear opposite the side where you have your pacemaker/defibrillator.  Avoid carrying your cellular phone near your device. - When traveling through airports, show security personnel your identification card to avoid being screened in the metal detectors.  Ask the security personnel to use the hand wand. - Avoid arc welding equipment, MRI testing (magnetic resonance imaging), TENS units (transcutaneous nerve stimulators).  Call the office for questions about other devices. - Avoid electrical appliances that are in poor condition or are not properly grounded. - Microwave ovens are safe to be near or to operate.  Additional information for defibrillator patients should your device go off: - If your device goes off ONCE and you feel fine afterward, notify the device clinic nurses. - If your device goes off ONCE and you do not feel well afterward, call 911. - If your device goes off TWICE, call 911. - If your device  goes off THREE times in one day, call 911.  DO NOT DRIVE YOURSELF OR A FAMILY MEMBER WITH A DEFIBRILLATOR TO THE HOSPITAL--CALL 911.

## 2014-12-17 NOTE — Care Management Obs Status (Signed)
MEDICARE OBSERVATION STATUS NOTIFICATION   Patient Details  Name: [name] MRN: [id number] Date of Birth: 1945/12/25   Medicare Observation Status Notification Given:  Yes    Maryclare Labrador, RN 12/17/2014, 10:58 AM

## 2014-12-17 NOTE — H&P (Signed)
  ICD Criteria  Current LVEF:30%. Within 12 months prior to implant: Yes   Heart failure history: Yes, Class II  Cardiomyopathy history: Yes, Ischemic Cardiomyopathy.  Atrial Fibrillation/Atrial Flutter: No.  Ventricular tachycardia history: No.  Cardiac arrest history: No.  History of syndromes with risk of sudden death: No.  Previous ICD: No.  Current ICD indication: Primary  PPM indication: Yes. Pacing type: Ventricular. Greater than 40% RV pacing requirement anticipated. Indication: Atrial lead imiplant for SVT discrimination   Class I or II Bradycardia indication present: No  Beta Blocker therapy for 3 or more months: Yes, prescribed.   Ace Inhibitor/ARB therapy for 3 or more months: Yes, prescribed.

## 2014-12-17 NOTE — Procedures (Signed)
ELECTROENCEPHALOGRAM REPORT  Patient: [name]       Room #: [id number] EEG No. ID: [id number] Age: 69 y.o.        Sex: male Referring Physician: Georgiann Mohs Report Date:  12/17/2014        Interpreting Physician: Anthony Sar  History: [name] is an 70 y.o. male with a history of hypertension, hyperlipidemia, coronary artery disease and obstructive sleep apnea, admitted following an episode of transient loss of consciousness. Patient also gives a history of recurrent episodes of blank stare with reduced responsiveness.  Indications for study:  Rule out seizure disorder.  Technique: This is an 18 channel routine scalp EEG performed at the bedside with bipolar and monopolar montages arranged in accordance to the international 10/20 system of electrode placement.   Description: This EEG recording was performed during wakefulness and during sleep. Predominant activity during wakefulness consisted of 10-11  Hz alpha rhythm with good attenuation with eye opening. Low amplitude fast beta activity was also noted in the frontal and central regions. Photic stimulation produced symmetrical occipital driving response.  Hyperventilation was not performed. There was symmetrical slowing of background activity with mixed irregular delta and theta activity diffusely during sleep. Symmetrical vertex waves, sleep spindles and K-complexes are recorded during stage II of sleep. No epileptiform discharges were recorded. There was no abnormal slowing of cerebral activity.  Interpretation: This is a normal EEG recording during wakefulness and during sleep. No evidence of an epileptic disorder was demonstrated. However, a normal EEG recording in and of itself does not rule out seizure disorder.   Rush Farmer M.D. [facility] [phone]

## 2014-12-17 NOTE — Progress Notes (Signed)
12/17/2014 1800 Received pt back from cath lab after pacemaker/ICD placement.  Pt is A&O, no c/o voiced.  Tele monitor applied and CCMD notified.  Oriented to room, call light and bed.  Call bell in reach. [place]

## 2014-12-17 NOTE — Progress Notes (Signed)
SUBJECTIVE: The patient is doing well today.  At this time, he denies chest pain, shortness of breath, or any new concerns.  Marland Kitchen aspirin EC  81 mg Oral Daily  . atorvastatin  80 mg Oral q1800  .  ceFAZolin (ANCEF) IV  2 g Intravenous To Cath  . clopidogrel  75 mg Oral Daily  . finasteride  5 mg Oral Daily  . gentamicin irrigation  80 mg Irrigation To Cath  . isosorbide mononitrate  60 mg Oral Daily  . magnesium oxide  400 mg Oral Daily  . metoprolol succinate  50 mg Oral Daily  . oxybutynin  5 mg Oral Daily  . pantoprazole  40 mg Oral BID  . ramipril  5 mg Oral Daily  . sodium chloride  3 mL Intravenous Q12H   . sodium chloride    . sodium chloride 10 mL/hr at 12/17/14 0700    OBJECTIVE: Physical Exam: Filed Vitals:   12/16/14 0416 12/16/14 1512 12/16/14 1946 12/17/14 0436  BP: 166/87 117/68 155/71 152/81  Pulse: 68 66 57 55  Temp: 98.7 F (37.1 C) 97.8 F (36.6 C) 98.4 F (36.9 C) 97.9 F (36.6 C)  TempSrc: Oral Oral Oral Oral  Resp: 18 18 20 16   Height:      Weight:      SpO2: 98% 98% 97% 95%    Intake/Output Summary (Last 24 hours) at 12/17/14 0829 Last data filed at 12/16/14 1753  Gross per 24 hour  Intake    486 ml  Output      0 ml  Net    486 ml    Telemetry reveals SR, PVC's, occ couplets  GEN- The patient is well appearing, alert and oriented x 3 today.   Head- normocephalic, atraumatic Eyes-  Sclera clear, conjunctiva pink Ears- hearing intact Oropharynx- clear Neck- supple, no JVP, ?b/l carotid bruits (?referred from AS) Lungs- Clear to ausculation bilaterally, normal work of breathing Heart- Regular rate and rhythm, 2/6 systolic murmur, no rubs or gallops GI- soft, NT, ND Extremities- no clubbing, cyanosis, trace edema Skin- no rash or lesion Psych- euthymic mood, full affect Neuro- no gross deficits appreciated  LABS: Basic Metabolic Panel:  Recent Labs  12/15/14 1520 12/15/14 2136 12/17/14 0219  NA 140  --  141  K 3.9  --  3.8    CL 107  --  103  CO2 25  --  30  GLUCOSE 129*  --  99  BUN 21*  --  17  CREATININE 0.84  --  0.83  CALCIUM 9.3  --  8.9  MG  --  1.8 2.0   CBC:  Recent Labs  12/15/14 1520  WBC 9.2  HGB 14.9  HCT 43.6  MCV 91.2  PLT 189   Cardiac Enzymes:  Recent Labs  12/15/14 2136 12/16/14 0327 12/16/14 0956  TROPONINI <0.03 0.07* <0.03   Thyroid Function Tests:  Recent Labs  12/16/14 1148  TSH 1.572    RADIOLOGY: Dg Chest 2 View 12/15/2014  CLINICAL DATA:  Seizure-like activity. Syncopal episode with head injury. EXAM: CHEST  2 VIEW COMPARISON:  CT 02/27/2014.  Radiographs 02/21/2014. FINDINGS: There is stable cardiac enlargement status post CABG. The lungs appear unchanged with mild interstitial prominence and a calcified right upper lobe granuloma. There is no confluent airspace opacity, edema or pleural effusion. Flowing osteophytes are noted throughout the thoracic spine. Cholecystectomy clips noted. IMPRESSION: Stable chest.  No acute cardiopulmonary process. Electronically Signed   By: Gwyndolyn Saxon  Lin Landsman M.D.   On: 12/15/2014 16:44   Ct Head Wo Contrast 12/15/2014  CLINICAL DATA:  Syncopal episode with collapse. Left-sided headache. Initial encounter. EXAM: CT HEAD WITHOUT CONTRAST TECHNIQUE: Contiguous axial images were obtained from the base of the skull through the vertex without intravenous contrast. COMPARISON:  None. FINDINGS: There is no evidence of acute intracranial hemorrhage, mass lesion, brain edema or extra-axial fluid collection. The ventricles and subarachnoid spaces are appropriately sized for age. There is no CT evidence of acute cortical infarction. There is a small focus of encephalomalacia in the right occipital white matter (image number 13). Intracranial vascular calcifications are noted. The visualized paranasal sinuses, mastoid air cells and middle ears are clear. The calvarium is intact. IMPRESSION: No acute intracranial findings. Electronically Signed   By:  Richardean Sale M.D.   On: 12/15/2014 16:35   US Aorta 12/16/2014  CLINICAL DATA:  Abdominal bruit. EXAM: ULTRASOUND OF ABDOMINAL AORTA TECHNIQUE: Ultrasound examination of the abdominal aorta was performed to evaluate for abdominal aortic aneurysm. COMPARISON:  None. FINDINGS: Abdominal Aorta There is mild fusiform dilation of the distal abdominal aorta. Maximum Diameter: Proximal abdominal aorta measures 1.7 x 1.6 cm. Mid abdominal aorta measures 1.7 x 1.6 cm. Distal abdominal aorta measures 2.0 x 2.1 cm. Right common iliac artery measures 1.0 x 1.2 cm. Left common iliac artery measures 0.9 x 1.1 cm. IMPRESSION: Mild fusiform dilation of the distal abdominal aorta, with maximum diameter of 2.1 cm. Electronically Signed   By: Fidela Salisbury M.D.   On: 12/16/2014 13:52   12/16/14: Echocardiogram: Study Conclusions - Left ventricle: The cavity size was mildly dilated. There was severe concentric hypertrophy. Systolic function was moderately to severely reduced. The estimated ejection fraction was in the range of 30% to 35%. There is akinesis of the apicalanterolateral and apical myocardium. Doppler parameters are consistent with abnormal left ventricular relaxation (grade 1 diastolic dysfunction). - Ventricular septum: Septal motion showed abnormal function and dyssynergy. - Aortic valve: Cusp separation was reduced. There was moderate to severe stenosis. Stenosis severity may be underestimated by reduced EF. Peak velocity (S): 366 cm/s. Mean gradient (S): 24 mm Hg. Valve area (VTI): 0.94 cm^2. Valve area (Vmax): 0.84 cm^2. Valve area (Vmean): 0.92 cm^2. - Mitral valve: There was mild regurgitation. - Left atrium: The atrium was severely dilated. Impressions: - When compared to prior, EF remains reduced and aortic stenosis has advanced.  12/16/14: Carotid US is pending  ASSESSMENT AND PLAN:  Principal Problem:   Syncope and collapse versus seizures Active  Problems:   Coronary artery disease   Hypercholesterolemia   History of hypertension   Gastroesophageal reflux disease   LBBB (left bundle branch block)   Aortic stenosis   Chronic chest pain   PVC's (premature ventricular contractions)   Chronic systolic CHF (congestive heart failure) (HCC)   Bilateral carotid bruits   Abdominal bruit  1. Syncope     ICM, with EF 30-35%, and NSVT, frequent VPC's on telemetry (chronically on BB/ACE, March 2016 EF 35-40%)     LBBB     Mod-severe AS on his echo this admit     Carotid bruits: [place] pending reading     Abd bruit without significant findings AO by [place] Planned for BiV ICD implant today with Dr. Lovena Le, Dr. Rayann Heman discussed POC with the patient's primary cardiologist, Dr. [name], agrees with plan.     NO driving [id number] (patient has been instructed)  2. CAD      Remote CABG, most recently  Cath with PCI to SVG-OM2 with DES on 04/02/14      no ACS (<0.03, 0.07, <0.03)      On ASA, Plavix, BB, statin, nitrate  3. HTN     Follow with current  Tommye Standard, PA-C 12/17/2014 8:29 AM   EP Attending  Patient seen and examined. Agree with the findings as noted above. With syncope occuring without exertion,LBBB, and LV dysfunction, his risk for sudden death is significant. We will proceed with Biv ICD implant. He may have worsening of his aortic valve gradient after biV and require TAVR sooner.   Mikle Bosworth.D.

## 2014-12-17 NOTE — Progress Notes (Signed)
[facility] PROGRESS NOTE  [name] [id number] DOB: November 08, 1945 DOA: 12/15/2014 PCP: Peter [name], MD  Assessment/Plan: 1. Syncope no further episodes of syncope in the hospital, second troponin 0.07, cardiology consulted and at this time patient is going for biventricular ICD placement implant today. 2. ? Seizure- patient also had EEG done, neurology is following. Final report is pending 3. Essential hypertension- blood pressure is well controlled, continue Toprol-XL, ramipril. 4. Hyperlipidemia- continue statins 5. CAD status post CABG, status post stent placement- stable, continue aspirin, Plavix, statins, beta blockers, nitrates. 2-D echo shows EF 30-35%. 6. Chronic chest pain- patient has chronic right-sided chest pain, worse at night and relieved by sublingual nitroglycerin glycerin. Follow-up troponin is normal 7. Moderate aortic stenosis- as per echo done yesterday patient has moderate to severe aortic stenosis, patient will need possible T aVR in near future as per cardiology. 8. DVT prophylaxis- Lovenox  Code Status: Full code Family Communication: *No family present at bedside Disposition Plan: Pending workup for syncope   Consultants:  Cardiology  Procedures:  Echocardiogram  Antibiotics:  None  HPI/Subjective: 69 y.o. male , married, independent of activities of daily living, PMH of CAD status post CABG in 1996, Cardiac cath March 2016 showed high-grade disease in SVG to OM and despite successful PCI with DES has chronic chest pain-now suspected to be of GI origin and relieved by sublingual NTG, moderate aortic stenosis, HLD, obesity, Barrett's esophagus, LBBB, GERD, HTN, OSA noncompliant with CPAP, presented to [facility] ED on 12/15/14 with episodes of passing out. Patient and spouse at bedside provided history. Patient seen by neurology for possible seizure. EEG has been ordered.  Patient denies any symptoms this morning. No chest pain or shortness of  breath. Was seen by cardiology yesterday and plan for Biv ICD placement today.   Objective: Filed Vitals:   12/17/14 0436 12/17/14 0935  BP: 152/81 169/97  Pulse: 55 64  Temp: 97.9 F (36.6 C)   Resp: 16     Intake/Output Summary (Last 24 hours) at 12/17/14 1040 Last data filed at 12/16/14 1753  Gross per 24 hour  Intake    486 ml  Output      0 ml  Net    486 ml   Filed Weights   12/15/14 2100  Weight: 96.3 kg (212 lb 4.9 oz)    Exam:   General:  appears in no acute distress  CarS1-S2 regular, grade 3/6 holosystolic murmur noted at the aortic area  Respiratory: clear to auscultation bilaterally  Abdomen: soft, nontender, no organomegaly  Musculoskeletal: no cyanosis/clubbing/edema. Extremities   Data Reviewed: Basic Metabolic Panel:  Recent Labs Lab 12/15/14 1520 12/15/14 2136 12/17/14 0219  NA 140  --  141  K 3.9  --  3.8  CL 107  --  103  CO2 25  --  30  GLUCOSE 129*  --  99  BUN 21*  --  17  CREATININE 0.84  --  0.83  CALCIUM 9.3  --  8.9  MG  --  1.8 2.0   CBC:  Recent Labs Lab 12/15/14 1520  WBC 9.2  HGB 14.9  HCT 43.6  MCV 91.2  PLT 189   Cardiac Enzymes:  Recent Labs Lab 12/15/14 2136 12/16/14 0327 12/16/14 0956  TROPONINI <0.03 0.07* <0.03    Studies: Dg Chest 2 View  12/15/2014  CLINICAL DATA:  Seizure-like activity. Syncopal episode with head injury. EXAM: CHEST  2 VIEW COMPARISON:  CT 02/27/2014.  Radiographs 02/21/2014. FINDINGS: There is  stable cardiac enlargement status post CABG. The lungs appear unchanged with mild interstitial prominence and a calcified right upper lobe granuloma. There is no confluent airspace opacity, edema or pleural effusion. Flowing osteophytes are noted throughout the thoracic spine. Cholecystectomy clips noted. IMPRESSION: Stable chest.  No acute cardiopulmonary process. Electronically Signed   By: Richardean Sale M.D.   On: 12/15/2014 16:44   Ct Head Wo Contrast  12/15/2014  CLINICAL DATA:   Syncopal episode with collapse. Left-sided headache. Initial encounter. EXAM: CT HEAD WITHOUT CONTRAST TECHNIQUE: Contiguous axial images were obtained from the base of the skull through the vertex without intravenous contrast. COMPARISON:  None. FINDINGS: There is no evidence of acute intracranial hemorrhage, mass lesion, brain edema or extra-axial fluid collection. The ventricles and subarachnoid spaces are appropriately sized for age. There is no CT evidence of acute cortical infarction. There is a small focus of encephalomalacia in the right occipital white matter (image number 13). Intracranial vascular calcifications are noted. The visualized paranasal sinuses, mastoid air cells and middle ears are clear. The calvarium is intact. IMPRESSION: No acute intracranial findings. Electronically Signed   By: Richardean Sale M.D.   On: 12/15/2014 16:35   US Aorta  12/16/2014  CLINICAL DATA:  Abdominal bruit. EXAM: ULTRASOUND OF ABDOMINAL AORTA TECHNIQUE: Ultrasound examination of the abdominal aorta was performed to evaluate for abdominal aortic aneurysm. COMPARISON:  None. FINDINGS: Abdominal Aorta There is mild fusiform dilation of the distal abdominal aorta. Maximum Diameter: Proximal abdominal aorta measures 1.7 x 1.6 cm. Mid abdominal aorta measures 1.7 x 1.6 cm. Distal abdominal aorta measures 2.0 x 2.1 cm. Right common iliac artery measures 1.0 x 1.2 cm. Left common iliac artery measures 0.9 x 1.1 cm. IMPRESSION: Mild fusiform dilation of the distal abdominal aorta, with maximum diameter of 2.1 cm. Electronically Signed   By: Fidela Salisbury M.D.   On: 12/16/2014 13:52    Scheduled Meds: . aspirin EC  81 mg Oral Daily  . atorvastatin  80 mg Oral q1800  .  ceFAZolin (ANCEF) IV  2 g Intravenous To Cath  . clopidogrel  75 mg Oral Daily  . finasteride  5 mg Oral Daily  . gentamicin irrigation  80 mg Irrigation To Cath  . isosorbide mononitrate  60 mg Oral Daily  . magnesium oxide  400 mg Oral Daily   . metoprolol succinate  50 mg Oral Daily  . oxybutynin  5 mg Oral Daily  . pantoprazole  40 mg Oral BID  . ramipril  5 mg Oral Daily  . sodium chloride  3 mL Intravenous Q12H   Continuous Infusions: . sodium chloride 10 mL/hr at 12/17/14 0700  . sodium chloride 10 mL/hr at 12/17/14 0700    Principal Problem:   Syncope and collapse versus seizures Active Problems:   Coronary artery disease   Hypercholesterolemia   History of hypertension   Gastroesophageal reflux disease   LBBB (left bundle branch block)   Aortic stenosis   Chronic chest pain   PVC's (premature ventricular contractions)   Chronic systolic CHF (congestive heart failure) (HCC)   Bilateral carotid bruits   Abdominal bruit    Time spent: 25 min    [facility] Pager [phone]. If 7PM-7AM, please contact night-coverage at [email or web address], password [facility] 12/17/2014, 10:40 AM

## 2014-12-17 NOTE — H&P (View-Only) (Signed)
SUBJECTIVE: The patient is doing well today.  At this time, he denies chest pain, shortness of breath, or any new concerns.  Marland Kitchen aspirin EC  81 mg Oral Daily  . atorvastatin  80 mg Oral q1800  .  ceFAZolin (ANCEF) IV  2 g Intravenous To Cath  . clopidogrel  75 mg Oral Daily  . finasteride  5 mg Oral Daily  . gentamicin irrigation  80 mg Irrigation To Cath  . isosorbide mononitrate  60 mg Oral Daily  . magnesium oxide  400 mg Oral Daily  . metoprolol succinate  50 mg Oral Daily  . oxybutynin  5 mg Oral Daily  . pantoprazole  40 mg Oral BID  . ramipril  5 mg Oral Daily  . sodium chloride  3 mL Intravenous Q12H   . sodium chloride    . sodium chloride 10 mL/hr at 12/17/14 0700    OBJECTIVE: Physical Exam: Filed Vitals:   12/16/14 0416 12/16/14 1512 12/16/14 1946 12/17/14 0436  BP: 166/87 117/68 155/71 152/81  Pulse: 68 66 57 55  Temp: 98.7 F (37.1 C) 97.8 F (36.6 C) 98.4 F (36.9 C) 97.9 F (36.6 C)  TempSrc: Oral Oral Oral Oral  Resp: 18 18 20 16   Height:      Weight:      SpO2: 98% 98% 97% 95%    Intake/Output Summary (Last 24 hours) at 12/17/14 0829 Last data filed at 12/16/14 1753  Gross per 24 hour  Intake    486 ml  Output      0 ml  Net    486 ml    Telemetry reveals SR, PVC's, occ couplets  GEN- The patient is well appearing, alert and oriented x 3 today.   Head- normocephalic, atraumatic Eyes-  Sclera clear, conjunctiva pink Ears- hearing intact Oropharynx- clear Neck- supple, no JVP, ?b/l carotid bruits (?referred from AS) Lungs- Clear to ausculation bilaterally, normal work of breathing Heart- Regular rate and rhythm, 2/6 systolic murmur, no rubs or gallops GI- soft, NT, ND Extremities- no clubbing, cyanosis, trace edema Skin- no rash or lesion Psych- euthymic mood, full affect Neuro- no gross deficits appreciated  LABS: Basic Metabolic Panel:  Recent Labs  12/15/14 1520 12/15/14 2136 12/17/14 0219  NA 140  --  141  K 3.9  --  3.8    CL 107  --  103  CO2 25  --  30  GLUCOSE 129*  --  99  BUN 21*  --  17  CREATININE 0.84  --  0.83  CALCIUM 9.3  --  8.9  MG  --  1.8 2.0   CBC:  Recent Labs  12/15/14 1520  WBC 9.2  HGB 14.9  HCT 43.6  MCV 91.2  PLT 189   Cardiac Enzymes:  Recent Labs  12/15/14 2136 12/16/14 0327 12/16/14 0956  TROPONINI <0.03 0.07* <0.03   Thyroid Function Tests:  Recent Labs  12/16/14 1148  TSH 1.572    RADIOLOGY: Dg Chest 2 View 12/15/2014  CLINICAL DATA:  Seizure-like activity. Syncopal episode with head injury. EXAM: CHEST  2 VIEW COMPARISON:  CT 02/27/2014.  Radiographs 02/21/2014. FINDINGS: There is stable cardiac enlargement status post CABG. The lungs appear unchanged with mild interstitial prominence and a calcified right upper lobe granuloma. There is no confluent airspace opacity, edema or pleural effusion. Flowing osteophytes are noted throughout the thoracic spine. Cholecystectomy clips noted. IMPRESSION: Stable chest.  No acute cardiopulmonary process. Electronically Signed   By: Gwyndolyn Saxon  Lin Landsman M.D.   On: 12/15/2014 16:44   Ct Head Wo Contrast 12/15/2014  CLINICAL DATA:  Syncopal episode with collapse. Left-sided headache. Initial encounter. EXAM: CT HEAD WITHOUT CONTRAST TECHNIQUE: Contiguous axial images were obtained from the base of the skull through the vertex without intravenous contrast. COMPARISON:  None. FINDINGS: There is no evidence of acute intracranial hemorrhage, mass lesion, brain edema or extra-axial fluid collection. The ventricles and subarachnoid spaces are appropriately sized for age. There is no CT evidence of acute cortical infarction. There is a small focus of encephalomalacia in the right occipital white matter (image number 13). Intracranial vascular calcifications are noted. The visualized paranasal sinuses, mastoid air cells and middle ears are clear. The calvarium is intact. IMPRESSION: No acute intracranial findings. Electronically Signed   By:  Richardean Sale M.D.   On: 12/15/2014 16:35   US Aorta 12/16/2014  CLINICAL DATA:  Abdominal bruit. EXAM: ULTRASOUND OF ABDOMINAL AORTA TECHNIQUE: Ultrasound examination of the abdominal aorta was performed to evaluate for abdominal aortic aneurysm. COMPARISON:  None. FINDINGS: Abdominal Aorta There is mild fusiform dilation of the distal abdominal aorta. Maximum Diameter: Proximal abdominal aorta measures 1.7 x 1.6 cm. Mid abdominal aorta measures 1.7 x 1.6 cm. Distal abdominal aorta measures 2.0 x 2.1 cm. Right common iliac artery measures 1.0 x 1.2 cm. Left common iliac artery measures 0.9 x 1.1 cm. IMPRESSION: Mild fusiform dilation of the distal abdominal aorta, with maximum diameter of 2.1 cm. Electronically Signed   By: Fidela Salisbury M.D.   On: 12/16/2014 13:52   12/16/14: Echocardiogram: Study Conclusions - Left ventricle: The cavity size was mildly dilated. There was severe concentric hypertrophy. Systolic function was moderately to severely reduced. The estimated ejection fraction was in the range of 30% to 35%. There is akinesis of the apicalanterolateral and apical myocardium. Doppler parameters are consistent with abnormal left ventricular relaxation (grade 1 diastolic dysfunction). - Ventricular septum: Septal motion showed abnormal function and dyssynergy. - Aortic valve: Cusp separation was reduced. There was moderate to severe stenosis. Stenosis severity may be underestimated by reduced EF. Peak velocity (S): 366 cm/s. Mean gradient (S): 24 mm Hg. Valve area (VTI): 0.94 cm^2. Valve area (Vmax): 0.84 cm^2. Valve area (Vmean): 0.92 cm^2. - Mitral valve: There was mild regurgitation. - Left atrium: The atrium was severely dilated. Impressions: - When compared to prior, EF remains reduced and aortic stenosis has advanced.  12/16/14: Carotid US is pending  ASSESSMENT AND PLAN:  Principal Problem:   Syncope and collapse versus seizures Active  Problems:   Coronary artery disease   Hypercholesterolemia   History of hypertension   Gastroesophageal reflux disease   LBBB (left bundle branch block)   Aortic stenosis   Chronic chest pain   PVC's (premature ventricular contractions)   Chronic systolic CHF (congestive heart failure) (HCC)   Bilateral carotid bruits   Abdominal bruit  1. Syncope     ICM, with EF 30-35%, and NSVT, frequent VPC's on telemetry (chronically on BB/ACE, March 2016 EF 35-40%)     LBBB     Mod-severe AS on his echo this admit     Carotid bruits: [place] pending reading     Abd bruit without significant findings AO by [place] Planned for BiV ICD implant today with Dr. Lovena Le, Dr. Rayann Heman discussed POC with the patient's primary cardiologist, Dr. [name], agrees with plan.     NO driving [id number] (patient has been instructed)  2. CAD      Remote CABG, most recently  Cath with PCI to SVG-OM2 with DES on 04/02/14      no ACS (<0.03, 0.07, <0.03)      On ASA, Plavix, BB, statin, nitrate  3. HTN     Follow with current  Tommye Standard, PA-C 12/17/2014 8:29 AM   EP Attending  Patient seen and examined. Agree with the findings as noted above. With syncope occuring without exertion,LBBB, and LV dysfunction, his risk for sudden death is significant. We will proceed with Biv ICD implant. He may have worsening of his aortic valve gradient after biV and require TAVR sooner.   Mikle Bosworth.D.

## 2014-12-17 NOTE — Progress Notes (Signed)
EEG Completed; Results Pending  

## 2014-12-17 NOTE — Interval H&P Note (Signed)
History and Physical Interval Note:  12/17/2014 2:41 PM  [name]  has presented today for surgery, with the diagnosis of syncope, CM  The various methods of treatment have been discussed with the patient and family. After consideration of risks, benefits and other options for treatment, the patient has consented to  Procedure(s): BiV ICD Insertion CRT-D (N/A) as a surgical intervention .  The patient's history has been reviewed, patient examined, no change in status, stable for surgery.  I have reviewed the patient's chart and labs.  Questions were answered to the patient's satisfaction.     [name]

## 2014-12-17 NOTE — Progress Notes (Signed)
Patient had a 4 and 7 beat run of V-tach. Pt was asymptomatic, no chest pain, labs are all normal. He was trying to adjust his arm sling at the time. Paged physician on call to [place].

## 2014-12-18 ENCOUNTER — Inpatient Hospital Stay (HOSPITAL_COMMUNITY): Payer: Medicare Other

## 2014-12-18 ENCOUNTER — Encounter (HOSPITAL_COMMUNITY): Payer: Self-pay | Admitting: Internal Medicine

## 2014-12-18 DIAGNOSIS — I255 Ischemic cardiomyopathy: Secondary | ICD-10-CM

## 2014-12-18 LAB — MAGNESIUM: MAGNESIUM: 2 mg/dL (ref 1.7–2.4)

## 2014-12-18 NOTE — Progress Notes (Signed)
12/18/2014 12:50 PM Discharge AVS meds taken today and those due this evening reviewed.  Follow-up appointments and when to call md reviewed.  D/C IV and TELE.  Questions and concerns addressed.   D/C home per orders. [place]

## 2014-12-18 NOTE — Discharge Summary (Signed)
Physician Discharge Summary  [name] [id number] DOB: 11-20-1945 DOA: 12/15/2014  PCP: Peter [name], MD  Admit date: 12/15/2014 Discharge date: 12/18/2014  Time spent: 25  minutes  Recommendations for Outpatient Follow-up:  Follow-up cardiology in 2 weeks   Discharge Diagnoses:  Principal Problem:   Syncope and collapse versus seizures Active Problems:   Coronary artery disease   Hypercholesterolemia   History of hypertension   Gastroesophageal reflux disease   LBBB (left bundle branch block)   Aortic stenosis   Chronic chest pain   PVC's (premature ventricular contractions)   Chronic systolic CHF (congestive heart failure) (HCC)   Bilateral carotid bruits   Abdominal bruit   Discharge Condition: Stable  Diet recommendation: Low-salt diet  Filed Weights   12/15/14 2100  Weight: 96.3 kg (212 lb 4.9 oz)    History of present illness:  69 y.o. male , married, independent of activities of daily living, PMH of CAD status post CABG in 1996, Cardiac cath March 2016 showed high-grade disease in SVG to OM and despite successful PCI with DES has chronic chest pain-now suspected to be of GI origin and relieved by sublingual NTG, moderate aortic stenosis, HLD, obesity, Barrett's esophagus, LBBB, GERD, HTN, OSA noncompliant with CPAP, presented to [facility] ED on 12/15/14 with episodes of passing out. Patient and spouse at bedside provided history. Patient seen by neurology for possible seizure. EEG  ordered  Hospital Course:  1. Syncope no further episodes of syncope in the hospital, second troponin 0.07, cardiology consulted and patient i underwent  biventricular ICD placement implant. No more further episodes of syncope. Patient to be discharged home. Follow up with cardiology 2. ? Seizure- patient also had EEG done, which was normal neurology has signed off. 3. Essential hypertension- blood pressure is well controlled, continue Toprol-XL, ramipril. 4. Hyperlipidemia- continue  statins 5. CAD status post CABG, status post stent placement- stable, continue aspirin, Plavix, statins, beta blockers, nitrates. 2-D echo shows EF 30-35%. 6. Chronic chest pain- patient has chronic right-sided chest pain, worse at night and relieved by sublingual nitroglycerin glycerin. Follow-up troponin is normal 7. Moderate aortic stenosis- as per echo done yesterday patient has moderate to severe aortic stenosis, patient will need possible T aVR in near future as per cardiology.  Procedures:  ICD placement  Consultations:  Cardiology  Discharge Exam: Filed Vitals:   12/18/14 0501 12/18/14 0957  BP: 177/83 169/76  Pulse: 66 67  Temp: 98.8 F (37.1 C)   Resp: 18     General: Appears in no acute distress Cardiovascular: S1-S2 regular Respiratory: Clear to auscultation bilaterally  Discharge Instructions   Discharge Instructions    Diet - low sodium heart healthy    Complete by:  As directed      Increase activity slowly    Complete by:  As directed           Current Discharge Medication List    CONTINUE these medications which have NOT CHANGED   Details  Ascorbic Acid (VITAMIN C PO) Take 1,000 mg by mouth daily.     aspirin EC 81 MG EC tablet Take 1 tablet (81 mg total) by mouth daily.    atorvastatin (LIPITOR) 80 MG tablet TAKE 1 TABLET (80 MG TOTAL) BY MOUTH DAILY. Qty: 90 tablet, Refills: 3    Cholecalciferol (VITAMIN D PO) Take 5,000 Units by mouth daily.     clopidogrel (PLAVIX) 75 MG tablet Take 1 tablet (75 mg total) by mouth daily. Qty: 90 tablet, Refills: 3  finasteride (PROSCAR) 5 MG tablet Take 1 tablet (5 mg total) by mouth daily. Qty: 90 tablet, Refills: 3    isosorbide mononitrate (IMDUR) 60 MG 24 hr tablet Take 1 tablet (60 mg total) by mouth daily. Qty: 90 tablet, Refills: 3    metoprolol succinate (TOPROL-XL) 50 MG 24 hr tablet Take 1 tablet (50 mg total) by mouth daily. Take with or immediately following a meal. Qty: 90 tablet,  Refills: 3    nitroGLYCERIN (NITROSTAT) 0.4 MG SL tablet PLACE 1 TABLET (0.4 MG TOTAL) UNDER THE TONGUE EVERY 5 (FIVE) MINUTES AS NEEDED FOR CHEST PAIN. Qty: 100 tablet, Refills: 4    oxybutynin (DITROPAN) 5 MG tablet Take 5 mg by mouth daily.     pantoprazole (PROTONIX) 40 MG tablet Take 1 tablet (40 mg total) by mouth 2 (two) times daily. Qty: 180 tablet, Refills: 3    ramipril (ALTACE) 5 MG capsule Take 1 capsule (5 mg total) by mouth daily. Qty: 90 capsule, Refills: 3    valACYclovir (VALTREX) 500 MG tablet Take 500 mg by mouth daily as needed (outbreak).       STOP taking these medications     ibuprofen (ADVIL,MOTRIN) 200 MG tablet        No Known Allergies Follow-up Information    Follow up with [facility] On 01/02/2015.   Specialty:  Cardiology   Why:  11:30 wound check   Contact information:   [address] [place] [phone]      Follow up with [name], MD On 03/21/2015.   Specialty:  Cardiology   Why:  11:45AM   Contact information:   [address] [address] [phone]       Follow up with Peter [name], MD In 2 weeks.   Specialty:  Cardiology   Contact information:   [address] [place] [address] [phone]        The results of significant diagnostics from this hospitalization (including imaging, microbiology, ancillary and laboratory) are listed below for reference.    Significant Diagnostic Studies: Dg Chest 2 View  12/18/2014  CLINICAL DATA:  69 year old male status post pacemaker/defibrillator placement. Initial encounter. EXAM: CHEST  2 VIEW COMPARISON:  12/15/2014. FINDINGS: PA and lateral views of the chest. New left chest triple lead transvenous cardiac AICD. Leads course to the right atrium, RV apex, and coronary sinus regions. No pneumothorax. Stable lung volumes. Stable cardiomegaly and mediastinal contours. No pulmonary  edema or pleural effusion. No acute pulmonary opacity. Stable visualized osseous structures. Stable cholecystectomy clips. Calcified aortic atherosclerosis. IMPRESSION: Left chest cardiac AICD placed with no adverse features. Electronically Signed   By: Genevie Ann M.D.   On: 12/18/2014 07:16   Dg Chest 2 View  12/15/2014  CLINICAL DATA:  Seizure-like activity. Syncopal episode with head injury. EXAM: CHEST  2 VIEW COMPARISON:  CT 02/27/2014.  Radiographs 02/21/2014. FINDINGS: There is stable cardiac enlargement status post CABG. The lungs appear unchanged with mild interstitial prominence and a calcified right upper lobe granuloma. There is no confluent airspace opacity, edema or pleural effusion. Flowing osteophytes are noted throughout the thoracic spine. Cholecystectomy clips noted. IMPRESSION: Stable chest.  No acute cardiopulmonary process. Electronically Signed   By: Richardean Sale M.D.   On: 12/15/2014 16:44   Ct Head Wo Contrast  12/15/2014  CLINICAL DATA:  Syncopal episode with collapse. Left-sided headache. Initial encounter. EXAM: CT HEAD WITHOUT CONTRAST TECHNIQUE:  Contiguous axial images were obtained from the base of the skull through the vertex without intravenous contrast. COMPARISON:  None. FINDINGS: There is no evidence of acute intracranial hemorrhage, mass lesion, brain edema or extra-axial fluid collection. The ventricles and subarachnoid spaces are appropriately sized for age. There is no CT evidence of acute cortical infarction. There is a small focus of encephalomalacia in the right occipital white matter (image number 13). Intracranial vascular calcifications are noted. The visualized paranasal sinuses, mastoid air cells and middle ears are clear. The calvarium is intact. IMPRESSION: No acute intracranial findings. Electronically Signed   By: Richardean Sale M.D.   On: 12/15/2014 16:35   US Aorta  12/16/2014  CLINICAL DATA:  Abdominal bruit. EXAM: ULTRASOUND OF ABDOMINAL AORTA  TECHNIQUE: Ultrasound examination of the abdominal aorta was performed to evaluate for abdominal aortic aneurysm. COMPARISON:  None. FINDINGS: Abdominal Aorta There is mild fusiform dilation of the distal abdominal aorta. Maximum Diameter: Proximal abdominal aorta measures 1.7 x 1.6 cm. Mid abdominal aorta measures 1.7 x 1.6 cm. Distal abdominal aorta measures 2.0 x 2.1 cm. Right common iliac artery measures 1.0 x 1.2 cm. Left common iliac artery measures 0.9 x 1.1 cm. IMPRESSION: Mild fusiform dilation of the distal abdominal aorta, with maximum diameter of 2.1 cm. Electronically Signed   By: Fidela Salisbury M.D.   On: 12/16/2014 13:52    Microbiology: No results found for this or any previous visit (from the past 240 hour(s)).   Labs: Basic Metabolic Panel:  Recent Labs Lab 12/15/14 1520 12/15/14 2136 12/17/14 0219 12/18/14 0331  NA 140  --  141  --   K 3.9  --  3.8  --   CL 107  --  103  --   CO2 25  --  30  --   GLUCOSE 129*  --  99  --   BUN 21*  --  17  --   CREATININE 0.84  --  0.83  --   CALCIUM 9.3  --  8.9  --   MG  --  1.8 2.0 2.0   Liver Function Tests: No results for input(s): AST, ALT, ALKPHOS, BILITOT, PROT, ALBUMIN in the last 168 hours. No results for input(s): LIPASE, AMYLASE in the last 168 hours. No results for input(s): AMMONIA in the last 168 hours. CBC:  Recent Labs Lab 12/15/14 1520  WBC 9.2  HGB 14.9  HCT 43.6  MCV 91.2  PLT 189   Cardiac Enzymes:  Recent Labs Lab 12/15/14 2136 12/16/14 0327 12/16/14 0956  TROPONINI <0.03 0.07* <0.03   BNP: BNP (last 3 results) No results for input(s): BNP in the last 8760 hours.  ProBNP (last 3 results) No results for input(s): PROBNP in the last 8760 hours.  CBG: No results for input(s): GLUCAP in the last 168 hours.     SignedEleonore Chiquito S  [facility] 12/18/2014, 12:22 PM

## 2014-12-18 NOTE — Progress Notes (Signed)
SUBJECTIVE: The patient is doing well today.  At this time, he denies chest pain, shortness of breath, or any new concerns.  Marland Kitchen aspirin EC  81 mg Oral Daily  . atorvastatin  80 mg Oral q1800  .  ceFAZolin (ANCEF) IV  1 g Intravenous Q6H  . clopidogrel  75 mg Oral Daily  . finasteride  5 mg Oral Daily  . isosorbide mononitrate  60 mg Oral Daily  . magnesium oxide  400 mg Oral Daily  . metoprolol succinate  50 mg Oral Daily  . oxybutynin  5 mg Oral Daily  . pantoprazole  40 mg Oral BID  . ramipril  5 mg Oral Daily  . sodium chloride  3 mL Intravenous Q12H      OBJECTIVE: Physical Exam: Filed Vitals:   12/17/14 1845 12/17/14 1901 12/17/14 2111 12/18/14 0501  BP:  136/72 146/63 177/83  Pulse: 64 66 73 66  Temp:   98.7 F (37.1 C) 98.8 F (37.1 C)  TempSrc:   Oral Oral  Resp: 18 18 18 18   Height:      Weight:      SpO2: 90% 95% 96% 94%    Intake/Output Summary (Last 24 hours) at 12/18/14 [id number] Last data filed at 12/17/14 1300  Gross per 24 hour  Intake      0 ml  Output    300 ml  Net   -300 ml    Telemetry reveals sinus rhythm with V pacing, and AV pacing  GEN- The patient is well appearing, alert and oriented x 3 today.   Head- normocephalic, atraumatic Eyes-  Sclera clear, conjunctiva pink Ears- hearing intact Neck- supple, no JVP Lungs- Clear to ausculation bilaterally, normal work of breathing Heart- Regular rate and rhythm, 2/6 systolic murmur, no rubs or gallops GI- soft, NT, ND, + BS Extremities- no clubbing, cyanosis, or edema Skin- no rash or lesion, PPM site dressing is dry, no hematoma Psych- euthymic mood, full affect Neuro- no gross deficits appreciated  LABS: Basic Metabolic Panel:  Recent Labs  12/15/14 1520  12/17/14 0219 12/18/14 0331  NA 140  --  141  --   K 3.9  --  3.8  --   CL 107  --  103  --   CO2 25  --  30  --   GLUCOSE 129*  --  99  --   BUN 21*  --  17  --   CREATININE 0.84  --  0.83  --   CALCIUM 9.3  --  8.9  --     MG  --   < > 2.0 2.0  < > = values in this interval not displayed. Liver Function Tests: No results for input(s): AST, ALT, ALKPHOS, BILITOT, PROT, ALBUMIN in the last 72 hours. No results for input(s): LIPASE, AMYLASE in the last 72 hours. CBC:  Recent Labs  12/15/14 1520  WBC 9.2  HGB 14.9  HCT 43.6  MCV 91.2  PLT 189   Cardiac Enzymes:  Recent Labs  12/15/14 2136 12/16/14 0327 12/16/14 0956  TROPONINI <0.03 0.07* <0.03   *PRELIMINARY RESULTS* Vascular Ultrasound Carotid Duplex (Doppler) has been completed.  Findings suggest 1-39% internal carotid artery stenosis bilaterally. Vertebral arteries are patent with antegrade flow.  RADIOLOGY: Dg Chest 2 View 12/18/2014  CLINICAL DATA:  69 year old male status post pacemaker/defibrillator placement. Initial encounter. EXAM: CHEST  2 VIEW COMPARISON:  12/15/2014. FINDINGS: PA and lateral views of the chest. New left chest triple  lead transvenous cardiac AICD. Leads course to the right atrium, RV apex, and coronary sinus regions. No pneumothorax. Stable lung volumes. Stable cardiomegaly and mediastinal contours. No pulmonary edema or pleural effusion. No acute pulmonary opacity. Stable visualized osseous structures. Stable cholecystectomy clips. Calcified aortic atherosclerosis. IMPRESSION: Left chest cardiac AICD placed with no adverse features. Electronically Signed   By: Genevie Ann M.D.   On: 12/18/2014 07:16   DEVICE: Medtronic (serial Number [id number]) biventricular ICD,  Medtronic(serial number [id number]) LV lead,  medtronic [id number] (serial # [id number] ) right atrial lead and a Medtronic [id number] (serial number [id number]) right ventricular defibrillator lead   ASSESSMENT AND PLAN:  Principal Problem:   Syncope and collapse versus seizures Active Problems:   Coronary artery disease   Hypercholesterolemia   History of hypertension   Gastroesophageal reflux disease   LBBB (left bundle branch block)   Aortic stenosis   Chronic  chest pain   PVC's (premature ventricular contractions)   Chronic systolic CHF (congestive heart failure) (HCC)   Bilateral carotid bruits   Abdominal bruit  1. Syncope  ICM, with EF 30-35%, and NSVT, frequent VPC's on telemetry (chronically on BB/ACE, March 2016 EF 35-40%)  LBBB  Mod-severe AS on his echo this admit  Carotid bruits: prelim result noted  Abd bruit without significant findings AO by [place]     S/p CRT ICD implant yesterday with Dr. Lovena Le     Device check today with normal function     CXR is without pneumothorax     Wound care and left arm restrictions discussed with the patient.      F/u wound check and visit made for the patient  NO driving [id number], the patient is instructed  2. CAD  Remote CABG, most recently Cath with PCI to SVG-OM2 with DES on 04/02/14  no ACS (<0.03, 0.07, <0.03)  On ASA, Plavix, BB, statin, nitrate  3. HTN  Follow with current  Electrophysiology team to see as needed while here. Please call with questions.   Tommye Standard, PA-C 12/18/2014 9:28 AM   EP Attending  Patient seen and examined. Agree with above. The patient is doing well this morning and is stable for discharge. His device is working normally. Will have the patient undergo usual followup. Driving restrictions as noted above.  Mikle Bosworth.D.

## 2014-12-18 NOTE — Progress Notes (Signed)
Subjective: No further events.   Exam: Filed Vitals:   12/17/14 2111 12/18/14 0501  BP: 146/63 177/83  Pulse: 73 66  Temp: 98.7 F (37.1 C) 98.8 F (37.1 C)  Resp: 18 18   Gen: In bed, NAD Resp: non-labored breathing, no acute distress Abd: soft, nt  Neuro: MS: awake, alert, interactive and appropriate.   EEG: negative  Impression: 69 yo M with transient alterations in awareness of unclear etiology. I suspect that these are more likely to be related to hypoperfusion rather than seizure, but this is not definite. I would not pursue empiric AED trial unless events continue after pacemaker with no apparent cause.   Recommendations: 1) No AED at this time.  2) further workup/treatment only if the events continue.  3) Please call with any further questions or concerns.   Roland Rack, MD [facility] [phone]  If 7pm- 7am, please page neurology on call as listed in [place].

## 2014-12-24 ENCOUNTER — Ambulatory Visit (INDEPENDENT_AMBULATORY_CARE_PROVIDER_SITE_OTHER): Payer: Medicare Other | Admitting: Cardiology

## 2014-12-24 ENCOUNTER — Encounter: Payer: Self-pay | Admitting: Cardiology

## 2014-12-24 VITALS — BP 130/80 | HR 58 | Ht 70.0 in | Wt 212.9 lb

## 2014-12-24 DIAGNOSIS — I209 Angina pectoris, unspecified: Secondary | ICD-10-CM

## 2014-12-24 DIAGNOSIS — I447 Left bundle-branch block, unspecified: Secondary | ICD-10-CM

## 2014-12-24 DIAGNOSIS — R55 Syncope and collapse: Secondary | ICD-10-CM

## 2014-12-24 DIAGNOSIS — I5022 Chronic systolic (congestive) heart failure: Secondary | ICD-10-CM | POA: Diagnosis not present

## 2014-12-24 DIAGNOSIS — Z8249 Family history of ischemic heart disease and other diseases of the circulatory system: Secondary | ICD-10-CM | POA: Diagnosis not present

## 2014-12-24 NOTE — Progress Notes (Signed)
[name] Date of Birth: 07-06-1945 Medical Record [id number]  History of Present Illness: [name] is seen for follow up after recent hospitalization with syncope.  He has a known history of coronary disease and is status post CABG in 1996. This included an LIMA graft to the LAD and diagonal, sequential vein graft to the acute marginal, PDA, and posterior lateral branches of the right coronary, sequential saphenous vein graft to the obtuse marginal vessel and distal circumflex. He had a cardiac catheterization in 2007 which showed that his grafts were all patent. On 04/02/14 he had repeat cath for chest pain and grafts were again patent but there was a 90% stenosis in the SVG to OM2. This was stented with a DES.  Despite this intervention he has continued to have chest pain. He had cholecystectomy in January without complications and without relief of his pain. CXR was unremarkable. CT of the chest was negative for PE or aortic pathology. He had EGD by Dr. Oletta Lamas that showed Barrett's esophagus but no evidence of esophagitis. He is on Protonix.  Recently he was admitted for evaluation of syncope. His first episode occurred one month prior and he states he got dizzy after bending over. More recently he passed out in his hallway. This was preceded by dizziness. His glasses bruised his face and orbit. Later that same day he was sitting on his couch when he passed out again. Wife noted his eyes were open but he was "out". On admission troponin was mildly elevated at .07. Echo showed EF 30-35% with moderate to severe AS. Mean gradient 24 mm Hg. AVA 0.8-0.9. There was concern that AVA was underestimated due to low output. EEG was negative. He had a LBBB. He underwent placement of a BIV-ICD by Dr. Lovena Le.  On follow up today he is feeling well. No recurrent dizziness or syncope. No palpitations. No dyspnea. No change in chronic chest pain symptoms.   Current Outpatient Prescriptions on File Prior to Visit   Medication Sig Dispense Refill  . Ascorbic Acid (VITAMIN C PO) Take 1,000 mg by mouth daily.     Marland Kitchen aspirin EC 81 MG EC tablet Take 1 tablet (81 mg total) by mouth daily.    Marland Kitchen atorvastatin (LIPITOR) 80 MG tablet TAKE 1 TABLET (80 MG TOTAL) BY MOUTH DAILY. 90 tablet 3  . Cholecalciferol (VITAMIN D PO) Take 5,000 Units by mouth daily.     . clopidogrel (PLAVIX) 75 MG tablet Take 1 tablet (75 mg total) by mouth daily. 90 tablet 3  . finasteride (PROSCAR) 5 MG tablet Take 1 tablet (5 mg total) by mouth daily. 90 tablet 3  . isosorbide mononitrate (IMDUR) 60 MG 24 hr tablet Take 1 tablet (60 mg total) by mouth daily. 90 tablet 3  . metoprolol succinate (TOPROL-XL) 50 MG 24 hr tablet Take 1 tablet (50 mg total) by mouth daily. Take with or immediately following a meal. 90 tablet 3  . nitroGLYCERIN (NITROSTAT) 0.4 MG SL tablet PLACE 1 TABLET (0.4 MG TOTAL) UNDER THE TONGUE EVERY 5 (FIVE) MINUTES AS NEEDED FOR CHEST PAIN. 100 tablet 4  . oxybutynin (DITROPAN) 5 MG tablet Take 5 mg by mouth daily.     . pantoprazole (PROTONIX) 40 MG tablet Take 1 tablet (40 mg total) by mouth 2 (two) times daily. 180 tablet 3  . ramipril (ALTACE) 5 MG capsule Take 1 capsule (5 mg total) by mouth daily. 90 capsule 3  . valACYclovir (VALTREX) 500 MG tablet Take 500 mg  by mouth daily as needed (outbreak).      No current facility-administered medications on file prior to visit.    No Known Allergies  Past Medical History  Diagnosis Date  . Coronary artery disease     a. CABG 1996: LIMA graft to the LAD and diagonal, sequential vein graft to the acute marginal, PDA, and posterior lateral branches of the right coronary, sequential saphenous vein graft to the obtuse marginal vessel and distal circumflex. b. Cath 2007 patent grafts. c. s/p DES to SVG-OM2 in 03/2014.  Marland Kitchen History of obesity   . Hypercholesterolemia   . History of hiatal hernia   . History of kidney stones   . History of erectile dysfunction   .  Gastroesophageal reflux disease   . History of prostatitis   . Hypertension   . Kidney stones   . OSA (obstructive sleep apnea)     "suppose to wear mask; I don't" (03/13/2014)  . Aortic stenosis, moderate   . Barrett's esophageal ulceration   . LBBB (left bundle branch block)   . PVC's (premature ventricular contractions)     Past Surgical History  Procedure Laterality Date  . Cholecystectomy N/A 01/23/2014    Procedure: LAPAROSCOPIC CHOLECYSTECTOMY WITH INTRAOPERATIVE CHOLANGIOGRAM ;  Surgeon: Fanny Skates, MD;  Location: [place];  Service: General;  Laterality: N/A;  . Coronary artery bypass graft  1996    "CABG X 7"  . Cardiac catheterization  1996; ~ 2006    ; Ejection fraction is estimated at 45%  . Coronary angioplasty with stent placement  03/13/2014    "1"  . Tonsillectomy  ~ 1950  . Colonoscopy w/ polypectomy    . Cystoscopy with stent placement    . Left heart catheterization with coronary/graft angiogram N/A 03/13/2014    Procedure: LEFT HEART CATHETERIZATION WITH Beatrix Fetters;  Surgeon: [name] [name], MD;  Location: [facility] CATH LAB;  Service: Cardiovascular;  Laterality: N/A;  . Ep implantable device N/A 12/17/2014    Procedure: BiV ICD Insertion CRT-D;  Surgeon: Evans Lance, MD;  Location: [place] CV LAB;  Service: Cardiovascular;  Laterality: N/A;    History  Smoking status  . Former Smoker -- 0.75 packs/day for 15 years  . Types: Cigarettes  . Quit date: 01/12/1997  Smokeless tobacco  . Never Used    Comment: 3/4 pack per day. smoked 15 years off and on    History  Alcohol Use  . 2.4 oz/week  . 0 Standard drinks or equivalent, 4 Cans of beer per week    Family History  Problem Relation Age of Onset  . Heart attack Father   . Cancer Sister     Uterine Cancer    Review of Systems: As noted in HPI.  All other systems were reviewed and are negative.  Physical Exam: BP 130/80 mmHg  Pulse 58  Ht 5\' 10"  (1.778 m)  Wt 96.571 kg (212 lb 14.4  oz)  BMI 30.55 kg/m2 He is a pleasant white male in no acute distress.The patient is alert and oriented x 3.    The skin is warm and dry.   The HEENT exam is normal.  The carotids are 2+ without bruits.  There is no thyromegaly.  There is no JVD.  The lungs are clear.  The chest wall is non tender.  The heart exam reveals a regular rate with a normal S1 and S2.  There is a harsh 99991111 systolic murmur in the RUSB. The PMI is  not displaced.   ICD site is healing well with mild swelling. Abdominal exam reveals good bowel sounds.  There is no guarding or rebound.  There is no hepatosplenomegaly or tenderness.  There are no masses.  Exam of the legs reveal no clubbing, cyanosis, or edema.  The legs are without rashes.  The distal pulses are intact.  Cranial nerves II - XII are intact.  Motor and sensory functions are intact.  The gait is normal.  LABORATORY DATA: Echo 12/16/14:Study Conclusions Study Conclusions  - Left ventricle: The cavity size was mildly dilated. There was severe concentric hypertrophy. Systolic function was moderately to severely reduced. The estimated ejection fraction was in the range of 30% to 35%. There is akinesis of the apicalanterolateral and apical myocardium. Doppler parameters are consistent with abnormal left ventricular relaxation (grade 1 diastolic dysfunction). - Ventricular septum: Septal motion showed abnormal function and dyssynergy. - Aortic valve: Cusp separation was reduced. There was moderate to severe stenosis. Stenosis severity may be underestimated by reduced EF. Peak velocity (S): 366 cm/s. Mean gradient (S): 24 mm Hg. Valve area (VTI): 0.94 cm^2. Valve area (Vmax): 0.84 cm^2. Valve area (Vmean): 0.92 cm^2. - Mitral valve: There was mild regurgitation. - Left atrium: The atrium was severely dilated.  Impressions:  - When compared to prior, EF remains reduced and aortic stenosis has advanced.    Lab Results  Component  Value Date   WBC 9.2 12/15/2014   HGB 14.9 12/15/2014   HCT 43.6 12/15/2014   PLT 189 12/15/2014   GLUCOSE 99 12/17/2014   CHOL 146 08/03/2012   TRIG 106.0 08/03/2012   HDL 39.20 08/03/2012   LDLCALC 86 08/03/2012   ALT 36 01/18/2014   AST 44* 01/18/2014   NA 141 12/17/2014   K 3.8 12/17/2014   CL 103 12/17/2014   CREATININE 0.83 12/17/2014   BUN 17 12/17/2014   CO2 30 12/17/2014   TSH 1.572 12/16/2014   INR 1.07 03/06/2014    Assessment / Plan: 1. Coronary disease with remote coronary bypass surgery in 1996. Cardiac catheterization 2007 showed patent grafts. Myoview study in January 2015 showed no ischemia with lateral infarct. Cath in March 2016 showed high grade disease in SVG to OM. Despite successful PCI with DES chronic chest pain symptoms have persisted and are not exertional. Plan to continue DAPT for one year.   2. Chronic chest pain. Symptoms are consistent with a GI source.   He does get relief with sublingual nitroglycerin. Continue Protonix. Extensive evaluation for other causes has been negative.  Continue to avoid foods that exacerbate his symptoms.   3. Moderate-severe aortic stenosis. Possibly underestimated due to low output. Will repeat Echo in March to assess progression and to reassess post BiV ICD. If AS progresses or if EF drops will need to consider AVR with either TAVR or redo open heart depending on status of his CAD at the time.  4. Syncope- history is consistent with arrhythmogenic cause. Now s/p BiV ICD. Will monitor. No driving for 6 months.  5. Obesity.   6. Hyperlipidemia.   7. LBBB

## 2014-12-24 NOTE — Patient Instructions (Signed)
We will schedule you for a follow up Echo. I will see you in 4 months.

## 2015-01-02 ENCOUNTER — Ambulatory Visit (INDEPENDENT_AMBULATORY_CARE_PROVIDER_SITE_OTHER): Payer: Medicare Other | Admitting: *Deleted

## 2015-01-02 ENCOUNTER — Encounter: Payer: Self-pay | Admitting: Internal Medicine

## 2015-01-02 DIAGNOSIS — I5022 Chronic systolic (congestive) heart failure: Secondary | ICD-10-CM | POA: Diagnosis not present

## 2015-01-02 DIAGNOSIS — I447 Left bundle-branch block, unspecified: Secondary | ICD-10-CM

## 2015-01-02 LAB — CUP PACEART INCLINIC DEVICE CHECK
Battery Voltage: 3.09 V
Brady Statistic AP VS Percent: 0.72 %
Brady Statistic RA Percent Paced: 39.97 %
Brady Statistic RV Percent Paced: 68.99 %
HighPow Impedance: 55 Ohm
Implantable Lead Implant Date: 20161205
Implantable Lead Implant Date: 20161205
Implantable Lead Location: 753859
Implantable Lead Model: 5076
Implantable Lead Model: 6935
Lead Channel Impedance Value: 342 Ohm
Lead Channel Impedance Value: 361 Ohm
Lead Channel Impedance Value: 361 Ohm
Lead Channel Impedance Value: 361 Ohm
Lead Channel Impedance Value: 418 Ohm
Lead Channel Impedance Value: 589 Ohm
Lead Channel Impedance Value: 608 Ohm
Lead Channel Impedance Value: 608 Ohm
Lead Channel Impedance Value: 608 Ohm
Lead Channel Pacing Threshold Pulse Width: 0.4 ms
Lead Channel Pacing Threshold Pulse Width: 0.4 ms
Lead Channel Sensing Intrinsic Amplitude: 19.5 mV
Lead Channel Setting Pacing Amplitude: 2 V
Lead Channel Setting Pacing Amplitude: 3.5 V
Lead Channel Setting Pacing Amplitude: 3.5 V
Lead Channel Setting Pacing Pulse Width: 0.4 ms
MDC IDC LEAD IMPLANT DT: 20161205
MDC IDC LEAD LOCATION: 753858
MDC IDC LEAD LOCATION: 753860
MDC IDC LEAD MODEL: 4598
MDC IDC MSMT BATTERY REMAINING LONGEVITY: 96 mo
MDC IDC MSMT LEADCHNL LV IMPEDANCE VALUE: 361 Ohm
MDC IDC MSMT LEADCHNL LV IMPEDANCE VALUE: 475 Ohm
MDC IDC MSMT LEADCHNL LV IMPEDANCE VALUE: 646 Ohm
MDC IDC MSMT LEADCHNL LV PACING THRESHOLD AMPLITUDE: 1 V
MDC IDC MSMT LEADCHNL RA IMPEDANCE VALUE: 456 Ohm
MDC IDC MSMT LEADCHNL RA PACING THRESHOLD AMPLITUDE: 0.75 V
MDC IDC MSMT LEADCHNL RA PACING THRESHOLD PULSEWIDTH: 0.4 ms
MDC IDC MSMT LEADCHNL RA SENSING INTR AMPL: 1.75 mV
MDC IDC MSMT LEADCHNL RV PACING THRESHOLD AMPLITUDE: 0.75 V
MDC IDC SESS DTM: 20161221164755
MDC IDC SET LEADCHNL LV PACING PULSEWIDTH: 0.4 ms
MDC IDC SET LEADCHNL RV SENSING SENSITIVITY: 0.3 mV
MDC IDC STAT BRADY AP VP PERCENT: 39.25 %
MDC IDC STAT BRADY AS VP PERCENT: 56.53 %
MDC IDC STAT BRADY AS VS PERCENT: 3.5 %

## 2015-01-02 NOTE — Progress Notes (Signed)
Wound check appointment. Steri-strips removed. Wound without redness or edema. Incision edges approximated, wound well healed. Normal device function. Thresholds, sensing, and impedances consistent with implant measurements. Device programmed at 3.5V for extra safety margin until 3 month visit. Histogram distribution appropriate for patient and level of activity. No mode switches. 2 Vs episodes, markers indicate possible NSVT, longest ~7sec, peak V 143bpm, reprogrammed VT monitor interval from 362ms (171bpm) to 491ms (150bpm) per GT. Patient educated about wound care, arm mobility, lifting restrictions, shock plan. ROV with GT on 03/21/15 at 9:00am.

## 2015-02-02 ENCOUNTER — Other Ambulatory Visit: Payer: Self-pay | Admitting: Cardiology

## 2015-02-04 NOTE — Telephone Encounter (Signed)
Rx request sent to pharmacy.  

## 2015-03-13 LAB — LIPID PANEL
Cholesterol: 150 mg/dL (ref 125–200)
HDL: 38 mg/dL — ABNORMAL LOW (ref >=40)
LDL CALC: 91 mg/dL (ref <130)
TRIGLYCERIDES: 105 mg/dL (ref <150)
Total CHOL/HDL Ratio: 3.9 Ratio (ref <=5.0)
VLDL: 21 mg/dL (ref <30)

## 2015-03-13 LAB — HEPATIC FUNCTION PANEL
ALK PHOS: 63 U/L (ref 40–115)
ALT: 31 U/L (ref 9–46)
AST: 34 U/L (ref 10–35)
Albumin: 3.9 g/dL (ref 3.6–5.1)
BILIRUBIN DIRECT: 0.3 mg/dL — AB (ref <=0.2)
BILIRUBIN INDIRECT: 1.5 mg/dL — AB (ref 0.2–1.2)
Total Bilirubin: 1.8 mg/dL — ABNORMAL HIGH (ref 0.2–1.2)
Total Protein: 6.1 g/dL (ref 6.1–8.1)

## 2015-03-13 LAB — BASIC METABOLIC PANEL
BUN: 20 mg/dL (ref 7–25)
CHLORIDE: 104 mmol/L (ref 98–110)
CO2: 30 mmol/L (ref 20–31)
CREATININE: 0.81 mg/dL (ref 0.70–1.18)
Calcium: 9 mg/dL (ref 8.6–10.3)
Glucose, Bld: 91 mg/dL (ref 65–99)
POTASSIUM: 4.3 mmol/L (ref 3.5–5.3)
Sodium: 141 mmol/L (ref 135–146)

## 2015-03-18 ENCOUNTER — Ambulatory Visit (HOSPITAL_COMMUNITY): Payer: Medicare Other | Attending: Cardiovascular Disease

## 2015-03-18 ENCOUNTER — Other Ambulatory Visit: Payer: Self-pay

## 2015-03-18 DIAGNOSIS — Z6830 Body mass index (BMI) 30.0-30.9, adult: Secondary | ICD-10-CM | POA: Insufficient documentation

## 2015-03-18 DIAGNOSIS — I209 Angina pectoris, unspecified: Secondary | ICD-10-CM

## 2015-03-18 DIAGNOSIS — I447 Left bundle-branch block, unspecified: Secondary | ICD-10-CM | POA: Diagnosis not present

## 2015-03-18 DIAGNOSIS — Z8249 Family history of ischemic heart disease and other diseases of the circulatory system: Secondary | ICD-10-CM | POA: Insufficient documentation

## 2015-03-18 DIAGNOSIS — I350 Nonrheumatic aortic (valve) stenosis: Secondary | ICD-10-CM | POA: Insufficient documentation

## 2015-03-18 DIAGNOSIS — Z951 Presence of aortocoronary bypass graft: Secondary | ICD-10-CM | POA: Diagnosis not present

## 2015-03-18 DIAGNOSIS — I110 Hypertensive heart disease with heart failure: Secondary | ICD-10-CM | POA: Diagnosis not present

## 2015-03-18 DIAGNOSIS — I5022 Chronic systolic (congestive) heart failure: Secondary | ICD-10-CM | POA: Insufficient documentation

## 2015-03-18 DIAGNOSIS — E669 Obesity, unspecified: Secondary | ICD-10-CM | POA: Diagnosis not present

## 2015-03-18 DIAGNOSIS — R55 Syncope and collapse: Secondary | ICD-10-CM | POA: Diagnosis not present

## 2015-03-18 DIAGNOSIS — E785 Hyperlipidemia, unspecified: Secondary | ICD-10-CM | POA: Insufficient documentation

## 2015-03-18 DIAGNOSIS — I059 Rheumatic mitral valve disease, unspecified: Secondary | ICD-10-CM | POA: Diagnosis not present

## 2015-03-18 DIAGNOSIS — I071 Rheumatic tricuspid insufficiency: Secondary | ICD-10-CM | POA: Insufficient documentation

## 2015-03-18 DIAGNOSIS — R29898 Other symptoms and signs involving the musculoskeletal system: Secondary | ICD-10-CM | POA: Insufficient documentation

## 2015-03-19 ENCOUNTER — Other Ambulatory Visit: Payer: Self-pay

## 2015-03-19 DIAGNOSIS — I25729 Atherosclerosis of autologous artery coronary artery bypass graft(s) with unspecified angina pectoris: Secondary | ICD-10-CM

## 2015-03-19 DIAGNOSIS — I350 Nonrheumatic aortic (valve) stenosis: Secondary | ICD-10-CM

## 2015-03-19 DIAGNOSIS — I5022 Chronic systolic (congestive) heart failure: Secondary | ICD-10-CM

## 2015-03-21 ENCOUNTER — Ambulatory Visit (INDEPENDENT_AMBULATORY_CARE_PROVIDER_SITE_OTHER): Payer: Medicare Other | Admitting: Internal Medicine

## 2015-03-21 ENCOUNTER — Encounter: Payer: Medicare Other | Admitting: Internal Medicine

## 2015-03-21 ENCOUNTER — Encounter: Payer: Self-pay | Admitting: Internal Medicine

## 2015-03-21 VITALS — BP 120/60 | HR 65 | Ht 70.0 in | Wt 215.0 lb

## 2015-03-21 DIAGNOSIS — I5022 Chronic systolic (congestive) heart failure: Secondary | ICD-10-CM | POA: Diagnosis not present

## 2015-03-21 LAB — CUP PACEART INCLINIC DEVICE CHECK
Battery Remaining Longevity: 95 mo
Brady Statistic AP VP Percent: 34.45 %
Brady Statistic AP VS Percent: 0.59 %
Brady Statistic AS VS Percent: 2.36 %
HIGH POWER IMPEDANCE MEASURED VALUE: 65 Ohm
Implantable Lead Implant Date: 20161205
Implantable Lead Location: 753860
Implantable Lead Model: 4598
Implantable Lead Model: 6935
Lead Channel Impedance Value: 304 Ohm
Lead Channel Impedance Value: 361 Ohm
Lead Channel Impedance Value: 399 Ohm
Lead Channel Impedance Value: 418 Ohm
Lead Channel Impedance Value: 456 Ohm
Lead Channel Impedance Value: 608 Ohm
Lead Channel Impedance Value: 665 Ohm
Lead Channel Impedance Value: 703 Ohm
Lead Channel Pacing Threshold Amplitude: 0.625 V
Lead Channel Pacing Threshold Amplitude: 0.75 V
Lead Channel Pacing Threshold Pulse Width: 0.4 ms
Lead Channel Sensing Intrinsic Amplitude: 1.875 mV
Lead Channel Setting Pacing Amplitude: 1.75 V
Lead Channel Setting Pacing Amplitude: 2 V
Lead Channel Setting Pacing Amplitude: 2.5 V
Lead Channel Setting Pacing Pulse Width: 0.4 ms
MDC IDC LEAD IMPLANT DT: 20161205
MDC IDC LEAD IMPLANT DT: 20161205
MDC IDC LEAD LOCATION: 753858
MDC IDC LEAD LOCATION: 753859
MDC IDC MSMT BATTERY VOLTAGE: 3.01 V
MDC IDC MSMT LEADCHNL LV IMPEDANCE VALUE: 361 Ohm
MDC IDC MSMT LEADCHNL LV IMPEDANCE VALUE: 418 Ohm
MDC IDC MSMT LEADCHNL LV IMPEDANCE VALUE: 551 Ohm
MDC IDC MSMT LEADCHNL LV IMPEDANCE VALUE: 646 Ohm
MDC IDC MSMT LEADCHNL LV PACING THRESHOLD PULSEWIDTH: 0.4 ms
MDC IDC MSMT LEADCHNL RA IMPEDANCE VALUE: 456 Ohm
MDC IDC MSMT LEADCHNL RA PACING THRESHOLD AMPLITUDE: 0.625 V
MDC IDC MSMT LEADCHNL RA SENSING INTR AMPL: 1.875 mV
MDC IDC MSMT LEADCHNL RV PACING THRESHOLD PULSEWIDTH: 0.4 ms
MDC IDC MSMT LEADCHNL RV SENSING INTR AMPL: 22 mV
MDC IDC MSMT LEADCHNL RV SENSING INTR AMPL: 22.625 mV
MDC IDC SESS DTM: 20170309153209
MDC IDC SET LEADCHNL LV PACING PULSEWIDTH: 0.4 ms
MDC IDC SET LEADCHNL RV SENSING SENSITIVITY: 0.3 mV
MDC IDC STAT BRADY AS VP PERCENT: 62.61 %
MDC IDC STAT BRADY RA PERCENT PACED: 35.04 %
MDC IDC STAT BRADY RV PERCENT PACED: 67.65 %

## 2015-03-21 NOTE — Patient Instructions (Signed)
Medication Instructions:  Your physician recommends that you continue on your current medications as directed. Please refer to the Current Medication list given to you today.   Labwork: None ordered   Testing/Procedures: None ordered   Follow-Up: Your physician recommends that you schedule a follow-up appointment in: 3 months with Dr Taylor   Any Other Special Instructions Will Be Listed Below (If Applicable).     If you need a refill on your cardiac medications before your next appointment, please call your pharmacy.   

## 2015-03-21 NOTE — Progress Notes (Signed)
HPI [name] returns today for followup. He is a pleasant 70 yo man with an ICM, chronic systolic heart failure, and LBBB, s/p BIV ICD implant 3 months ago. He c/o dizziness and periods of a lack of energy. No other complaints. No ICD shock. No Known Allergies   Current Outpatient Prescriptions  Medication Sig Dispense Refill  . Ascorbic Acid (VITAMIN C PO) Take 1,000 mg by mouth daily.     Marland Kitchen aspirin EC 81 MG EC tablet Take 1 tablet (81 mg total) by mouth daily.    Marland Kitchen atorvastatin (LIPITOR) 80 MG tablet TAKE 1 TABLET (80 MG TOTAL) BY MOUTH DAILY. 90 tablet 3  . Cholecalciferol (VITAMIN D PO) Take 5,000 Units by mouth daily.     . clopidogrel (PLAVIX) 75 MG tablet Take 1 tablet (75 mg total) by mouth daily. 90 tablet 3  . finasteride (PROSCAR) 5 MG tablet Take 1 tablet (5 mg total) by mouth daily. 90 tablet 3  . isosorbide mononitrate (IMDUR) 60 MG 24 hr tablet Take 1 tablet (60 mg total) by mouth daily. 90 tablet 3  . metoprolol succinate (TOPROL-XL) 50 MG 24 hr tablet Take 1 tablet (50 mg total) by mouth daily. Take with or immediately following a meal. 90 tablet 3  . nitroGLYCERIN (NITROSTAT) 0.4 MG SL tablet PLACE 1 TABLET (0.4 MG TOTAL) UNDER THE TONGUE EVERY 5 (FIVE) MINUTESAS NEEDED FOR CHEST PAIN. 100 tablet 0  . oxybutynin (DITROPAN) 5 MG tablet Take 5 mg by mouth daily.     . pantoprazole (PROTONIX) 40 MG tablet Take 1 tablet (40 mg total) by mouth 2 (two) times daily. 180 tablet 3  . ramipril (ALTACE) 5 MG capsule Take 1 capsule (5 mg total) by mouth daily. 90 capsule 3  . valACYclovir (VALTREX) 500 MG tablet Take 500 mg by mouth daily as needed (outbreak).      No current facility-administered medications for this visit.     Past Medical History  Diagnosis Date  . Coronary artery disease     a. CABG 1996: LIMA graft to the LAD and diagonal, sequential vein graft to the acute marginal, PDA, and posterior lateral branches of the right coronary, sequential saphenous  vein graft to the obtuse marginal vessel and distal circumflex. b. Cath 2007 patent grafts. c. s/p DES to SVG-OM2 in 03/2014.  Marland Kitchen History of obesity   . Hypercholesterolemia   . History of hiatal hernia   . History of kidney stones   . History of erectile dysfunction   . Gastroesophageal reflux disease   . History of prostatitis   . Hypertension   . Kidney stones   . OSA (obstructive sleep apnea)     "suppose to wear mask; I don't" (03/13/2014)  . Aortic stenosis, moderate   . Barrett's esophageal ulceration   . LBBB (left bundle branch block)   . PVC's (premature ventricular contractions)     ROS:   All systems reviewed and negative except as noted in the HPI.   Past Surgical History  Procedure Laterality Date  . Cholecystectomy N/A 01/23/2014    Procedure: LAPAROSCOPIC CHOLECYSTECTOMY WITH INTRAOPERATIVE CHOLANGIOGRAM ;  Surgeon: Fanny Skates, MD;  Location: [place];  Service: General;  Laterality: N/A;  . Coronary artery bypass graft  1996    "CABG X 7"  . Cardiac catheterization  1996; ~ 2006    ; Ejection fraction is estimated at 45%  . Coronary angioplasty with stent placement  03/13/2014    "  1"  . Tonsillectomy  ~ 1950  . Colonoscopy w/ polypectomy    . Cystoscopy with stent placement    . Left heart catheterization with coronary/graft angiogram N/A 03/13/2014    Procedure: LEFT HEART CATHETERIZATION WITH Beatrix Fetters;  Surgeon: [name] [name], MD;  Location: [facility] CATH LAB;  Service: Cardiovascular;  Laterality: N/A;  . Ep implantable device N/A 12/17/2014    Procedure: BiV ICD Insertion CRT-D;  Surgeon: Evans Lance, MD;  Location: [place] CV LAB;  Service: Cardiovascular;  Laterality: N/A;     Family History  Problem Relation Age of Onset  . Heart attack Father   . Cancer Sister     Uterine Cancer     Social History   Social History  . Marital Status: Married    Spouse Name: N/A  . Number of Children: 3  . Years of Education: N/A    Occupational History  . sales     retired   Social History Main Topics  . Smoking status: Former Smoker -- 0.75 packs/day for 15 years    Types: Cigarettes    Quit date: 01/12/1997  . Smokeless tobacco: Never Used     Comment: 3/4 pack per day. smoked 15 years off and on  . Alcohol Use: 2.4 oz/week    0 Standard drinks or equivalent, 4 Cans of beer per week  . Drug Use: No  . Sexual Activity: Yes   Other Topics Concern  . Not on file   Social History Narrative     BP 120/60 mmHg  Pulse 65  Ht 5\' 10"  (1.778 m)  Wt 215 lb (97.523 kg)  BMI 30.85 kg/m2  Physical Exam:  Well appearing 70 yo man, NAD HEENT: Unremarkable Neck:  6 cm JVD, no thyromegally Lymphatics:  No adenopathy Back:  No CVA tenderness Lungs:  Clear with no wheezes HEART:  Regular rate rhythm, no murmurs, no rubs, no clicks Abd:  soft, positive bowel sounds, no organomegally, no rebound, no guarding Ext:  2 plus pulses, no edema, no cyanosis, no clubbing Skin:  No rashes no nodules Neuro:  CN II through XII intact, motor grossly intact  EKG - nsr with PVC's and ventricular pacing  DEVICE  Normal device function.  See PaceArt for details.   Assess/Plan: 1. Chronic systolic heart failure - his symptoms are class 2-3. He will continue his current meds. 2. Dizziness - this is his biggest complaint. I have asked him to record a log of his vitals.  3. ICD - he is s/p BiV ICD Imlant. His ECG today does not show a Q wave in 1 or L with BiV pacing. We have reprogrammed his device to provide more lateral wall capture. 4. CAD - he denies anginal symptoms.   [name], M.D.

## 2015-03-22 ENCOUNTER — Telehealth: Payer: Self-pay | Admitting: Cardiology

## 2015-03-22 NOTE — Telephone Encounter (Signed)
New Message  Pt wife called request a call back to discuss a referral to a [facility]. Because doctor taylor stated in [place] that it could be an inner ear problem.

## 2015-03-22 NOTE — Telephone Encounter (Signed)
FORWARD TO Janan Halter RN

## 2015-03-22 NOTE — Telephone Encounter (Signed)
Spoke with patient's wife and will discuss with Dr Lovena Le on Monday and call her back with referral.

## 2015-03-26 NOTE — Telephone Encounter (Signed)
Dr Vicie Mutters is who Dr Lovena Le would recommend

## 2015-03-28 ENCOUNTER — Other Ambulatory Visit: Payer: Self-pay

## 2015-03-28 ENCOUNTER — Other Ambulatory Visit: Payer: Self-pay | Admitting: Cardiology

## 2015-03-28 DIAGNOSIS — R42 Dizziness and giddiness: Secondary | ICD-10-CM

## 2015-03-28 NOTE — Telephone Encounter (Signed)
REFILL 

## 2015-03-28 NOTE — Telephone Encounter (Signed)
Spoke to patient he stated he saw Dr.Taylor this week and Dr.Taylor advised his dizziness not from heart.Stated he was told to call Dr.Jordan for a referral to a ENT.Advised our scheduler will call back with appointment with Dr.Chris Lucia Gaskins.

## 2015-04-19 ENCOUNTER — Encounter: Payer: Self-pay | Admitting: Internal Medicine

## 2015-05-14 ENCOUNTER — Ambulatory Visit (INDEPENDENT_AMBULATORY_CARE_PROVIDER_SITE_OTHER): Payer: Medicare Other | Admitting: Cardiology

## 2015-05-14 ENCOUNTER — Encounter: Payer: Self-pay | Admitting: Cardiology

## 2015-05-14 VITALS — BP 117/71 | HR 59 | Ht 70.0 in | Wt 216.0 lb

## 2015-05-14 DIAGNOSIS — I25708 Atherosclerosis of coronary artery bypass graft(s), unspecified, with other forms of angina pectoris: Secondary | ICD-10-CM | POA: Diagnosis not present

## 2015-05-14 DIAGNOSIS — I5022 Chronic systolic (congestive) heart failure: Secondary | ICD-10-CM

## 2015-05-14 DIAGNOSIS — Z8249 Family history of ischemic heart disease and other diseases of the circulatory system: Secondary | ICD-10-CM

## 2015-05-14 DIAGNOSIS — I350 Nonrheumatic aortic (valve) stenosis: Secondary | ICD-10-CM

## 2015-05-14 DIAGNOSIS — I447 Left bundle-branch block, unspecified: Secondary | ICD-10-CM

## 2015-05-14 DIAGNOSIS — I493 Ventricular premature depolarization: Secondary | ICD-10-CM

## 2015-05-14 MED ORDER — PANTOPRAZOLE SODIUM 40 MG PO TBEC: 40.0000 mg | DELAYED_RELEASE_TABLET | Freq: Two times a day (BID) | ORAL | Status: DC

## 2015-05-14 MED ORDER — RAMIPRIL 5 MG PO CAPS: 5.0000 mg | ORAL_CAPSULE | Freq: Every day | ORAL | Status: DC

## 2015-05-14 MED ORDER — NITROGLYCERIN 0.4 MG SL SUBL: 0.4000 mg | SUBLINGUAL_TABLET | SUBLINGUAL | Status: DC | PRN

## 2015-05-14 MED ORDER — METOPROLOL SUCCINATE ER 50 MG PO TB24: 50.0000 mg | ORAL_TABLET | Freq: Every day | ORAL | Status: DC

## 2015-05-14 MED ORDER — ATORVASTATIN CALCIUM 80 MG PO TABS: ORAL_TABLET | ORAL | Status: DC

## 2015-05-14 MED ORDER — ISOSORBIDE MONONITRATE ER 60 MG PO TB24: 60.0000 mg | ORAL_TABLET | Freq: Every day | ORAL | Status: DC

## 2015-05-14 NOTE — Patient Instructions (Signed)
Continue your current therapy  We will see you in 6 months with an Echo.

## 2015-05-14 NOTE — Progress Notes (Signed)
[name] Date of Birth: 1945-02-20 Medical Record [id number]  History of Present Illness: [name] is seen for follow up CAD and syncope.  He has a known history of coronary disease and is status post CABG in 1996. This included an LIMA graft to the LAD and diagonal, sequential vein graft to the acute marginal, PDA, and posterior lateral branches of the right coronary, sequential saphenous vein graft to the obtuse marginal vessel and distal circumflex. He had a cardiac catheterization in 2007 which showed that his grafts were all patent. On 04/02/14 he had repeat cath for chest pain and grafts were again patent but there was a 90% stenosis in the SVG to OM2. This was stented with a DES.  Despite this intervention he has continued to have chest pain. He had cholecystectomy in January without complications and without relief of his pain. CXR was unremarkable. CT of the chest was negative for PE or aortic pathology. He had EGD by Dr. Oletta Lamas that showed Barrett's esophagus but no evidence of esophagitis. He is on Protonix.  In Dec 2016  he was admitted for evaluation of syncope. Echo showed EF 30-35% with moderate to severe AS. Mean gradient 24 mm Hg. AVA 0.8-0.9. There was concern that AVA was underestimated due to low output. EEG was negative. He had a LBBB. He underwent placement of a BIV-ICD by Dr. Lovena Le.  Subsequently he did complain of persistent dizziness. ICD showed no arrhythmia. He was seen by ENT for vertigo. Symptoms improved with vestibular exercises.  On follow up today he is feeling well. No recurrent dizziness or syncope. No palpitations. No dyspnea. He still has his chronic chest pain but states he is using only a tenth of the Ntg that he was previously. He is active walking at the gym and doing yard work.  Current Outpatient Prescriptions on File Prior to Visit  Medication Sig Dispense Refill  . Ascorbic Acid (VITAMIN C PO) Take 1,000 mg by mouth daily.     Marland Kitchen aspirin EC 81 MG EC  tablet Take 1 tablet (81 mg total) by mouth daily.    . Cholecalciferol (VITAMIN D PO) Take 5,000 Units by mouth daily.     . finasteride (PROSCAR) 5 MG tablet Take 1 tablet (5 mg total) by mouth daily. 90 tablet 3  . oxybutynin (DITROPAN) 5 MG tablet Take 5 mg by mouth daily.     . valACYclovir (VALTREX) 500 MG tablet Take 500 mg by mouth daily as needed (outbreak).      No current facility-administered medications on file prior to visit.    No Known Allergies  Past Medical History  Diagnosis Date  . Coronary artery disease     a. CABG 1996: LIMA graft to the LAD and diagonal, sequential vein graft to the acute marginal, PDA, and posterior lateral branches of the right coronary, sequential saphenous vein graft to the obtuse marginal vessel and distal circumflex. b. Cath 2007 patent grafts. c. s/p DES to SVG-OM2 in 03/2014.  Marland Kitchen History of obesity   . Hypercholesterolemia   . History of hiatal hernia   . History of kidney stones   . History of erectile dysfunction   . Gastroesophageal reflux disease   . History of prostatitis   . Hypertension   . Kidney stones   . OSA (obstructive sleep apnea)     "suppose to wear mask; I don't" (03/13/2014)  . Aortic stenosis, moderate   . Barrett's esophageal ulceration   . LBBB (left bundle  branch block)   . PVC's (premature ventricular contractions)     Past Surgical History  Procedure Laterality Date  . Cholecystectomy N/A 01/23/2014    Procedure: LAPAROSCOPIC CHOLECYSTECTOMY WITH INTRAOPERATIVE CHOLANGIOGRAM ;  Surgeon: Fanny Skates, MD;  Location: [facility];  Service: General;  Laterality: N/A;  . Coronary artery bypass graft  1996    "CABG X 7"  . Cardiac catheterization  1996; ~ 2006    ; Ejection fraction is estimated at 45%  . Coronary angioplasty with stent placement  03/13/2014    "1"  . Tonsillectomy  ~ 1950  . Colonoscopy w/ polypectomy    . Cystoscopy with stent placement    . Left heart catheterization with coronary/graft angiogram  N/A 03/13/2014    Procedure: LEFT HEART CATHETERIZATION WITH Beatrix Fetters;  Surgeon: Lanyla Costello M [name], MD;  Location: [facility] CATH LAB;  Service: Cardiovascular;  Laterality: N/A;  . Ep implantable device N/A 12/17/2014    Procedure: BiV ICD Insertion CRT-D;  Surgeon: Evans Lance, MD;  Location: [place] CV LAB;  Service: Cardiovascular;  Laterality: N/A;    History  Smoking status  . Former Smoker -- 0.75 packs/day for 15 years  . Types: Cigarettes  . Quit date: 01/12/1997  Smokeless tobacco  . Never Used    Comment: 3/4 pack per day. smoked 15 years off and on    History  Alcohol Use  . 2.4 oz/week  . 0 Standard drinks or equivalent, 4 Cans of beer per week    Family History  Problem Relation Age of Onset  . Heart attack Father   . Cancer Sister     Uterine Cancer    Review of Systems: As noted in HPI.  All other systems were reviewed and are negative.  Physical Exam: BP 117/71 mmHg  Pulse 59  Ht 5\' 10"  (1.778 m)  Wt 97.977 kg (216 lb)  BMI 30.99 kg/m2 He is a pleasant white male in no acute distress.The patient is alert and oriented x 3.    The skin is warm and dry.   The HEENT exam is normal.  The carotids are 2+ without bruits.  There is no thyromegaly.  There is no JVD.  The lungs are clear.  The chest wall is non tender.  The heart exam reveals a regular rate with a normal S1 and S2.  There is a harsh [id number] systolic murmur in the RUSB. The PMI is not displaced.   ICD site is healing well.  Abdominal exam reveals good bowel sounds.  There is no guarding or rebound.  There is no hepatosplenomegaly or tenderness.  There are no masses.  Exam of the legs reveal no clubbing, cyanosis, or edema.  The legs are without rashes.  The distal pulses are intact.  Cranial nerves II - XII are intact.  Motor and sensory functions are intact.  The gait is normal.  LABORATORY DATA: Echo 12/16/14:Study Conclusions Study Conclusions  - Left ventricle: The cavity size was mildly  dilated. There was severe concentric hypertrophy. Systolic function was moderately to severely reduced. The estimated ejection fraction was in the range of 30% to 35%. There is akinesis of the apicalanterolateral and apical myocardium. Doppler parameters are consistent with abnormal left ventricular relaxation (grade 1 diastolic dysfunction). - Ventricular septum: Septal motion showed abnormal function and dyssynergy. - Aortic valve: Cusp separation was reduced. There was moderate to severe stenosis. Stenosis severity may be underestimated by reduced EF. Peak velocity (S): 366 cm/s. Mean gradient (  S): 24 mm Hg. Valve area (VTI): 0.94 cm^2. Valve area (Vmax): 0.84 cm^2. Valve area (Vmean): 0.92 cm^2. - Mitral valve: There was mild regurgitation. - Left atrium: The atrium was severely dilated.  Impressions:  - When compared to prior, EF remains reduced and aortic stenosis has advanced.  Echo: 03/18/15:Study Conclusions  - Left ventricle: The cavity size was normal. There was severe  concentric hypertrophy. Systolic function was moderately to  severely reduced. The estimated ejection fraction was in the  range of 30% to 35%. Akinesis of the apical and apical  atnerolateral myocardium. Severe hypokinesis of the basal to mid  anterolateral hypokinesis. Doppler parameters are consistent with  abnormal left ventricular relaxation (grade 1 diastolic  dysfunction). - Aortic valve: Severely thickened, severely calcified leaflets.  Valve mobility was restricted. There was moderate to severe  stenosis. Peak velocity (S): 325 cm/s. Mean gradient (S): 22 mm  Hg. - Mitral valve: Calcified annulus. Transvalvular velocity was  within the normal range. There was no evidence for stenosis.  There was no regurgitation. - Left atrium: The atrium was severely dilated. - Right ventricle: The cavity size was normal. Wall thickness was  normal. - Tricuspid valve:  There was trivial regurgitation. - Pulmonary arteries: Systolic pressure was within the normal  range. PA peak pressure: 29 mm Hg (S).  Lab Results  Component Value Date   WBC 9.2 12/15/2014   HGB 14.9 12/15/2014   HCT 43.6 12/15/2014   PLT 189 12/15/2014   GLUCOSE 91 03/12/2015   CHOL 150 03/12/2015   TRIG 105 03/12/2015   HDL 38* 03/12/2015   LDLCALC 91 03/12/2015   ALT 31 03/12/2015   AST 34 03/12/2015   NA 141 03/12/2015   K 4.3 03/12/2015   CL 104 03/12/2015   CREATININE 0.81 03/12/2015   BUN 20 03/12/2015   CO2 30 03/12/2015   TSH 1.572 12/16/2014   INR 1.07 03/06/2014    Assessment / Plan: 1. Coronary disease with remote coronary bypass surgery in 1996. Cardiac catheterization 2007 showed patent grafts. Myoview study in January 2015 showed no ischemia with lateral infarct. Cath in March 2016 showed high grade disease in SVG to OM. s/p successful PCI with DES. Off plavix now. Chest pain improved on follow up today.  2.  Moderate-severe aortic stenosis. Possibly underestimated due to low output. No change by Echo. EF unchanged post BiV ICD. No clear symptoms. Will repeat Echo in 6 months. If AS progresses or if EF drops will need to consider AVR with either TAVR or redo open heart depending on status of his CAD at the time.  4. Syncope- history is consistent with arrhythmogenic cause. Now s/p BiV ICD. Will monitor.  5. Obesity.   6. Hyperlipidemia.   7. LBBB  8. Vertigo resolved.

## 2015-06-02 ENCOUNTER — Other Ambulatory Visit: Payer: Self-pay | Admitting: Cardiology

## 2015-06-04 ENCOUNTER — Other Ambulatory Visit: Payer: Self-pay

## 2015-06-04 NOTE — Telephone Encounter (Signed)
Patient called requesting a refill on nitrostat rx was sent over for quantity 25 X 11 refills; I spoke with pharmacist and they changed the quantity to 100 with out any consent from the office and patient is requesting a refill because he is going out of town and wants to have extra medication on hand.    I called patient back to verify the quanaty to nitrostat and he stated he normally takes 40-50 tabs of nitro a month and that Dr [name] is aware of this. I do not feel comfortable refilling this medication with out consent; please advise

## 2015-06-04 NOTE — Telephone Encounter (Signed)
It is OK to refill Ntg #100 tabs. 11 refills. He does use this frequently and it is OK.   Peter [name] MD, [facility]

## 2015-06-05 MED ORDER — NITROGLYCERIN 0.4 MG SL SUBL: 0.4000 mg | SUBLINGUAL_TABLET | SUBLINGUAL | Status: DC | PRN

## 2015-06-05 NOTE — Addendum Note (Signed)
Addended by: Kathyrn Lass on: 06/05/2015 02:11 PM   Modules accepted: Orders

## 2015-06-05 NOTE — Telephone Encounter (Signed)
Returned call to patient spoke to wife. Ntg refill sent to pharmacy.

## 2015-06-25 ENCOUNTER — Encounter: Payer: Self-pay | Admitting: Internal Medicine

## 2015-06-25 ENCOUNTER — Ambulatory Visit (INDEPENDENT_AMBULATORY_CARE_PROVIDER_SITE_OTHER): Payer: Medicare Other | Admitting: Internal Medicine

## 2015-06-25 VITALS — BP 94/68 | HR 65 | Ht 70.0 in | Wt 214.6 lb

## 2015-06-25 DIAGNOSIS — I5022 Chronic systolic (congestive) heart failure: Secondary | ICD-10-CM

## 2015-06-25 NOTE — Patient Instructions (Signed)
Medication Instructions:  Your physician recommends that you continue on your current medications as directed. Please refer to the Current Medication list given to you today.   Labwork: None ordered   Testing/Procedures: None ordered   Follow-Up: Your physician wants you to follow-up in: 12 months with Dr Knox Saliva will receive a reminder letter in the mail two months in advance. If you don't receive a letter, please call our office to schedule the follow-up appointment.  Remote monitoring is used to monitor your  ICD from home. This monitoring reduces the number of office visits required to check your device to one time per year. It allows [place] to keep an eye on the functioning of your device to ensure it is working properly. You are scheduled for a device check from home on 09/24/15. You may send your transmission at any time that day. If you have a wireless device, the transmission will be sent automatically. After your physician reviews your transmission, you will receive a postcard with your next transmission date.     Any Other Special Instructions Will Be Listed Below (If Applicable).     If you need a refill on your cardiac medications before your next appointment, please call your pharmacy.

## 2015-06-25 NOTE — Progress Notes (Signed)
HPI [name] returns today for followup. He is a pleasant 70 yo man with an ICM, chronic systolic heart failure, and LBBB, s/p BIV ICD implant 9 months ago. He has no complaints today. No ICD shocks.  No Known Allergies   Current Outpatient Prescriptions  Medication Sig Dispense Refill  . Ascorbic Acid (VITAMIN C PO) Take 1,000 mg by mouth daily.     Marland Kitchen aspirin EC 81 MG EC tablet Take 1 tablet (81 mg total) by mouth daily.    Marland Kitchen atorvastatin (LIPITOR) 80 MG tablet TAKE 1 TABLET (80 MG TOTAL) BY MOUTH DAILY. 90 tablet 3  . Cholecalciferol (VITAMIN D PO) Take 5,000 Units by mouth daily.     . finasteride (PROSCAR) 5 MG tablet Take 1 tablet (5 mg total) by mouth daily. 90 tablet 3  . isosorbide mononitrate (IMDUR) 60 MG 24 hr tablet Take 1 tablet (60 mg total) by mouth daily. 90 tablet 3  . metoprolol succinate (TOPROL-XL) 50 MG 24 hr tablet Take 1 tablet (50 mg total) by mouth daily. Take with or immediately following a meal. 90 tablet 3  . nitroGLYCERIN (NITROSTAT) 0.4 MG SL tablet Place 1 tablet (0.4 mg total) under the tongue every 5 (five) minutes as needed for chest pain. 100 tablet 11  . oxybutynin (DITROPAN) 5 MG tablet Take 5 mg by mouth daily.     . pantoprazole (PROTONIX) 40 MG tablet Take 1 tablet by mouth daily.    . ramipril (ALTACE) 5 MG capsule Take 1 capsule (5 mg total) by mouth daily. 90 capsule 3  . valACYclovir (VALTREX) 500 MG tablet Take 500 mg by mouth daily as needed (outbreak).      No current facility-administered medications for this visit.     Past Medical History  Diagnosis Date  . Coronary artery disease     a. CABG 1996: LIMA graft to the LAD and diagonal, sequential vein graft to the acute marginal, PDA, and posterior lateral branches of the right coronary, sequential saphenous vein graft to the obtuse marginal vessel and distal circumflex. b. Cath 2007 patent grafts. c. s/p DES to SVG-OM2 in 03/2014.  Marland Kitchen History of obesity   .  Hypercholesterolemia   . History of hiatal hernia   . History of kidney stones   . History of erectile dysfunction   . Gastroesophageal reflux disease   . History of prostatitis   . Hypertension   . Kidney stones   . OSA (obstructive sleep apnea)     "suppose to wear mask; I don't" (03/13/2014)  . Aortic stenosis, moderate   . Barrett's esophageal ulceration   . LBBB (left bundle branch block)   . PVC's (premature ventricular contractions)     ROS:   All systems reviewed and negative except as noted in the HPI.   Past Surgical History  Procedure Laterality Date  . Cholecystectomy N/A 01/23/2014    Procedure: LAPAROSCOPIC CHOLECYSTECTOMY WITH INTRAOPERATIVE CHOLANGIOGRAM ;  Surgeon: Fanny Skates, MD;  Location: [place];  Service: General;  Laterality: N/A;  . Coronary artery bypass graft  1996    "CABG X 7"  . Cardiac catheterization  1996; ~ 2006    ; Ejection fraction is estimated at 45%  . Coronary angioplasty with stent placement  03/13/2014    "1"  . Tonsillectomy  ~ 1950  . Colonoscopy w/ polypectomy    . Cystoscopy with stent placement    . Left heart catheterization with coronary/graft angiogram N/A 03/13/2014  Procedure: LEFT HEART CATHETERIZATION WITH Beatrix Fetters;  Surgeon: [name] [name], MD;  Location: [facility] CATH LAB;  Service: Cardiovascular;  Laterality: N/A;  . Ep implantable device N/A 12/17/2014    Procedure: BiV ICD Insertion CRT-D;  Surgeon: Evans Lance, MD;  Location: [place] CV LAB;  Service: Cardiovascular;  Laterality: N/A;     Family History  Problem Relation Age of Onset  . Heart attack Father   . Cancer Sister     Uterine Cancer     Social History   Social History  . Marital Status: Married    Spouse Name: N/A  . Number of Children: 3  . Years of Education: N/A   Occupational History  . sales     retired   Social History Main Topics  . Smoking status: Former Smoker -- 0.75 packs/day for 15 years    Types: Cigarettes     Quit date: 01/12/1997  . Smokeless tobacco: Never Used     Comment: 3/4 pack per day. smoked 15 years off and on  . Alcohol Use: 2.4 oz/week    0 Standard drinks or equivalent, 4 Cans of beer per week  . Drug Use: No  . Sexual Activity: Yes   Other Topics Concern  . Not on file   Social History Narrative     BP 94/68 mmHg  Pulse 65  Ht 5\' 10"  (1.778 m)  Wt 214 lb 9.6 oz (97.342 kg)  BMI 30.79 kg/m2  Physical Exam:  Well appearing 70 yo man, NAD HEENT: Unremarkable Neck:  6 cm JVD, no thyromegally Lymphatics:  No adenopathy Back:  No CVA tenderness Lungs:  Clear with no wheezes HEART:  Regular rate rhythm, no murmurs, no rubs, no clicks Abd:  soft, positive bowel sounds, no organomegally, no rebound, no guarding Ext:  2 plus pulses, no edema, no cyanosis, no clubbing Skin:  No rashes no nodules Neuro:  CN II through XII intact, motor grossly intact   DEVICE  Normal device function.  See PaceArt for details.   Assess/Plan: 1. Chronic systolic heart failure - his symptoms are class 2A. He seems improved. He will continue his current meds. 2. Dizziness - this is much improved. 3. ICD - he is s/p BiV ICD Imlant. His device is working normally. 4. CAD - he denies anginal symptoms.   [name], M.D.

## 2015-08-08 NOTE — Interval H&P Note (Signed)
History and Physical Interval Note: Of note, the patient has an ischemic cardiomyopathy and is s/p remote CABG and was noted to have an old lateral wall MI by perfusion stress imaging in June 2015. He appears to have sustained a silent MI as he has never had a clinic STEMI.  08/08/2015 1:41 PM  [name]  has presented today for surgery, with the diagnosis of syncope, CM  The various methods of treatment have been discussed with the patient and family. After consideration of risks, benefits and other options for treatment, the patient has consented to  Procedure(s): BiV ICD Insertion CRT-D (N/A) as a surgical intervention .  The patient's history has been reviewed, patient examined, no change in status, stable for surgery.  I have reviewed the patient's chart and labs.  Questions were answered to the patient's satisfaction.     Mikle Bosworth.D.

## 2015-09-24 ENCOUNTER — Ambulatory Visit (INDEPENDENT_AMBULATORY_CARE_PROVIDER_SITE_OTHER): Payer: Medicare Other | Admitting: *Deleted

## 2015-09-24 ENCOUNTER — Telehealth: Payer: Self-pay | Admitting: Cardiology

## 2015-09-24 DIAGNOSIS — I5022 Chronic systolic (congestive) heart failure: Secondary | ICD-10-CM

## 2015-09-24 DIAGNOSIS — Z95810 Presence of automatic (implantable) cardiac defibrillator: Secondary | ICD-10-CM

## 2015-09-24 NOTE — Telephone Encounter (Signed)
Confirmed remote transmission w/ pt wife.   

## 2015-09-25 NOTE — Progress Notes (Signed)
Remote ICD transmission.   

## 2015-09-26 ENCOUNTER — Encounter: Payer: Self-pay | Admitting: Cardiology

## 2015-09-30 ENCOUNTER — Ambulatory Visit (HOSPITAL_COMMUNITY): Payer: Medicare Other

## 2015-09-30 ENCOUNTER — Other Ambulatory Visit (HOSPITAL_COMMUNITY): Payer: Medicare Other

## 2015-10-01 ENCOUNTER — Other Ambulatory Visit: Payer: Self-pay

## 2015-10-01 DIAGNOSIS — I5022 Chronic systolic (congestive) heart failure: Secondary | ICD-10-CM

## 2015-10-01 MED ORDER — PANTOPRAZOLE SODIUM 40 MG PO TBEC: 40.0000 mg | DELAYED_RELEASE_TABLET | Freq: Every day | ORAL | 1 refills | Status: DC

## 2015-10-03 LAB — CUP PACEART REMOTE DEVICE CHECK
Battery Remaining Longevity: 88 mo
Battery Voltage: 3 V
Brady Statistic AP VP Percent: 39.11 %
Brady Statistic AS VS Percent: 2.39 %
HIGH POWER IMPEDANCE MEASURED VALUE: 72 Ohm
Implantable Lead Implant Date: 20161205
Implantable Lead Location: 753859
Implantable Lead Model: 4598
Lead Channel Impedance Value: 361 Ohm
Lead Channel Impedance Value: 361 Ohm
Lead Channel Impedance Value: 418 Ohm
Lead Channel Impedance Value: 418 Ohm
Lead Channel Impedance Value: 456 Ohm
Lead Channel Impedance Value: 456 Ohm
Lead Channel Impedance Value: 589 Ohm
Lead Channel Impedance Value: 665 Ohm
Lead Channel Impedance Value: 665 Ohm
Lead Channel Impedance Value: 703 Ohm
Lead Channel Pacing Threshold Amplitude: 0.625 V
Lead Channel Pacing Threshold Amplitude: 0.75 V
Lead Channel Pacing Threshold Pulse Width: 0.4 ms
Lead Channel Pacing Threshold Pulse Width: 0.4 ms
Lead Channel Sensing Intrinsic Amplitude: 1.75 mV
Lead Channel Sensing Intrinsic Amplitude: 18.125 mV
Lead Channel Setting Pacing Amplitude: 2.5 V
Lead Channel Setting Pacing Pulse Width: 0.4 ms
Lead Channel Setting Pacing Pulse Width: 0.4 ms
Lead Channel Setting Sensing Sensitivity: 0.3 mV
MDC IDC LEAD IMPLANT DT: 20161205
MDC IDC LEAD IMPLANT DT: 20161205
MDC IDC LEAD LOCATION: 753858
MDC IDC LEAD LOCATION: 753860
MDC IDC LEAD MODEL: 6935
MDC IDC MSMT LEADCHNL LV IMPEDANCE VALUE: 342 Ohm
MDC IDC MSMT LEADCHNL LV IMPEDANCE VALUE: 361 Ohm
MDC IDC MSMT LEADCHNL LV IMPEDANCE VALUE: 608 Ohm
MDC IDC MSMT LEADCHNL LV PACING THRESHOLD PULSEWIDTH: 0.4 ms
MDC IDC MSMT LEADCHNL RA SENSING INTR AMPL: 1.75 mV
MDC IDC MSMT LEADCHNL RV PACING THRESHOLD AMPLITUDE: 0.625 V
MDC IDC MSMT LEADCHNL RV SENSING INTR AMPL: 18.125 mV
MDC IDC SESS DTM: 20170912200306
MDC IDC SET LEADCHNL LV PACING AMPLITUDE: 1.75 V
MDC IDC SET LEADCHNL RA PACING AMPLITUDE: 2 V
MDC IDC STAT BRADY AP VS PERCENT: 0.21 %
MDC IDC STAT BRADY AS VP PERCENT: 58.29 %
MDC IDC STAT BRADY RA PERCENT PACED: 39.32 %
MDC IDC STAT BRADY RV PERCENT PACED: 91.73 %

## 2015-11-20 ENCOUNTER — Other Ambulatory Visit: Payer: Self-pay

## 2015-11-20 ENCOUNTER — Ambulatory Visit (HOSPITAL_COMMUNITY): Payer: Medicare Other | Attending: Cardiology

## 2015-11-20 DIAGNOSIS — Z8489 Family history of other specified conditions: Secondary | ICD-10-CM | POA: Diagnosis not present

## 2015-11-20 DIAGNOSIS — I447 Left bundle-branch block, unspecified: Secondary | ICD-10-CM | POA: Insufficient documentation

## 2015-11-20 DIAGNOSIS — Z951 Presence of aortocoronary bypass graft: Secondary | ICD-10-CM | POA: Diagnosis not present

## 2015-11-20 DIAGNOSIS — I25708 Atherosclerosis of coronary artery bypass graft(s), unspecified, with other forms of angina pectoris: Secondary | ICD-10-CM

## 2015-11-20 DIAGNOSIS — G4733 Obstructive sleep apnea (adult) (pediatric): Secondary | ICD-10-CM | POA: Insufficient documentation

## 2015-11-20 DIAGNOSIS — I493 Ventricular premature depolarization: Secondary | ICD-10-CM

## 2015-11-20 DIAGNOSIS — I5022 Chronic systolic (congestive) heart failure: Secondary | ICD-10-CM | POA: Diagnosis not present

## 2015-11-20 DIAGNOSIS — I350 Nonrheumatic aortic (valve) stenosis: Secondary | ICD-10-CM | POA: Diagnosis not present

## 2015-11-22 ENCOUNTER — Other Ambulatory Visit: Payer: Self-pay

## 2015-11-22 DIAGNOSIS — I350 Nonrheumatic aortic (valve) stenosis: Secondary | ICD-10-CM

## 2015-12-12 ENCOUNTER — Telehealth (HOSPITAL_COMMUNITY): Payer: Self-pay | Admitting: *Deleted

## 2015-12-12 NOTE — Telephone Encounter (Signed)
Left message on voicemail per DPR in reference to upcoming appointment scheduled on 12/19/15 at 7:30 with detailed instructions given per Myocardial Perfusion Study Information Sheet for the test. LM to arrive 15 minutes early, and that it is imperative to arrive on time for appointment to keep from having the test rescheduled. If you need to cancel or reschedule your appointment, please call the office within 24 hours of your appointment. Failure to do so may result in a cancellation of your appointment, and a $50 no show fee. Phone number given for call back for any questions.

## 2015-12-19 ENCOUNTER — Ambulatory Visit (HOSPITAL_COMMUNITY): Payer: Medicare Other | Attending: Cardiology

## 2015-12-19 ENCOUNTER — Ambulatory Visit (HOSPITAL_BASED_OUTPATIENT_CLINIC_OR_DEPARTMENT_OTHER): Payer: Medicare Other

## 2015-12-19 DIAGNOSIS — I350 Nonrheumatic aortic (valve) stenosis: Secondary | ICD-10-CM | POA: Diagnosis present

## 2015-12-19 DIAGNOSIS — I493 Ventricular premature depolarization: Secondary | ICD-10-CM | POA: Insufficient documentation

## 2015-12-19 DIAGNOSIS — R0989 Other specified symptoms and signs involving the circulatory and respiratory systems: Secondary | ICD-10-CM

## 2015-12-19 MED ORDER — DOBUTAMINE INFUSION FOR EP/ECHO/NUC (1000 MCG/ML)
0.0000 ug/kg/min | Freq: Once | INTRAVENOUS | Status: AC
  Administered 2015-12-19: 20 ug/kg/min via INTRAVENOUS

## 2015-12-23 ENCOUNTER — Telehealth: Payer: Self-pay | Admitting: Cardiology

## 2015-12-23 NOTE — Telephone Encounter (Signed)
Returned call to patient.Echo results given.Stated he feels ok not having any sob or chest pain.Advised to keep appointment as planned with Dr.Jordan 01/15/16 at 10:15 am.Advised to call sooner if he develops symptoms.

## 2015-12-23 NOTE — Telephone Encounter (Signed)
New message        Pt verbalized that he is calling because be is in need of his echo results

## 2015-12-24 ENCOUNTER — Ambulatory Visit (INDEPENDENT_AMBULATORY_CARE_PROVIDER_SITE_OTHER): Payer: Medicare Other | Admitting: Cardiology

## 2015-12-24 ENCOUNTER — Other Ambulatory Visit: Payer: Self-pay | Admitting: Cardiology

## 2015-12-24 ENCOUNTER — Ambulatory Visit (INDEPENDENT_AMBULATORY_CARE_PROVIDER_SITE_OTHER): Payer: Medicare Other | Admitting: *Deleted

## 2015-12-24 ENCOUNTER — Encounter: Payer: Self-pay | Admitting: Cardiology

## 2015-12-24 VITALS — BP 116/74 | HR 83 | Ht 70.0 in | Wt 215.0 lb

## 2015-12-24 DIAGNOSIS — I5022 Chronic systolic (congestive) heart failure: Secondary | ICD-10-CM | POA: Diagnosis not present

## 2015-12-24 DIAGNOSIS — I350 Nonrheumatic aortic (valve) stenosis: Secondary | ICD-10-CM

## 2015-12-24 DIAGNOSIS — I447 Left bundle-branch block, unspecified: Secondary | ICD-10-CM | POA: Diagnosis not present

## 2015-12-24 DIAGNOSIS — I25729 Atherosclerosis of autologous artery coronary artery bypass graft(s) with unspecified angina pectoris: Secondary | ICD-10-CM

## 2015-12-24 NOTE — Progress Notes (Signed)
Remote ICD transmission.   

## 2015-12-24 NOTE — Progress Notes (Signed)
[name] Date of Birth: September 12, 1945 Medical Record [id number]  History of Present Illness: [name] is seen for follow up CAD and aortic stenosis.  He has a known history of coronary disease and is status post CABG in 1996. This included an LIMA graft to the LAD and diagonal, sequential vein graft to the acute marginal, PDA, and posterior lateral branches of the right coronary, sequential saphenous vein graft to the obtuse marginal vessel and distal circumflex. He had a cardiac catheterization in 2007 which showed that his grafts were all patent. On 04/02/14 he had repeat cath for chest pain and grafts were again patent but there was a 90% stenosis in the SVG to OM2. This was stented with a DES.  Despite this intervention he has continued to have chest pain. He had cholecystectomy in January 0000000 without complications and without relief of his pain. CXR was unremarkable. CT of the chest was negative for PE or aortic pathology. He had EGD by Dr. Oletta Lamas that showed Barrett's esophagus but no evidence of esophagitis. He is on Protonix.   In Dec 2016  he was admitted for evaluation of syncope. Echo showed EF 30-35% with moderate to severe AS. Mean gradient 24 mm Hg. AVA 0.8-0.9. There was concern that AVA was underestimated due to low output. EEG was negative. He had a LBBB. He underwent placement of a BIV-ICD by Dr. Lovena Le.  Recently he had a repeat Echo showing some increase in AV gradient. He underwent a Dobutamine Echo. This showed improvement in EF to 40-45%. There was also increase in AV gradient with a mean gradient of 41 mm Hg and severe AS.  On follow up today he is feeling well. No recurrent dizziness or syncope. No palpitations. No dyspnea. He still has his chronic chest pain mostly at night relieved with sl Ntg.  He is active walking at the gym and building furniture.   Current Outpatient Prescriptions on File Prior to Visit  Medication Sig Dispense Refill  . Ascorbic Acid (VITAMIN C PO)  Take 1,000 mg by mouth daily.     Marland Kitchen aspirin EC 81 MG EC tablet Take 1 tablet (81 mg total) by mouth daily.    Marland Kitchen atorvastatin (LIPITOR) 80 MG tablet TAKE 1 TABLET (80 MG TOTAL) BY MOUTH DAILY. 90 tablet 3  . Cholecalciferol (VITAMIN D PO) Take 5,000 Units by mouth daily.     . finasteride (PROSCAR) 5 MG tablet Take 1 tablet (5 mg total) by mouth daily. 90 tablet 3  . isosorbide mononitrate (IMDUR) 60 MG 24 hr tablet Take 1 tablet (60 mg total) by mouth daily. 90 tablet 3  . metoprolol succinate (TOPROL-XL) 50 MG 24 hr tablet Take 1 tablet (50 mg total) by mouth daily. Take with or immediately following a meal. 90 tablet 3  . nitroGLYCERIN (NITROSTAT) 0.4 MG SL tablet Place 1 tablet (0.4 mg total) under the tongue every 5 (five) minutes as needed for chest pain. 100 tablet 11  . oxybutynin (DITROPAN) 5 MG tablet Take 5 mg by mouth daily.     . pantoprazole (PROTONIX) 40 MG tablet Take 1 tablet (40 mg total) by mouth daily. 90 tablet 1  . ramipril (ALTACE) 5 MG capsule Take 1 capsule (5 mg total) by mouth daily. 90 capsule 3  . valACYclovir (VALTREX) 500 MG tablet Take 500 mg by mouth daily as needed (outbreak).      No current facility-administered medications on file prior to visit.     No Known  Allergies  Past Medical History:  Diagnosis Date  . Aortic stenosis, moderate   . Barrett's esophageal ulceration   . Coronary artery disease    a. CABG 1996: LIMA graft to the LAD and diagonal, sequential vein graft to the acute marginal, PDA, and posterior lateral branches of the right coronary, sequential saphenous vein graft to the obtuse marginal vessel and distal circumflex. b. Cath 2007 patent grafts. c. s/p DES to SVG-OM2 in 03/2014.  Marland Kitchen Gastroesophageal reflux disease   . History of erectile dysfunction   . History of hiatal hernia   . History of kidney stones   . History of obesity   . History of prostatitis   . Hypercholesterolemia   . Hypertension   . Kidney stones   . LBBB (left  bundle branch block)   . OSA (obstructive sleep apnea)    "suppose to wear mask; I don't" (03/13/2014)  . PVC's (premature ventricular contractions)     Past Surgical History:  Procedure Laterality Date  . [place]; ~ 2006   ; Ejection fraction is estimated at 45%  . CHOLECYSTECTOMY N/A 01/23/2014   Procedure: LAPAROSCOPIC CHOLECYSTECTOMY WITH INTRAOPERATIVE CHOLANGIOGRAM ;  Surgeon: Fanny Skates, MD;  Location: [place];  Service: General;  Laterality: N/A;  . COLONOSCOPY W/ POLYPECTOMY    . CORONARY ANGIOPLASTY WITH STENT PLACEMENT  03/13/2014   "1"  . CORONARY ARTERY BYPASS GRAFT  1996   "CABG X 7"  . CYSTOSCOPY WITH STENT PLACEMENT    . EP IMPLANTABLE DEVICE N/A 12/17/2014   Procedure: BiV ICD Insertion CRT-D;  Surgeon: Evans Lance, MD;  Location: [place] [facility];  Service: Cardiovascular;  Laterality: N/A;  . LEFT HEART CATHETERIZATION WITH CORONARY/GRAFT ANGIOGRAM N/A 03/13/2014   Procedure: LEFT HEART CATHETERIZATION WITH Beatrix Fetters;  Surgeon: Falcon Mccaskey M [name], MD;  Location: [facility];  Service: Cardiovascular;  Laterality: N/A;  . TONSILLECTOMY  ~ 1950    History  Smoking Status  . Former Smoker  . Packs/day: 0.75  . Years: 15.00  . Types: Cigarettes  . Quit date: 01/12/1997  Smokeless Tobacco  . Never Used    Comment: 3/4 pack per day. smoked 15 years off and on    History  Alcohol Use  . 2.4 oz/week  . 4 Cans of beer per week    Family History  Problem Relation Age of Onset  . Heart attack Father   . Cancer Sister     Uterine Cancer    Review of Systems: As noted in HPI.  All other systems were reviewed and are negative.  Physical Exam: BP 116/74   Pulse 83   Ht 5\' 10"  (1.778 m)   Wt 215 lb (97.5 kg)   SpO2 98%   BMI 30.85 kg/m  He is a pleasant white male in no acute distress.The patient is alert and oriented x 3.    The skin is warm and dry.   The HEENT exam is normal.  The carotids are 2+ without bruits.  There  is no thyromegaly.  There is no JVD.  The lungs are clear.  The chest wall is non tender.  The heart exam reveals a regular rate with a normal S1 and S2.  There is a harsh 99991111 systolic murmur in the RUSB. The PMI is not displaced.   ICD site is healing well.  Abdominal exam reveals good bowel sounds.  There is no guarding or rebound.  There is no hepatosplenomegaly or tenderness.  There are no masses.  Exam of the legs reveal no clubbing, cyanosis, or edema.  The legs are without rashes.  The distal pulses are intact.  Cranial nerves II - XII are intact.  Motor and sensory functions are intact.  The gait is normal.  LABORATORY DATA:   Lab Results  Component Value Date   WBC 9.2 12/15/2014   HGB 14.9 12/15/2014   HCT 43.6 12/15/2014   PLT 189 12/15/2014   GLUCOSE 91 03/12/2015   CHOL 150 03/12/2015   TRIG 105 03/12/2015   HDL 38 (L) 03/12/2015   LDLCALC 91 03/12/2015   ALT 31 03/12/2015   AST 34 03/12/2015   NA 141 03/12/2015   K 4.3 03/12/2015   CL 104 03/12/2015   CREATININE 0.81 03/12/2015   BUN 20 03/12/2015   CO2 30 03/12/2015   TSH 1.572 12/16/2014   INR 1.07 03/06/2014   Echo dated 11/20/15: Study Conclusions  - Left ventricle: The cavity size was normal. Wall thickness was   increased in a pattern of mild LVH. Anterolateral hypokinesis,   inferolateral severe hypokinesis. Systolic function was   moderately reduced. The estimated ejection fraction was in the   range of 35% to 40%. Doppler parameters are consistent with   abnormal left ventricular relaxation (grade 1 diastolic   dysfunction). - Aortic valve: Trileaflet; severely calcified leaflets. Moderate   to severe aortic stenosis. Moderate by mean gradient, severe by   calculated valve area. Mean gradient (S): 31 mm Hg. Peak gradient   (S): 46 mm Hg. Valve area (VTI): 0.8 cm^2. - Mitral valve: There was trivial regurgitation. - Left atrium: The atrium was mildly dilated. - Right ventricle: The cavity size was  normal. Pacer wire or   catheter noted in right ventricle. Systolic function was mildly   reduced. - Tricuspid valve: Peak RV-RA gradient (S): 17 mm Hg. - Systemic veins: IVC not visualized.  Impressions:  - Normal LV size with mild LV hypertrophy. EF 35-40% with   anterolateral hypokinesis, inferolateral severe hypokinesis.   Normal RV size with mildly decreased systolic function. Moderate   to severe aortic stenosis. Moderate by mean gradient, severe by   calculated valve area. With low EF, possible low gradient severe   AS. Would consider low dose dobutamine stress echo.  Dobutamine Echo 12/19/15: Study Conclusions  - Baseline ECG: Normal sinus rhythm with Ventricular pacing. - Stress ECG conclusions: There were no stress arrhythmias or   conduction abnormalities. The stress ECG was non-diagnostic due   to paced rhythm. - Staged echo: There was no echocardiographic evidence for   stress-induced ischemia. - Impressions: Severe aortic stenosis at peak dobutamine infusion   with mean AV gradient of 39mmHg. Moderate LV dysfunction with   resting EF 34-40% with severe inferior, inferolateral and lateral   wall hypokinesis . After peak dobutamine infusion there the EF   increased slightly to 40-45% with persistence of severe   hypokinesis of the inferior, inferolateral and lateral walls.  Impressions:  - Severe aortic stenosis at peak dobutamine infusion with mean AV   gradient of 98mmHg. Moderate LV dysfunction with resting EF   34-40% with severe inferior, inferolateral and lateral wall   hypokinesis After peak dobutamine infusion there the EF increased   slightly to 40-45% with persistence of severe hypokinesis of the   inferior, inferolateral and lateral walls.  Assessment / Plan: 1. Coronary disease with remote coronary bypass surgery in 1996. Cath in March 2016 showed high grade disease in  SVG to OM. s/p successful PCI with DES. Other grafts patent. Native vessels all  occluded. No increase in angina. His chronic chest pain is unchanged.   2.  Severe aortic stenosis. Underestimated on Echo due to low output. Dobutamine Echo demonstrates severe AS with some LV reserve (improvement in EF) with Dobutamine.  We discussed the implications of these findings. Although he is minimally symptomatic I explained that his AS is having a negative impact on his LV function and this is likely to progress. Discussed that his LV function is the major determinant in his prognosis. Despite good medical therapy and BiV ICD LV function still depressed. Need to consider AV replacement. Recommend proceeding with [facility]. Scheduled for Jan 10. At that point will refer to Valve clinic to consider TAVR vs open repair. Patient states that he will not have open heart surgery again. The status of his bypass grafts will inform this decision.   4. Syncope-  Now s/p BiV ICD. Will monitor. No recurrence.   5. Obesity.   6. Hyperlipidemia.   7. LBBB

## 2015-12-24 NOTE — Patient Instructions (Signed)
We will schedule you for a right and left heart cath

## 2015-12-26 ENCOUNTER — Telehealth: Payer: Self-pay | Admitting: Cardiology

## 2015-12-26 NOTE — Telephone Encounter (Signed)
12/26/2015 Rcvd packet from [facility] for apt with  Dr. [name] on 02/10/2016. Gave packet to [employer]. mwc

## 2016-01-01 ENCOUNTER — Encounter: Payer: Self-pay | Admitting: Cardiology

## 2016-01-10 LAB — CUP PACEART REMOTE DEVICE CHECK
Battery Remaining Longevity: 84 mo
Brady Statistic AP VS Percent: 0.28 %
Brady Statistic AS VP Percent: 50.7 %
Brady Statistic AS VS Percent: 3.02 %
Brady Statistic RA Percent Paced: 41.89 %
Date Time Interrogation Session: 20171212051804
HighPow Impedance: 71 Ohm
Implantable Lead Implant Date: 20161205
Implantable Lead Implant Date: 20161205
Implantable Pulse Generator Implant Date: 20161205
Lead Channel Impedance Value: 342 Ohm
Lead Channel Impedance Value: 361 Ohm
Lead Channel Impedance Value: 418 Ohm
Lead Channel Impedance Value: 475 Ohm
Lead Channel Impedance Value: 589 Ohm
Lead Channel Impedance Value: 665 Ohm
Lead Channel Pacing Threshold Pulse Width: 0.4 ms
Lead Channel Sensing Intrinsic Amplitude: 20.125 mV
Lead Channel Sensing Intrinsic Amplitude: 20.125 mV
Lead Channel Setting Pacing Amplitude: 2 V
Lead Channel Setting Pacing Amplitude: 2.5 V
Lead Channel Setting Pacing Pulse Width: 0.4 ms
Lead Channel Setting Pacing Pulse Width: 0.4 ms
MDC IDC LEAD IMPLANT DT: 20161205
MDC IDC LEAD LOCATION: 753858
MDC IDC LEAD LOCATION: 753859
MDC IDC LEAD LOCATION: 753860
MDC IDC LEAD MODEL: 4598
MDC IDC LEAD MODEL: 6935
MDC IDC MSMT BATTERY VOLTAGE: 2.99 V
MDC IDC MSMT LEADCHNL LV IMPEDANCE VALUE: 361 Ohm
MDC IDC MSMT LEADCHNL LV IMPEDANCE VALUE: 399 Ohm
MDC IDC MSMT LEADCHNL LV IMPEDANCE VALUE: 418 Ohm
MDC IDC MSMT LEADCHNL LV IMPEDANCE VALUE: 608 Ohm
MDC IDC MSMT LEADCHNL LV IMPEDANCE VALUE: 703 Ohm
MDC IDC MSMT LEADCHNL LV IMPEDANCE VALUE: 722 Ohm
MDC IDC MSMT LEADCHNL LV PACING THRESHOLD AMPLITUDE: 0.875 V
MDC IDC MSMT LEADCHNL LV PACING THRESHOLD PULSEWIDTH: 0.4 ms
MDC IDC MSMT LEADCHNL RA IMPEDANCE VALUE: 418 Ohm
MDC IDC MSMT LEADCHNL RA PACING THRESHOLD AMPLITUDE: 0.625 V
MDC IDC MSMT LEADCHNL RA SENSING INTR AMPL: 1.625 mV
MDC IDC MSMT LEADCHNL RA SENSING INTR AMPL: 1.625 mV
MDC IDC MSMT LEADCHNL RV PACING THRESHOLD AMPLITUDE: 0.625 V
MDC IDC MSMT LEADCHNL RV PACING THRESHOLD PULSEWIDTH: 0.4 ms
MDC IDC SET LEADCHNL LV PACING AMPLITUDE: 2 V
MDC IDC SET LEADCHNL RV SENSING SENSITIVITY: 0.3 mV
MDC IDC STAT BRADY AP VP PERCENT: 46.01 %
MDC IDC STAT BRADY RV PERCENT PACED: 88.07 %

## 2016-01-15 ENCOUNTER — Ambulatory Visit: Payer: Medicare Other | Admitting: Cardiology

## 2016-01-15 ENCOUNTER — Ambulatory Visit
Admission: RE | Admit: 2016-01-15 | Discharge: 2016-01-15 | Disposition: A | Discharge status: Home / Self Care | Payer: Medicare Other | Source: Ambulatory Visit | Attending: Cardiology | Admitting: Cardiology

## 2016-01-15 DIAGNOSIS — I5022 Chronic systolic (congestive) heart failure: Secondary | ICD-10-CM

## 2016-01-15 DIAGNOSIS — I25729 Atherosclerosis of autologous artery coronary artery bypass graft(s) with unspecified angina pectoris: Secondary | ICD-10-CM

## 2016-01-15 DIAGNOSIS — I350 Nonrheumatic aortic (valve) stenosis: Secondary | ICD-10-CM

## 2016-01-15 DIAGNOSIS — I447 Left bundle-branch block, unspecified: Secondary | ICD-10-CM

## 2016-01-15 LAB — CBC WITH DIFFERENTIAL/PLATELET
BASOS ABS: 59 {cells}/uL (ref 0–200)
Basophils Relative: 1 %
EOS PCT: 4 %
Eosinophils Absolute: 236 cells/uL (ref 15–500)
HCT: 44 % (ref 38.5–50.0)
HEMOGLOBIN: 14.5 g/dL (ref 13.2–17.1)
LYMPHS ABS: 944 {cells}/uL (ref 850–3900)
LYMPHS PCT: 16 %
MCH: 30.6 pg (ref 27.0–33.0)
MCHC: 33 g/dL (ref 32.0–36.0)
MCV: 92.8 fL (ref 80.0–100.0)
MONOS PCT: 10 %
MPV: 9.9 fL (ref 7.5–12.5)
Monocytes Absolute: 590 cells/uL (ref 200–950)
NEUTROS PCT: 69 %
Neutro Abs: 4071 cells/uL (ref 1500–7800)
PLATELETS: 176 10*3/uL (ref 140–400)
RBC: 4.74 MIL/uL (ref 4.20–5.80)
RDW: 13.5 % (ref 11.0–15.0)
WBC: 5.9 10*3/uL (ref 3.8–10.8)

## 2016-01-15 LAB — PROTIME-INR
INR: 1
PROTHROMBIN TIME: 10.8 s (ref 9.0–11.5)

## 2016-01-16 LAB — BASIC METABOLIC PANEL
BUN: 23 mg/dL (ref 7–25)
CALCIUM: 9.2 mg/dL (ref 8.6–10.3)
CO2: 27 mmol/L (ref 20–31)
CREATININE: 0.68 mg/dL — AB (ref 0.70–1.18)
Chloride: 108 mmol/L (ref 98–110)
GLUCOSE: 96 mg/dL (ref 65–99)
Potassium: 4.5 mmol/L (ref 3.5–5.3)
SODIUM: 146 mmol/L (ref 135–146)

## 2016-01-22 ENCOUNTER — Ambulatory Visit (HOSPITAL_COMMUNITY)
Admission: RE | Admit: 2016-01-22 | Discharge: 2016-01-22 | Disposition: A | Discharge status: Home / Self Care | Payer: Medicare Other | Source: Ambulatory Visit | Attending: Cardiology | Admitting: Cardiology

## 2016-01-22 ENCOUNTER — Encounter (HOSPITAL_COMMUNITY)
Admission: RE | Disposition: A | Discharge status: Home / Self Care | Payer: Self-pay | Source: Ambulatory Visit | Attending: Cardiology

## 2016-01-22 DIAGNOSIS — G4733 Obstructive sleep apnea (adult) (pediatric): Secondary | ICD-10-CM | POA: Insufficient documentation

## 2016-01-22 DIAGNOSIS — Z955 Presence of coronary angioplasty implant and graft: Secondary | ICD-10-CM | POA: Insufficient documentation

## 2016-01-22 DIAGNOSIS — E669 Obesity, unspecified: Secondary | ICD-10-CM | POA: Insufficient documentation

## 2016-01-22 DIAGNOSIS — Z6830 Body mass index (BMI) 30.0-30.9, adult: Secondary | ICD-10-CM | POA: Diagnosis not present

## 2016-01-22 DIAGNOSIS — Z951 Presence of aortocoronary bypass graft: Secondary | ICD-10-CM | POA: Diagnosis not present

## 2016-01-22 DIAGNOSIS — K219 Gastro-esophageal reflux disease without esophagitis: Secondary | ICD-10-CM | POA: Insufficient documentation

## 2016-01-22 DIAGNOSIS — I447 Left bundle-branch block, unspecified: Secondary | ICD-10-CM | POA: Diagnosis present

## 2016-01-22 DIAGNOSIS — Z7982 Long term (current) use of aspirin: Secondary | ICD-10-CM | POA: Insufficient documentation

## 2016-01-22 DIAGNOSIS — E7800 Pure hypercholesterolemia, unspecified: Secondary | ICD-10-CM | POA: Diagnosis present

## 2016-01-22 DIAGNOSIS — I2510 Atherosclerotic heart disease of native coronary artery without angina pectoris: Secondary | ICD-10-CM | POA: Insufficient documentation

## 2016-01-22 DIAGNOSIS — G8929 Other chronic pain: Secondary | ICD-10-CM | POA: Diagnosis not present

## 2016-01-22 DIAGNOSIS — Z8249 Family history of ischemic heart disease and other diseases of the circulatory system: Secondary | ICD-10-CM

## 2016-01-22 DIAGNOSIS — I10 Essential (primary) hypertension: Secondary | ICD-10-CM | POA: Diagnosis not present

## 2016-01-22 DIAGNOSIS — Z8719 Personal history of other diseases of the digestive system: Secondary | ICD-10-CM | POA: Diagnosis not present

## 2016-01-22 DIAGNOSIS — E785 Hyperlipidemia, unspecified: Secondary | ICD-10-CM | POA: Diagnosis not present

## 2016-01-22 DIAGNOSIS — Z87891 Personal history of nicotine dependence: Secondary | ICD-10-CM | POA: Diagnosis not present

## 2016-01-22 DIAGNOSIS — I2582 Chronic total occlusion of coronary artery: Secondary | ICD-10-CM | POA: Diagnosis not present

## 2016-01-22 DIAGNOSIS — I350 Nonrheumatic aortic (valve) stenosis: Secondary | ICD-10-CM

## 2016-01-22 DIAGNOSIS — Z95810 Presence of automatic (implantable) cardiac defibrillator: Secondary | ICD-10-CM | POA: Insufficient documentation

## 2016-01-22 DIAGNOSIS — Z8049 Family history of malignant neoplasm of other genital organs: Secondary | ICD-10-CM | POA: Diagnosis not present

## 2016-01-22 DIAGNOSIS — Z87442 Personal history of urinary calculi: Secondary | ICD-10-CM | POA: Insufficient documentation

## 2016-01-22 HISTORY — PX: CARDIAC CATHETERIZATION: SHX172

## 2016-01-22 LAB — POCT I-STAT 3, ART BLOOD GAS (G3+)
Acid-Base Excess: 1 mmol/L (ref 0.0–2.0)
BICARBONATE: 26.3 mmol/L (ref 20.0–28.0)
O2 Saturation: 99 %
PH ART: 7.412 (ref 7.350–7.450)
TCO2: 28 mmol/L (ref 0–100)
pCO2 arterial: 41.3 mmHg (ref 32.0–48.0)
pO2, Arterial: 121 mmHg — ABNORMAL HIGH (ref 83.0–108.0)

## 2016-01-22 LAB — POCT I-STAT 3, VENOUS BLOOD GAS (G3P V)
Acid-Base Excess: 2 mmol/L (ref 0.0–2.0)
Bicarbonate: 28.7 mmol/L — ABNORMAL HIGH (ref 20.0–28.0)
O2 SAT: 67 %
PCO2 VEN: 50.1 mmHg (ref 44.0–60.0)
PO2 VEN: 37 mmHg (ref 32.0–45.0)
TCO2: 30 mmol/L (ref 0–100)
pH, Ven: 7.366 (ref 7.250–7.430)

## 2016-01-22 SURGERY — RIGHT/LEFT HEART CATH AND CORONARY/GRAFT ANGIOGRAPHY

## 2016-01-22 MED ORDER — MIDAZOLAM HCL 2 MG/2ML IJ SOLN
INTRAMUSCULAR | Status: AC
  Filled 2016-01-22: qty 2

## 2016-01-22 MED ORDER — SODIUM CHLORIDE 0.9 % IV SOLN: 250.0000 mL | INTRAVENOUS | Status: DC | PRN

## 2016-01-22 MED ORDER — MIDAZOLAM HCL 2 MG/2ML IJ SOLN
INTRAMUSCULAR | Status: DC | PRN
  Administered 2016-01-22: 2 mg via INTRAVENOUS

## 2016-01-22 MED ORDER — SODIUM CHLORIDE 0.9% FLUSH: 3.0000 mL | INTRAVENOUS | Status: DC | PRN

## 2016-01-22 MED ORDER — FENTANYL CITRATE (PF) 100 MCG/2ML IJ SOLN
INTRAMUSCULAR | Status: DC | PRN
  Administered 2016-01-22: 25 ug via INTRAVENOUS

## 2016-01-22 MED ORDER — LIDOCAINE HCL (PF) 1 % IJ SOLN
INTRAMUSCULAR | Status: AC
  Filled 2016-01-22: qty 30

## 2016-01-22 MED ORDER — HEPARIN (PORCINE) IN NACL 2-0.9 UNIT/ML-% IJ SOLN
INTRAMUSCULAR | Status: DC | PRN
Start: 2016-01-22 — End: 2016-01-22
  Administered 2016-01-22: 1000 mL

## 2016-01-22 MED ORDER — IOPAMIDOL (ISOVUE-370) INJECTION 76%
INTRAVENOUS | Status: AC
  Filled 2016-01-22: qty 125

## 2016-01-22 MED ORDER — SODIUM CHLORIDE 0.9 % IV SOLN: INTRAVENOUS | Status: DC

## 2016-01-22 MED ORDER — SODIUM CHLORIDE 0.9 % WEIGHT BASED INFUSION: 1.0000 mL/kg/h | INTRAVENOUS | Status: AC

## 2016-01-22 MED ORDER — HEPARIN (PORCINE) IN NACL 2-0.9 UNIT/ML-% IJ SOLN
INTRAMUSCULAR | Status: AC
  Filled 2016-01-22: qty 1000

## 2016-01-22 MED ORDER — SODIUM CHLORIDE 0.9% FLUSH: 3.0000 mL | Freq: Two times a day (BID) | INTRAVENOUS | Status: DC

## 2016-01-22 MED ORDER — IOPAMIDOL (ISOVUE-370) INJECTION 76%
INTRAVENOUS | Status: DC | PRN
  Administered 2016-01-22: 100 mL via INTRA_ARTERIAL

## 2016-01-22 MED ORDER — FENTANYL CITRATE (PF) 100 MCG/2ML IJ SOLN
INTRAMUSCULAR | Status: AC
  Filled 2016-01-22: qty 2

## 2016-01-22 MED ORDER — LIDOCAINE HCL (PF) 1 % IJ SOLN
INTRAMUSCULAR | Status: DC | PRN
  Administered 2016-01-22: 12 mL

## 2016-01-22 SURGICAL SUPPLY — 10 items
CATH INFINITI 5 FR MPA2 (CATHETERS) ×2 IMPLANT
CATH INFINITI 5FR MULTPACK ANG (CATHETERS) ×2 IMPLANT
CATH SWAN GANZ 7F STRAIGHT (CATHETERS) ×2 IMPLANT
KIT HEART LEFT (KITS) ×2 IMPLANT
PACK CARDIAC CATHETERIZATION (CUSTOM PROCEDURE TRAY) ×2 IMPLANT
SHEATH PINNACLE 5F 10CM (SHEATH) ×2 IMPLANT
SHEATH PINNACLE 7F 10CM (SHEATH) ×2 IMPLANT
TRANSDUCER W/STOPCOCK (MISCELLANEOUS) ×4 IMPLANT
TUBING CIL FLEX 10 FLL-RA (TUBING) ×2 IMPLANT
WIRE EMERALD 3MM-J .035X150CM (WIRE) ×2 IMPLANT

## 2016-01-22 NOTE — Discharge Instructions (Signed)

## 2016-01-22 NOTE — Progress Notes (Signed)
Site area: RFA Site Prior to Removal:  Level 0 Pressure Applied For:73min Manual:  yes  Patient Status During Pull:  stable Post Pull Site:  Level 0 Post Pull Instructions Given: yes  Post Pull Pulses Present: palpable Dressing Applied: tegaderm  Bedrest begins @ 1000 till 1400 Comments:

## 2016-01-22 NOTE — Interval H&P Note (Signed)
History and Physical Interval Note:  01/22/2016 8:18 AM  [name]  has presented today for surgery, with the diagnosis of severe as  The various methods of treatment have been discussed with the patient and family. After consideration of risks, benefits and other options for treatment, the patient has consented to  Procedure(s): Right/Left Heart Cath and Coronary Angiography (N/A) as a surgical intervention .  The patient's history has been reviewed, patient examined, no change in status, stable for surgery.  I have reviewed the patient's chart and labs.  Questions were answered to the patient's satisfaction.     [name] 01/22/2016 8:19 AM

## 2016-01-22 NOTE — H&P (View-Only) (Signed)
[name] Date of Birth: September 24, 1945 Medical Record [id number]  History of Present Illness: [name] is seen for follow up CAD and aortic stenosis.  He has a known history of coronary disease and is status post CABG in 1996. This included an LIMA graft to the LAD and diagonal, sequential vein graft to the acute marginal, PDA, and posterior lateral branches of the right coronary, sequential saphenous vein graft to the obtuse marginal vessel and distal circumflex. He had a cardiac catheterization in 2007 which showed that his grafts were all patent. On 04/02/14 he had repeat cath for chest pain and grafts were again patent but there was a 90% stenosis in the SVG to OM2. This was stented with a DES.  Despite this intervention he has continued to have chest pain. He had cholecystectomy in January 0000000 without complications and without relief of his pain. CXR was unremarkable. CT of the chest was negative for PE or aortic pathology. He had EGD by Dr. Oletta Barnes that showed Barrett's esophagus but no evidence of esophagitis. He is on Protonix.   In Dec 2016  he was admitted for evaluation of syncope. Echo showed EF 30-35% with moderate to severe AS. Mean gradient 24 mm Hg. AVA 0.8-0.9. There was concern that AVA was underestimated due to low output. EEG was negative. He had a LBBB. He underwent placement of a BIV-ICD by [name].  Recently he had a repeat Echo showing some increase in AV gradient. He underwent a Dobutamine Echo. This showed improvement in EF to 40-45%. There was also increase in AV gradient with a mean gradient of 41 mm Hg and severe AS.  On follow up today he is feeling well. No recurrent dizziness or syncope. No palpitations. No dyspnea. He still has his chronic chest pain mostly at night relieved with sl Ntg.  He is active walking at the gym and building furniture.   Current Outpatient Prescriptions on File Prior to Visit  Medication Sig Dispense Refill  . Ascorbic Acid (VITAMIN C PO)  Take 1,000 mg by mouth daily.     Marland Kitchen aspirin EC 81 MG EC tablet Take 1 tablet (81 mg total) by mouth daily.    Marland Kitchen atorvastatin (LIPITOR) 80 MG tablet TAKE 1 TABLET (80 MG TOTAL) BY MOUTH DAILY. 90 tablet 3  . Cholecalciferol (VITAMIN D PO) Take 5,000 Units by mouth daily.     . finasteride (PROSCAR) 5 MG tablet Take 1 tablet (5 mg total) by mouth daily. 90 tablet 3  . isosorbide mononitrate (IMDUR) 60 MG 24 hr tablet Take 1 tablet (60 mg total) by mouth daily. 90 tablet 3  . metoprolol succinate (TOPROL-XL) 50 MG 24 hr tablet Take 1 tablet (50 mg total) by mouth daily. Take with or immediately following a meal. 90 tablet 3  . nitroGLYCERIN (NITROSTAT) 0.4 MG SL tablet Place 1 tablet (0.4 mg total) under the tongue every 5 (five) minutes as needed for chest pain. 100 tablet 11  . oxybutynin (DITROPAN) 5 MG tablet Take 5 mg by mouth daily.     . pantoprazole (PROTONIX) 40 MG tablet Take 1 tablet (40 mg total) by mouth daily. 90 tablet 1  . ramipril (ALTACE) 5 MG capsule Take 1 capsule (5 mg total) by mouth daily. 90 capsule 3  . valACYclovir (VALTREX) 500 MG tablet Take 500 mg by mouth daily as needed (outbreak).      No current facility-administered medications on file prior to visit.     No Known  Allergies  Past Medical History:  Diagnosis Date  . Aortic stenosis, moderate   . Barrett's esophageal ulceration   . Coronary artery disease    a. CABG 1996: LIMA graft to the LAD and diagonal, sequential vein graft to the acute marginal, PDA, and posterior lateral branches of the right coronary, sequential saphenous vein graft to the obtuse marginal vessel and distal circumflex. b. Cath 2007 patent grafts. c. s/p DES to SVG-OM2 in 03/2014.  Marland Kitchen Gastroesophageal reflux disease   . History of erectile dysfunction   . History of hiatal hernia   . History of kidney stones   . History of obesity   . History of prostatitis   . Hypercholesterolemia   . Hypertension   . Kidney stones   . LBBB (left  bundle branch block)   . OSA (obstructive sleep apnea)    "suppose to wear mask; I don't" (03/13/2014)  . PVC's (premature ventricular contractions)     Past Surgical History:  Procedure Laterality Date  . [place]; ~ 2006   ; Ejection fraction is estimated at 45%  . CHOLECYSTECTOMY N/A 01/23/2014   Procedure: LAPAROSCOPIC CHOLECYSTECTOMY WITH INTRAOPERATIVE CHOLANGIOGRAM ;  Surgeon: Fanny Skates, MD;  Location: [place];  Service: General;  Laterality: N/A;  . COLONOSCOPY W/ POLYPECTOMY    . CORONARY ANGIOPLASTY WITH STENT PLACEMENT  03/13/2014   "1"  . CORONARY ARTERY BYPASS GRAFT  1996   "CABG X 7"  . CYSTOSCOPY WITH STENT PLACEMENT    . EP IMPLANTABLE DEVICE N/A 12/17/2014   Procedure: BiV ICD Insertion CRT-D;  Surgeon: Evans Lance, MD;  Location: [place] [facility];  Service: Cardiovascular;  Laterality: N/A;  . LEFT HEART CATHETERIZATION WITH CORONARY/GRAFT ANGIOGRAM N/A 03/13/2014   Procedure: LEFT HEART CATHETERIZATION WITH Beatrix Fetters;  Surgeon: Janiyah Beery M [name], MD;  Location: [facility] C[facility];  Service: Cardiovascular;  Laterality: N/A;  . TONSILLECTOMY  ~ 1950    History  Smoking Status  . Former Smoker  . Packs/day: 0.75  . Years: 15.00  . Types: Cigarettes  . Quit date: 01/12/1997  Smokeless Tobacco  . Never Used    Comment: 3/4 pack per day. smoked 15 years off and on    History  Alcohol Use  . 2.4 oz/week  . 4 Cans of beer per week    Family History  Problem Relation Age of Onset  . Heart attack Father   . Cancer Sister     Uterine Cancer    Review of Systems: As noted in HPI.  All other systems were reviewed and are negative.  Physical Exam: BP 116/74   Pulse 83   Ht 5\' 10"  (1.778 m)   Wt 215 lb (97.5 kg)   SpO2 98%   BMI 30.85 kg/m  He is a pleasant white male in no acute distress.The patient is alert and oriented x 3.    The skin is warm and dry.   The HEENT exam is normal.  The carotids are 2+ without bruits.  There  is no thyromegaly.  There is no JVD.  The lungs are clear.  The chest wall is non tender.  The heart exam reveals a regular rate with a normal S1 and S2.  There is a harsh 99991111 systolic murmur in the RUSB. The PMI is not displaced.   ICD site is healing well.  Abdominal exam reveals good bowel sounds.  There is no guarding or rebound.  There is no hepatosplenomegaly or tenderness.  There are no masses.  Exam of the legs reveal no clubbing, cyanosis, or edema.  The legs are without rashes.  The distal pulses are intact.  Cranial nerves II - XII are intact.  Motor and sensory functions are intact.  The gait is normal.  LABORATORY DATA:   Lab Results  Component Value Date   WBC 9.2 12/15/2014   HGB 14.9 12/15/2014   HCT 43.6 12/15/2014   PLT 189 12/15/2014   GLUCOSE 91 03/12/2015   CHOL 150 03/12/2015   TRIG 105 03/12/2015   HDL 38 (L) 03/12/2015   LDLCALC 91 03/12/2015   ALT 31 03/12/2015   AST 34 03/12/2015   NA 141 03/12/2015   K 4.3 03/12/2015   CL 104 03/12/2015   CREATININE 0.81 03/12/2015   BUN 20 03/12/2015   CO2 30 03/12/2015   TSH 1.572 12/16/2014   INR 1.07 03/06/2014   Echo dated 11/20/15: Study Conclusions  - Left ventricle: The cavity size was normal. Wall thickness was   increased in a pattern of mild LVH. Anterolateral hypokinesis,   inferolateral severe hypokinesis. Systolic function was   moderately reduced. The estimated ejection fraction was in the   range of 35% to 40%. Doppler parameters are consistent with   abnormal left ventricular relaxation (grade 1 diastolic   dysfunction). - Aortic valve: Trileaflet; severely calcified leaflets. Moderate   to severe aortic stenosis. Moderate by mean gradient, severe by   calculated valve area. Mean gradient (S): 31 mm Hg. Peak gradient   (S): 46 mm Hg. Valve area (VTI): 0.8 cm^2. - Mitral valve: There was trivial regurgitation. - Left atrium: The atrium was mildly dilated. - Right ventricle: The cavity size was  normal. Pacer wire or   catheter noted in right ventricle. Systolic function was mildly   reduced. - Tricuspid valve: Peak RV-RA gradient (S): 17 mm Hg. - Systemic veins: IVC not visualized.  Impressions:  - Normal LV size with mild LV hypertrophy. EF 35-40% with   anterolateral hypokinesis, inferolateral severe hypokinesis.   Normal RV size with mildly decreased systolic function. Moderate   to severe aortic stenosis. Moderate by mean gradient, severe by   calculated valve area. With low EF, possible low gradient severe   AS. Would consider low dose dobutamine stress echo.  Dobutamine Echo 12/19/15: Study Conclusions  - Baseline ECG: Normal sinus rhythm with Ventricular pacing. - Stress ECG conclusions: There were no stress arrhythmias or   conduction abnormalities. The stress ECG was non-diagnostic due   to paced rhythm. - Staged echo: There was no echocardiographic evidence for   stress-induced ischemia. - Impressions: Severe aortic stenosis at peak dobutamine infusion   with mean AV gradient of 72mmHg. Moderate LV dysfunction with   resting EF 34-40% with severe inferior, inferolateral and lateral   wall hypokinesis . After peak dobutamine infusion there the EF   increased slightly to 40-45% with persistence of severe   hypokinesis of the inferior, inferolateral and lateral walls.  Impressions:  - Severe aortic stenosis at peak dobutamine infusion with mean AV   gradient of 6mmHg. Moderate LV dysfunction with resting EF   34-40% with severe inferior, inferolateral and lateral wall   hypokinesis After peak dobutamine infusion there the EF increased   slightly to 40-45% with persistence of severe hypokinesis of the   inferior, inferolateral and lateral walls.  Assessment / Plan: 1. Coronary disease with remote coronary bypass surgery in 1996. Cath in March 2016 showed high grade disease in  SVG to OM. s/p successful PCI with DES. Other grafts patent. Native vessels all  occluded. No increase in angina. His chronic chest pain is unchanged.   2.  Severe aortic stenosis. Underestimated on Echo due to low output. Dobutamine Echo demonstrates severe AS with some LV reserve (improvement in EF) with Dobutamine.  We discussed the implications of these findings. Although he is minimally symptomatic I explained that his AS is having a negative impact on his LV function and this is likely to progress. Discussed that his LV function is the major determinant in his prognosis. Despite good medical therapy and BiV ICD LV function still depressed. Need to consider AV replacement. Recommend proceeding with [facility]. Scheduled for Jan 10. At that point will refer to Valve clinic to consider TAVR vs open repair. Patient states that he will not have open heart surgery again. The status of his bypass grafts will inform this decision.   4. Syncope-  Now s/p BiV ICD. Will monitor. No recurrence.   5. Obesity.   6. Hyperlipidemia.   7. LBBB

## 2016-01-22 NOTE — Progress Notes (Signed)
Right groin level 0 after abmulation tolerated well

## 2016-01-23 ENCOUNTER — Encounter (HOSPITAL_COMMUNITY): Payer: Self-pay | Admitting: Cardiology

## 2016-01-23 ENCOUNTER — Other Ambulatory Visit: Payer: Self-pay | Admitting: *Deleted

## 2016-01-23 DIAGNOSIS — I350 Nonrheumatic aortic (valve) stenosis: Secondary | ICD-10-CM

## 2016-01-30 ENCOUNTER — Ambulatory Visit (HOSPITAL_COMMUNITY): Payer: Medicare Other

## 2016-01-30 ENCOUNTER — Ambulatory Visit (HOSPITAL_COMMUNITY): Admission: RE | Admit: 2016-01-30 | Payer: Medicare Other | Source: Ambulatory Visit

## 2016-01-30 ENCOUNTER — Encounter (HOSPITAL_COMMUNITY): Payer: Medicare Other

## 2016-02-03 ENCOUNTER — Ambulatory Visit (HOSPITAL_COMMUNITY)
Admission: RE | Admit: 2016-02-03 | Discharge: 2016-02-03 | Disposition: A | Discharge status: Home / Self Care | Payer: Medicare Other | Source: Ambulatory Visit | Attending: Thoracic Surgery (Cardiothoracic Vascular Surgery) | Admitting: Thoracic Surgery (Cardiothoracic Vascular Surgery)

## 2016-02-03 ENCOUNTER — Encounter (HOSPITAL_COMMUNITY): Payer: Self-pay

## 2016-02-03 ENCOUNTER — Ambulatory Visit (HOSPITAL_COMMUNITY): Payer: Medicare Other

## 2016-02-03 DIAGNOSIS — I350 Nonrheumatic aortic (valve) stenosis: Secondary | ICD-10-CM

## 2016-02-03 DIAGNOSIS — I700 Atherosclerosis of aorta: Secondary | ICD-10-CM | POA: Diagnosis not present

## 2016-02-03 DIAGNOSIS — Z95810 Presence of automatic (implantable) cardiac defibrillator: Secondary | ICD-10-CM | POA: Diagnosis not present

## 2016-02-03 LAB — PULMONARY FUNCTION TEST
DL/VA % pred: 78 %
DL/VA: 3.58 ml/min/mmHg/L
DLCO UNC % PRED: 54 %
DLCO UNC: 16.83 ml/min/mmHg
FEF 25-75 PRE: 2.33 L/s
FEF 25-75 Post: 2.48 L/sec
FEF2575-%Change-Post: 6 %
FEF2575-%PRED-PRE: 103 %
FEF2575-%Pred-Post: 110 %
FEV1-%CHANGE-POST: 1 %
FEV1-%PRED-POST: 82 %
FEV1-%Pred-Pre: 81 %
FEV1-POST: 2.48 L
FEV1-Pre: 2.45 L
FEV1FVC-%Change-Post: 2 %
FEV1FVC-%Pred-Pre: 107 %
FEV6-%CHANGE-POST: -1 %
FEV6-%PRED-POST: 78 %
FEV6-%Pred-Pre: 79 %
FEV6-POST: 3.08 L
FEV6-PRE: 3.11 L
FEV6FVC-%PRED-POST: 106 %
FEV6FVC-%PRED-PRE: 106 %
FVC-%Change-Post: -1 %
FVC-%Pred-Post: 74 %
FVC-%Pred-Pre: 75 %
FVC-Post: 3.08 L
FVC-Pre: 3.11 L
POST FEV1/FVC RATIO: 81 %
PRE FEV6/FVC RATIO: 100 %
Post FEV6/FVC ratio: 100 %
Pre FEV1/FVC ratio: 79 %
RV % PRED: 100 %
RV: 2.47 L
TLC % PRED: 82 %
TLC: 5.63 L

## 2016-02-03 MED ORDER — ALBUTEROL SULFATE (2.5 MG/3ML) 0.083% IN NEBU
2.5000 mg | INHALATION_SOLUTION | Freq: Once | RESPIRATORY_TRACT | Status: AC
  Administered 2016-02-03: 2.5 mg via RESPIRATORY_TRACT

## 2016-02-03 MED ORDER — IOPAMIDOL (ISOVUE-370) INJECTION 76%
INTRAVENOUS | Status: AC
  Administered 2016-02-03: 150 mL
  Filled 2016-02-03: qty 100

## 2016-02-03 MED ORDER — IOPAMIDOL (ISOVUE-370) INJECTION 76%
INTRAVENOUS | Status: AC
  Filled 2016-02-03: qty 50

## 2016-02-04 ENCOUNTER — Institutional Professional Consult (permissible substitution) (INDEPENDENT_AMBULATORY_CARE_PROVIDER_SITE_OTHER): Payer: Medicare Other | Admitting: Thoracic Surgery (Cardiothoracic Vascular Surgery)

## 2016-02-04 ENCOUNTER — Encounter: Payer: Self-pay | Admitting: Thoracic Surgery (Cardiothoracic Vascular Surgery)

## 2016-02-04 ENCOUNTER — Other Ambulatory Visit: Payer: Self-pay | Admitting: *Deleted

## 2016-02-04 VITALS — BP 88/38 | HR 48 | Resp 20 | Ht 70.0 in | Wt 210.0 lb

## 2016-02-04 DIAGNOSIS — Z951 Presence of aortocoronary bypass graft: Secondary | ICD-10-CM | POA: Diagnosis not present

## 2016-02-04 DIAGNOSIS — N2889 Other specified disorders of kidney and ureter: Secondary | ICD-10-CM

## 2016-02-04 DIAGNOSIS — I350 Nonrheumatic aortic (valve) stenosis: Secondary | ICD-10-CM | POA: Diagnosis not present

## 2016-02-04 DIAGNOSIS — I25729 Atherosclerosis of autologous artery coronary artery bypass graft(s) with unspecified angina pectoris: Secondary | ICD-10-CM | POA: Diagnosis not present

## 2016-02-04 DIAGNOSIS — I25119 Atherosclerotic heart disease of native coronary artery with unspecified angina pectoris: Secondary | ICD-10-CM

## 2016-02-04 HISTORY — DX: Other specified disorders of kidney and ureter: N28.89

## 2016-02-04 NOTE — Progress Notes (Addendum)
HEART AND [facility]GERY CONSULTATION REPORT  Referring Provider is [name], [name], MD PCP is Pcp Not In System  Chief Complaint  Patient presents with  . Aortic Stenosis    Surgical eval for AVR v/s TAVR, review all studies    HPI:  Patient is a 71 year old male with a long history of coronary artery disease status post coronary artery bypass grafting the remote past, vein graft disease status post PCI and stenting, aortic stenosis, chronic combined systolic and diastolic congestive heart failure, GE reflux disease with Barrett's esophagus, hypertension, hyperlipidemia, and obstructive sleep apnea who has been referred for surgical consultation to discuss treatment options for management of severe symptomatic low gradient low ejection fraction aortic stenosis. The patient's cardiac history dates back to 1996 when he presented with symptoms of angina pectoris and was found to have severe three-vessel coronary artery disease. He underwent coronary artery bypass grafting 7 by Dr. Redmond Pulling with grafts placed including left internal mammary artery sewn sequentially to the diagonal and left anterior descending coronary artery, sequential saphenous vein graft to the obtuse marginal and distal left circumflex coronary artery, and sequential saphenous vein graft sewn to the acute marginal, posterior descending coronary artery and right posterolateral branch. The patient states that he recovered from his surgery uneventfully and did well for many years. He has been followed by Dr. [name] and underwent catheterization 2007 which showed that all of his grafts were patent. In 2016 he developed chest pain and repeat cath revealed continued patency of all the grafts but 90% stenosis in the terminal segment of the sequential saphenous vein graft to the circumflex system. This was stented with a drug-eluting stent. Ever since then the patient has  continued to have episodes of chest pain. Symptoms have been somewhat atypical. A time symptoms have been thought to be related to eating spicy foods. He underwent cholecystectomy in January 2017, but the patient's chest pain persisted.  He underwent EGD revealing Barrett's esophagus but no evidence of active esophagitis. He takes Protonix regularly. The patient has continued to have episodes of chest pain that occur both with activity and at rest. Symptoms are typically relieved by sublingual nitroglycerin. In December 2016 the patient had a syncopal episode. Echocardiogram performed at that time revealed ejection fraction estimated 30-35% with moderate to severe aortic stenosis. Mean transvalvular gradient was reported 24 mmHg with aortic valve area calculated 0.8-0.9. He was noted to have left bundle branch block. He underwent placement of a biventricular pacemaker ICD for CRT.  Symptoms have persisted. Recent follow-up transthoracic echocardiogram performed 11/20/2015 revealed somewhat increased gradient across the aortic valve with peak velocity across the aortic valve measured 3.4 Barnes/s corresponding to mean transvalvular gradient estimated between 27 and 31 mmHg.  The DVI was reported 0.23.  Left ventricular ejection fraction was estimated 35%. The patient subsequently underwent dobutamine stress echocardiography confirming the presence of low gradient low ejection fraction severe aortic stenosis with increased ejection fraction and increased transvalvular gradient estimated 41 mmHg at peak dobutamine infusion.  The patient subsequently underwent left and right heart catheterization on 01/22/2016. The patient had severe native coronary artery disease with 100% chronic occlusion of essentially all of the proximal coronary arteries. There remained continued patency of all the previously placed bypass grafts, including the stented segment of the vein graft to the left circumflex system. Hemodynamic findings at  catheterization revealed peak to peak and mean transvalvular gradient across the aortic valve measured 27 and  20.8 mmHg, respectively.  The aortic valve area was calculated 1.29 centimeters squared. Pulmonary artery pressures were normal.  The patient was referred for surgical consultation.  The patient is married and lives locally in [place] with his wife. He has been retired for approximately 10 years, having previously worked [employer] for many years. He has remained reasonably active during retirement. He does not exercise regularly but he keeps himself busy around the house doing a variety of chores. He describes a long history of episodes of substernal chest pain and chest tightness that can occur both with activity and at rest. Symptoms at times are worse at night. Symptoms are usually relieved by administration of sublingual nitroglycerin. The patient admits to mild exertional shortness of breath although he states that his breathing does not limit his physical activities to any significant degree. He has had some dizzy spells without syncope recently. He felt particular dizzy this morning but he feels as though he may be dehydrated as he has not had much of anything to drink all day. He denies any recent history of lower extremity edema, PND, or orthopnea.  Past Medical History:  Diagnosis Date  . Aortic stenosis   . Barrett's esophageal ulceration   . Chronic systolic CHF (congestive heart failure) ([place])   . Coronary artery disease    a. CABG 1996: LIMA graft to the LAD and diagonal, sequential vein graft to the acute marginal, PDA, and posterior lateral branches of the right coronary, sequential saphenous vein graft to the obtuse marginal vessel and distal circumflex. b. Cath 2007 patent grafts. c. s/p DES to SVG-OM2 in 03/2014.  Marland Kitchen Coronary artery disease involving autologous vein coronary bypass graft with angina pectoris ([place]) 03/13/2014   PCI using DES in SVG to LCx system  .  Gastroesophageal reflux disease   . History of erectile dysfunction   . History of hiatal hernia   . History of kidney stones   . History of obesity   . History of prostatitis   . Hypercholesterolemia   . Hypertension   . Kidney stones   . LBBB (left bundle branch block)   . OSA (obstructive sleep apnea)    "suppose to wear mask; I don't" (03/13/2014)  . PVC's (premature ventricular contractions)   . S/P CABG x 7 01/07/1995   LIMA to LAD-diagonal, SVG to OM-LCx, SVG to AM-PD-RPL    Past Surgical History:  Procedure Laterality Date  . [place]; ~ 2006   ; Ejection fraction is estimated at 45%  . CARDIAC CATHETERIZATION N/A 01/22/2016   Procedure: Right/Left Heart Cath and Coronary/Graft Angiography;  Surgeon: Peter Barnes [name], MD;  Location: [facility];  Service: Cardiovascular;  Laterality: N/A;  . CHOLECYSTECTOMY N/A 01/23/2014   Procedure: LAPAROSCOPIC CHOLECYSTECTOMY WITH INTRAOPERATIVE CHOLANGIOGRAM ;  Surgeon: Fanny Skates, MD;  Location: [facility];  Service: General;  Laterality: N/A;  . COLONOSCOPY W/ POLYPECTOMY    . CORONARY ANGIOPLASTY WITH STENT PLACEMENT  03/13/2014   "1"  . CORONARY ARTERY BYPASS GRAFT  1996   "CABG X 7"  . CYSTOSCOPY WITH STENT PLACEMENT    . EP IMPLANTABLE DEVICE N/A 12/17/2014   Procedure: BiV ICD Insertion CRT-D;  Surgeon: Evans Lance, MD;  Location: [facility];  Service: Cardiovascular;  Laterality: N/A;  . LEFT HEART CATHETERIZATION WITH CORONARY/GRAFT ANGIOGRAM N/A 03/13/2014   Procedure: LEFT HEART CATHETERIZATION WITH Beatrix Fetters;  Surgeon: Peter Barnes [name], MD;  Location: [facility];  Service:  Cardiovascular;  Laterality: N/A;  . TONSILLECTOMY  ~ 1950    Family History  Problem Relation Age of Onset  . Heart attack Father   . Cancer Sister     Uterine Cancer    Social History   Social History  . Marital status: Married    Spouse name: N/A  . Number of children: 3  . Years of education:  N/A   Occupational History  . sales Unemployed    retired   Social History Main Topics  . Smoking status: Former Smoker    Packs/day: 0.75    Years: 15.00    Types: Cigarettes    Quit date: 01/12/1997  . Smokeless tobacco: Never Used     Comment: 3/4 pack per day. smoked 15 years off and on  . Alcohol use 2.4 oz/week    4 Cans of beer per week  . Drug use: No  . Sexual activity: Yes   Other Topics Concern  . Not on file   Social History Narrative  . No narrative on file    Current Outpatient Prescriptions  Medication Sig Dispense Refill  . Ascorbic Acid (VITAMIN C PO) Take 1,000 mg by mouth daily.     Marland Kitchen aspirin EC 81 MG EC tablet Take 1 tablet (81 mg total) by mouth daily.    Marland Kitchen atorvastatin (LIPITOR) 80 MG tablet Take 80 mg by mouth daily.    . Cholecalciferol (VITAMIN D PO) Take 5,000 Units by mouth daily.     . finasteride (PROSCAR) 5 MG tablet Take 5 mg by mouth daily.    Marland Kitchen ibuprofen (ADVIL,MOTRIN) 200 MG tablet Take 600 mg by mouth every 6 (six) hours as needed for moderate pain.    . isosorbide mononitrate (IMDUR) 60 MG 24 hr tablet Take 1 tablet (60 mg total) by mouth daily. 90 tablet 3  . metoprolol succinate (TOPROL-XL) 50 MG 24 hr tablet Take 1 tablet (50 mg total) by mouth daily. Take with or immediately following a meal. 90 tablet 3  . nitroGLYCERIN (NITROSTAT) 0.4 MG SL tablet Place 1 tablet (0.4 mg total) under the tongue every 5 (five) minutes as needed for chest pain. 100 tablet 11  . oxybutynin (DITROPAN) 5 MG tablet Take 5 mg by mouth daily.     . pantoprazole (PROTONIX) 40 MG tablet Take 1 tablet (40 mg total) by mouth daily. 90 tablet 1  . ramipril (ALTACE) 5 MG capsule Take 1 capsule (5 mg total) by mouth daily. 90 capsule 3  . valACYclovir (VALTREX) 500 MG tablet Take 500 mg by mouth daily as needed (outbreak).      No current facility-administered medications for this visit.     No Known Allergies    Review of Systems:   General:  normal  appetite, decreased energy, no weight gain, no weight loss, no fever  Cardiac:  + chest pain with exertion, + chest pain at rest, + SOB with exertion, no resting SOB, no PND, no orthopnea, no palpitations, no arrhythmia, no atrial fibrillation, no LE edema, + dizzy spells, no syncope  Respiratory:  + shortness of breath, no home oxygen, no productive cough, no dry cough, no bronchitis, no wheezing, no hemoptysis, no asthma, no pain with inspiration or cough, + sleep apnea, no CPAP at night  GI:   no difficulty swallowing, + reflux, + frequent heartburn, + hiatal hernia, no abdominal pain, no constipation, no diarrhea, no hematochezia, no hematemesis, no melena  GU:   no dysuria,  no  frequency, no urinary tract infection, no hematuria, no enlarged prostate, no kidney stones, no kidney disease  Vascular:  no pain suggestive of claudication, no pain in feet, + occasional nocturnal leg cramps, no varicose veins, no DVT, no non-healing foot ulcer  Neuro:   no stroke, no TIA's, no seizures, no headaches, no temporary blindness one eye,  no slurred speech, no peripheral neuropathy, no chronic pain, no instability of gait, no memory/cognitive dysfunction  Musculoskeletal: + arthritis, no joint swelling, no myalgias, no difficulty walking, normal mobility   Skin:   no rash, no itching, no skin infections, no pressure sores or ulcerations  Psych:   no anxiety, no depression, no nervousness, no unusual recent stress  Eyes:   no blurry vision, no floaters, no recent vision changes, + wears glasses or contacts  ENT:   no hearing loss, no loose or painful teeth, no dentures, last saw dentist many years ago  Hematologic:  + easy bruising, no abnormal bleeding, no clotting disorder, no frequent epistaxis  Endocrine:  no diabetes, does not check CBG's at home           Physical Exam:   BP (!) 88/38   Pulse (!) 48   Resp 20   Ht 5\' 10"  (1.778 Barnes)   Wt 210 lb (95.3 kg)   SpO2 91% Comment: RA  BMI 30.13 kg/Barnes    General:  Mildly obese,  well-appearing  HEENT:  Unremarkable   Neck:   no JVD, no bruits, no adenopathy   Chest:   clear to auscultation, symmetrical breath sounds, no wheezes, no rhonchi   CV:   RRR, grade grade II-III/VI crescendo/decrescendo murmur heard best at RUSB,  no diastolic murmur  Abdomen:  soft, non-tender, no masses   Extremities:  warm, well-perfused, pulses diminished but palpable, no LE edema  Rectal/GU  Deferred  Neuro:   Grossly non-focal and symmetrical throughout  Skin:   Clean and dry, no rashes, no breakdown   Diagnostic Tests:  Transthoracic Echocardiography  Patient:    [name], [name] MR #:       [id number] Study Date: 11/20/2015 Gender:     Barnes Age:        70 Height:     177.8 cm Weight:     97.3 kg BSA:        2.22 Barnes^2 Pt. Status: Room:   ORDERING     Peter [name], Barnes.D.  REFERRING    Peter [name], Barnes.D.  SONOGRAPHER  Cindy Hazy, RDCS  ATTENDING    Ena Dawley, Barnes.D.  PERFORMING   [facility], Outpatient  cc:  ------------------------------------------------------------------- LV EF: 35% -   40%  ------------------------------------------------------------------- Indications:      I35.9 Aortic Valve Disorder.  ------------------------------------------------------------------- History:   PMH:  Acquired from the patient and from the patient&'s chart.  PMH:  Ischemic Cardiomyopathy. Chronic Systolic Heart Failure. Obstructive Sleep Apnea. LBBB. PVC&'s. CAD.  ------------------------------------------------------------------- Study Conclusions  - Left ventricle: The cavity size was normal. Wall thickness was   increased in a pattern of mild LVH. Anterolateral hypokinesis,   inferolateral severe hypokinesis. Systolic function was   moderately reduced. The estimated ejection fraction was in the   range of 35% to 40%. Doppler parameters are consistent with   abnormal left ventricular relaxation (grade 1 diastolic    dysfunction). - Aortic valve: Trileaflet; severely calcified leaflets. Moderate   to severe aortic stenosis. Moderate by mean gradient, severe by   calculated valve area. Mean gradient (S): 31 mm Hg.  Peak gradient   (S): 46 mm Hg. Valve area (VTI): 0.8 cm^2. - Mitral valve: There was trivial regurgitation. - Left atrium: The atrium was mildly dilated. - Right ventricle: The cavity size was normal. Pacer wire or   catheter noted in right ventricle. Systolic function was mildly   reduced. - Tricuspid valve: Peak RV-RA gradient (S): 17 mm Hg. - Systemic veins: IVC not visualized.  Impressions:  - Normal LV size with mild LV hypertrophy. EF 35-40% with   anterolateral hypokinesis, inferolateral severe hypokinesis.   Normal RV size with mildly decreased systolic function. Moderate   to severe aortic stenosis. Moderate by mean gradient, severe by   calculated valve area. With low EF, possible low gradient severe   AS. Would consider low dose dobutamine stress echo.  ------------------------------------------------------------------- Labs, prior tests, procedures, and surgery: ICD.  Coronary artery bypass grafting.  ------------------------------------------------------------------- Study data:   Study status:  Routine.  Procedure:  The patient reported no pain pre or post test. Transthoracic echocardiography for left ventricular function evaluation, for right ventricular function evaluation, and for assessment of valvular function. Image quality was adequate.  Study completion:  There were no complications.          Transthoracic echocardiography.  Barnes-mode, complete 2D, spectral Doppler, and color Doppler.  Birthdate: Patient birthdate: 08-Feb-1945.  Age:  Patient is 71 yr old.  Sex: Gender: male.    BMI: 30.8 kg/Barnes^2.  Blood pressure:     94/68 Patient status:  Outpatient.  Study date:  Study date: 11/20/2015. Study time: 07:54 AM.  Location:  [facility]  3  -------------------------------------------------------------------  ------------------------------------------------------------------- Left ventricle:  The cavity size was normal. Wall thickness was increased in a pattern of mild LVH. Anterolateral hypokinesis, inferolateral severe hypokinesis. Systolic function was moderately reduced. The estimated ejection fraction was in the range of 35% to 40%. Doppler parameters are consistent with abnormal left ventricular relaxation (grade 1 diastolic dysfunction).  ------------------------------------------------------------------- Aortic valve:   Trileaflet; severely calcified leaflets. Moderate to severe aortic stenosis. Moderate by mean gradient, severe by calculated valve area.  Doppler:  There was no regurgitation. VTI ratio of LVOT to aortic valve: 0.23. Valve area (VTI): 0.8 cm^2. Indexed valve area (VTI): 0.36 cm^2/Barnes^2. Peak velocity ratio of LVOT to aortic valve: 0.23. Valve area (Vmax): 0.81 cm^2. Indexed valve area (Vmax): 0.37 cm^2/Barnes^2. Mean velocity ratio of LVOT to aortic valve: 0.24. Valve area (Vmean): 0.82 cm^2. Indexed valve area (Vmean): 0.37 cm^2/Barnes^2.    Mean gradient (S): 31 mm Hg. Peak gradient (S): 46 mm Hg.  ------------------------------------------------------------------- Aorta:  Aortic root: The aortic root was normal in size. Ascending aorta: The ascending aorta was normal in size.  ------------------------------------------------------------------- Mitral valve:   Mildly calcified leaflets .  Doppler:   There was no evidence for stenosis.   There was trivial regurgitation.  ------------------------------------------------------------------- Left atrium:  The atrium was mildly dilated.  ------------------------------------------------------------------- Right ventricle:  The cavity size was normal. Pacer wire or catheter noted in right ventricle. Systolic function was  mildly reduced.  ------------------------------------------------------------------- Pulmonic valve:    Structurally normal valve.   Cusp separation was normal.  Doppler:  Transvalvular velocity was within the normal range. There was no regurgitation.  ------------------------------------------------------------------- Tricuspid valve:   Doppler:  There was trivial regurgitation.   ------------------------------------------------------------------- Right atrium:  The atrium was normal in size.  ------------------------------------------------------------------- Pericardium:  There was no pericardial effusion.  ------------------------------------------------------------------- Systemic veins:  IVC not visualized.  ------------------------------------------------------------------- Measurements   Left ventricle  Value          Reference  LV ID, ED, PLAX chordal           (H)     54.2  mm       43 - 52  LV ID, ES, PLAX chordal           (H)     39.3  mm       23 - 38  LV fx shortening, PLAX chordal    (L)     27    %        >=29  LV PW thickness, ED                       14.2  mm       ---------  IVS/LV PW ratio, ED                       0.95           <=1.3  Stroke volume, 2D                         66    ml       ---------  Stroke volume/bsa, 2D                     30    ml/Barnes^2   ---------  LV e&', lateral                            3.6   cm/s     ---------  LV E/e&', lateral                          12.81          ---------  LV e&', medial                             4.91  cm/s     ---------  LV E/e&', medial                           9.39           ---------  LV e&', average                            4.26  cm/s     ---------  LV E/e&', average                          10.83          ---------    Ventricular septum                        Value          Reference  IVS thickness, ED                         13.5  mm       ---------    LVOT  Value          Reference  LVOT ID, S                                21    mm       ---------  LVOT area                                 3.46  cm^2     ---------  LVOT ID                                   21    mm       ---------  LVOT peak velocity, S                     79    cm/s     ---------  LVOT mean velocity, S                     58.8  cm/s     ---------  LVOT VTI, S                               19    cm       ---------  LVOT peak gradient, S                     2     mm Hg    ---------  Stroke volume (SV), LVOT DP               65.8  ml       ---------  Stroke index (SV/bsa), LVOT DP            29.7  ml/Barnes^2   ---------    Aortic valve                              Value          Reference  Aortic valve peak velocity, S             338   cm/s     ---------  Aortic valve mean velocity, S             249   cm/s     ---------  Aortic valve VTI, S                       81.8  cm       ---------  Aortic mean gradient, S                   27    mm Hg    ---------  Aortic peak gradient, S                   46    mm Hg    ---------  VTI ratio, LVOT/AV                        0.23           ---------  Aortic valve area, VTI  0.8   cm^2     ---------  Aortic valve area/bsa, VTI                0.36  cm^2/Barnes^2 ---------  Velocity ratio, peak, LVOT/AV             0.23           ---------  Aortic valve area, peak velocity          0.81  cm^2     ---------  Aortic valve area/bsa, peak               0.37  cm^2/Barnes^2 ---------  velocity  Velocity ratio, mean, LVOT/AV             0.24           ---------  Aortic valve area, mean velocity          0.82  cm^2     ---------  Aortic valve area/bsa, mean               0.37  cm^2/Barnes^2 ---------  velocity    Aorta                                     Value          Reference  Aortic root ID, ED                        36    mm       ---------  Ascending aorta ID, A-P, S                33    mm       ---------     Left atrium                               Value          Reference  LA ID, A-P, ES                            56    mm       ---------  LA ID/bsa, A-P                    (H)     2.52  cm/Barnes^2   <=2.2  LA volume, S                              82    ml       ---------  LA volume/bsa, S                          37    ml/Barnes^2   ---------  LA volume, ES, 1-p A4C                    89    ml       ---------  LA volume/bsa, ES, 1-p A4C                40.1  ml/Barnes^2   ---------  LA volume, ES, 1-p A2C  75    ml       ---------  LA volume/bsa, ES, 1-p A2C                33.8  ml/Barnes^2   ---------    Mitral valve                              Value          Reference  Mitral E-wave peak velocity               46.1  cm/s     ---------  Mitral A-wave peak velocity               90.5  cm/s     ---------  Mitral deceleration time          (H)     356   ms       150 - 230  Mitral E/A ratio, peak                    0.5            ---------    Tricuspid valve                           Value          Reference  Tricuspid regurg peak velocity            206   cm/s     ---------  Tricuspid peak RV-RA gradient             17    mm Hg    ---------    Right ventricle                           Value          Reference  RV s&', lateral, S                         10.2  cm/s     ---------  Legend: (L)  and  (H)  mark values outside specified reference range.  ------------------------------------------------------------------- Prepared and Electronically Authenticated by  Loralie Champagne, Barnes.D. 2017-11-08T16:11:18   Stress Echocardiography  Patient:    [name], [name] MR #:       [id number] Study Date: 12/19/2015 Gender:     Barnes Age:        70 Height:     177.8 cm Weight:     97.3 kg BSA:        2.22 Barnes^2 Pt. Status: Room:   ATTENDING    Peter [name], Barnes.D.  ORDERING     Peter [name], Barnes.D.  REFERRING    Peter [name], Barnes.D.  SONOGRAPHER  Wyatt Mage, RDCS  PERFORMING   [facility],  Outpatient  cc:  -------------------------------------------------------------------  ------------------------------------------------------------------- Indications:      Aortic stenosis (I35).  ------------------------------------------------------------------- Study Conclusions  - Baseline ECG: Normal sinus rhythm with Ventricular pacing. - Stress ECG conclusions: There were no stress arrhythmias or   conduction abnormalities. The stress ECG was non-diagnostic due   to paced rhythm. - Staged echo: There was no echocardiographic evidence for   stress-induced ischemia. - Impressions: Severe aortic stenosis at peak dobutamine infusion   with mean AV gradient of 44mmHg. Moderate LV dysfunction with  resting EF 34-40% with severe inferior, inferolateral and lateral   wall hypokinesis . After peak dobutamine infusion there the EF   increased slightly to 40-45% with persistence of severe   hypokinesis of the inferior, inferolateral and lateral walls.  Impressions:  - Severe aortic stenosis at peak dobutamine infusion with mean AV   gradient of 67mmHg. Moderate LV dysfunction with resting EF   34-40% with severe inferior, inferolateral and lateral wall   hypokinesis After peak dobutamine infusion there the EF increased   slightly to 40-45% with persistence of severe hypokinesis of the   inferior, inferolateral and lateral walls.  ------------------------------------------------------------------- Study data:   Study status:  Routine.  Consent:  The risks, benefits, and alternatives to the procedure were explained to the patient and informed consent was obtained.  Procedure:  The patient reported no pain pre or post test. Initial setup. The patient was brought to the laboratory. A baseline ECG was recorded. Surface ECG leads and automatic cuff blood pressure measurements were monitored.  Dobutamine stress test. Stress testing was performed, with dobutamine infusion from 5  to 20 mcg/kg/min. The infusion was terminated due to maximal dose administration. Transthoracic stress echocardiography for assessment of valvular function. Images were captured at baseline, low dose, peak dose, and recovery.  Study completion:  The patient tolerated the procedure well. There were no complications.          Dobutamine. Stress echocardiography. 2D.  Birthdate:  Patient birthdate: [birth year].  Age:  Patient is 71 yr old.  Sex:  Gender: male.    BMI: 30.8 kg/Barnes^2.  Patient status:  Outpatient.  Study date:  Study date: 12/19/2015. Study time: 07:19 AM.  -------------------------------------------------------------------  ------------------------------------------------------------------- Aortic valve:   Doppler:     VTI ratio of LVOT to aortic valve: 0.3. Valve area (VTI): 0.95 cm^2. Indexed valve area (VTI): 0.43 cm^2/Barnes^2. Peak velocity ratio of LVOT to aortic valve: 0.29. Valve area (Vmax): 0.91 cm^2. Indexed valve area (Vmax): 0.41 cm^2/Barnes^2. Mean velocity ratio of LVOT to aortic valve: 0.26. Valve area (Vmean): 0.82 cm^2. Indexed valve area (Vmean): 0.37 cm^2/Barnes^2. Mean gradient (S): 27 mm Hg. Peak gradient (S): 51 mm Hg.   ------------------------------------------------------------------- Baseline ECG:  Normal sinus rhythm with Ventricular pacing. Normal.   ------------------------------------------------------------------- Stress protocol:  +-----------------------+---+------------+--------+ !Stage                  !HR !BP (mmHg)   !Symptoms! +-----------------------+---+------------+--------+ !Baseline               !83 !141/97 (112)!None    ! +-----------------------+---+------------+--------+ !Dobutamine 5 ug/kg/min !64 !------------!--------! +-----------------------+---+------------+--------+ !Dobutamine 10 ug/kg/min!59 !143/83 (103)!--------! +-----------------------+---+------------+--------+ !Dobutamine 20 ug/kg/min!114!132/75 (94)  !--------! +-----------------------+---+------------+--------+ !Immediate post stress  !114!------------!--------! +-----------------------+---+------------+--------+ !Recovery; 1 min        !120!137/77 (97) !--------! +-----------------------+---+------------+--------+ !Recovery; 2 min        !104!------------!--------! +-----------------------+---+------------+--------+ !Recovery; 3 min        !114!------------!--------! +-----------------------+---+------------+--------+ !Recovery; 4 min        !81 !158/91 (113)!--------! +-----------------------+---+------------+--------+ !Recovery; 5 min        !64 !------------!--------! +-----------------------+---+------------+--------+ !Recovery; 6 min        !69 !158/92 (114)!--------! +-----------------------+---+------------+--------+  ------------------------------------------------------------------- Stress results:   Maximal heart rate during stress was 120 bpm (81% of maximal predicted heart rate). The maximal predicted heart rate was 148 bpm.The target heart rate was achieved. The heart rate response to stress was normal. There was a normal resting blood pressure with an appropriate response to stress. Normal blood pressure  response to dobutamine. The rate-pressure product for the peak heart rate and blood pressure was 16440 mm Hg/min.  The patient experienced no chest pain during stress.  ------------------------------------------------------------------- Stress ECG:  There were no stress arrhythmias or conduction abnormalities. Isolated ventricular ectopy and ventricular couplets The stress ECG was non-diagnostic due to paced rhythm.  ------------------------------------------------------------------- Baseline:  - The estimated LV ejection fraction was 35-40%. - Hypokinesis of the lateral, inferolateral, and inferior LV   myocardium.  Aortic valve: Mean gradient: 33 mm Hg. Valve area: 0.58 cm^2. Low dose: Aortic valve:  Mean gradient: 29 mm Hg. Valve area: 0.8 cm^2. Peak stress:  - The estimated LV ejection fraction was 40-45%. - Hypokinesis of the lateral, inferolateral, and inferior LV   myocardium.  Aortic valve: Mean gradient: 41 mm Hg. Valve area: 1.23 cm^2. Recovery: Aortic valve: Mean gradient: 28 mm Hg. Valve area: 0.96 cm^2.  ------------------------------------------------------------------- Stress echo results:     There was no echocardiographic evidence for stress-induced ischemia.  ------------------------------------------------------------------- Measurements   Left ventricle                           Value  Stroke volume, 2D                        67    ml  Stroke volume/bsa, 2D                    30    ml/Barnes^2    LVOT                                     Value  LVOT ID, S                               20    mm  LVOT area                                3.14  cm^2  LVOT peak velocity, S                    104   cm/s  LVOT mean velocity, S                    62.1  cm/s  LVOT VTI, S                              21.3  cm  LVOT peak gradient, S                    4     mm Hg    Aortic valve                             Value  Aortic valve peak velocity, S            358   cm/s  Aortic valve mean velocity, S            239   cm/s  Aortic valve VTI, S  70.2  cm  Aortic mean gradient, S                  27    mm Hg  Aortic peak gradient, S                  51    mm Hg  VTI ratio, LVOT/AV                       0.3  Aortic valve area, VTI                   0.95  cm^2  Aortic valve area/bsa, VTI               0.43  cm^2/Barnes^2  Velocity ratio, peak, LVOT/AV            0.29  Aortic valve area, peak velocity         0.91  cm^2  Aortic valve area/bsa, peak velocity     0.41  cm^2/Barnes^2  Velocity ratio, mean, LVOT/AV            0.26  Aortic valve area, mean velocity         0.82  cm^2  Aortic valve area/bsa, mean velocity     0.37  cm^2/Barnes^2  Legend: (L)  and  (H)   mark values outside specified reference range.  ------------------------------------------------------------------- Prepared and Electronically Authenticated by  Fransico Him, MD 2017-12-08T15:43:14     Right/Left Heart Cath and Coronary/Graft Angiography  Conclusion     LV end diastolic pressure is normal.  There is moderate aortic valve stenosis.  Ost LM lesion, 100 %stenosed.  Prox Cx to Mid Cx lesion, 70 %stenosed.  Mid Cx lesion, 80 %stenosed.  Ost LAD to Prox LAD lesion, 100 %stenosed.  Ost RCA lesion, 99 %stenosed.  Prox RCA lesion, 100 %stenosed.  LIMA graft was visualized by angiography and is normal in caliber and anatomically normal.  Seq SVG- graft was visualized by angiography and is normal in caliber and anatomically normal.  1st Mrg-2 lesion, 90 %stenosed.  1st Mrg-1 lesion, 99 %stenosed.  Dist Graft lesion, 0 %stenosed.  Seq SVG- graft was visualized by angiography and is normal in caliber and anatomically normal.  Origin lesion, 30 %stenosed.  LV end diastolic pressure is normal.   1. Severe 3 vessel and left main occlusive CAD 2. Patent LIMA to the LAD and first diagonal 3. Patent SVG to OM1 and OM2. The first OM is tiny and diffusely diseased. The stent in the mid SVG is widely patent 4. Patent SVG to PDA and PLOM 5. Moderate Aortic stenosis by cath but Severe AS by Dobutamine stress Echo c/w low gradient severe AS 6. Normal right heart pressures. 7. Normal LV filling pressures 8. Normal cardiac output.  Plan: consider for AVR/TAVR.   Indications   Nonrheumatic aortic valve stenosis I35.0 (ICD-10-CM)  Procedural Details/Technique   Technical Details Indication: 71 yo WM with history of CAD s/p CABG in 1996, s/p stenting of SVG to OM in 2016 presents for evaluation of low gradient severe AS. EF 30-35% by Echo.  Procedural Details: The right groin was prepped, draped, and anesthetized with 1% lidocaine. Using the modified  Seldinger technique a 5 Fr sheath was placed in the right femoral artery and a 7 French sheath was placed in the right femoral vein. A Swan-Ganz catheter was used for the right heart catheterization. Standard protocol was followed for recording  of right heart pressures and sampling of oxygen saturations. Fick cardiac output was calculated. Standard Judkins catheters were used for selective coronary and graft angiography and left ventricular pressures. There were no immediate procedural complications. The patient was transferred to the post catheterization recovery area for further monitoring.  Contrast: 100 cc   Estimated blood loss <50 mL.  During this procedure the patient was administered the following to achieve and maintain moderate conscious sedation: Versed 2 mg, Fentanyl 25 mcg, while the patient's heart rate, blood pressure, and oxygen saturation were continuously monitored. The period of conscious sedation was 39 minutes, of which I was present face-to-face 100% of this time.    Complications   Complications documented before study signed (01/22/2016 9:34 AM EST)    No complications were associated with this study.  Documented by Peter Barnes [name], MD - 01/22/2016 9:33 AM EST    Coronary Findings   Dominance: Right  Left Main  Ost LM lesion, 100% stenosed.  Left Anterior Descending  Ost LAD to Prox LAD lesion, 100% stenosed.  Left Circumflex  Prox Cx to Mid Cx lesion, 70% stenosed.  Mid Cx lesion, 80% stenosed.  First Obtuse Marginal Branch  Vessel is small in size.  1st Mrg-1 lesion, 99% stenosed.  1st Mrg-2 lesion, 90% stenosed.  Right Coronary Artery  Ost RCA lesion, 99% stenosed.  Prox RCA lesion, 100% stenosed.  Graft Angiography  Free LIMA Graft to 1st Diag, Mid LAD  LIMA graft was visualized by angiography and is normal in caliber and anatomically normal.  Sequential jump graft Graft to 1st Mrg, 2nd Mrg  Seq SVG- graft was visualized by angiography and is normal in  caliber and anatomically normal.  Dist Graft lesion before 1st Mrg, 0% stenosed. Previously placed Dist Graft drug eluting stent before 1st Mrg is widely patent.  Sequential jump graft Graft to RPDA, Dist RCA  Seq SVG- graft was visualized by angiography and is normal in caliber and anatomically normal.  Origin lesion before RPDA, 30% stenosed.  Right Heart   Right Heart Pressures LV EDP is normal. Normal right heart pressures.    Left Heart   Left Ventricle LV end diastolic pressure is normal.    Aortic Valve There is moderate aortic valve stenosis. The aortic valve is calcified.    Coronary Diagrams   Diagnostic Diagram     Implants     No implant documentation for this case.  PACS Images   Show images for Cardiac catheterization   Link to Procedure Log   Procedure Log    Hemo Data   Flowsheet Row Most Recent Value  Fick Cardiac Output 4.4 L/min  Fick Cardiac Output Index 2.11 (L/min)/BSA  Aortic Mean Gradient 20.8 mmHg  Aortic Peak Gradient 27 mmHg  Aortic Valve Area 1.29  Aortic Value Area Index 0.62 cm2/BSA  RA A Wave 7 mmHg  RA V Wave 6 mmHg  RA Mean 4 mmHg  RV Systolic Pressure 31 mmHg  RV Diastolic Pressure 2 mmHg  RV EDP 7 mmHg  PA Systolic Pressure 30 mmHg  PA Diastolic Pressure 10 mmHg  PA Mean 18 mmHg  PW A Wave 19 mmHg  PW V Wave 20 mmHg  PW Mean 11 mmHg  AO Systolic Pressure 123456 mmHg  AO Diastolic Pressure 82 mmHg  AO Mean 0000000 mmHg  LV Systolic Pressure 0000000 mmHg  LV Diastolic Pressure 10 mmHg  LV EDP 28 mmHg  Arterial Occlusion Pressure Extended Systolic Pressure [id number] mmHg  Arterial Occlusion  Pressure Extended Diastolic Pressure 80 mmHg  Arterial Occlusion Pressure Extended Mean Pressure 112 mmHg  Left Ventricular Apex Extended Systolic Pressure 0000000 mmHg  Left Ventricular Apex Extended Diastolic Pressure 6 mmHg  Left Ventricular Apex Extended EDP Pressure 25 mmHg  QP/QS 1  TPVR Index 8.54 HRUI  TSVR Index 51.73 HRUI  PVR SVR Ratio 0.07   TPVR/TSVR Ratio 0.17    Cardiac TAVR CT  TECHNIQUE: The patient was scanned on a Philips 256 scanner. A 120 kV retrospective scan was triggered in the descending thoracic aorta at 111 HU's. Gantry rotation speed was 270 msecs and collimation was .9 mm. No beta blockade or nitro were given. The 3D data set was reconstructed in 5% intervals of the R-R cycle. Systolic and diastolic phases were analyzed on a dedicated work station using MPR, MIP and VRT modes. The patient received 80 cc of contrast.  FINDINGS: Aortic Valve:  Trileaflet and calcified with restricted motion.  Aorta: Moderate calcification of the STJ ascending root, arch and descending thoracic aorta. No aneurysm or heavy plaque. Normal take off of arch vessels  Sinotubular Junction:  25 mm  Ascending Thoracic Aorta:  34 mm  Aortic Arch:  25 mm  Descending Thoracic Aorta:  27 mm  Sinus of Valsalva Measurements:  Non-coronary:  33 mm  Right -coronary:  31 mm  Left -coronary:  31 mm  Coronary Artery Height above Annulus:  Left Main:  15 mm above annulus  Right Coronary:  12.5 mm above annulus  Virtual Basal Annulus Measurements:  Maximum/Minimum Diameter:  28.7 mm x 20.5 mm  Perimeter:  82 mm  Area:  480 cm2  Coronary Arteries: Sufficient height above annulus but native arteries known to be occluded. Patent SVG to RCA and OM, Patent LIMA to LAD  Optimum Fluoroscopic Angle for Delivery: LAO 22 degrees Cr 3 degrees  IMPRESSION: 1) Calcified trileaflet aortic valve with annulus suitable for a 26 mm Sapien 3 valve  2) Native coronaries sufficient height for delivery with patient SVG to RCA, OM and patent LIMA to LAD  3) Calcified ascending aorta, STJ and thoracic aorta with no aneurysm  4) Optimum angle for delivery LAO 22 degrees Cranial 3 degrees  5) Biventricular AICD wires in place  [employer]  Electronically Signed: By: Jenkins Rouge Barnes.D. On: 02/03/2016  14:00   STS Risk Calculator  Procedure    AVR + redo CABG  Risk of Mortality   4.1% Morbidity or Mortality  22.7% Prolonged LOS   9.9% Short LOS    28.2% Permanent Stroke   1.8% Prolonged Vent Support  16.8% DSW Infection    0.6% Renal Failure    5.2% Reoperation    10.0%    Impression:  Patient has stage D severe symptomatic low gradient low ejection fraction aortic stenosis and severe coronary artery disease status post coronary artery bypass grafting in the remote past. He presents with persistent symptoms of chest pain that are somewhat atypical, occur both with activity or at rest and possibly related to GE reflux disease, but are typically relieved with sublingual nitroglycerin, consistent with angina pectoris.  The patient also describes stable mild symptoms of exertional shortness of breath consistent with chronic combined systolic and diastolic heart failure, New York Heart Association functional class 1-2. I have personally reviewed the patient's recent transthoracic echocardiogram, dobutamine stress echocardiogram, diagnostic cardiac catheterization, and CT angiograms. Transthoracic echocardiogram reveals moderate left ventricular systolic dysfunction with ejection fraction estimated 35%. The aortic valve is trileaflet with  severe calcification, leaflet thickening, and decreased mobility involving all 3 leaflets of the aortic valve. Peak velocity measured across the aortic valve ranged as high as 3.4 Barnes/s but increased to greater than 4.0 Barnes/s during dobutamine stress echocardiography.  Diagnostic cardiac catheterization demonstrates severe native coronary artery disease with essentially 100% occlusion of all of the proximal native coronaries. There is continued patency of all of the patient's previously placed bypass grafts with exception of the first obtuse marginal branch of the left circumflex coronary artery. Catheterization is also notable for the presence of extensive  calcification in the aortic root and proximal ascending thoracic aorta.  I agree the patient would likely benefit from aortic valve replacement. Risks associated with conventional surgical aortic valve replacement with or without redo coronary artery bypass grafting would be at least moderately elevated, and the degree of calcification in the proximal ascending thoracic aorta would likely increase risks of surgery considerably.  Cardiac gated CT angiogram of the heart reveals findings consistent with severe aortic stenosis with anatomical characteristics suitable for transcatheter aortic valve replacement without any significant complicating features. CT angiogram of the aorta and iliac vessels was performed yesterday but has not yet been reviewed by a radiologist. The patient appears to have adequate pelvic vascular access to facilitate a transfemoral approach for transcatheter aortic valve replacement.   Plan:  The patient and his wife were counseled at length regarding treatment alternatives for management of severe symptomatic aortic stenosis. Alternative approaches such as conventional aortic valve replacement with or without redo coronary artery bypass grafting, transcatheter aortic valve replacement, and palliative medical therapy were compared and contrasted at length.  The risks associated with conventional surgical aortic valve replacement were been discussed in detail, as were expectations for post-operative convalescence. Long-term prognosis with medical therapy was discussed. This discussion was placed in the context of the patient's own specific clinical presentation and past medical history.  All of their questions have been addressed.  The patient is not interested in considering conventional surgery but is very interested in the possibility of proceeding with transcatheter aortic valve replacement in the near future.  Although he does not report any ongoing dental problems he admits that he  does not have good teeth and he has not seen a dentist in many, many years. He probably should have a dental examination performed prior to surgery.  We tentatively plan to proceed with transcatheter aortic valve replacement on Tuesday, 02/18/2016.  The patient has been instructed to make an effort to check his blood pressure at home if possible, particularly his dizzy spells persist. If so I have suggested that he may want to temporarily stop taking Imdur and notify Dr. [name] if his blood pressure remains low and/or he continues to have dizzy spells.  Following the decision to proceed with transcatheter aortic valve replacement, a discussion has been held regarding what types of management strategies would be attempted intraoperatively in the event of life-threatening complications, including whether or not the patient would be considered a candidate for the use of cardiopulmonary bypass and/or conversion to open sternotomy for attempted surgical intervention.  The patient has been advised of a variety of complications that might develop including but not limited to risks of death, stroke, paravalvular leak, aortic dissection or other major vascular complications, aortic annulus rupture, device embolization, cardiac rupture or perforation, mitral regurgitation, acute myocardial infarction, arrhythmia, heart block or bradycardia requiring permanent pacemaker placement, congestive heart failure, respiratory failure, renal failure, pneumonia, infection, other late complications related to  structural valve deterioration or migration, or other complications that might ultimately cause a temporary or permanent loss of functional independence or other long term morbidity.  The patient provides full informed consent for the procedure as described and all questions were answered.   I spent in excess of 90 minutes during the conduct of this office consultation and >50% of this time involved direct face-to-face  encounter with the patient for counseling and/or coordination of their care.    Valentina Gu. Roxy Manns, MD 02/04/2016 1:09 PM

## 2016-02-04 NOTE — Patient Instructions (Addendum)
Schedule dental consultation as soon as possible  Consider holding your isosorbide (Imdur) if you continue to have dizzy spells and/or your systolic blood pressure is < 100 mmHg - contact Dr. Doug Sou office if this occurs  Otherwise continue taking all other medications without change through the day before surgery.  Have nothing to eat or drink after midnight the night before surgery.  On the morning of surgery take only Toprol XL and Protonix with a sip of water.

## 2016-02-05 ENCOUNTER — Encounter (HOSPITAL_COMMUNITY): Payer: Self-pay | Admitting: Dentistry

## 2016-02-05 ENCOUNTER — Encounter: Payer: Medicare Other | Admitting: Thoracic Surgery (Cardiothoracic Vascular Surgery)

## 2016-02-05 ENCOUNTER — Ambulatory Visit (HOSPITAL_COMMUNITY): Payer: Self-pay | Admitting: Dentistry

## 2016-02-05 ENCOUNTER — Other Ambulatory Visit (HOSPITAL_COMMUNITY): Payer: Self-pay | Admitting: Dentistry

## 2016-02-05 VITALS — BP 134/55 | HR 74 | Temp 98.4°F

## 2016-02-05 DIAGNOSIS — K08409 Partial loss of teeth, unspecified cause, unspecified class: Secondary | ICD-10-CM

## 2016-02-05 DIAGNOSIS — K045 Chronic apical periodontitis: Secondary | ICD-10-CM | POA: Insufficient documentation

## 2016-02-05 DIAGNOSIS — I350 Nonrheumatic aortic (valve) stenosis: Secondary | ICD-10-CM

## 2016-02-05 DIAGNOSIS — K029 Dental caries, unspecified: Secondary | ICD-10-CM | POA: Insufficient documentation

## 2016-02-05 DIAGNOSIS — K0530 Chronic periodontitis, unspecified: Secondary | ICD-10-CM | POA: Insufficient documentation

## 2016-02-05 DIAGNOSIS — Z01818 Encounter for other preprocedural examination: Secondary | ICD-10-CM

## 2016-02-05 DIAGNOSIS — K030 Excessive attrition of teeth: Secondary | ICD-10-CM

## 2016-02-05 DIAGNOSIS — K06020 Generalized gingival recession, unspecified: Secondary | ICD-10-CM

## 2016-02-05 DIAGNOSIS — K083 Retained dental root: Secondary | ICD-10-CM | POA: Insufficient documentation

## 2016-02-05 DIAGNOSIS — M264 Malocclusion, unspecified: Secondary | ICD-10-CM

## 2016-02-05 DIAGNOSIS — K036 Deposits [accretions] on teeth: Secondary | ICD-10-CM | POA: Insufficient documentation

## 2016-02-05 NOTE — Patient Instructions (Signed)
[facility]    Department of Dental Medicine     DR. Kelsa Jaworowski      HEART VALVES AND MOUTH CARE:  FACTS:   If you have any infection in your mouth, it can infect your heart valve.  If you heart valve is infected, you will be seriously ill.  Infections in the mouth can be SILENT and do not always cause pain.  Examples of infections in the mouth are gum disease, dental cavities, and abscesses.  Some possible signs of infection are: Bad breath, bleeding gums, or teeth that are sensitive to sweets, hot, and/or cold. There are many other signs as well.  WHAT YOU HAVE TO DO:   Brush your teeth after meals and at bedtime. Spend at least 2 minutes brushing well, especially behind your back teeth and all around your teeth that stand alone. Brush at the gumline also.  Do not go to bed without brushing your teeth and flossing.  If you gums bleed when you brush or floss, do NOT stop brushing or flossing. It usually means that your gums need more attention and better cleaning.   If your Dentist or Dr. Soham Hollett gave you a prescription mouthwash to use, make sure to use it as directed. If you run out of the medication, get a refill at the pharmacy.   If you were given any other medications or directions by your Dentist, please follow them. If you did not understand the directions or forget what you were told, please call. We will be happy to refresh her memory.  If you need antibiotics before dental procedures, make sure you take them one hour prior to every dental visit as directed.   Get a dental checkup every 4-6 months in order to keep your mouth healthy, or to find and treat any new infection. You will most likely need your teeth cleaned or gums treated at the same time.  If you are not able to come in for your scheduled appointment, call your Dentist as soon as possible to reschedule.  If you have a problem in between dental visits, call your Dentist.  

## 2016-02-05 NOTE — Progress Notes (Signed)
DENTAL CONSULTATION  Date of Consultation:  02/05/2016 Patient Name:   [name] Date of Birth:   12/20/1945 Medical Record Number: [id number]  VITALS: BP (!) 134/55 (BP Location: Left Arm)   Pulse 74   Temp 98.4 F (36.9 C) (Oral)   CHIEF COMPLAINT: Patient referred by Dr. Roxy Manns for dental consultation.  HPI: [name] is a 71 year old male recently diagnosed with severe aortic stenosis. Patient with anticipated aortic valve replacement with Dr. Roxy Manns. Patient now seen as part of a pre-heart valve surgery dental protocol examination.  The patient currently denies acute toothaches, swellings, or abscesses. The patient last saw the dentist in approximately 2005. Patient had root canal therapy with an endodontist at that time. Patient denies having any primary dentist at this time. Patient denies having any partial dentures. Patient has dental phobia by report.  PROBLEM LIST: Patient Active Problem List   Diagnosis Date Noted  . Aortic stenosis     Priority: High  . Nodule of kidney 02/04/2016  . Coronary artery disease involving autologous artery coronary bypass graft with angina pectoris ([place])   . Coronary artery disease involving native heart with angina pectoris ([place])   . Syncope 12/24/2014  . Bilateral carotid bruits 12/16/2014  . Abdominal bruit 12/16/2014  . PVC's (premature ventricular contractions)   . Chronic systolic CHF (congestive heart failure) ([place])   . Syncope and collapse versus seizures 12/15/2014  . Chronic chest pain 12/15/2014  . Coronary artery disease involving coronary bypass graft of native heart with angina pectoris ([place]) 03/13/2014  . Angina pectoris ([place]) 03/13/2014  . Atypical chest pain 02/21/2014  . Gallstones 05/16/2013  . Barrett's esophagus 05/16/2013  . History of nephrolithiasis 05/16/2013  . LBBB (left bundle branch block) 02/11/2011  . Coronary artery disease   . Hypercholesterolemia   . History of hypertension   .  Gastroesophageal reflux disease   . History of obesity   . S/P CABG x 7 01/07/1995    PMH: Past Medical History:  Diagnosis Date  . Aortic stenosis   . Barrett's esophageal ulceration   . Chronic systolic CHF (congestive heart failure) ([place])   . Coronary artery disease    a. CABG 1996: LIMA graft to the LAD and diagonal, sequential vein graft to the acute marginal, PDA, and posterior lateral branches of the right coronary, sequential saphenous vein graft to the obtuse marginal vessel and distal circumflex. b. Cath 2007 patent grafts. c. s/p DES to SVG-OM2 in 03/2014.  Marland Kitchen Coronary artery disease involving autologous vein coronary bypass graft with angina pectoris ([place]) 03/13/2014   PCI using DES in SVG to LCx system  . Gastroesophageal reflux disease   . History of erectile dysfunction   . History of hiatal hernia   . History of kidney stones   . History of obesity   . History of prostatitis   . Hypercholesterolemia   . Hypertension   . Kidney stones   . LBBB (left bundle branch block)   . Nodule of kidney 02/04/2016   Incidental 34mm nodule upper pole right kidney noted on CT angiogram - MRI with and without gadolinium contrast recommened in 6 months  . OSA (obstructive sleep apnea)    "suppose to wear mask; I don't" (03/13/2014)  . PVC's (premature ventricular contractions)   . S/P CABG x 7 01/07/1995   LIMA to LAD-diagonal, SVG to OM-LCx, SVG to AM-PD-RPL    PSH: Past Surgical History:  Procedure Laterality Date  . [place]; ~  2006   ; Ejection fraction is estimated at 45%  . CARDIAC CATHETERIZATION N/A 01/22/2016   Procedure: Right/Left Heart Cath and Coronary/Graft Angiography;  Surgeon: [name] [name], MD;  Location: [facility];  Service: Cardiovascular;  Laterality: N/A;  . CHOLECYSTECTOMY N/A 01/23/2014   Procedure: LAPAROSCOPIC CHOLECYSTECTOMY WITH INTRAOPERATIVE CHOLANGIOGRAM ;  Surgeon: Fanny Skates, MD;  Location: [facility];  Service: General;   Laterality: N/A;  . COLONOSCOPY W/ POLYPECTOMY    . CORONARY ANGIOPLASTY WITH STENT PLACEMENT  03/13/2014   "1"  . CORONARY ARTERY BYPASS GRAFT  1996   "CABG X 7"  . CYSTOSCOPY WITH STENT PLACEMENT    . EP IMPLANTABLE DEVICE N/A 12/17/2014   Procedure: BiV ICD Insertion CRT-D;  Surgeon: Evans Lance, MD;  Location: [facility];  Service: Cardiovascular;  Laterality: N/A;  . LEFT HEART CATHETERIZATION WITH CORONARY/GRAFT ANGIOGRAM N/A 03/13/2014   Procedure: LEFT HEART CATHETERIZATION WITH Beatrix Fetters;  Surgeon: [name] [name], MD;  Location: [facility];  Service: Cardiovascular;  Laterality: N/A;  . TONSILLECTOMY  ~ 1950    ALLERGIES: No Known Allergies  MEDICATIONS: Current Outpatient Prescriptions  Medication Sig Dispense Refill  . Ascorbic Acid (VITAMIN C PO) Take 1,000 mg by mouth daily.     Marland Kitchen aspirin EC 81 MG EC tablet Take 1 tablet (81 mg total) by mouth daily.    Marland Kitchen atorvastatin (LIPITOR) 80 MG tablet Take 80 mg by mouth daily.    . Cholecalciferol (VITAMIN D PO) Take 5,000 Units by mouth daily.     . finasteride (PROSCAR) 5 MG tablet Take 5 mg by mouth daily.    Marland Kitchen ibuprofen (ADVIL,MOTRIN) 200 MG tablet Take 600 mg by mouth every 6 (six) hours as needed for moderate pain.    . isosorbide mononitrate (IMDUR) 60 MG 24 hr tablet Take 1 tablet (60 mg total) by mouth daily. 90 tablet 3  . metoprolol succinate (TOPROL-XL) 50 MG 24 hr tablet Take 1 tablet (50 mg total) by mouth daily. Take with or immediately following a meal. 90 tablet 3  . nitroGLYCERIN (NITROSTAT) 0.4 MG SL tablet Place 1 tablet (0.4 mg total) under the tongue every 5 (five) minutes as needed for chest pain. 100 tablet 11  . oxybutynin (DITROPAN) 5 MG tablet Take 5 mg by mouth daily.     . pantoprazole (PROTONIX) 40 MG tablet Take 1 tablet (40 mg total) by mouth daily. 90 tablet 1  . ramipril (ALTACE) 5 MG capsule Take 1 capsule (5 mg total) by mouth daily. 90 capsule 3  . valACYclovir (VALTREX)  500 MG tablet Take 500 mg by mouth daily as needed (outbreak).      No current facility-administered medications for this visit.     LABS: Lab Results  Component Value Date   WBC 5.9 01/15/2016   HGB 14.5 01/15/2016   HCT 44.0 01/15/2016   MCV 92.8 01/15/2016   PLT 176 01/15/2016      Component Value Date/Time   NA 146 01/15/2016 0939   K 4.5 01/15/2016 0939   CL 108 01/15/2016 0939   CO2 27 01/15/2016 0939   GLUCOSE 96 01/15/2016 0939   BUN 23 01/15/2016 0939   CREATININE 0.68 (L) 01/15/2016 0939   CALCIUM 9.2 01/15/2016 0939   GFRNONAA >60 12/17/2014 0219   GFRAA >60 12/17/2014 0219   Lab Results  Component Value Date   INR 1.0 01/15/2016   INR 1.07 03/06/2014   No results found for: PTT  SOCIAL HISTORY: Social History   Social History  . Marital status: Married    Spouse name: N/A  . Number of children: 3  . Years of education: N/A   Occupational History  . sales Unemployed    retired   Social History Main Topics  . Smoking status: Former Smoker    Packs/day: 0.75    Years: 15.00    Types: Cigarettes    Quit date: 01/12/1997  . Smokeless tobacco: Never Used     Comment: 3/4 pack per day. smoked 15 years off and on  . Alcohol use 2.4 oz/week    4 Cans of beer per week  . Drug use: No  . Sexual activity: Yes   Other Topics Concern  . Not on file   Social History Narrative  . No narrative on file    FAMILY HISTORY: Family History  Problem Relation Age of Onset  . Heart attack Father   . Cancer Sister     Uterine Cancer    REVIEW OF SYSTEMS: Reviewed from the review of systems from Dr. Guy Sandifer note dated 02/04/16 with the patient with changes noted in [facility]. Review of Systems:              General:                      normal appetite, decreased energy, no weight gain, no weight loss, no fever             Cardiac:                       + chest pain with exertion, + chest pain at rest, + SOB with exertion, no resting SOB, no PND, no orthopnea,  no palpitations, no arrhythmia, no atrial fibrillation, no LE edema, + dizzy spells, no syncope             Respiratory:                 + shortness of breath, no home oxygen, no productive cough, no dry cough, no bronchitis, no wheezing, no hemoptysis, no asthma, no pain with inspiration or cough, + sleep apnea, no CPAP at night             GI:                               no difficulty swallowing, + reflux, + frequent heartburn, + hiatal hernia, no abdominal pain, no constipation, no diarrhea, no hematochezia, no hematemesis, no melena             GU:                              no dysuria,  no frequency, no urinary tract infection, no hematuria, no enlarged prostate, History of kidney stones, no kidney disease             Vascular:                     no pain suggestive of claudication, no pain in feet, + occasional nocturnal leg cramps, no varicose veins, no DVT, no non-healing foot ulcer             Neuro:  no stroke, no TIA's, no seizures, no headaches, no temporary blindness one eye,  no slurred speech, no peripheral neuropathy, no chronic pain, no instability of gait, no memory/cognitive dysfunction             Musculoskeletal:         + shoulder arthritis, no joint swelling, no myalgias, no difficulty walking, normal mobility              Skin:                            no rash, no itching, no skin infections, no pressure sores or ulcerations             Psych:                         no anxiety, no depression, no nervousness, no unusual recent stress             Eyes:                           no blurry vision, no floaters, no recent vision changes, + wears glasses or contacts             ENT:                            no hearing loss, no loose or painful teeth, no dentures, last saw dentist in 2005             Hematologic:               + easy bruising, no abnormal bleeding, no clotting disorder, no frequent epistaxis             Endocrine:                   no  diabetes, does not check CBG's at home                 DENTAL HISTORY: CHIEF COMPLAINT: Patient referred by Dr. Roxy Manns for dental consultation.  HPI: [name] is a 71 year old male recently diagnosed with severe aortic stenosis. Patient with anticipated aortic valve replacement with Dr. Roxy Manns. Patient now seen as part of a pre-heart valve surgery dental protocol examination.  The patient currently denies acute toothaches, swellings, or abscesses. The patient last saw the dentist in approximately 2005. Patient had root canal therapy with an endodontist at that time. Patient denies having any primary dentist at this time. Patient denies having any partial dentures. Patient has dental phobia by report.  DENTAL EXAMINATION: GENERAL: The patient is a well-developed, well-nourished male in no acute distress. HEAD AND NECK: There is no palpable neck lymphadenopathy. The patient denies acute TMJ symptoms. INTRAORAL EXAM: The patient has normal saliva. There is no evidence of oral abscess formation. Patient has maxillary and mandibular interincisal attrition. DENTITION: Patient is now missing any teeth. Patient has multiple retained root segments #2, 3, 14, 15, 18, 20, 29, and 30. PERIODONTAL: Patient has chronic periodontitis with plaque accumulations, selective areas gingival recession, and no significant tooth mobility. DENTAL CARIES/SUBOPTIMAL RESTORATIONS: There are multiple dental caries noted as per dental charting form. ENDODONTIC: Patient currently denies acute pulpitis symptoms. Patient has multiple periapical radiolucencies. She has had previous root canal therapy associated with tooth numbers 2, 15, 18. CROWN AND  BRIDGE: Patient has crowns on tooth numbers 8 and 19. PROSTHODONTIC: Patient denies having any partial dentures. OCCLUSION: Patient has a poor occlusal scheme secondary to multiple retained root segments, deep overbite, and maxillary and mandibular interincisal  attrition.  RADIOGRAPHIC INTERPRETATION: An orthopantogram was taken and supplemented with a full series of dental radiographs. There are multiple retained root segments. There are dental caries noted. There is incipient to moderate bone loss. There are tooth numbers 2, 15, and 18 with previous root canal therapies.   ASSESSMENTS: 1. Severe aortic stenosis 2. Pre-heart valve surgery dental protocol 3. Chronic apical periodontitis 4. Multiple retained root segments 5. Dental caries 6. Chronic periodontitis with bone loss 7. Selective areas of gingival recession 8. Accretions-minimal 9. Maxillary and mandibular interincisal attrition 10. Poor occlusal scheme and malocclusion 11. Dental phobia 12. Risk for complications up to and including death with anticipated invasive dental procedures in the operating with general anesthesia due to overall cardiovascular compromise.   PLAN/RECOMMENDATIONS: 1. I discussed the risks, benefits, and complications of various treatment options with the patient in relationship to his medical and dental conditions, anticipated heart valve surgery, and risk for endocarditis. We discussed various treatment options to include no treatment, multiple extractions with alveoloplasty, pre-prosthetic surgery as indicated, periodontal therapy, dental restorations, root canal therapy, crown and bridge therapy, implant therapy, and replacement of missing teeth as indicated. We also discussed referral to an oral surgeon. The patient currently refuses a referral to an oral surgeon. The patient currently wishes to proceed with multiple extractions with alveoloplasty and initial periodontal therapy in the operating with general anesthesia on 02/07/2016 at 7:30 AM. Patient will then follow-up for his heart valve surgery as indicated with Dr. Roxy Manns after adequate healing. Patient will also need to find a new primary dentist for continued dental care once he is medically stable from his  anticipated heart valve surgery. Patient will require antibiotic premedication per American Heart Association guidelines after the heart valve surgery.    2. Discussion of findings with medical team and coordination of future medical and dental care as needed.  I spent in excess of  120 minutes during the conduct of this consultation and >50% of this time involved direct face-to-face encounter for counseling and/or coordination of the patient's care.    Lenn Cal, DDS

## 2016-02-06 ENCOUNTER — Encounter (HOSPITAL_COMMUNITY): Payer: Self-pay | Admitting: *Deleted

## 2016-02-06 ENCOUNTER — Other Ambulatory Visit: Payer: Self-pay | Admitting: Cardiology

## 2016-02-06 MED ORDER — SODIUM CHLORIDE 0.9 % IV SOLN: INTRAVENOUS | Status: DC

## 2016-02-06 NOTE — Progress Notes (Signed)
Dr. Rodell Perna, Anesthesia, made aware of anesthesia consult ordered by Dr. Enrique Sack.  MD  reviewed Dr. Ricard Dillon, Cardiology, "Cardiothoracic Surgery Consultation Report."  MD advised that pt is okay to proceed with dental procedure; no new orders given.

## 2016-02-06 NOTE — Anesthesia Preprocedure Evaluation (Addendum)
Anesthesia Evaluation  Patient identified by MRN, date of birth, ID band Patient awake    Reviewed: Allergy & Precautions, NPO status , Patient's Chart, lab work & pertinent test results  History of Anesthesia Complications Negative for: history of anesthetic complications  Airway Mallampati: II  TM Distance: >3 FB Neck ROM: Full    Dental  (+) Poor Dentition, Dental Advisory Given   Pulmonary sleep apnea , former smoker,    Pulmonary exam normal        Cardiovascular hypertension, + angina + CAD, + CABG and +CHF  negative cardio ROS   Rhythm:Regular + Systolic murmurs Severe 3 vessel and left main occlusive CAD 2. Patent LIMA to the LAD and first diagonal 3. Patent SVG to OM1 and OM2. The first OM is tiny and diffusely diseased. The stent in the mid SVG is widely patent 4. Patent SVG to PDA and PLOM 5. Moderate Aortic stenosis by cath but Severe AS by Dobutamine stress Echo c/w low gradient severe AS 6. Normal right heart pressures. 7. Normal LV filling pressures 8. Normal cardiac output.  Plan: consider for AVR/TAVR.    Neuro/Psych negative neurological ROS  negative psych ROS   GI/Hepatic Neg liver ROS, hiatal hernia, PUD, GERD  ,  Endo/Other  negative endocrine ROS  Renal/GU Renal diseasenegative Renal ROS     Musculoskeletal negative musculoskeletal ROS (+)   Abdominal   Peds  Hematology negative hematology ROS (+)   Anesthesia Other Findings Day of surgery medications reviewed with the patient.  Reproductive/Obstetrics                           Anesthesia Physical Anesthesia Plan  ASA: IV  Anesthesia Plan: General   Post-op Pain Management:    Induction: Intravenous  Airway Management Planned: Nasal ETT  Additional Equipment:   Intra-op Plan:   Post-operative Plan: Extubation in OR  Informed Consent: I have reviewed the patients History and Physical, chart,  labs and discussed the procedure including the risks, benefits and alternatives for the proposed anesthesia with the patient or authorized representative who has indicated his/her understanding and acceptance.   Dental advisory given  Plan Discussed with: CRNA and Anesthesiologist  Anesthesia Plan Comments:        Anesthesia Quick Evaluation

## 2016-02-06 NOTE — Progress Notes (Signed)
Tomi Bamberger, [employer] Representative,  made aware that procedure will likely interfere with device function. Device should be programmed tachy therapies disabled asynchronous pacing during procedure and returned to normal programming after procedure. Tomi Bamberger made aware that pt arrives at 5:30 A.M. tommorow for a 7:30 A.M. procedure.

## 2016-02-06 NOTE — Telephone Encounter (Signed)
Rx request sent to pharmacy.  

## 2016-02-06 NOTE — Progress Notes (Signed)
Pt denies SOB and chest pain. Pt under the care of Dr. [name], Cardiology. Pt made aware to stop taking vitamins, fish oil , Melatonin and herbal medications. Do not take any NSAIDs ie: Ibuprofen, Advil, Naproxen BC and Goody Powder. Pt verbalized understanding of all pre-op instructions.

## 2016-02-07 ENCOUNTER — Ambulatory Visit (HOSPITAL_COMMUNITY): Payer: Medicare Other | Admitting: Anesthesiology

## 2016-02-07 ENCOUNTER — Encounter (HOSPITAL_COMMUNITY)
Admission: RE | Disposition: A | Discharge status: Home / Self Care | Payer: Self-pay | Source: Ambulatory Visit | Attending: Dentistry

## 2016-02-07 ENCOUNTER — Other Ambulatory Visit: Payer: Self-pay

## 2016-02-07 ENCOUNTER — Ambulatory Visit (HOSPITAL_COMMUNITY)
Admission: RE | Admit: 2016-02-07 | Discharge: 2016-02-07 | Disposition: A | Discharge status: Home / Self Care | Payer: Medicare Other | Source: Ambulatory Visit | Attending: Dentistry | Admitting: Dentistry

## 2016-02-07 ENCOUNTER — Encounter (HOSPITAL_COMMUNITY): Payer: Self-pay | Admitting: Certified Registered Nurse Anesthetist

## 2016-02-07 DIAGNOSIS — I5042 Chronic combined systolic (congestive) and diastolic (congestive) heart failure: Secondary | ICD-10-CM | POA: Diagnosis not present

## 2016-02-07 DIAGNOSIS — Z951 Presence of aortocoronary bypass graft: Secondary | ICD-10-CM | POA: Insufficient documentation

## 2016-02-07 DIAGNOSIS — Z79899 Other long term (current) drug therapy: Secondary | ICD-10-CM | POA: Diagnosis not present

## 2016-02-07 DIAGNOSIS — K029 Dental caries, unspecified: Secondary | ICD-10-CM

## 2016-02-07 DIAGNOSIS — I110 Hypertensive heart disease with heart failure: Secondary | ICD-10-CM | POA: Diagnosis not present

## 2016-02-07 DIAGNOSIS — K045 Chronic apical periodontitis: Secondary | ICD-10-CM | POA: Insufficient documentation

## 2016-02-07 DIAGNOSIS — K036 Deposits [accretions] on teeth: Secondary | ICD-10-CM

## 2016-02-07 DIAGNOSIS — K083 Retained dental root: Secondary | ICD-10-CM

## 2016-02-07 DIAGNOSIS — I2510 Atherosclerotic heart disease of native coronary artery without angina pectoris: Secondary | ICD-10-CM | POA: Diagnosis not present

## 2016-02-07 DIAGNOSIS — E7800 Pure hypercholesterolemia, unspecified: Secondary | ICD-10-CM | POA: Diagnosis not present

## 2016-02-07 DIAGNOSIS — E785 Hyperlipidemia, unspecified: Secondary | ICD-10-CM | POA: Diagnosis not present

## 2016-02-07 DIAGNOSIS — Z955 Presence of coronary angioplasty implant and graft: Secondary | ICD-10-CM | POA: Insufficient documentation

## 2016-02-07 DIAGNOSIS — Z7982 Long term (current) use of aspirin: Secondary | ICD-10-CM | POA: Diagnosis not present

## 2016-02-07 DIAGNOSIS — Z87891 Personal history of nicotine dependence: Secondary | ICD-10-CM | POA: Insufficient documentation

## 2016-02-07 DIAGNOSIS — K219 Gastro-esophageal reflux disease without esophagitis: Secondary | ICD-10-CM | POA: Insufficient documentation

## 2016-02-07 DIAGNOSIS — I2582 Chronic total occlusion of coronary artery: Secondary | ICD-10-CM | POA: Diagnosis not present

## 2016-02-07 DIAGNOSIS — Z95810 Presence of automatic (implantable) cardiac defibrillator: Secondary | ICD-10-CM | POA: Diagnosis not present

## 2016-02-07 DIAGNOSIS — I350 Nonrheumatic aortic (valve) stenosis: Secondary | ICD-10-CM

## 2016-02-07 DIAGNOSIS — K0530 Chronic periodontitis, unspecified: Secondary | ICD-10-CM

## 2016-02-07 HISTORY — PX: MULTIPLE EXTRACTIONS WITH ALVEOLOPLASTY: SHX5342

## 2016-02-07 HISTORY — DX: Dental caries, unspecified: K02.9

## 2016-02-07 LAB — CBC
HCT: 40.1 % (ref 39.0–52.0)
HEMOGLOBIN: 13.4 g/dL (ref 13.0–17.0)
MCH: 30 pg (ref 26.0–34.0)
MCHC: 33.4 g/dL (ref 30.0–36.0)
MCV: 89.9 fL (ref 78.0–100.0)
PLATELETS: 149 10*3/uL — AB (ref 150–400)
RBC: 4.46 MIL/uL (ref 4.22–5.81)
RDW: 12.7 % (ref 11.5–15.5)
WBC: 5.9 10*3/uL (ref 4.0–10.5)

## 2016-02-07 LAB — BASIC METABOLIC PANEL
ANION GAP: 11 (ref 5–15)
BUN: 19 mg/dL (ref 6–20)
CHLORIDE: 106 mmol/L (ref 101–111)
CO2: 24 mmol/L (ref 22–32)
Calcium: 8.9 mg/dL (ref 8.9–10.3)
Creatinine, Ser: 0.84 mg/dL (ref 0.61–1.24)
Glucose, Bld: 103 mg/dL — ABNORMAL HIGH (ref 65–99)
POTASSIUM: 3.9 mmol/L (ref 3.5–5.1)
SODIUM: 141 mmol/L (ref 135–145)

## 2016-02-07 SURGERY — MULTIPLE EXTRACTION WITH ALVEOLOPLASTY
Anesthesia: General | Site: Mouth

## 2016-02-07 MED ORDER — ROCURONIUM BROMIDE 10 MG/ML (PF) SYRINGE
PREFILLED_SYRINGE | INTRAVENOUS | Status: DC | PRN
  Administered 2016-02-07: 40 mg via INTRAVENOUS

## 2016-02-07 MED ORDER — MIDAZOLAM HCL 2 MG/2ML IJ SOLN
INTRAMUSCULAR | Status: AC
  Filled 2016-02-07: qty 2

## 2016-02-07 MED ORDER — OXYMETAZOLINE HCL 0.05 % NA SOLN
NASAL | Status: AC
  Filled 2016-02-07: qty 15

## 2016-02-07 MED ORDER — NITROGLYCERIN 0.4 MG SL SUBL: 0.4000 mg | SUBLINGUAL_TABLET | SUBLINGUAL | 3 refills | Status: DC | PRN

## 2016-02-07 MED ORDER — ETOMIDATE 2 MG/ML IV SOLN
INTRAVENOUS | Status: DC | PRN
  Administered 2016-02-07: 20 mg via INTRAVENOUS

## 2016-02-07 MED ORDER — 0.9 % SODIUM CHLORIDE (POUR BTL) OPTIME
TOPICAL | Status: DC | PRN
  Administered 2016-02-07: 1000 mL

## 2016-02-07 MED ORDER — OXYCODONE-ACETAMINOPHEN 5-325 MG PO TABS: ORAL_TABLET | ORAL | 0 refills | Status: DC

## 2016-02-07 MED ORDER — LIDOCAINE-EPINEPHRINE 2 %-1:100000 IJ SOLN
INTRAMUSCULAR | Status: AC
  Filled 2016-02-07: qty 10.2

## 2016-02-07 MED ORDER — ONDANSETRON HCL 4 MG/2ML IJ SOLN
INTRAMUSCULAR | Status: DC | PRN
  Administered 2016-02-07: 4 mg via INTRAVENOUS

## 2016-02-07 MED ORDER — SUCCINYLCHOLINE CHLORIDE 200 MG/10ML IV SOSY
PREFILLED_SYRINGE | INTRAVENOUS | Status: AC
  Filled 2016-02-07: qty 10

## 2016-02-07 MED ORDER — ETOMIDATE 2 MG/ML IV SOLN
INTRAVENOUS | Status: AC
  Filled 2016-02-07: qty 10

## 2016-02-07 MED ORDER — HEMOSTATIC AGENTS (NO CHARGE) OPTIME
TOPICAL | Status: DC | PRN
  Administered 2016-02-07: 1 via TOPICAL

## 2016-02-07 MED ORDER — LACTATED RINGERS IV SOLN
INTRAVENOUS | Status: DC | PRN
  Administered 2016-02-07 (×2): via INTRAVENOUS

## 2016-02-07 MED ORDER — CEFAZOLIN SODIUM 1 G IJ SOLR
INTRAMUSCULAR | Status: AC
  Filled 2016-02-07: qty 20

## 2016-02-07 MED ORDER — LIDOCAINE-EPINEPHRINE 2 %-1:100000 IJ SOLN
INTRAMUSCULAR | Status: DC | PRN
  Administered 2016-02-07: 10.8 mL via INTRADERMAL

## 2016-02-07 MED ORDER — PROMETHAZINE HCL 25 MG/ML IJ SOLN: 6.2500 mg | INTRAMUSCULAR | Status: DC | PRN

## 2016-02-07 MED ORDER — AMINOCAPROIC ACID SOLUTION 5% (50 MG/ML)
10.0000 mL | ORAL | Status: DC
  Filled 2016-02-07: qty 100

## 2016-02-07 MED ORDER — SUGAMMADEX SODIUM 200 MG/2ML IV SOLN
INTRAVENOUS | Status: DC | PRN
  Administered 2016-02-07: 200 mg via INTRAVENOUS

## 2016-02-07 MED ORDER — OXYCODONE-ACETAMINOPHEN 5-325 MG PO TABS: 1.0000 | ORAL_TABLET | ORAL | Status: DC | PRN

## 2016-02-07 MED ORDER — DEXAMETHASONE SODIUM PHOSPHATE 10 MG/ML IJ SOLN
INTRAMUSCULAR | Status: DC | PRN
  Administered 2016-02-07: 10 mg via INTRAVENOUS

## 2016-02-07 MED ORDER — CEFAZOLIN SODIUM 1 G IJ SOLR
INTRAMUSCULAR | Status: DC | PRN
  Administered 2016-02-07: 2 g via INTRAMUSCULAR

## 2016-02-07 MED ORDER — BUPIVACAINE-EPINEPHRINE 0.5% -1:200000 IJ SOLN
INTRAMUSCULAR | Status: DC | PRN
  Administered 2016-02-07: 3.6 mL

## 2016-02-07 MED ORDER — BUPIVACAINE-EPINEPHRINE (PF) 0.5% -1:200000 IJ SOLN
INTRAMUSCULAR | Status: AC
  Filled 2016-02-07: qty 3.6

## 2016-02-07 MED ORDER — HYDROMORPHONE HCL 1 MG/ML IJ SOLN: 0.2500 mg | INTRAMUSCULAR | Status: DC | PRN

## 2016-02-07 MED ORDER — OXYMETAZOLINE HCL 0.05 % NA SOLN
NASAL | Status: DC | PRN
  Administered 2016-02-07: 2 via NASAL

## 2016-02-07 MED ORDER — ROCURONIUM BROMIDE 50 MG/5ML IV SOSY
PREFILLED_SYRINGE | INTRAVENOUS | Status: AC
  Filled 2016-02-07: qty 5

## 2016-02-07 MED ORDER — MIDAZOLAM HCL 5 MG/5ML IJ SOLN
INTRAMUSCULAR | Status: DC | PRN
  Administered 2016-02-07: 2 mg via INTRAVENOUS

## 2016-02-07 MED ORDER — FENTANYL CITRATE (PF) 100 MCG/2ML IJ SOLN
INTRAMUSCULAR | Status: DC | PRN
  Administered 2016-02-07: 50 ug via INTRAVENOUS
  Administered 2016-02-07: 100 ug via INTRAVENOUS
  Administered 2016-02-07 (×2): 50 ug via INTRAVENOUS

## 2016-02-07 MED ORDER — SUCCINYLCHOLINE CHLORIDE 200 MG/10ML IV SOSY
PREFILLED_SYRINGE | INTRAVENOUS | Status: DC | PRN
  Administered 2016-02-07: 100 mg via INTRAVENOUS

## 2016-02-07 MED ORDER — SODIUM CHLORIDE 0.9 % IJ SOLN
INTRAMUSCULAR | Status: AC
  Filled 2016-02-07: qty 10

## 2016-02-07 MED ORDER — FENTANYL CITRATE (PF) 250 MCG/5ML IJ SOLN
INTRAMUSCULAR | Status: AC
  Filled 2016-02-07: qty 5

## 2016-02-07 MED ORDER — PROPOFOL 10 MG/ML IV BOLUS
INTRAVENOUS | Status: AC
  Filled 2016-02-07: qty 20

## 2016-02-07 SURGICAL SUPPLY — 36 items
ALCOHOL 70% 16 OZ (MISCELLANEOUS) ×2 IMPLANT
ATTRACTOMAT 16X20 MAGNETIC DRP (DRAPES) ×2 IMPLANT
BLADE SURG 15 STRL LF DISP TIS (BLADE) ×2 IMPLANT
BLADE SURG 15 STRL SS (BLADE) ×4
COVER SURGICAL LIGHT HANDLE (MISCELLANEOUS) ×2 IMPLANT
GAUZE PACKING FOLDED 2  STR (GAUZE/BANDAGES/DRESSINGS) ×1
GAUZE PACKING FOLDED 2 STR (GAUZE/BANDAGES/DRESSINGS) ×1 IMPLANT
GAUZE SPONGE 4X4 16PLY XRAY LF (GAUZE/BANDAGES/DRESSINGS) ×4 IMPLANT
GLOVE BIOGEL PI IND STRL 6 (GLOVE) ×1 IMPLANT
GLOVE BIOGEL PI INDICATOR 6 (GLOVE) ×1
GLOVE SURG ORTHO 8.0 STRL STRW (GLOVE) ×2 IMPLANT
GLOVE SURG SS PI 6.0 STRL IVOR (GLOVE) ×2 IMPLANT
GOWN STRL REUS W/ TWL LRG LVL3 (GOWN DISPOSABLE) ×1 IMPLANT
GOWN STRL REUS W/TWL 2XL LVL3 (GOWN DISPOSABLE) ×2 IMPLANT
GOWN STRL REUS W/TWL LRG LVL3 (GOWN DISPOSABLE) ×2
HEMOSTAT SURGICEL 2X14 (HEMOSTASIS) IMPLANT
KIT BASIN OR (CUSTOM PROCEDURE TRAY) ×2 IMPLANT
KIT ROOM TURNOVER OR (KITS) ×2 IMPLANT
MANIFOLD NEPTUNE WASTE (CANNULA) ×2 IMPLANT
NEEDLE BLUNT 16X1.5 OR ONLY (NEEDLE) ×2 IMPLANT
NS IRRIG 1000ML POUR BTL (IV SOLUTION) ×2 IMPLANT
PACK EENT II TURBAN DRAPE (CUSTOM PROCEDURE TRAY) ×2 IMPLANT
PAD ARMBOARD 7.5X6 YLW CONV (MISCELLANEOUS) ×2 IMPLANT
SPONGE SURGIFOAM ABS GEL 100 (HEMOSTASIS) ×2 IMPLANT
SPONGE SURGIFOAM ABS GEL 12-7 (HEMOSTASIS) IMPLANT
SPONGE SURGIFOAM ABS GEL SZ50 (HEMOSTASIS) IMPLANT
SUCTION FRAZIER HANDLE 10FR (MISCELLANEOUS) ×1
SUCTION TUBE FRAZIER 10FR DISP (MISCELLANEOUS) ×1 IMPLANT
SUT CHROMIC 3 0 PS 2 (SUTURE) ×8 IMPLANT
SUT CHROMIC 4 0 P 3 18 (SUTURE) IMPLANT
SYR 50ML SLIP (SYRINGE) ×2 IMPLANT
TOWEL OR 17X26 10 PK STRL BLUE (TOWEL DISPOSABLE) ×2 IMPLANT
TUBE CONNECTING 12X1/4 (SUCTIONS) ×2 IMPLANT
WATER STERILE IRR 1000ML POUR (IV SOLUTION) ×2 IMPLANT
WATER TABLETS ICX (MISCELLANEOUS) ×2 IMPLANT
YANKAUER SUCT BULB TIP NO VENT (SUCTIONS) ×2 IMPLANT

## 2016-02-07 NOTE — Transfer of Care (Signed)
Immediate Anesthesia Transfer of Care Note  Patient: [name]  Procedure(s) Performed: Procedure(s): Extraction of tooth #'s 1,2,3,14,15, 18, 20, 29,30,31 with alveoloplasty and dental cleaning of teeth. (N/A)  Patient Location: PACU  Anesthesia Type:General  Level of Consciousness: awake, alert , oriented and patient cooperative  Airway & Oxygen Therapy: Patient Spontanous Breathing and Patient connected to face mask oxygen  Post-op Assessment: Report given to RN, Post -op Vital signs reviewed and stable and Patient moving all extremities X 4  Post vital signs: Reviewed and stable  Last Vitals:  Vitals:   02/07/16 0629 02/07/16 1002  BP: (!) 168/75   Pulse: (!) 59   Resp: 20   Temp: 36.8 C 36.5 C    Last Pain:  Vitals:   02/07/16 0629  TempSrc: Oral         Complications: No apparent anesthesia complications

## 2016-02-07 NOTE — Op Note (Signed)
OPERATIVE REPORT  Patient:            [name] Date of Birth:  03/23/1945 MRN:                [id number]   DATE OF PROCEDURE:  02/07/2016  PREOPERATIVE DIAGNOSES: 1. Severe aortic stenosis 2. Pre-heart of surgery dental protocol 3. Chronic apical periodontitis 4. Multiple retained root segments 5. Dental caries 6. Chronic periodontitis 7. Accretions  POSTOPERATIVE DIAGNOSES: 1. Severe aortic stenosis 2. Pre-heart of surgery dental protocol 3. Chronic apical periodontitis 4. Multiple retained root segments 5. Dental caries 6. Chronic periodontitis 7. Accretions  OPERATIONS: 1. Multiple extraction of tooth numbers 1, 2, 3, 14, 15, 18, 20, 29, 30, and 31 2. Four Quadrants of alveoloplasty 3. Dental cleaning of remaining dentition   SURGEON: Lenn Cal, DDS  ASSISTANT: Camie Patience, (dental assistant)  ANESTHESIA: General anesthesia via nasoendotracheal tube.  MEDICATIONS: 1. Ancef 2 g IV prior to invasive dental procedures. 2. Local anesthesia with a total utilization of 4 carpules each containing 34 mg of lidocaine with 0.017 mg of epinephrine as well as 2 carpules each containing 9 mg of bupivacaine with 0.009 mg of epinephrine.  SPECIMENS: There are 10 teeth that were discarded.  DRAINS: None  CULTURES: None  COMPLICATIONS: None   ESTIMATED BLOOD LOSS: 100 mLs.  INTRAVENOUS FLUIDS: Lactated ringers solution per the anesthesia team  INDICATIONS: The patient was recently diagnosed with severe aortic stenosis.  A dental consultation was then requested to evaluate poor dentition.  The patient was examined and treatment planned for multiple extractions with alveoloplasty and dental cleaning of remaining teeth.  This treatment plan was formulated to decrease the risks and complications associated with dental infection from affecting the patient's systemic health and anticipated heart valve surgery.  OPERATIVE FINDINGS: Patient was examined  operating room number 15.  The teeth were identified for extraction. The patient was noted be affected by chronic periodontitis, chronic apical periodontitis, retained root segments, dental caries, and accretions.   DESCRIPTION OF PROCEDURE: Patient was brought to the main operating room number 15. Patient was then placed in the supine position on the operating table. General anesthesia was then induced per the anesthesia team. The patient was then prepped and draped in the usual manner for dental medicine procedure. A timeout was performed. The patient was identified and procedures were verified. A throat pack was placed at this time. The oral cavity was then thoroughly examined with the findings noted above. The patient was then ready for dental medicine procedure as follows:  Local anesthesia was then administered sequentially with a total utilization of 4 carpules each containing 34 mg of lidocaine with 0.017 mg of epinephrine as well as 2 carpules  each containing 9 mg bupivacaine with 0.009 mg of epinephrine.  The Maxillary left and right quadrants first approached. Anesthesia was then delivered utilizing infiltration with lidocaine with epinephrine. A #15 blade incision was then made from the maxillary right tuberosity and extended to the mesial of #4.  A  surgical flap was then carefully reflected. Tooth numbers 1, 2, 3 were then subluxated with a series of straight elevators. Appropriate amounts of buccal and interseptal bone were then removed utilizing a surgical handpiece and bur and copious amounts of sterile water around tooth numbers 1, 2, and 3..  The teeth were then again subluxated with a series of straight elevators. The coronal aspect of tooth numbers 1 and 3 were then removed with a 150 forceps  leaving roots remaining. Further bone was then removed around the roots in the area of tooth numbers 1, 2, and 3. The roots were then elevated out appropriately. Alveoloplasty was then performed  utilizing a rongeurs and bone file to help achieve primary closure. The surgical site was then irrigated with copious amounts sterile saline. A piece of Surgifoam was placed in the extraction sockets appropriately. The surgical site was then closed from the maxillary right tuberosity and extended to the distal of #4 utilizing 3-0 chromic gut suture in a continuous interrupted suture technique 1.   The maxillary left surgical site was then approached. A 15 blade incision was made from the mesial of #16 and extended to the mesial of #13. A surgical flap was then carefully reflected. Tooth numbers 14 and 15 were then subluxated with a series of straight elevators. Bone was then removed utilizing a surgical handpiece and bur and copious amounts sterile saline around the retained roots in the area of tooth numbers 14 and 15. These roots were then elevated out and removed with a 150 forceps without further complication. Alveoloplasty was performed utilizing a rongeurs and bone file to help achieve primary closure. The surgical site was then irrigated with copious amounts sterile saline. The tissues were approximated and trimmed appropriately. Tooth Surgifoam was placed in the extraction sockets. The surgical site was then closed from the mesial of #16 extended the distal of #13 utilizing 3-0 chromic gut suture in a continuous interrupted suture technique 1.   At this point time, the mandibular quadrants were approached. The patient was given bilateral inferior alveolar nerve blocks and long buccal nerve blocks utilizing the bupivacaine with epinephrine. Further infiltration was then achieved utilizing the lidocaine with epinephrine. A 15 blade incision was then made from the mesial #17 and extended to the distal of #19.  A surgical flap was then reflected on the buccal aspect. Appropriate amounts of buccal and interseptal bone was removed around retained roots in the area of tooth #18. The roots were then removed  with a rongeurs without further complication. Alveoloplasty was performed to help achieve primary closure.Surgical site was irrigated with copious amounts sterile saline.  A piece of Surgifoam was placed in the sockets. The surgical site was then closed from the mesial of #17 and extended to the #19 utilizing 3-0 chromic gut suture in a continuous interrupted suture technique 1. At this point in time the retained roots in the area of tooth #20 was removed with a rongeurs without complications. The socket was curetted and compressed appropriately. The surgical site was irrigated with copious amounts sterile saline. The surgical site was then closed with one interrupted 3-0 chromic gut suture.  At this point time the mandibular right surgical site was approached. 15 blade incision was made from the mesial of #32 and extended to the mesial of #28.  A surgical flap was then carefully reflected. Appropriate amounts of buccal and interseptal bone were then removed utilizing a surgical handpiece and copious amount of sterile water around tooth numbers 29, 30, and 31. The teeth were then subluxated with a series of straight elevators.  Tooth numbers 29, 30 and the coronal aspect of tooth #31 were then removed at this time. Further bone was then removed around retained roots in the area of tooth #31. The roots were then subluxated and removed with a series of cryers elevators without complications. Alveoloplasty was then performed utilizing a rongeurs and bone file. The tissues were approximated and appropriately. The surgical  site was then irrigated with copious amounts sterile saline. A piece of Surgifoam was placed in each extraction socket as needed. The surgical site was then closed from the mesial of #32 and extended to the distal of #28 utilizing 3-0 chromic gut suture in a continuous interrupted suture technique 1.  At this point in time, the remaining dentition was approached. An adult prophylaxis was  performed utilizing a Sonic scaler. A series of hand curettes were used to remove accretions. A sonic scaler was then again used to further remove accretions.  At this point time, the entire mouth was irrigated with copious amounts of sterile saline. The patient was examined for complications, seeing none, the dental medicine procedure was deemed to be complete. The throat pack was removed at this time. An oral airway was then placed at the request of the anesthesia team. A series of 4 x 4 gauze were placed in the mouth to aid hemostasis. The patient was then handed over to the anesthesia team for final disposition. After an appropriate amount of time, the patient was extubated and taken to the postanesthsia care unit in good condition. All counts were correct for the dental medicine procedure.   Lenn Cal, DDS.

## 2016-02-07 NOTE — Progress Notes (Signed)
Affton at bedside to turn pacer on & interrogate- pacer within norms per Tomi Bamberger- pt ok for d/c to home

## 2016-02-07 NOTE — Anesthesia Procedure Notes (Signed)
Procedure Name: Intubation Date/Time: 02/07/2016 8:00 AM Performed by: Everlean Cherry A Pre-anesthesia Checklist: Patient identified, Emergency Drugs available and Suction available Patient Re-evaluated:Patient Re-evaluated prior to inductionOxygen Delivery Method: Circle system utilized Preoxygenation: Pre-oxygenation with 100% oxygen Intubation Type: IV induction Ventilation: Mask ventilation without difficulty Laryngoscope Size: Miller and 2 Grade View: Grade II Nasal Tubes: Left, Nasal prep performed, Nasal Rae and Magill forceps- large, utilized Tube size: 7.0 mm Number of attempts: 4 Placement Confirmation: ETT inserted through vocal cords under direct vision,  positive ETCO2 and breath sounds checked- equal and bilateral ETT to lip (cm): Atbendin NASAL RAE. Tube secured with: Tape Dental Injury: Teeth and Oropharynx as per pre-operative assessment  Difficulty Due To: Difficulty was unanticipated Comments: Nasal prep performed prior to placing nasal rae.  DL x1 with Mil 2.  No view.  DL x2 with MAC 4, grade 3 view.  DL x3 with Mil 2 by Dr. Tobias Alexander.  Unsuccessful placement.  Patient's VSS.  Mask ventilated.  DL x4 with Mil 2 and grade 2 view.  Successful placement of 7.0 nasal rae.  EBBS and VSS.

## 2016-02-07 NOTE — Progress Notes (Signed)
PRE-OPERATIVE NOTE:  02/07/2016 [name] [id number]  VITALS: BP (!) 168/75   Pulse (!) 59   Temp 98.3 F (36.8 C) (Oral)   Resp 20   Ht 5\' 10"  (1.778 m)   Wt 210 lb (95.3 kg)   SpO2 98%   BMI 30.13 kg/m   Lab Results  Component Value Date   WBC 5.9 02/07/2016   HGB 13.4 02/07/2016   HCT 40.1 02/07/2016   MCV 89.9 02/07/2016   PLT 149 (L) 02/07/2016   BMET    Component Value Date/Time   NA 146 01/15/2016 0939   K 4.5 01/15/2016 0939   CL 108 01/15/2016 0939   CO2 27 01/15/2016 0939   GLUCOSE 96 01/15/2016 0939   BUN 23 01/15/2016 0939   CREATININE 0.68 (L) 01/15/2016 0939   CALCIUM 9.2 01/15/2016 0939   GFRNONAA >60 12/17/2014 0219   GFRAA >60 12/17/2014 0219    Lab Results  Component Value Date   INR 1.0 01/15/2016   INR 1.07 03/06/2014   No results found for: PTT   [name] presents for Multiple dental extractions with alveoloplasty and initial periodontal therapy in the operating room.   SUBJECTIVE: The patient denies any acute medical or dental changes and agrees to proceed with treatment as planned.  EXAM: No sign of acute dental changes.  ASSESSMENT: Patient is affected by retained root segments, chronic apical periodontitis, dental caries, chronic periodontitis, and accretions.  PLAN: Patient agrees to proceed with treatment as planned in the operating room as previously discussed and accepts the risks, benefits, and complications of the proposed treatment. Patient is aware of the risk for bleeding, bruising, swelling, infection, pain, nerve damage, soft tissue damage, damage to adjacent teeth, sinus involvement, root tip fracture, mandible fracture, and the risks of complications associated with the anesthesia. Patient also is aware of the potential for other complications up to and including death due to his overall cardiovascular and respiratory compromise.   Lenn Cal, DDS

## 2016-02-07 NOTE — H&P (Signed)
02/07/2016  Patient:            [name] Date of Birth:  March 31, 1945 MRN:                [id number]   BP (!) 168/75   Pulse (!) 59   Temp 98.3 F (36.8 C) (Oral)   Resp 20   Ht 5\' 10"  (1.778 m)   Wt 210 lb (95.3 kg)   SpO2 98%   BMI 30.13 kg/m    [name] presents for dental procedure in the OR with general anesthesia.  Patient denies acute medical or dental changes. Please see note from Dr. Roxy Manns dated 02/04/16 to act as H&P for dental OR procedure.  Dr. Teena Dunk    Progress Notes Encounter Date: 02/04/2016 12:00 PM Rexene Alberts, MD  Cardiothoracic Surgery    [] Hide copied text  [place] SURGERY CONSULTATION REPORT  Referring Provider is [name], [name], MD PCP is Pcp Not In System      Chief Complaint  Patient presents with  . Aortic Stenosis    Surgical eval for AVR v/s TAVR, review all studies    HPI:  Patient is a 71 year old male with a long history of coronary artery disease status post coronary artery bypass grafting the remote past, vein graft disease status post PCI and stenting, aortic stenosis, chronic combined systolic and diastolic congestive heart failure, GE reflux disease with Barrett's esophagus, hypertension, hyperlipidemia, and obstructive sleep apnea who has been referred for surgical consultation to discuss treatment options for management of severe symptomatic low gradient low ejection fraction aortic stenosis. The patient's cardiac history dates back to 1996 when he presented with symptoms of angina pectoris and was found to have severe three-vessel coronary artery disease. He underwent coronary artery bypass grafting 7 by Dr. Redmond Pulling with grafts placed including left internal mammary artery sewn sequentially to the diagonal and left anterior descending coronary artery, sequential saphenous vein graft to the obtuse marginal and distal left  circumflex coronary artery, and sequential saphenous vein graft sewn to the acute marginal, posterior descending coronary artery and right posterolateral branch. The patient states that he recovered from his surgery uneventfully and did well for many years. He has been followed by Dr. [name] and underwent catheterization 2007 which showed that all of his grafts were patent. In 2016 he developed chest pain and repeat cath revealed continued patency of all the grafts but 90% stenosis in the terminal segment of the sequential saphenous vein graft to the circumflex system. This was stented with a drug-eluting stent. Ever since then the patient has continued to have episodes of chest pain. Symptoms have been somewhat atypical. A time symptoms have been thought to be related to eating spicy foods. He underwent cholecystectomy in January 2017, but the patient's chest pain persisted.  He underwent EGD revealing Barrett's esophagus but no evidence of active esophagitis. He takes Protonix regularly. The patient has continued to have episodes of chest pain that occur both with activity and at rest. Symptoms are typically relieved by sublingual nitroglycerin. In December 2016 the patient had a syncopal episode. Echocardiogram performed at that time revealed ejection fraction estimated 30-35% with moderate to severe aortic stenosis. Mean transvalvular gradient was reported 24 mmHg with aortic valve area calculated 0.8-0.9. He was noted to have left bundle branch block. He underwent placement of a biventricular pacemaker ICD for CRT.  Symptoms have persisted. Recent follow-up transthoracic  echocardiogram performed 11/20/2015 revealed somewhat increased gradient across the aortic valve with peak velocity across the aortic valve measured 3.4 m/s corresponding to mean transvalvular gradient estimated between 27 and 31 mmHg.  The DVI was reported 0.23.  Left ventricular ejection fraction was estimated 35%. The patient subsequently  underwent dobutamine stress echocardiography confirming the presence of low gradient low ejection fraction severe aortic stenosis with increased ejection fraction and increased transvalvular gradient estimated 41 mmHg at peak dobutamine infusion.  The patient subsequently underwent left and right heart catheterization on 01/22/2016. The patient had severe native coronary artery disease with 100% chronic occlusion of essentially all of the proximal coronary arteries. There remained continued patency of all the previously placed bypass grafts, including the stented segment of the vein graft to the left circumflex system. Hemodynamic findings at catheterization revealed peak to peak and mean transvalvular gradient across the aortic valve measured 27 and 20.8 mmHg, respectively.  The aortic valve area was calculated 1.29 centimeters squared. Pulmonary artery pressures were normal.  The patient was referred for surgical consultation.  The patient is married and lives locally in [place] with his wife. He has been retired for approximately 10 years, having previously worked [employer] for many years. He has remained reasonably active during retirement. He does not exercise regularly but he keeps himself busy around the house doing a variety of chores. He describes a long history of episodes of substernal chest pain and chest tightness that can occur both with activity and at rest. Symptoms at times are worse at night. Symptoms are usually relieved by administration of sublingual nitroglycerin. The patient admits to mild exertional shortness of breath although he states that his breathing does not limit his physical activities to any significant degree. He has had some dizzy spells without syncope recently. He felt particular dizzy this morning but he feels as though he may be dehydrated as he has not had much of anything to drink all day. He denies any recent history of lower extremity edema, PND, or  orthopnea.      Past Medical History:  Diagnosis Date  . Aortic stenosis   . Barrett's esophageal ulceration   . Chronic systolic CHF (congestive heart failure) ([place])   . Coronary artery disease    a. CABG 1996: LIMA graft to the LAD and diagonal, sequential vein graft to the acute marginal, PDA, and posterior lateral branches of the right coronary, sequential saphenous vein graft to the obtuse marginal vessel and distal circumflex. b. Cath 2007 patent grafts. c. s/p DES to SVG-OM2 in 03/2014.  Marland Kitchen Coronary artery disease involving autologous vein coronary bypass graft with angina pectoris ([place]) 03/13/2014   PCI using DES in SVG to LCx system  . Gastroesophageal reflux disease   . History of erectile dysfunction   . History of hiatal hernia   . History of kidney stones   . History of obesity   . History of prostatitis   . Hypercholesterolemia   . Hypertension   . Kidney stones   . LBBB (left bundle branch block)   . OSA (obstructive sleep apnea)    "suppose to wear mask; I don't" (03/13/2014)  . PVC's (premature ventricular contractions)   . S/P CABG x 7 01/07/1995   LIMA to LAD-diagonal, SVG to OM-LCx, SVG to AM-PD-RPL         Past Surgical History:  Procedure Laterality Date  . [place]; ~ 2006   ; Ejection fraction is estimated at 45%  .  CARDIAC CATHETERIZATION N/A 01/22/2016   Procedure: Right/Left Heart Cath and Coronary/Graft Angiography;  Surgeon: [name] [name], MD;  Location: [facility];  Service: Cardiovascular;  Laterality: N/A;  . CHOLECYSTECTOMY N/A 01/23/2014   Procedure: LAPAROSCOPIC CHOLECYSTECTOMY WITH INTRAOPERATIVE CHOLANGIOGRAM ;  Surgeon: Fanny Skates, MD;  Location: [facility];  Service: General;  Laterality: N/A;  . COLONOSCOPY W/ POLYPECTOMY    . CORONARY ANGIOPLASTY WITH STENT PLACEMENT  03/13/2014   "1"  . CORONARY ARTERY BYPASS GRAFT  1996   "CABG X 7"  . CYSTOSCOPY WITH STENT PLACEMENT    . EP  IMPLANTABLE DEVICE N/A 12/17/2014   Procedure: BiV ICD Insertion CRT-D;  Surgeon: Evans Lance, MD;  Location: [facility];  Service: Cardiovascular;  Laterality: N/A;  . LEFT HEART CATHETERIZATION WITH CORONARY/GRAFT ANGIOGRAM N/A 03/13/2014   Procedure: LEFT HEART CATHETERIZATION WITH Beatrix Fetters;  Surgeon: [name] [name], MD;  [facility];  Service: Cardiovascular;  Laterality: N/A;  . TONSILLECTOMY  ~ 1950          Family History  Problem Relation Age of Onset  . Heart attack Father   . Cancer Sister     Uterine Cancer    Social History        Social History  . Marital status: Married    Spouse name: N/A  . Number of children: 3  . Years of education: N/A        Occupational History  . sales Unemployed    retired         Social History Main Topics  . Smoking status: Former Smoker    Packs/day: 0.75    Years: 15.00    Types: Cigarettes    Quit date: 01/12/1997  . Smokeless tobacco: Never Used     Comment: 3/4 pack per day. smoked 15 years off and on  . Alcohol use 2.4 oz/week    4 Cans of beer per week  . Drug use: No  . Sexual activity: Yes       Other Topics Concern  . Not on file      Social History Narrative  . No narrative on file          Current Outpatient Prescriptions  Medication Sig Dispense Refill  . Ascorbic Acid (VITAMIN C PO) Take 1,000 mg by mouth daily.     Marland Kitchen aspirin EC 81 MG EC tablet Take 1 tablet (81 mg total) by mouth daily.    Marland Kitchen atorvastatin (LIPITOR) 80 MG tablet Take 80 mg by mouth daily.    . Cholecalciferol (VITAMIN D PO) Take 5,000 Units by mouth daily.     . finasteride (PROSCAR) 5 MG tablet Take 5 mg by mouth daily.    Marland Kitchen ibuprofen (ADVIL,MOTRIN) 200 MG tablet Take 600 mg by mouth every 6 (six) hours as needed for moderate pain.    . isosorbide mononitrate (IMDUR) 60 MG 24 hr tablet Take 1 tablet (60 mg total) by mouth daily. 90 tablet 3  . metoprolol  succinate (TOPROL-XL) 50 MG 24 hr tablet Take 1 tablet (50 mg total) by mouth daily. Take with or immediately following a meal. 90 tablet 3  . nitroGLYCERIN (NITROSTAT) 0.4 MG SL tablet Place 1 tablet (0.4 mg total) under the tongue every 5 (five) minutes as needed for chest pain. 100 tablet 11  . oxybutynin (DITROPAN) 5 MG tablet Take 5 mg by mouth daily.     . pantoprazole (PROTONIX) 40 MG tablet Take  1 tablet (40 mg total) by mouth daily. 90 tablet 1  . ramipril (ALTACE) 5 MG capsule Take 1 capsule (5 mg total) by mouth daily. 90 capsule 3  . valACYclovir (VALTREX) 500 MG tablet Take 500 mg by mouth daily as needed (outbreak).      No current facility-administered medications for this visit.     No Known Allergies    Review of Systems:              General:                      normal appetite, decreased energy, no weight gain, no weight loss, no fever             Cardiac:                       + chest pain with exertion, + chest pain at rest, + SOB with exertion, no resting SOB, no PND, no orthopnea, no palpitations, no arrhythmia, no atrial fibrillation, no LE edema, + dizzy spells, no syncope             Respiratory:                 + shortness of breath, no home oxygen, no productive cough, no dry cough, no bronchitis, no wheezing, no hemoptysis, no asthma, no pain with inspiration or cough, + sleep apnea, no CPAP at night             GI:                               no difficulty swallowing, + reflux, + frequent heartburn, + hiatal hernia, no abdominal pain, no constipation, no diarrhea, no hematochezia, no hematemesis, no melena             GU:                              no dysuria,  no frequency, no urinary tract infection, no hematuria, no enlarged prostate, no kidney stones, no kidney disease             Vascular:                     no pain suggestive of claudication, no pain in feet, + occasional nocturnal leg cramps, no varicose veins, no DVT, no non-healing foot  ulcer             Neuro:                         no stroke, no TIA's, no seizures, no headaches, no temporary blindness one eye,  no slurred speech, no peripheral neuropathy, no chronic pain, no instability of gait, no memory/cognitive dysfunction             Musculoskeletal:         + arthritis, no joint swelling, no myalgias, no difficulty walking, normal mobility              Skin:                            no rash, no itching, no skin infections, no pressure sores or ulcerations  Psych:                         no anxiety, no depression, no nervousness, no unusual recent stress             Eyes:                           no blurry vision, no floaters, no recent vision changes, + wears glasses or contacts             ENT:                            no hearing loss, no loose or painful teeth, no dentures, last saw dentist many years ago             Hematologic:               + easy bruising, no abnormal bleeding, no clotting disorder, no frequent epistaxis             Endocrine:                   no diabetes, does not check CBG's at home                                                       Physical Exam:              BP (!) 88/38   Pulse (!) 48   Resp 20   Ht 5\' 10"  (1.778 m)   Wt 210 lb (95.3 kg)   SpO2 91% Comment: RA  BMI 30.13 kg/m              General:                      Mildly obese,  well-appearing             HEENT:                       Unremarkable              Neck:                           no JVD, no bruits, no adenopathy              Chest:                          clear to auscultation, symmetrical breath sounds, no wheezes, no rhonchi              CV:                              RRR, grade grade II-III/VI crescendo/decrescendo murmur heard best at RUSB,  no diastolic murmur             Abdomen:                    soft, non-tender, no masses  Extremities:                 warm, well-perfused, pulses diminished but palpable, no LE edema              Rectal/GU                   Deferred             Neuro:                         Grossly non-focal and symmetrical throughout             Skin:                            Clean and dry, no rashes, no breakdown   Diagnostic Tests:  Transthoracic Echocardiography  Patient: Fawzi, Lavinder MR #: [id number] Study Date: 11/20/2015 Gender: M Age: 33 Height: 177.8 cm Weight: 97.3 kg BSA: 2.22 m^2 Pt. Status: Room:  ORDERING Peter [name], M.D. REFERRING Peter [name], M.D. SONOGRAPHER Cindy Hazy, RDCS ATTENDING Ena Dawley, M.D. PERFORMING [facility], Outpatient  cc:  ------------------------------------------------------------------- LV EF: 35% - 40%  ------------------------------------------------------------------- Indications: I35.9 Aortic Valve Disorder.  ------------------------------------------------------------------- History: PMH: Acquired from the patient and from the patient&'s chart. PMH: Ischemic Cardiomyopathy. Chronic Systolic Heart Failure. Obstructive Sleep Apnea. LBBB. PVC&'s. CAD.  ------------------------------------------------------------------- Study Conclusions  - Left ventricle: The cavity size was normal. Wall thickness was increased in a pattern of mild LVH. Anterolateral hypokinesis, inferolateral severe hypokinesis. Systolic function was moderately reduced. The estimated ejection fraction was in the range of 35% to 40%. Doppler parameters are consistent with abnormal left ventricular relaxation (grade 1 diastolic dysfunction). - Aortic valve: Trileaflet; severely calcified leaflets. Moderate to severe aortic stenosis. Moderate by mean gradient, severe by calculated valve area. Mean gradient (S): 31 mm Hg. Peak gradient (S): 46 mm Hg. Valve area (VTI): 0.8 cm^2. - Mitral valve: There was trivial regurgitation. - Left atrium: The atrium  was mildly dilated. - Right ventricle: The cavity size was normal. Pacer wire or catheter noted in right ventricle. Systolic function was mildly reduced. - Tricuspid valve: Peak RV-RA gradient (S): 17 mm Hg. - Systemic veins: IVC not visualized.  Impressions:  - Normal LV size with mild LV hypertrophy. EF 35-40% with anterolateral hypokinesis, inferolateral severe hypokinesis. Normal RV size with mildly decreased systolic function. Moderate to severe aortic stenosis. Moderate by mean gradient, severe by calculated valve area. With low EF, possible low gradient severe AS. Would consider low dose dobutamine stress echo.  ------------------------------------------------------------------- Labs, prior tests, procedures, and surgery: ICD. Coronary artery bypass grafting.  ------------------------------------------------------------------- Study data: Study status: Routine. Procedure: The patient reported no pain pre or post test. Transthoracic echocardiography for left ventricular function evaluation, for right ventricular function evaluation, and for assessment of valvular function. Image quality was adequate. Study completion: There were no complications. Transthoracic echocardiography. M-mode, complete 2D, spectral Doppler, and color Doppler. Birthdate: Patient birthdate: 1946-01-03. Age: Patient is 71 yr old. Sex: Gender: male. BMI: 30.8 kg/m^2. Blood pressure: 94/68 Patient status: Outpatient. Study date: Study date: 11/20/2015. Study time: 07:54 AM. Location: [facility]  -------------------------------------------------------------------  ------------------------------------------------------------------- Left ventricle: The cavity size was normal. Wall thickness was increased in a pattern of mild LVH. Anterolateral hypokinesis, inferolateral severe hypokinesis. Systolic function was moderately reduced. The  estimated ejection fraction was in the range of 35% to 40%. Doppler parameters are  consistent with abnormal left ventricular relaxation (grade 1 diastolic dysfunction).  ------------------------------------------------------------------- Aortic valve: Trileaflet; severely calcified leaflets. Moderate to severe aortic stenosis. Moderate by mean gradient, severe by calculated valve area. Doppler: There was no regurgitation. VTI ratio of LVOT to aortic valve: 0.23. Valve area (VTI): 0.8 cm^2. Indexed valve area (VTI): 0.36 cm^2/m^2. Peak velocity ratio of LVOT to aortic valve: 0.23. Valve area (Vmax): 0.81 cm^2. Indexed valve area (Vmax): 0.37 cm^2/m^2. Mean velocity ratio of LVOT to aortic valve: 0.24. Valve area (Vmean): 0.82 cm^2. Indexed valve area (Vmean): 0.37 cm^2/m^2. Mean gradient (S): 31 mm Hg. Peak gradient (S): 46 mm Hg.  ------------------------------------------------------------------- Aorta: Aortic root: The aortic root was normal in size. Ascending aorta: The ascending aorta was normal in size.  ------------------------------------------------------------------- Mitral valve: Mildly calcified leaflets . Doppler: There was no evidence for stenosis. There was trivial regurgitation.  ------------------------------------------------------------------- Left atrium: The atrium was mildly dilated.  ------------------------------------------------------------------- Right ventricle: The cavity size was normal. Pacer wire or catheter noted in right ventricle. Systolic function was mildly reduced.  ------------------------------------------------------------------- Pulmonic valve: Structurally normal valve. Cusp separation was normal. Doppler: Transvalvular velocity was within the normal range. There was no regurgitation.  ------------------------------------------------------------------- Tricuspid valve: Doppler: There was trivial  regurgitation.  ------------------------------------------------------------------- Right atrium: The atrium was normal in size.  ------------------------------------------------------------------- Pericardium: There was no pericardial effusion.  ------------------------------------------------------------------- Systemic veins: IVC not visualized.  ------------------------------------------------------------------- Measurements  Left ventricle Value Reference LV ID, ED, PLAX chordal (H) 54.2 mm 43 - 52 LV ID, ES, PLAX chordal (H) 39.3 mm 23 - 38 LV fx shortening, PLAX chordal (L) 27 % >=29 LV PW thickness, ED 14.2 mm --------- IVS/LV PW ratio, ED 0.95 <=1.3 Stroke volume, 2D 66 ml --------- Stroke volume/bsa, 2D 30 ml/m^2 --------- LV e&', lateral 3.6 cm/s --------- LV E/e&', lateral 12.81 --------- LV e&', medial 4.91 cm/s --------- LV E/e&', medial 9.39 --------- LV e&', average 4.26 cm/s --------- LV E/e&', average 10.83 ---------  Ventricular septum Value Reference IVS thickness, ED 13.5 mm ---------  LVOT Value Reference LVOT ID, S 21 mm --------- LVOT area 3.46 cm^2 --------- LVOT ID 21 mm --------- LVOT peak velocity, S 79 cm/s --------- LVOT mean velocity, S  58.8 cm/s --------- LVOT VTI, S 19 cm --------- LVOT peak gradient, S 2 mm Hg --------- Stroke volume (SV), LVOT DP 65.8 ml --------- Stroke index (SV/bsa), LVOT DP 29.7 ml/m^2 ---------  Aortic valve Value Reference Aortic valve peak velocity, S 338 cm/s --------- Aortic valve mean velocity, S 249 cm/s --------- Aortic valve VTI, S 81.8 cm --------- Aortic mean gradient, S 27 mm Hg --------- Aortic peak gradient, S 46 mm Hg --------- VTI ratio, LVOT/AV 0.23 --------- Aortic valve area, VTI 0.8 cm^2 --------- Aortic valve area/bsa, VTI 0.36 cm^2/m^2 --------- Velocity ratio, peak, LVOT/AV 0.23 --------- Aortic valve area, peak velocity 0.81 cm^2 --------- Aortic valve area/bsa, peak 0.37 cm^2/m^2 --------- velocity Velocity ratio, mean, LVOT/AV 0.24 --------- Aortic valve area, mean velocity 0.82 cm^2 --------- Aortic valve area/bsa, mean 0.37 cm^2/m^2 --------- velocity  Aorta Value Reference Aortic root ID, ED 36 mm --------- Ascending aorta ID, A-P, S 33 mm ---------  Left atrium Value Reference LA ID, A-P, ES 56 mm --------- LA ID/bsa, A-P (H) 2.52 cm/m^2 <=2.2 LA volume, S 82 ml --------- LA volume/bsa, S 37 ml/m^2 --------- LA volume,  ES, 1-p A4C 89 ml --------- LA volume/bsa, ES, 1-p A4C 40.1 ml/m^2 --------- LA volume, ES, 1-p A2C 75 ml --------- LA volume/bsa, ES, 1-p A2C 33.8 ml/m^2 ---------  Mitral valve Value Reference Mitral E-wave peak velocity 46.1 cm/s --------- Mitral A-wave peak velocity 90.5 cm/s --------- Mitral deceleration time (H) 356 ms 150 - 230 Mitral E/A ratio, peak 0.5 ---------  Tricuspid valve Value Reference Tricuspid regurg peak velocity 206 cm/s --------- Tricuspid peak RV-RA gradient 17 mm Hg ---------  Right ventricle Value Reference RV s&', lateral, S 10.2 cm/s ---------  Legend: (L) and (H) mark values outside specified reference range.  ------------------------------------------------------------------- Prepared and Electronically Authenticated by  Loralie Champagne, M.D. 2017-11-08T16:11:18   Stress Echocardiography  Patient: Thomas, Buitrago MR #: [id number] Study Date: 12/19/2015 Gender: M Age: 64 Height: 177.8 cm Weight: 97.3 kg BSA: 2.22 m^2 Pt. Status: Room:  ATTENDING Peter [name], M.D. ORDERING Peter [name], M.D. REFERRING Peter [name], M.D. SONOGRAPHER Wyatt Mage, RDCS PERFORMING [facility], Outpatient  cc:  -------------------------------------------------------------------  ------------------------------------------------------------------- Indications: Aortic stenosis (I35).  ------------------------------------------------------------------- Study Conclusions  - Baseline ECG: Normal sinus rhythm with Ventricular  pacing. - Stress ECG conclusions: There were no stress arrhythmias or conduction abnormalities. The stress ECG was non-diagnostic due to paced rhythm. - Staged echo: There was no echocardiographic evidence for stress-induced ischemia. - Impressions: Severe aortic stenosis at peak dobutamine infusion with mean AV gradient of 45mmHg. Moderate LV dysfunction with resting EF 34-40% with severe inferior, inferolateral and lateral wall hypokinesis . After peak dobutamine infusion there the EF increased slightly to 40-45% with persistence of severe hypokinesis of the inferior, inferolateral and lateral walls.  Impressions:  - Severe aortic stenosis at peak dobutamine infusion with mean AV gradient of 40mmHg. Moderate LV dysfunction with resting EF 34-40% with severe inferior, inferolateral and lateral wall hypokinesis After peak dobutamine infusion there the EF increased slightly to 40-45% with persistence of severe hypokinesis of the inferior, inferolateral and lateral walls.  ------------------------------------------------------------------- Study data: Study status: Routine. Consent: The risks, benefits, and alternatives to the procedure were explained to the patient and informed consent was obtained. Procedure: The patient reported no pain pre or post test. Initial setup. The patient was brought to the laboratory. A baseline ECG was recorded. Surface ECG leads and automatic cuff blood pressure measurements were monitored. Dobutamine stress test. Stress testing was performed, with dobutamine infusion from 5 to 20 mcg/kg/min. The infusion was terminated due to maximal dose administration. Transthoracic stress echocardiography for assessment of valvular function. Images were captured at baseline, low dose, peak dose, and recovery. Study completion: The patient tolerated the procedure well. There were no complications. Dobutamine. Stress  echocardiography. 2D. Birthdate: Patient birthdate: October 31, 1945. Age: Patient is 71 yr old. Sex: Gender: male. BMI: 30.8 kg/m^2. Patient status: Outpatient. Study date: Study date: 12/19/2015. Study time: 07:19 AM.  -------------------------------------------------------------------  ------------------------------------------------------------------- Aortic valve: Doppler: VTI ratio of LVOT to aortic valve: 0.3. Valve area (VTI): 0.95 cm^2. Indexed valve area (VTI): 0.43 cm^2/m^2. Peak velocity ratio of LVOT to aortic valve: 0.29. Valve area (Vmax): 0.91 cm^2. Indexed valve area (Vmax): 0.41 cm^2/m^2. Mean velocity ratio of LVOT to aortic valve: 0.26. Valve area (Vmean): 0.82 cm^2. Indexed valve area (Vmean): 0.37 cm^2/m^2. Mean gradient (S): 27 mm Hg. Peak gradient (S): 51 mm Hg.  ------------------------------------------------------------------- Baseline ECG: Normal sinus rhythm with Ventricular pacing. Normal.  ------------------------------------------------------------------- Stress protocol:  +-----------------------+---+------------+--------+ !Stage !HR !BP (mmHg) !Symptoms! +-----------------------+---+------------+--------+ !Baseline !83 !141/97 (112)!None ! +-----------------------+---+------------+--------+ !Dobutamine 5 ug/kg/min !64 !------------!--------! +-----------------------+---+------------+--------+ !Dobutamine 10 ug/kg/min!59 !143/83 (103)!--------! +-----------------------+---+------------+--------+ !Dobutamine 20 ug/kg/min!114!132/75 (94) !--------! +-----------------------+---+------------+--------+ !Immediate post stress !114!------------!--------! +-----------------------+---+------------+--------+ !Recovery; 1 min !120!137/77 (97) !--------! +-----------------------+---+------------+--------+ !Recovery; 2 min  !104!------------!--------! +-----------------------+---+------------+--------+ !Recovery; 3 min !  114!------------!--------! +-----------------------+---+------------+--------+ !Recovery; 4 min !81 !158/91 (113)!--------! +-----------------------+---+------------+--------+ !Recovery; 5 min !64 !------------!--------! +-----------------------+---+------------+--------+ !Recovery; 6 min !69 !158/92 (114)!--------! +-----------------------+---+------------+--------+  ------------------------------------------------------------------- Stress results: Maximal heart rate during stress was 120 bpm (81% of maximal predicted heart rate). The maximal predicted heart rate was 148 bpm.The target heart rate was achieved. The heart rate response to stress was normal. There was a normal resting blood pressure with an appropriate response to stress. Normal blood pressure response to dobutamine. The rate-pressure product for the peak heart rate and blood pressure was 16440 mm Hg/min. The patient experienced no chest pain during stress.  ------------------------------------------------------------------- Stress ECG: There were no stress arrhythmias or conduction abnormalities. Isolated ventricular ectopy and ventricular couplets The stress ECG was non-diagnostic due to paced rhythm.  ------------------------------------------------------------------- Baseline:  - The estimated LV ejection fraction was 35-40%. - Hypokinesis of the lateral, inferolateral, and inferior LV myocardium.  Aortic valve: Mean gradient: 33 mm Hg. Valve area: 0.58 cm^2. Low dose: Aortic valve: Mean gradient: 29 mm Hg. Valve area: 0.8 cm^2. Peak stress:  - The estimated LV ejection fraction was 40-45%. - Hypokinesis of the lateral, inferolateral, and inferior LV myocardium.  Aortic valve: Mean gradient: 41 mm Hg. Valve area: 1.23 cm^2. Recovery: Aortic valve: Mean  gradient: 28 mm Hg. Valve area: 0.96 cm^2.  ------------------------------------------------------------------- Stress echo results: There was no echocardiographic evidence for stress-induced ischemia.  ------------------------------------------------------------------- Measurements  Left ventricle Value Stroke volume, 2D 67 ml Stroke volume/bsa, 2D 30 ml/m^2  LVOT Value LVOT ID, S 20 mm LVOT area 3.14 cm^2 LVOT peak velocity, S 104 cm/s LVOT mean velocity, S 62.1 cm/s LVOT VTI, S 21.3 cm LVOT peak gradient, S 4 mm Hg  Aortic valve Value Aortic valve peak velocity, S 358 cm/s Aortic valve mean velocity, S 239 cm/s Aortic valve VTI, S 70.2 cm Aortic mean gradient, S 27 mm Hg Aortic peak gradient, S 51 mm Hg VTI ratio, LVOT/AV 0.3 Aortic valve area, VTI 0.95 cm^2 Aortic valve area/bsa, VTI 0.43 cm^2/m^2 Velocity ratio, peak, LVOT/AV 0.29 Aortic valve area, peak velocity 0.91 cm^2 Aortic valve area/bsa, peak velocity 0.41 cm^2/m^2 Velocity ratio, mean, LVOT/AV 0.26 Aortic valve area, mean velocity 0.82 cm^2 Aortic valve area/bsa, mean velocity 0.37 cm^2/m^2  Legend: (L) and (H) mark values outside specified reference range.  ------------------------------------------------------------------- Prepared and Electronically Authenticated by  Fransico Him, MD 2017-12-08T15:43:14     Right/Left Heart Cath and Coronary/Graft Angiography  Conclusion      LV end diastolic pressure is normal.  There is moderate aortic valve stenosis.  Ost LM lesion, 100 %stenosed.  Prox Cx to Mid Cx lesion, 70 %stenosed.  Mid Cx lesion, 80 %stenosed.  Ost LAD to Prox LAD lesion, 100 %stenosed.  Ost RCA lesion, 99 %stenosed.  Prox RCA lesion, 100 %stenosed.  LIMA graft was visualized by angiography and is normal in caliber and anatomically normal.  Seq SVG- graft was visualized by angiography and is normal in caliber and anatomically normal.  1st Mrg-2 lesion, 90 %stenosed.  1st Mrg-1 lesion, 99 %stenosed.  Dist Graft lesion, 0 %stenosed.  Seq SVG- graft was visualized by angiography and is normal in caliber and anatomically normal.  Origin lesion, 30 %stenosed.  LV end diastolic pressure is normal.  1. Severe 3 vessel and left main occlusive CAD 2. Patent LIMA to the LAD and first diagonal 3. Patent SVG to OM1 and OM2. The first OM is tiny and diffusely diseased. The stent in the mid SVG is widely patent 4. Patent SVG to  PDA and PLOM 5. Moderate Aortic stenosis by cath but Severe AS by Dobutamine stress Echo c/w low gradient severe AS 6. Normal right heart pressures. 7. Normal LV filling pressures 8. Normal cardiac output.  Plan: consider for AVR/TAVR.   Indications   Nonrheumatic aortic valve stenosis I35.0 (ICD-10-CM)  Procedural Details/Technique   Technical Details Indication: 71 yo WM with history of CAD s/p CABG in 1996, s/p stenting of SVG to OM in 2016 presents for evaluation of low gradient severe AS. EF 30-35% by Echo.  Procedural Details: The right groin was prepped, draped, and anesthetized with 1% lidocaine. Using the modified Seldinger technique a 5 Fr sheath was placed in the right femoral artery and a 7 French sheath was placed in the right femoral vein. A Swan-Ganz catheter was used for the right heart catheterization. Standard protocol was followed for recording of right heart pressures and sampling of  oxygen saturations. Fick cardiac output was calculated. Standard Judkins catheters were used for selective coronary and graft angiography and left ventricular pressures. There were no immediate procedural complications. The patient was transferred to the post catheterization recovery area for further monitoring.  Contrast: 100 cc   Estimated blood loss <50 mL.  During this procedure the patient was administered the following to achieve and maintain moderate conscious sedation: Versed 2 mg, Fentanyl 25 mcg, while the patient's heart rate, blood pressure, and oxygen saturation were continuously monitored. The period of conscious sedation was 39 minutes, of which I was present face-to-face 100% of this time.    Complications   Complications documented before study signed (01/22/2016 9:34 AM EST)    No complications were associated with this study.  Documented by [name] [name], MD - 01/22/2016 9:33 AM EST    Coronary Findings   Dominance: Right  Left Main  Ost LM lesion, 100% stenosed.  Left Anterior Descending  Ost LAD to Prox LAD lesion, 100% stenosed.  Left Circumflex  Prox Cx to Mid Cx lesion, 70% stenosed.  Mid Cx lesion, 80% stenosed.  First Obtuse Marginal Branch  Vessel is small in size.  1st Mrg-1 lesion, 99% stenosed.  1st Mrg-2 lesion, 90% stenosed.  Right Coronary Artery  Ost RCA lesion, 99% stenosed.  Prox RCA lesion, 100% stenosed.  Graft Angiography  Free LIMA Graft to 1st Diag, Mid LAD  LIMA graft was visualized by angiography and is normal in caliber and anatomically normal.  Sequential jump graft Graft to 1st Mrg, 2nd Mrg  Seq SVG- graft was visualized by angiography and is normal in caliber and anatomically normal.  Dist Graft lesion before 1st Mrg, 0% stenosed. Previously placed Dist Graft drug eluting stent before 1st Mrg is widely patent.  Sequential jump graft Graft to RPDA, Dist RCA  Seq SVG- graft was visualized by angiography and is normal in caliber  and anatomically normal.  Origin lesion before RPDA, 30% stenosed.  Right Heart   Right Heart Pressures LV EDP is normal. Normal right heart pressures.    Left Heart   Left Ventricle LV end diastolic pressure is normal.    Aortic Valve There is moderate aortic valve stenosis. The aortic valve is calcified.    Coronary Diagrams   Diagnostic Diagram     Implants        No implant documentation for this case.  PACS Images   Show images for Cardiac catheterization   Link to Procedure Log   Procedure Log    Hemo Data   Flowsheet Row Most Recent  Value  Fick Cardiac Output 4.4 L/min  Fick Cardiac Output Index 2.11 (L/min)/BSA  Aortic Mean Gradient 20.8 mmHg  Aortic Peak Gradient 27 mmHg  Aortic Valve Area 1.29  Aortic Value Area Index 0.62 cm2/BSA  RA A Wave 7 mmHg  RA V Wave 6 mmHg  RA Mean 4 mmHg  RV Systolic Pressure 31 mmHg  RV Diastolic Pressure 2 mmHg  RV EDP 7 mmHg  PA Systolic Pressure 30 mmHg  PA Diastolic Pressure 10 mmHg  PA Mean 18 mmHg  PW A Wave 19 mmHg  PW V Wave 20 mmHg  PW Mean 11 mmHg  AO Systolic Pressure 123456 mmHg  AO Diastolic Pressure 82 mmHg  AO Mean 0000000 mmHg  LV Systolic Pressure 0000000 mmHg  LV Diastolic Pressure 10 mmHg  LV EDP 28 mmHg  Arterial Occlusion Pressure Extended Systolic Pressure [id number] mmHg  Arterial Occlusion Pressure Extended Diastolic Pressure 80 mmHg  Arterial Occlusion Pressure Extended Mean Pressure 112 mmHg  Left Ventricular Apex Extended Systolic Pressure 0000000 mmHg  Left Ventricular Apex Extended Diastolic Pressure 6 mmHg  Left Ventricular Apex Extended EDP Pressure 25 mmHg  QP/QS 1  TPVR Index 8.54 HRUI  TSVR Index 51.73 HRUI  PVR SVR Ratio 0.07  TPVR/TSVR Ratio 0.17    Cardiac TAVR CT  TECHNIQUE: The patient was scanned on a Philips 256 scanner. A 120 kV retrospective scan was triggered in the descending thoracic aorta at 111 HU's. Gantry rotation speed was 270 msecs and collimation was .9 mm. No  beta blockade or nitro were given. The 3D data set was reconstructed in 5% intervals of the R-R cycle. Systolic and diastolic phases were analyzed on a dedicated work station using MPR, MIP and VRT modes. The patient received 80 cc of contrast.  FINDINGS: Aortic Valve: Trileaflet and calcified with restricted motion.  Aorta: Moderate calcification of the STJ ascending root, arch and descending thoracic aorta. No aneurysm or heavy plaque. Normal take off of arch vessels  Sinotubular Junction: 25 mm  Ascending Thoracic Aorta: 34 mm  Aortic Arch: 25 mm  Descending Thoracic Aorta: 27 mm  Sinus of Valsalva Measurements:  Non-coronary: 33 mm  Right -coronary: 31 mm  Left -coronary: 31 mm  Coronary Artery Height above Annulus:  Left Main: 15 mm above annulus  Right Coronary: 12.5 mm above annulus  Virtual Basal Annulus Measurements:  Maximum/Minimum Diameter: 28.7 mm x 20.5 mm  Perimeter: 82 mm  Area: 480 cm2  Coronary Arteries: Sufficient height above annulus but native arteries known to be occluded. Patent SVG to RCA and OM, Patent LIMA to LAD  Optimum Fluoroscopic Angle for Delivery: LAO 22 degrees Cr 3 degrees  IMPRESSION: 1) Calcified trileaflet aortic valve with annulus suitable for a 26 mm Sapien 3 valve  2) Native coronaries sufficient height for delivery with patient SVG to RCA, OM and patent LIMA to LAD  3) Calcified ascending aorta, STJ and thoracic aorta with no aneurysm  4) Optimum angle for delivery LAO 22 degrees Cranial 3 degrees  5) Biventricular AICD wires in place  [employer]  Electronically Signed: By: Jenkins Rouge M.D. On: 02/03/2016 14:00   STS Risk Calculator  Procedure                                          AVR + redo CABG  Risk of Mortality  4.1% Morbidity or Mortality                       22.7% Prolonged LOS                                    9.9% Short LOS                                           28.2% Permanent Stroke                             1.8% Prolonged Vent Support                     16.8% DSW Infection                                     0.6% Renal Failure                                       5.2% Reoperation                                        10.0%    Impression:  Patient has stage D severe symptomatic low gradient low ejection fraction aortic stenosis and severe coronary artery disease status post coronary artery bypass grafting in the remote past. He presents with persistent symptoms of chest pain that are somewhat atypical, occur both with activity or at rest and possibly related to GE reflux disease, but are typically relieved with sublingual nitroglycerin, consistent with angina pectoris.  The patient also describes stable mild symptoms of exertional shortness of breath consistent with chronic combined systolic and diastolic heart failure, New [place] Heart Association functional class 1-2. I have personally reviewed the patient's recent transthoracic echocardiogram, dobutamine stress echocardiogram, diagnostic cardiac catheterization, and CT angiograms. Transthoracic echocardiogram reveals moderate left ventricular systolic dysfunction with ejection fraction estimated 35%. The aortic valve is trileaflet with severe calcification, leaflet thickening, and decreased mobility involving all 3 leaflets of the aortic valve. Peak velocity measured across the aortic valve ranged as high as 3.4 m/s but increased to greater than 4.0 m/s during dobutamine stress echocardiography.  Diagnostic cardiac catheterization demonstrates severe native coronary artery disease with essentially 100% occlusion of all of the proximal native coronaries. There is continued patency of all of the patient's previously placed bypass grafts with exception of the first obtuse marginal branch of the left circumflex coronary artery. Catheterization is  also notable for the presence of extensive calcification in the aortic root and proximal ascending thoracic aorta.  I agree the patient would likely benefit from aortic valve replacement. Risks associated with conventional surgical aortic valve replacement with or without redo coronary artery bypass grafting would be at least moderately elevated, and the degree of calcification in the proximal ascending thoracic aorta would likely increase risks of surgery considerably.  Cardiac gated CT angiogram of the heart reveals findings consistent with severe aortic stenosis with anatomical characteristics suitable  for transcatheter aortic valve replacement without any significant complicating features. CT angiogram of the aorta and iliac vessels was performed yesterday but has not yet been reviewed by a radiologist. The patient appears to have adequate pelvic vascular access to facilitate a transfemoral approach for transcatheter aortic valve replacement.   Plan:  The patient and his wife were counseled at length regarding treatment alternatives for management of severe symptomatic aortic stenosis. Alternative approaches such as conventional aortic valve replacement with or without redo coronary artery bypass grafting, transcatheter aortic valve replacement, and palliative medical therapy were compared and contrasted at length.  The risks associated with conventional surgical aortic valve replacement were been discussed in detail, as were expectations for post-operative convalescence. Long-term prognosis with medical therapy was discussed. This discussion was placed in the context of the patient's own specific clinical presentation and past medical history.  All of their questions have been addressed.  The patient is not interested in considering conventional surgery but is very interested in the possibility of proceeding with transcatheter aortic valve replacement in the near future.  Although he does not report any  ongoing dental problems he admits that he does not have good teeth and he has not seen a dentist in many, many years. He probably should have a dental examination performed prior to surgery.  We tentatively plan to proceed with transcatheter aortic valve replacement on Tuesday, 02/18/2016.  The patient has been instructed to make an effort to check his blood pressure at home if possible, particularly his dizzy spells persist. If so I have suggested that he may want to temporarily stop taking Imdur and notify Dr. [name] if his blood pressure remains low and/or he continues to have dizzy spells.  Following the decision to proceed with transcatheter aortic valve replacement, a discussion has been held regarding what types of management strategies would be attempted intraoperatively in the event of life-threatening complications, including whether or not the patient would be considered a candidate for the use of cardiopulmonary bypass and/or conversion to open sternotomy for attempted surgical intervention.  The patient has been advised of a variety of complications that might develop including but not limited to risks of death, stroke, paravalvular leak, aortic dissection or other major vascular complications, aortic annulus rupture, device embolization, cardiac rupture or perforation, mitral regurgitation, acute myocardial infarction, arrhythmia, heart block or bradycardia requiring permanent pacemaker placement, congestive heart failure, respiratory failure, renal failure, pneumonia, infection, other late complications related to structural valve deterioration or migration, or other complications that might ultimately cause a temporary or permanent loss of functional independence or other long term morbidity.  The patient provides full informed consent for the procedure as described and all questions were answered.   I spent in excess of 90 minutes during the conduct of this office consultation and >50% of  this time involved direct face-to-face encounter with the patient for counseling and/or coordination of their care.    Valentina Gu. Roxy Manns, MD 02/04/2016 1:09 PM      Electronically signed by Rexene Alberts, MD at 02/04/2016 2:05 PM Electronically signed by Rexene Alberts, MD at 02/04/2016 2:23 PM

## 2016-02-07 NOTE — Anesthesia Postprocedure Evaluation (Addendum)
Anesthesia Post Note  Patient: [name]  Procedure(s) Performed: Procedure(s) (LRB): Extraction of tooth #'s 1,2,3,14,15, 18, 20, 29,30,31 with alveoloplasty and dental cleaning of teeth. (N/A)  Patient location during evaluation: PACU Anesthesia Type: General Level of consciousness: sedated Pain management: pain level controlled Vital Signs Assessment: post-procedure vital signs reviewed and stable Respiratory status: spontaneous breathing and respiratory function stable Cardiovascular status: stable Anesthetic complications: no       Last Vitals:  Vitals:   02/07/16 1117 02/07/16 1133  BP: (!) 176/96 (!) 179/94  Pulse: 74 74  Resp: 18 16  Temp:      Last Pain:  Vitals:   02/07/16 1005  TempSrc:   PainSc: 0-No pain                 [name]

## 2016-02-07 NOTE — Discharge Instructions (Signed)

## 2016-02-08 ENCOUNTER — Encounter (HOSPITAL_COMMUNITY): Payer: Self-pay | Admitting: Dentistry

## 2016-02-10 ENCOUNTER — Ambulatory Visit: Payer: Medicare Other | Admitting: Cardiology

## 2016-02-11 ENCOUNTER — Ambulatory Visit: Payer: Medicare Other | Attending: Thoracic Surgery (Cardiothoracic Vascular Surgery) | Admitting: Physical Therapy

## 2016-02-11 ENCOUNTER — Encounter: Payer: Self-pay | Admitting: Surgery

## 2016-02-11 ENCOUNTER — Institutional Professional Consult (permissible substitution) (INDEPENDENT_AMBULATORY_CARE_PROVIDER_SITE_OTHER): Payer: Medicare Other | Admitting: Surgery

## 2016-02-11 ENCOUNTER — Encounter: Payer: Self-pay | Admitting: Physical Therapy

## 2016-02-11 VITALS — BP 110/60 | HR 63 | Resp 16 | Ht 70.0 in | Wt 210.0 lb

## 2016-02-11 DIAGNOSIS — I25729 Atherosclerosis of autologous artery coronary artery bypass graft(s) with unspecified angina pectoris: Secondary | ICD-10-CM

## 2016-02-11 DIAGNOSIS — R293 Abnormal posture: Secondary | ICD-10-CM | POA: Diagnosis present

## 2016-02-11 DIAGNOSIS — Z951 Presence of aortocoronary bypass graft: Secondary | ICD-10-CM

## 2016-02-11 DIAGNOSIS — R2689 Other abnormalities of gait and mobility: Secondary | ICD-10-CM | POA: Diagnosis present

## 2016-02-11 DIAGNOSIS — I350 Nonrheumatic aortic (valve) stenosis: Secondary | ICD-10-CM | POA: Diagnosis not present

## 2016-02-11 NOTE — Progress Notes (Signed)
Patient ID: [name], male   DOB: 1945/02/03, 71 y.o.   MRN: [id number]  [facility] CONSULTATION REPORT  Referring Provider is [name], [name], MD PCP is Pcp Not In System  Chief Complaint  Patient presents with  . Follow-up    2ND TAVR EVAL    HPI:  The patient is a 71 year old gentleman with a history of CABG x 7 by Dr. Redmond Pulling in 1996, SVG stenosis in a LCX graft treated with DES in 2016, aortic stenosis, chronic systolic and diastolic CHF with an EF of 35-40% by recent echo, reflux with Barrett's esophagus, OSA, hypertension and hyperlipidemia. He has been followed by Dr. [name] and has had mild exertional shortness of breath, fatigue, dizziness with bending over and substernal chest discomfort when lying down at night. In December 2016 the patient had a syncopal episode. An echocardiogram at that time showed an ejection fraction of 30-35% with moderate to severe aortic stenosis. Mean transvalvular gradient was reported to be 24 mmHg with an aortic valve area calculated at 0.8-0.9. He was noted to have left bundle branch block and underwent placement of a biventricular pacemaker ICD. A follow-up transthoracic echocardiogram performed 11/20/2015 showed an increased gradient across the aortic valve with peak velocity of 3.4 m/s corresponding to a mean transvalvular gradient estimated between 27and 31 mmHg.  The peak velocity ratio was reported at 0.23. Left ventricular ejection fraction was  35%. A dobutamine stress echocardiography showed the presence of low gradient, low EF severe aortic stenosis with increased ejection fraction and increased transvalvular gradient estimated at 41 mmHg at peak dobutamine infusion.  The patient subsequently underwent left and right heart catheterization on 01/22/2016 showing severe native coronary artery disease with 100% chronic occlusion of  all of the proximal coronary arteries.  There is continued patency of all the previously placed bypass grafts, including the stented segment of the vein graft to the left circumflex system. The peak to peak and mean transvalvular gradient across the aortic valve measured 27 and 20.8 mmHg, respectively.  The aortic valve area was calculated at 1.29 cm2.  Pulmonary artery pressures were normal. He underwent dental evaluation by Dr. Enrique Sack and had to have 10 teeth extracted.  Past Medical History:  Diagnosis Date  . Aortic stenosis   . Barrett's esophageal ulceration   . Chronic systolic CHF (congestive heart failure) ([place])   . Coronary artery disease    a. CABG 1996: LIMA graft to the LAD and diagonal, sequential vein graft to the acute marginal, PDA, and posterior lateral branches of the right coronary, sequential saphenous vein graft to the obtuse marginal vessel and distal circumflex. b. Cath 2007 patent grafts. c. s/p DES to SVG-OM2 in 03/2014.  Marland Kitchen Coronary artery disease involving autologous vein coronary bypass graft with angina pectoris ([place]) 03/13/2014   PCI using DES in SVG to LCx system  . Dental caries    pre-heart valve surgery protocol  . Gastroesophageal reflux disease   . History of erectile dysfunction   . History of hiatal hernia   . History of kidney stones   . History of obesity   . History of prostatitis   . Hypercholesterolemia   . Hypertension   . Kidney stones   . LBBB (left bundle branch block)   . Nodule of kidney 02/04/2016   Incidental 60mm nodule upper pole right kidney noted on CT angiogram - MRI with and without gadolinium contrast recommened  in 6 months  . OSA (obstructive sleep apnea)    "suppose to wear mask; I don't" (03/13/2014)  . PVC's (premature ventricular contractions)   . S/P CABG x 7 01/07/1995   LIMA to LAD-diagonal, SVG to OM-LCx, SVG to AM-PD-RPL    Past Surgical History:  Procedure Laterality Date  . [place]; ~ 2006   ; Ejection fraction is estimated at 45%    . CARDIAC CATHETERIZATION N/A 01/22/2016   Procedure: Right/Left Heart Cath and Coronary/Graft Angiography;  Surgeon: [name] [name], MD;  Location: [facility];  Service: Cardiovascular;  Laterality: N/A;  . CHOLECYSTECTOMY N/A 01/23/2014   Procedure: LAPAROSCOPIC CHOLECYSTECTOMY WITH INTRAOPERATIVE CHOLANGIOGRAM ;  Surgeon: Fanny Skates, MD;  Location: [facility];  Service: General;  Laterality: N/A;  . COLONOSCOPY W/ POLYPECTOMY    . CORONARY ANGIOPLASTY WITH STENT PLACEMENT  03/13/2014   "1"  . CORONARY ARTERY BYPASS GRAFT  1996   "CABG X 7"  . CYSTOSCOPY WITH STENT PLACEMENT    . EP IMPLANTABLE DEVICE N/A 12/17/2014   Procedure: BiV ICD Insertion CRT-D;  Surgeon: Evans Lance, MD;  Location: [facility];  Service: Cardiovascular;  Laterality: N/A;  . LEFT HEART CATHETERIZATION WITH CORONARY/GRAFT ANGIOGRAM N/A 03/13/2014   Procedure: LEFT HEART CATHETERIZATION WITH Beatrix Fetters;  Surgeon: [name] [name], MD;  Location: [facility] C[facility];  Service: Cardiovascular;  Laterality: N/A;  . MULTIPLE EXTRACTIONS WITH ALVEOLOPLASTY N/A 02/07/2016   Procedure: Extraction of tooth #'s 1,2,3,14,15, 18, 20, 29,30,31 with alveoloplasty and dental cleaning of teeth.;  Surgeon: Lenn Cal, DDS;  Location: [facility];  Service: Oral Surgery;  Laterality: N/A;  . TONSILLECTOMY  ~ 1950    Family History  Problem Relation Age of Onset  . Heart attack Father   . Cancer Sister     Uterine Cancer    Social History   Social History  . Marital status: Married    Spouse name: N/A  . Number of children: 3  . Years of education: N/A   Occupational History  . sales Unemployed    retired   Social History Main Topics  . Smoking status: Former Smoker    Packs/day: 0.75    Years: 15.00    Types: Cigarettes    Quit date: 01/12/1997  . Smokeless tobacco: Never Used     Comment: 3/4 pack per day. smoked 15 years off and on  . Alcohol use No  . Drug use: No  . Sexual activity: Yes    Other Topics Concern  . Not on file   Social History Narrative  . No narrative on file    Current Outpatient Prescriptions  Medication Sig Dispense Refill  . Ascorbic Acid (VITAMIN C) 1000 MG tablet Take 1,000 mg by mouth daily.    Marland Kitchen aspirin EC 81 MG EC tablet Take 1 tablet (81 mg total) by mouth daily.    Marland Kitchen atorvastatin (LIPITOR) 80 MG tablet Take 80 mg by mouth daily.    . Cholecalciferol (VITAMIN D-3) 5000 units TABS Take 1 tablet by mouth daily.    . finasteride (PROSCAR) 5 MG tablet Take 5 mg by mouth daily.    Marland Kitchen ibuprofen (ADVIL,MOTRIN) 200 MG tablet Take 600 mg by mouth every 6 (six) hours as needed for moderate pain.    . isosorbide mononitrate (IMDUR) 60 MG 24 hr tablet Take 1 tablet (60 mg total) by mouth daily. 90 tablet 3  . metoprolol succinate (TOPROL-XL) 50 MG 24  hr tablet Take 1 tablet (50 mg total) by mouth daily. Take with or immediately following a meal. 90 tablet 3  . nitroGLYCERIN (NITROSTAT) 0.4 MG SL tablet Place 1 tablet (0.4 mg total) under the tongue every 5 (five) minutes as needed for chest pain. 100 tablet 3  . oxybutynin (DITROPAN) 5 MG tablet Take 5 mg by mouth daily.     Marland Kitchen oxyCODONE-acetaminophen (PERCOCET) 5-325 MG tablet Take one or two tablets by mouth every 6 hours as needed for pain. 32 tablet 0  . pantoprazole (PROTONIX) 40 MG tablet Take 1 tablet (40 mg total) by mouth daily. 90 tablet 1  . ramipril (ALTACE) 5 MG capsule Take 1 capsule (5 mg total) by mouth daily. 90 capsule 3  . valACYclovir (VALTREX) 500 MG tablet Take 500 mg by mouth daily as needed (outbreak).      No current facility-administered medications for this visit.     Allergies  Allergen Reactions  . No Known Allergies       Review of Systems:       General:                      normal appetite, decreased energy, no weight gain, no weight loss, no fever             Cardiac:                       + chest pain with exertion, + chest pain at rest, + SOB with exertion, no  resting SOB, no PND, no orthopnea, no palpitations, no arrhythmia, no atrial fibrillation, no LE edema, + dizzy spells, no syncope             Respiratory:                 + shortness of breath, no home oxygen, no productive cough, no dry cough, no bronchitis, no wheezing, no hemoptysis, no asthma, no pain with inspiration or cough, + sleep apnea, no CPAP at night             GI:                               no difficulty swallowing, + reflux, + frequent heartburn, + hiatal hernia, no abdominal pain, no constipation, no diarrhea, no hematochezia, no hematemesis, no melena             GU:                              no dysuria,  no frequency, no urinary tract infection, no hematuria, no enlarged prostate, no kidney stones, no kidney disease             Vascular:                     no pain suggestive of claudication, no pain in feet, + occasional nocturnal leg cramps, no varicose veins, no DVT, no non-healing foot ulcer             Neuro:                         no stroke, no TIA's, no seizures, no headaches, no temporary blindness one eye,  no slurred speech, no peripheral neuropathy, no chronic pain, no instability  of gait, no memory/cognitive dysfunction             Musculoskeletal:         + arthritis, no joint swelling, no myalgias, no difficulty walking, normal mobility              Skin:                            no rash, no itching, no skin infections, no pressure sores or ulcerations             Psych:                         no anxiety, no depression, no nervousness, no unusual recent stress             Eyes:                           no blurry vision, no floaters, no recent vision changes, + wears glasses or contacts             ENT:                            no hearing loss, no loose or painful teeth, no dentures, last saw dentist many years ago             Hematologic:               + easy bruising, no abnormal bleeding, no clotting disorder, no frequent epistaxis             Endocrine:                    no diabetes, does not check CBG's at h     Physical Exam:   BP 110/60 (BP Location: Right Arm, Patient Position: Sitting, Cuff Size: Large)   Pulse 63   Resp 16   Ht 5\' 10"  (1.778 m)   Wt 210 lb (95.3 kg)   SpO2 95% Comment: ON RA  BMI 30.13 kg/m   General:  Elderly but  well-appearing  HEENT:  Unremarkable , NCAT, PERLA, EOMI,  Neck:   no JVD, no bruits, no adenopathy or thyromegaly  Chest:   clear to auscultation, symmetrical breath sounds, no wheezes, no rhonchi   CV:   RRR, grade III/VI crescendo/decrescendo murmur heard best at RSB,  no diastolic murmur  Abdomen:  soft, non-tender, no masses or organomegaly  Extremities:  warm, well-perfused, pulses palpable, no LE edema  Rectal/GU  Deferred  Neuro:   Grossly non-focal and symmetrical throughout  Skin:   Clean and dry, no rashes, no breakdown   Diagnostic Tests:  Result status: Final result                           [facility]                        [address]                        [place], [place] [address]  [phone]  ------------------------------------------------------------------- Transthoracic Echocardiography  Patient:    [name], [name] MR #:       [id number] Study Date: 11/20/2015 Gender:     M Age:        71 Height:     177.8 cm Weight:     97.3 kg BSA:        2.22 m^2 Pt. Status: Room:   ORDERING     Peter [name], M.D.  REFERRING    Peter [name], M.D.  SONOGRAPHER  Cindy Hazy, RDCS  ATTENDING    Ena Dawley, M.D.  PERFORMING   [facility] Outpatient  cc:  ------------------------------------------------------------------- LV EF: 35% -   40%  ------------------------------------------------------------------- Indications:      I35.9 Aortic Valve Disorder.  ------------------------------------------------------------------- History:   PMH:  Acquired from the patient and from the patient&'s chart.  PMH:  Ischemic  Cardiomyopathy. Chronic Systolic Heart Failure. Obstructive Sleep Apnea. LBBB. PVC&'s. CAD.  ------------------------------------------------------------------- Study Conclusions  - Left ventricle: The cavity size was normal. Wall thickness was   increased in a pattern of mild LVH. Anterolateral hypokinesis,   inferolateral severe hypokinesis. Systolic function was   moderately reduced. The estimated ejection fraction was in the   range of 35% to 40%. Doppler parameters are consistent with   abnormal left ventricular relaxation (grade 1 diastolic   dysfunction). - Aortic valve: Trileaflet; severely calcified leaflets. Moderate   to severe aortic stenosis. Moderate by mean gradient, severe by   calculated valve area. Mean gradient (S): 31 mm Hg. Peak gradient   (S): 46 mm Hg. Valve area (VTI): 0.8 cm^2. - Mitral valve: There was trivial regurgitation. - Left atrium: The atrium was mildly dilated. - Right ventricle: The cavity size was normal. Pacer wire or   catheter noted in right ventricle. Systolic function was mildly   reduced. - Tricuspid valve: Peak RV-RA gradient (S): 17 mm Hg. - Systemic veins: IVC not visualized.  Impressions:  - Normal LV size with mild LV hypertrophy. EF 35-40% with   anterolateral hypokinesis, inferolateral severe hypokinesis.   Normal RV size with mildly decreased systolic function. Moderate   to severe aortic stenosis. Moderate by mean gradient, severe by   calculated valve area. With low EF, possible low gradient severe   AS. Would consider low dose dobutamine stress echo.  ------------------------------------------------------------------- Labs, prior tests, procedures, and surgery: ICD.  Coronary artery bypass grafting.  ------------------------------------------------------------------- Study data:   Study status:  Routine.  Procedure:  The patient reported no pain pre or post test. Transthoracic echocardiography for left ventricular  function evaluation, for right ventricular function evaluation, and for assessment of valvular function. Image quality was adequate.  Study completion:  There were no complications.          Transthoracic echocardiography.  M-mode, complete 2D, spectral Doppler, and color Doppler.  Birthdate: Patient birthdate: Jun 28, 1945.  Age:  Patient is 71 yr old.  Sex: Gender: male.    BMI: 30.8 kg/m^2.  Blood pressure:     94/68 Patient status:  Outpatient.  Study date:  Study date: 11/20/2015. Study time: 07:54 AM.  Location:  [facility]  -------------------------------------------------------------------  ------------------------------------------------------------------- Left ventricle:  The cavity size was normal. Wall thickness was increased in a pattern of mild LVH. Anterolateral hypokinesis, inferolateral severe hypokinesis. Systolic function was moderately reduced. The estimated ejection fraction was in the range of 35% to 40%. Doppler parameters are consistent with abnormal left ventricular relaxation (grade 1 diastolic dysfunction).  ------------------------------------------------------------------- Aortic valve:   Trileaflet;  severely calcified leaflets. Moderate to severe aortic stenosis. Moderate by mean gradient, severe by calculated valve area.  Doppler:  There was no regurgitation. VTI ratio of LVOT to aortic valve: 0.23. Valve area (VTI): 0.8 cm^2. Indexed valve area (VTI): 0.36 cm^2/m^2. Peak velocity ratio of LVOT to aortic valve: 0.23. Valve area (Vmax): 0.81 cm^2. Indexed valve area (Vmax): 0.37 cm^2/m^2. Mean velocity ratio of LVOT to aortic valve: 0.24. Valve area (Vmean): 0.82 cm^2. Indexed valve area (Vmean): 0.37 cm^2/m^2.    Mean gradient (S): 31 mm Hg. Peak gradient (S): 46 mm Hg.  ------------------------------------------------------------------- Aorta:  Aortic root: The aortic root was normal in size. Ascending aorta: The ascending aorta was normal in  size.  ------------------------------------------------------------------- Mitral valve:   Mildly calcified leaflets .  Doppler:   There was no evidence for stenosis.   There was trivial regurgitation.  ------------------------------------------------------------------- Left atrium:  The atrium was mildly dilated.  ------------------------------------------------------------------- Right ventricle:  The cavity size was normal. Pacer wire or catheter noted in right ventricle. Systolic function was mildly reduced.  ------------------------------------------------------------------- Pulmonic valve:    Structurally normal valve.   Cusp separation was normal.  Doppler:  Transvalvular velocity was within the normal range. There was no regurgitation.  ------------------------------------------------------------------- Tricuspid valve:   Doppler:  There was trivial regurgitation.   ------------------------------------------------------------------- Right atrium:  The atrium was normal in size.  ------------------------------------------------------------------- Pericardium:  There was no pericardial effusion.  ------------------------------------------------------------------- Systemic veins:  IVC not visualized.  ------------------------------------------------------------------- Measurements   Left ventricle                            Value          Reference  LV ID, ED, PLAX chordal           (H)     54.2  mm       43 - 52  LV ID, ES, PLAX chordal           (H)     39.3  mm       23 - 38  LV fx shortening, PLAX chordal    (L)     27    %        >=29  LV PW thickness, ED                       14.2  mm       ---------  IVS/LV PW ratio, ED                       0.95           <=1.3  Stroke volume, 2D                         66    ml       ---------  Stroke volume/bsa, 2D                     30    ml/m^2   ---------  LV e&', lateral                            3.6   cm/s      ---------  LV E/e&', lateral  12.81          ---------  LV e&', medial                             4.91  cm/s     ---------  LV E/e&', medial                           9.39           ---------  LV e&', average                            4.26  cm/s     ---------  LV E/e&', average                          10.83          ---------    Ventricular septum                        Value          Reference  IVS thickness, ED                         13.5  mm       ---------    LVOT                                      Value          Reference  LVOT ID, S                                21    mm       ---------  LVOT area                                 3.46  cm^2     ---------  LVOT ID                                   21    mm       ---------  LVOT peak velocity, S                     79    cm/s     ---------  LVOT mean velocity, S                     58.8  cm/s     ---------  LVOT VTI, S                               19    cm       ---------  LVOT peak gradient, S                     2     mm Hg    ---------  Stroke volume (SV), LVOT DP  65.8  ml       ---------  Stroke index (SV/bsa), LVOT DP            29.7  ml/m^2   ---------    Aortic valve                              Value          Reference  Aortic valve peak velocity, S             338   cm/s     ---------  Aortic valve mean velocity, S             249   cm/s     ---------  Aortic valve VTI, S                       81.8  cm       ---------  Aortic mean gradient, S                   27    mm Hg    ---------  Aortic peak gradient, S                   46    mm Hg    ---------  VTI ratio, LVOT/AV                        0.23           ---------  Aortic valve area, VTI                    0.8   cm^2     ---------  Aortic valve area/bsa, VTI                0.36  cm^2/m^2 ---------  Velocity ratio, peak, LVOT/AV             0.23           ---------  Aortic valve area, peak velocity          0.81  cm^2      ---------  Aortic valve area/bsa, peak               0.37  cm^2/m^2 ---------  velocity  Velocity ratio, mean, LVOT/AV             0.24           ---------  Aortic valve area, mean velocity          0.82  cm^2     ---------  Aortic valve area/bsa, mean               0.37  cm^2/m^2 ---------  velocity    Aorta                                     Value          Reference  Aortic root ID, ED                        36    mm       ---------  Ascending aorta ID, A-P, S                33  mm       ---------    Left atrium                               Value          Reference  LA ID, A-P, ES                            56    mm       ---------  LA ID/bsa, A-P                    (H)     2.52  cm/m^2   <=2.2  LA volume, S                              82    ml       ---------  LA volume/bsa, S                          37    ml/m^2   ---------  LA volume, ES, 1-p A4C                    89    ml       ---------  LA volume/bsa, ES, 1-p A4C                40.1  ml/m^2   ---------  LA volume, ES, 1-p A2C                    75    ml       ---------  LA volume/bsa, ES, 1-p A2C                33.8  ml/m^2   ---------    Mitral valve                              Value          Reference  Mitral E-wave peak velocity               46.1  cm/s     ---------  Mitral A-wave peak velocity               90.5  cm/s     ---------  Mitral deceleration time          (H)     356   ms       150 - 230  Mitral E/A ratio, peak                    0.5            ---------    Tricuspid valve                           Value          Reference  Tricuspid regurg peak velocity            206   cm/s     ---------  Tricuspid peak RV-RA gradient             17    mm Hg    ---------  Right ventricle                           Value          Reference  RV s&', lateral, S                         10.2  cm/s     ---------  Legend: (L)  and  (H)  mark values outside specified reference  range.  ------------------------------------------------------------------- Prepared and Electronically Authenticated by  Loralie Champagne, M.D. 2017-11-08T16:11:18    Result status: Final result                           [facility]                        [address],  [address]                            [phone]  ------------------------------------------------------------------- Stress Echocardiography  Patient:    [name], [name] MR #:       [id number] Study Date: 12/19/2015 Gender:     M Age:        19 Height:     177.8 cm Weight:     97.3 kg BSA:        2.22 m^2 Pt. Status: Room:   ATTENDING    Peter [name], M.D.  ORDERING     Peter [name], M.D.  REFERRING    Peter [name], M.D.  SONOGRAPHER  Wyatt Mage, RDCS  PERFORMING   [facility] Outpatient  cc:  -------------------------------------------------------------------  ------------------------------------------------------------------- Indications:      Aortic stenosis (I35).  ------------------------------------------------------------------- Study Conclusions  - Baseline ECG: Normal sinus rhythm with Ventricular pacing. - Stress ECG conclusions: There were no stress arrhythmias or   conduction abnormalities. The stress ECG was non-diagnostic due   to paced rhythm. - Staged echo: There was no echocardiographic evidence for   stress-induced ischemia. - Impressions: Severe aortic stenosis at peak dobutamine infusion   with mean AV gradient of 94mmHg. Moderate LV dysfunction with   resting EF 34-40% with severe inferior, inferolateral and lateral   wall hypokinesis . After peak dobutamine infusion there the EF   increased slightly to 40-45% with persistence of severe   hypokinesis of the inferior, inferolateral and lateral walls.  Impressions:  - Severe aortic stenosis at peak dobutamine infusion with mean AV   gradient of 85mmHg. Moderate LV  dysfunction with resting EF   34-40% with severe inferior, inferolateral and lateral wall   hypokinesis After peak dobutamine infusion there the EF increased   slightly to 40-45% with persistence of severe hypokinesis of the   inferior, inferolateral and lateral walls.  ------------------------------------------------------------------- Study data:   Study status:  Routine.  Consent:  The risks, benefits, and alternatives to the procedure were explained to the patient and informed consent was obtained.  Procedure:  The patient reported no pain pre or post test. Initial setup. The patient was brought to the laboratory. A baseline ECG was recorded. Surface ECG leads and automatic cuff blood pressure measurements were monitored.  Dobutamine stress test. Stress testing was performed, with dobutamine  infusion from 5 to 20 mcg/kg/min. The infusion was terminated due to maximal dose administration. Transthoracic stress echocardiography for assessment of valvular function. Images were captured at baseline, low dose, peak dose, and recovery.  Study completion:  The patient tolerated the procedure well. There were no complications.          Dobutamine. Stress echocardiography. 2D.  Birthdate:  Patient birthdate: [birth year].  Age:  Patient is 71 yr old.  Sex:  Gender: male.    BMI: 30.8 kg/m^2.  Patient status:  Outpatient.  Study date:  Study date: 12/19/2015. Study time: 07:19 AM.  -------------------------------------------------------------------  ------------------------------------------------------------------- Aortic valve:   Doppler:     VTI ratio of LVOT to aortic valve: 0.3. Valve area (VTI): 0.95 cm^2. Indexed valve area (VTI): 0.43 cm^2/m^2. Peak velocity ratio of LVOT to aortic valve: 0.29. Valve area (Vmax): 0.91 cm^2. Indexed valve area (Vmax): 0.41 cm^2/m^2. Mean velocity ratio of LVOT to aortic valve: 0.26. Valve area (Vmean): 0.82 cm^2. Indexed valve area (Vmean): 0.37  cm^2/m^2. Mean gradient (S): 27 mm Hg. Peak gradient (S): 51 mm Hg.   ------------------------------------------------------------------- Baseline ECG:  Normal sinus rhythm with Ventricular pacing. Normal.   ------------------------------------------------------------------- Stress protocol:  +-----------------------+---+------------+--------+ !Stage                  !HR !BP (mmHg)   !Symptoms! +-----------------------+---+------------+--------+ !Baseline               !83 !141/97 (112)!None    ! +-----------------------+---+------------+--------+ !Dobutamine 5 ug/kg/min !64 !------------!--------! +-----------------------+---+------------+--------+ !Dobutamine 10 ug/kg/min!59 !143/83 (103)!--------! +-----------------------+---+------------+--------+ !Dobutamine 20 ug/kg/min!114!132/75 (94) !--------! +-----------------------+---+------------+--------+ !Immediate post stress  !114!------------!--------! +-----------------------+---+------------+--------+ !Recovery; 1 min        !120!137/77 (97) !--------! +-----------------------+---+------------+--------+ !Recovery; 2 min        !104!------------!--------! +-----------------------+---+------------+--------+ !Recovery; 3 min        !114!------------!--------! +-----------------------+---+------------+--------+ !Recovery; 4 min        !81 !158/91 (113)!--------! +-----------------------+---+------------+--------+ !Recovery; 5 min        !64 !------------!--------! +-----------------------+---+------------+--------+ !Recovery; 6 min        !69 !158/92 (114)!--------! +-----------------------+---+------------+--------+  ------------------------------------------------------------------- Stress results:   Maximal heart rate during stress was 120 bpm (81% of maximal predicted heart rate). The maximal predicted heart rate was 148 bpm.The target heart rate was achieved. The heart rate response to stress was normal. There was a  normal resting blood pressure with an appropriate response to stress. Normal blood pressure response to dobutamine. The rate-pressure product for the peak heart rate and blood pressure was 16440 mm Hg/min.  The patient experienced no chest pain during stress.  ------------------------------------------------------------------- Stress ECG:  There were no stress arrhythmias or conduction abnormalities. Isolated ventricular ectopy and ventricular couplets The stress ECG was non-diagnostic due to paced rhythm.  ------------------------------------------------------------------- Baseline:  - The estimated LV ejection fraction was 35-40%. - Hypokinesis of the lateral, inferolateral, and inferior LV   myocardium.  Aortic valve: Mean gradient: 33 mm Hg. Valve area: 0.58 cm^2. Low dose: Aortic valve: Mean gradient: 29 mm Hg. Valve area: 0.8 cm^2. Peak stress:  - The estimated LV ejection fraction was 40-45%. - Hypokinesis of the lateral, inferolateral, and inferior LV   myocardium.  Aortic valve: Mean gradient: 41 mm Hg. Valve area: 1.23 cm^2. Recovery: Aortic valve: Mean gradient: 28 mm Hg. Valve area: 0.96 cm^2.  ------------------------------------------------------------------- Stress echo results:     There was no echocardiographic evidence for stress-induced ischemia.  ------------------------------------------------------------------- Measurements   Left ventricle  Value  Stroke volume, 2D                        67    ml  Stroke volume/bsa, 2D                    30    ml/m^2    LVOT                                     Value  LVOT ID, S                               20    mm  LVOT area                                3.14  cm^2  LVOT peak velocity, S                    104   cm/s  LVOT mean velocity, S                    62.1  cm/s  LVOT VTI, S                              21.3  cm  LVOT peak gradient, S                    4     mm Hg     Aortic valve                             Value  Aortic valve peak velocity, S            358   cm/s  Aortic valve mean velocity, S            239   cm/s  Aortic valve VTI, S                      70.2  cm  Aortic mean gradient, S                  27    mm Hg  Aortic peak gradient, S                  51    mm Hg  VTI ratio, LVOT/AV                       0.3  Aortic valve area, VTI                   0.95  cm^2  Aortic valve area/bsa, VTI               0.43  cm^2/m^2  Velocity ratio, peak, LVOT/AV            0.29  Aortic valve area, peak velocity         0.91  cm^2  Aortic valve area/bsa, peak velocity     0.41  cm^2/m^2  Velocity ratio, mean, LVOT/AV  0.26  Aortic valve area, mean velocity         0.82  cm^2  Aortic valve area/bsa, mean velocity     0.37  cm^2/m^2  Legend: (L)  and  (H)  mark values outside specified reference range.  ------------------------------------------------------------------- Prepared and Electronically Authenticated by  Fransico Him, MD 2017-12-08T15:43:14    [name]  Cardiac catheterization  Order# [id number]  Reading physician: [name] [name], MD Ordering physician: [name] [name], MD Study date: 01/22/16  Physicians   Panel Physicians Referring Physician Case Authorizing Physician  [name] [name], MD (Primary)    Procedures   Right/Left Heart Cath and Coronary/Graft Angiography  Conclusion     LV end diastolic pressure is normal.  There is moderate aortic valve stenosis.  Ost LM lesion, 100 %stenosed.  Prox Cx to Mid Cx lesion, 70 %stenosed.  Mid Cx lesion, 80 %stenosed.  Ost LAD to Prox LAD lesion, 100 %stenosed.  Ost RCA lesion, 99 %stenosed.  Prox RCA lesion, 100 %stenosed.  LIMA graft was visualized by angiography and is normal in caliber and anatomically normal.  Seq SVG- graft was visualized by angiography and is normal in caliber and anatomically normal.  1st Mrg-2 lesion, 90 %stenosed.  1st Mrg-1  lesion, 99 %stenosed.  Dist Graft lesion, 0 %stenosed.  Seq SVG- graft was visualized by angiography and is normal in caliber and anatomically normal.  Origin lesion, 30 %stenosed.  LV end diastolic pressure is normal.   1. Severe 3 vessel and left main occlusive CAD 2. Patent LIMA to the LAD and first diagonal 3. Patent SVG to OM1 and OM2. The first OM is tiny and diffusely diseased. The stent in the mid SVG is widely patent 4. Patent SVG to PDA and PLOM 5. Moderate Aortic stenosis by cath but Severe AS by Dobutamine stress Echo c/w low gradient severe AS 6. Normal right heart pressures. 7. Normal LV filling pressures 8. Normal cardiac output.  Plan: consider for AVR/TAVR.   Indications   Nonrheumatic aortic valve stenosis [I35.0 (ICD-10-CM)]  Procedural Details/Technique   Technical Details Indication: 71 yo WM with history of CAD s/p CABG in 1996, s/p stenting of SVG to OM in 2016 presents for evaluation of low gradient severe AS. EF 30-35% by Echo.  Procedural Details: The right groin was prepped, draped, and anesthetized with 1% lidocaine. Using the modified Seldinger technique a 5 Fr sheath was placed in the right femoral artery and a 7 French sheath was placed in the right femoral vein. A Swan-Ganz catheter was used for the right heart catheterization. Standard protocol was followed for recording of right heart pressures and sampling of oxygen saturations. Fick cardiac output was calculated. Standard Judkins catheters were used for selective coronary and graft angiography and left ventricular pressures. There were no immediate procedural complications. The patient was transferred to the post catheterization recovery area for further monitoring.  Contrast: 100 cc   Estimated blood loss <50 mL.  During this procedure the patient was administered the following to achieve and maintain moderate conscious sedation: Versed 2 mg, Fentanyl 25 mcg, while the patient's heart rate,  blood pressure, and oxygen saturation were continuously monitored. The period of conscious sedation was 39 minutes, of which I was present face-to-face 100% of this time.    Complications   Complications documented before study signed (01/22/2016 9:34 AM EST)    No complications were associated with this study.  Documented by [name] [name], MD - 01/22/2016 9:33 AM EST  Coronary Findings   Dominance: Right  Left Main  Ost LM lesion, 100% stenosed.  Left Anterior Descending  Ost LAD to Prox LAD lesion, 100% stenosed.  Left Circumflex  Prox Cx to Mid Cx lesion, 70% stenosed.  Mid Cx lesion, 80% stenosed.  First Obtuse Marginal Branch  Vessel is small in size.  1st Mrg-1 lesion, 99% stenosed.  1st Mrg-2 lesion, 90% stenosed.  Right Coronary Artery  Ost RCA lesion, 99% stenosed.  Prox RCA lesion, 100% stenosed.  Graft Angiography  Free LIMA Graft to 1st Diag, Mid LAD  LIMA graft was visualized by angiography and is normal in caliber and anatomically normal.  Sequential jump graft Graft to 1st Mrg, 2nd Mrg  Seq SVG- graft was visualized by angiography and is normal in caliber and anatomically normal.  Dist Graft lesion before 1st Mrg, 0% stenosed. Previously placed Dist Graft drug eluting stent before 1st Mrg is widely patent.  Sequential jump graft Graft to RPDA, Dist RCA  Seq SVG- graft was visualized by angiography and is normal in caliber and anatomically normal.  Origin lesion before RPDA, 30% stenosed.  Right Heart   Right Heart Pressures LV EDP is normal. Normal right heart pressures.    Left Heart   Left Ventricle LV end diastolic pressure is normal.    Aortic Valve There is moderate aortic valve stenosis. The aortic valve is calcified.    Coronary Diagrams   Diagnostic Diagram     Implants     No implant documentation for this case.  PACS Images   Show images for Cardiac catheterization   Link to Procedure Log   Procedure Log    Hemo Data    Flowsheet Row Most Recent Value  Fick Cardiac Output 4.4 L/min  Fick Cardiac Output Index 2.11 (L/min)/BSA  Aortic Mean Gradient 20.8 mmHg  Aortic Peak Gradient 27 mmHg  Aortic Valve Area 1.29  Aortic Value Area Index 0.62 cm2/BSA  RA A Wave 7 mmHg  RA V Wave 6 mmHg  RA Mean 4 mmHg  RV Systolic Pressure 31 mmHg  RV Diastolic Pressure 2 mmHg  RV EDP 7 mmHg  PA Systolic Pressure 30 mmHg  PA Diastolic Pressure 10 mmHg  PA Mean 18 mmHg  PW A Wave 19 mmHg  PW V Wave 20 mmHg  PW Mean 11 mmHg  AO Systolic Pressure 123456 mmHg  AO Diastolic Pressure 82 mmHg  AO Mean 0000000 mmHg  LV Systolic Pressure 0000000 mmHg  LV Diastolic Pressure 10 mmHg  LV EDP 28 mmHg  Arterial Occlusion Pressure Extended Systolic Pressure [id number] mmHg  Arterial Occlusion Pressure Extended Diastolic Pressure 80 mmHg  Arterial Occlusion Pressure Extended Mean Pressure 112 mmHg  Left Ventricular Apex Extended Systolic Pressure 0000000 mmHg  Left Ventricular Apex Extended Diastolic Pressure 6 mmHg  Left Ventricular Apex Extended EDP Pressure 25 mmHg  QP/QS 1  TPVR Index 8.54 HRUI  TSVR Index 51.73 HRUI  PVR SVR Ratio 0.07  TPVR/TSVR Ratio 0.17    ADDENDUM REPORT: 02/04/2016 12:19  ADDENDUM: Please see separate dictation for contemporaneously obtained CTA of the chest, abdomen and pelvis dated 02/03/2016 for full description of extracardiac findings.   Electronically Signed   By: Vinnie Langton M.D.   On: 02/04/2016 12:19   Addended by Etheleen Mayhew, MD on 02/04/2016 12:22 PM    Study Result   CLINICAL DATA:  Aortic stenosis  EXAM: Cardiac TAVR CT  TECHNIQUE: The patient was scanned on a Philips 256 scanner. A 120  kV retrospective scan was triggered in the descending thoracic aorta at 111 HU's. Gantry rotation speed was 270 msecs and collimation was .9 mm. No beta blockade or nitro were given. The 3D data set was reconstructed in 5% intervals of the R-R cycle. Systolic and diastolic phases  were analyzed on a dedicated work station using MPR, MIP and VRT modes. The patient received 80 cc of contrast.  FINDINGS: Aortic Valve:  Trileaflet and calcified with restricted motion.  Aorta: Moderate calcification of the STJ ascending root, arch and descending thoracic aorta. No aneurysm or heavy plaque. Normal take off of arch vessels  Sinotubular Junction:  25 mm  Ascending Thoracic Aorta:  34 mm  Aortic Arch:  25 mm  Descending Thoracic Aorta:  27 mm  Sinus of Valsalva Measurements:  Non-coronary:  33 mm  Right -coronary:  31 mm  Left -coronary:  31 mm  Coronary Artery Height above Annulus:  Left Main:  15 mm above annulus  Right Coronary:  12.5 mm above annulus  Virtual Basal Annulus Measurements:  Maximum/Minimum Diameter:  28.7 mm x 20.5 mm  Perimeter:  82 mm  Area:  480 cm2  Coronary Arteries: Sufficient height above annulus but native arteries known to be occluded. Patent SVG to RCA and OM, Patent LIMA to LAD  Optimum Fluoroscopic Angle for Delivery: LAO 22 degrees Cr 3 degrees  IMPRESSION: 1) Calcified trileaflet aortic valve with annulus suitable for a 26 mm Sapien 3 valve  2) Native coronaries sufficient height for delivery with patient SVG to RCA, OM and patent LIMA to LAD  3) Calcified ascending aorta, STJ and thoracic aorta with no aneurysm  4) Optimum angle for delivery LAO 22 degrees Cranial 3 degrees  5) Biventricular AICD wires in place  [employer]  Electronically Signed: By: Jenkins Rouge M.D. On: 02/03/2016 14:00       CLINICAL DATA:  71 year old male with history of severe aortic stenosis. Preprocedural study prior to potential transcatheter aortic valve replacement (TAVR) procedure.  EXAM: CT ANGIOGRAPHY CHEST, ABDOMEN AND PELVIS  TECHNIQUE: Multidetector CT imaging through the chest, abdomen and pelvis was performed using the standard protocol during bolus administration  of intravenous contrast. Multiplanar reconstructed images and MIPs were obtained and reviewed to evaluate the vascular anatomy.  CONTRAST:  70 mL of Isovue 370.  COMPARISON:  Chest CT 02/27/2014. CT the abdomen and pelvis 05/24/2013.  FINDINGS: CTA CHEST FINDINGS  Cardiovascular: Heart size is mildly enlarged. There is no significant pericardial fluid, thickening or pericardial calcification. There is aortic atherosclerosis, as well as atherosclerosis of the great vessels of the mediastinum and the coronary arteries, including calcified atherosclerotic plaque in the left main, left anterior descending, left circumflex and right coronary arteries. Status post median sternotomy for CABG, including LIMA to the LAD. Profound myocardial thinning in the region of the lateral and inferolateral wall segments from the mid ventricle to the apex, indicative of scarring from prior left circumflex coronary artery territory myocardial infarction. Severe thickening calcification of the aortic valve. Left-sided biventricular pacemaker/AICD with lead tips terminating in the right ventricular apex, right atrial appendage, and overlying the lateral wall the left ventricle via the coronary sinus and coronary veins.  Mediastinum/Lymph Nodes: No pathologically enlarged mediastinal or hilar lymph nodes. Multiple densely calcified right hilar and mediastinal lymph nodes. Small hiatal hernia. No axillary lymphadenopathy.  Lungs/Pleura: Small calcified granuloma in the right upper lobe. Several tiny 2-3 mm pulmonary nodules are noted in the right lung, unchanged compared to prior  study 02/27/2014, considered benign at this time. No other larger more suspicious appearing pulmonary nodules or masses are noted. No acute consolidative airspace disease. No pleural effusions.  Musculoskeletal/Soft Tissues: Median sternotomy wires. There are no aggressive appearing lytic or blastic lesions noted in  the visualized portions of the skeleton.  CTA ABDOMEN AND PELVIS FINDINGS  Hepatobiliary: 1.3 cm low-attenuation lesion and adjacent subcentimeter low-attenuation lesions in segment 2 of the liver are unchanged compared to prior study from 05/24/2013, most compatible with small simple cysts. No other larger more suspicious appearing cystic or solid hepatic lesions. No intra or extrahepatic biliary ductal dilatation. Status post cholecystectomy.  Pancreas: No pancreatic mass. No pancreatic ductal dilatation. No pancreatic or peripancreatic fluid or inflammatory changes.  Spleen: Unremarkable.  Adrenals/Urinary Tract: Multiple low-attenuation lesions in both kidneys, compatible with simple cysts, largest of which measures up to 3.8 cm in diameter in the lower pole of the left kidney. New 8 mm lesion in the upper pole the right kidney (image 184 of series 401) which is too small to characterize, but appears to be intermediate attenuation (44 HU). No hydroureteronephrosis or perinephric stranding to indicate urinary tract obstruction at this time. Urinary bladder is normal in appearance. Bilateral adrenal glands are normal in appearance.  Stomach/Bowel: The appearance of the stomach is normal. There is no pathologic dilatation of small bowel or colon. Numerous colonic diverticulae are noted, without surrounding inflammatory changes to suggest an acute diverticulitis at this time. Normal appendix.  Vascular/Lymphatic: Extensive aortic atherosclerosis, with vascular findings and measurements pertinent to potential TAVR procedure, as detailed below. No aneurysm or dissection identified in the abdominal or pelvic vasculature. Single renal arteries bilaterally demonstrate atherosclerotic disease proximally resulting in mild stenosis. Celiac axis, superior mesenteric artery and inferior mesenteric artery all demonstrate normal flow without hemodynamically significant stenosis. No  lymphadenopathy noted in the abdomen or pelvis.  Reproductive: Prostate gland is mildly enlarged with median lobe hypertrophy. Seminal vesicles are unremarkable in appearance.  Other: No significant volume of ascites.  No pneumoperitoneum.  Musculoskeletal: There are no aggressive appearing lytic or blastic lesions noted in the visualized portions of the skeleton.  VASCULAR MEASUREMENTS PERTINENT TO TAVR:  AORTA:  Minimal Aortic Diameter -  12 x 14 mm  Severity of Aortic Calcification -  severe  RIGHT PELVIS:  Right Common Iliac Artery -  Minimal Diameter - 8.2 x 7.6 mm  Tortuosity - moderate to severe  Calcification - moderate to severe  Right External Iliac Artery -  Minimal Diameter - 9.3 x 9.2 mm  Tortuosity - moderate  Calcification - mild  Right Common Femoral Artery -  Minimal Diameter - 6.9 x 2.3 mm (mean diameter 4.6 mm)  Tortuosity - mild  Calcification - severe  LEFT PELVIS:  Left Common Iliac Artery -  Minimal Diameter - 10.2 x 6.7 mm  Tortuosity - moderate to severe  Calcification - moderate to severe  Left External Iliac Artery -  Minimal Diameter - 7.3 x 9.0 mm  Tortuosity - moderate  Calcification - mild  Left Common Femoral Artery -  Minimal Diameter - 8.2 x 3.1 mm (mean diameter of 5.7 mm)  Tortuosity - mild  Calcification - severe  Review of the MIP images confirms the above findings.  IMPRESSION: 1. Vascular findings and measurements pertinent to potential TAVR procedure, as detailed above. The patient may have suitable arterial access on the left side, but please take note of the asymmetric narrowing of the left common femoral artery related to  eccentric calcified plaque with a mean diameter of only 5.7 mm. 2. Severe thickening calcification of the aortic valve, compatible with the reported clinical history of severe aortic stenosis. 3. **An incidental finding of potential clinical  significance has been found. New 8 mm lesion in the upper pole the right kidney is technically too small to characterize, but may demonstrate some internal enhancement. Follow-up MRI of the abdomen with and without IV gadolinium is recommended in 6 months to re-evaluate this lesion and exclude neoplasm.** 4. Colonic diverticulosis without evidence of acute diverticulitis at this time. 5. Additional incidental findings, as above.   Electronically Signed   By: Vinnie Langton M.D.   On: 02/04/2016 15:19  Impression:  This gentleman has stage D, severe, symptomatic, low gradient, low ejection fraction aortic stenosis and severe coronary artery disease status post coronary artery bypass grafting in the remote past. He presents with persistent symptoms of chest pain that are somewhat atypical, occur usually at rest, possibly related to GE reflux but are relieved with sublingual nitroglycerin. His catheterization shows that all of his bypass grafts are patent with extensive calcification in the aortic root and proximal ascending thoracic aorta. He also has exertional fatigue, shortness of breath and dizziness that are relatively mild and do not really limit his activity at this time according to him. An echocardiogram shows moderate left ventricular systolic dysfunction with an ejection fraction of 35%. The aortic valve is trileaflet with severe calcification, leaflet thickening, and decreased mobility involving all 3 leaflets. Peak velocity measured across the aortic valve increased to greater than 4.0 m/s during dobutamine stress echocardiography. I agree that AVR is indicated in this patient for relief of symptoms and to prevent further LV deterioration. He is in the intermediate risk group for redo sternotomy and open surgical AVR by the STS PROM criteria but I think his overall risk is probably higher since he is totally graft dependent and has significant aortic root and ascending aortic  calcification. I think TAVR would be the best option for treating him. I have personally reviewed his gated cardiac CT and abdominal and pelvic CTA. He has anatomy favorable for a Sapien 3 transcatheter valve. He does have significant calcific plaque in the common femoral and iliac vessels bilaterally but I think the vessels are adequate for transfemoral access.   The patient and his wife were counseled at length regarding treatment alternatives for management of severe symptomatic aortic stenosis. Alternative approaches such as conventional aortic valve replacement with or without redo coronary artery bypass grafting, transcatheter aortic valve replacement, and palliative medical therapy were compared and contrasted at length.  The risks associated with conventional surgical aortic valve replacement have been discussed in detail, as were expectations for post-operative convalescence. Long-term prognosis with medical therapy was discussed. The patient and his wife have been advised of a variety of complications that might develop including but not limited to risks of death, stroke, paravalvular leak, aortic dissection or other major vascular complications, aortic annulus rupture, device embolization, cardiac rupture or perforation, mitral regurgitation, acute myocardial infarction, arrhythmia, heart block or bradycardia requiring permanent pacemaker placement, congestive heart failure, respiratory failure, renal failure, pneumonia, infection, other late complications related to structural valve deterioration or migration, or other complications that might ultimately cause a temporary or permanent loss of functional independence or other long term morbidity.  The patient provides full informed consent for the procedure as described and all questions were answered.  Plan:  Transfemoral TAVR on Tuesday 02/18/2016 by Dr. Roxy Manns  and Dr. Burt Knack.    Gaye Pollack, MD 02/11/2016 3:06 PM

## 2016-02-11 NOTE — Therapy (Signed)
[place], [place], [address] Phone: [phone]   Fax:  [phone]  Physical Therapy Evaluation  Patient Details  Name: [name] MRN: [id number] Date of Birth: 09-Apr-1945 Referring Provider: Dr. Darylene Price  Encounter Date: 02/11/2016      PT End of Session - 02/11/16 1348    Visit Number 1   PT Start Time [id number]   PT Stop Time 1325   PT Time Calculation (min) 30 min   Activity Tolerance Patient tolerated treatment well      Past Medical History:  Diagnosis Date  . Aortic stenosis   . Barrett's esophageal ulceration   . Chronic systolic CHF (congestive heart failure) ([place])   . Coronary artery disease    a. CABG 1996: LIMA graft to the LAD and diagonal, sequential vein graft to the acute marginal, PDA, and posterior lateral branches of the right coronary, sequential saphenous vein graft to the obtuse marginal vessel and distal circumflex. b. Cath 2007 patent grafts. c. s/p DES to SVG-OM2 in 03/2014.  Marland Kitchen Coronary artery disease involving autologous vein coronary bypass graft with angina pectoris ([place]) 03/13/2014   PCI using DES in SVG to LCx system  . Dental caries    pre-heart valve surgery protocol  . Gastroesophageal reflux disease   . History of erectile dysfunction   . History of hiatal hernia   . History of kidney stones   . History of obesity   . History of prostatitis   . Hypercholesterolemia   . Hypertension   . Kidney stones   . LBBB (left bundle branch block)   . Nodule of kidney 02/04/2016   Incidental 43mm nodule upper pole right kidney noted on CT angiogram - MRI with and without gadolinium contrast recommened in 6 months  . OSA (obstructive sleep apnea)    "suppose to wear mask; I don't" (03/13/2014)  . PVC's (premature ventricular contractions)   . S/P CABG x 7 01/07/1995   LIMA to LAD-diagonal, SVG to OM-LCx, SVG to AM-PD-RPL    Past Surgical History:  Procedure  Laterality Date  . [place]; ~ 2006   ; Ejection fraction is estimated at 45%  . CARDIAC CATHETERIZATION N/A 01/22/2016   Procedure: Right/Left Heart Cath and Coronary/Graft Angiography;  Surgeon: [name] [name], MD;  Location: [facility];  Service: Cardiovascular;  Laterality: N/A;  . CHOLECYSTECTOMY N/A 01/23/2014   Procedure: LAPAROSCOPIC CHOLECYSTECTOMY WITH INTRAOPERATIVE CHOLANGIOGRAM ;  Surgeon: Fanny Skates, MD;  Location: [place] [facility];  Service: General;  Laterality: N/A;  . COLONOSCOPY W/ POLYPECTOMY    . CORONARY ANGIOPLASTY WITH STENT PLACEMENT  03/13/2014   "1"  . CORONARY ARTERY BYPASS GRAFT  1996   "CABG X 7"  . CYSTOSCOPY WITH STENT PLACEMENT    . EP IMPLANTABLE DEVICE N/A 12/17/2014   Procedure: BiV ICD Insertion CRT-D;  Surgeon: Evans Lance, MD;  Location: [place] [facility];  Service: Cardiovascular;  Laterality: N/A;  . LEFT HEART CATHETERIZATION WITH CORONARY/GRAFT ANGIOGRAM N/A 03/13/2014   Procedure: LEFT HEART CATHETERIZATION WITH Beatrix Fetters;  Surgeon: [name] [name], MD;  Location: [facility] CATH LAB;  Service: Cardiovascular;  Laterality: N/A;  . MULTIPLE EXTRACTIONS WITH ALVEOLOPLASTY N/A 02/07/2016   Procedure: Extraction of tooth #'s 1,2,3,14,15, 18, 20, 29,30,31 with alveoloplasty and dental cleaning of teeth.;  Surgeon: Lenn Cal, DDS;  Location: [place];  Service: Oral Surgery;  Laterality: N/A;  . TONSILLECTOMY  ~ 1950  There were no vitals filed for this visit.       Subjective Assessment - 02/11/16 1300    Subjective reports frequent chest pain and dizziness with activity; wife also reports a grayish color   Patient Stated Goals return to working in [employer], woodworking, household chores   Currently in Pain? No/denies            [facility] PT Assessment - 02/11/16 0001      Assessment   Medical Diagnosis severe aortic stenosis   Referring Provider Dr. Darylene Price   Onset Date/Surgical Date 02/04/16      Precautions   Precautions None     Restrictions   Weight Bearing Restrictions No     Balance Screen   Has the patient fallen in the past 6 months No   Has the patient had a decrease in activity level because of a fear of falling?  No   Is the patient reluctant to leave their home because of a fear of falling?  No     Home Environment   Living Environment Private residence   Home Access Stairs to enter   Entrance Stairs-Number of Steps 3   Entrance Stairs-Rails None     Posture/Postural Control   Posture/Postural Control Postural limitations   Postural Limitations Forward head;Rounded Shoulders  mild     ROM / Strength   AROM / PROM / Strength AROM;Strength     AROM   Overall AROM Comments Grossly WNL although L shoulder limited ~10% but to pain. Recent cortisone shot helped some.      Strength   Overall Strength Comments Grossly 5/5   Strength Assessment Site Hand   Right/Left hand Right;Left   Right Hand Grip (lbs) 85  R hand dominant   Left Hand Grip (lbs) 85     Ambulation/Gait   Gait Comments No significant gait deviations. Pt reported 1/10 shortness of breath and his BP increased more than normal with 6 minute walk test.           [facility] Pre-Surgical Assessment - 02/11/16 0001    5 Meter Walk Test- trial 1 5 sec   5 Meter Walk Test- trial 2 4 sec.    5 Meter Walk Test- trial 3 5 sec.   5 meter walk test average 4.67 sec   4 Stage Balance Test Position 4   comment 5   Sit To Stand Test- trial 1 14 sec.   ADL/IADL Independent with: Bathing;Dressing;Meal prep;Finances   ADL/IADL Needs Assistance with: Valla Leaver work   ADL/IADL Fraility Index Vulnerable   6 Minute Walk- Baseline yes   BP (mmHg) 94/62   HR (bpm) 77   02 Sat (%RA) 95 %   Modified Borg Scale for Dyspnea 0- Nothing at all   Perceived Rate of Exertion (Borg) 6-   6 Minute Walk Post Test yes   BP (mmHg) 127/81   HR (bpm) 96   02 Sat (%RA) 87 %   Modified Borg Scale for Dyspnea 1- Very mild  shortness of breath   Perceived Rate of Exertion (Borg) 12-   Aerobic Endurance Distance Walked 1325   Endurance additional comments No rest breaks needed.                                      Plan - 02/11/16 1349    Clinical Impression Statement see below   PT Frequency  One time visit     Clinical Impression Statement: Pt is a 71 yo male presenting to OP PT for evaluation prior to possible TAVR surgery due to severe aortic stenosis. Pt reports regular chest pain and dizziness. Symptoms are limiting his ability to do word working, lawn and household chores. Pt presents with good ROM and strength, good balance and is not at high fall risk 4 stage balance test, good walking speed and fair aerobic endurance per 6 minute walk test.  Pt ambulated a total of 1325 feet in 6 minute walk. BP increased significantly with 6 minute walk test. Based on the Short Physical Performance Battery, patient has a frailty rating of 10/12 with </= 5/12 considered frail.   Patient demonstrated the following deficits and impairments:     Visit Diagnosis: Other abnormalities of gait and mobility  Abnormal posture      G-Codes - 03/06/16 1351    Functional Assessment Tool Used 6 minute walk 1325'    Functional Limitation Mobility: Walking and moving around   Mobility: Walking and Moving Around Current Status [phone]) At least 20 percent but less than 40 percent impaired, limited or restricted   Mobility: Walking and Moving Around Goal Status [phone]) At least 20 percent but less than 40 percent impaired, limited or restricted   Mobility: Walking and Moving Around Discharge Status [phone]) At least 20 percent but less than 40 percent impaired, limited or restricted       Problem List Patient Active Problem List   Diagnosis Date Noted  . Chronic periodontitis 02/05/2016  . Nodule of kidney 02/04/2016  . Coronary artery disease involving autologous artery coronary bypass graft with  angina pectoris ([place])   . Coronary artery disease involving native heart with angina pectoris ([place])   . Syncope 12/24/2014  . Bilateral carotid bruits 12/16/2014  . Abdominal bruit 12/16/2014  . PVC's (premature ventricular contractions)   . Chronic systolic CHF (congestive heart failure) ([place])   . Syncope and collapse versus seizures 12/15/2014  . Chronic chest pain 12/15/2014  . Aortic stenosis   . Coronary artery disease involving coronary bypass graft of native heart with angina pectoris ([place]) 03/13/2014  . Angina pectoris ([place]) 03/13/2014  . Atypical chest pain 02/21/2014  . Gallstones 05/16/2013  . Barrett's esophagus 05/16/2013  . History of nephrolithiasis 05/16/2013  . LBBB (left bundle branch block) 2011/03/07  . Coronary artery disease   . Hypercholesterolemia   . History of hypertension   . Gastroesophageal reflux disease   . History of obesity   . S/P CABG x 7 01/07/1995    Evann Erazo, PT 03-06-2016, 1:53 PM  [facility] [address] [place], [place], [address] Phone: [phone]   Fax:  [phone]  Name: [name] MRN: [id number] Date of Birth: June 18, 1945

## 2016-02-13 NOTE — Pre-Procedure Instructions (Signed)
[name]  02/13/2016      [id number] [place], [place] - [address] [place] [address] Phone: [phone] Fax: [phone]  [id number] [place] [address] Phone: [phone] Fax: [place] [address], [place] [address] [place] [address] Phone: [phone] Fax: [phone]    Your procedure is scheduled on February 6  Report to [facility] Admitting at 0800 A.M.  Call this number if you have problems the morning of surgery:  [phone]   Remember:  Do not eat food or drink liquids after midnight.   Take these medicines the morning of surgery with A SIP OF WATER finasteride (PROSCAR), aspirin, isosorbide mononitrate (IMDUR), metoprolol succinate (TOPROL-XL), Nitro if needed, oxybutynin (DITROPAN), oxyCODONE-acetaminophen (PERCOCET) if needed, pantoprazole (PROTONIX),    7 days prior to surgery STOP taking any Aleve, Naproxen, Ibuprofen, Motrin, Advil, Goody's, BC's, all herbal medications, fish oil, and all vitamins    Do not wear jewelry, make-up or nail polish.  Do not wear lotions, powders, or perfumes, or deoderant.  Do not shave 48 hours prior to surgery.  Men may shave face and neck.  Do not bring valuables to the hospital.  [facility] is not responsible for any belongings or valuables.  Contacts, dentures or bridgework may not be worn into surgery.  Leave your suitcase in the car.  After surgery it may be brought to your room.  For patients admitted to the hospital, discharge time will be determined by your treatment team.  Patients discharged the day of surgery will not be allowed to drive home.   Special instructions:   [place]- Preparing For Surgery  Before surgery, you can play an important role. Because skin is not sterile, your skin needs  to be as free of germs as possible. You can reduce the number of germs on your skin by washing with CHG (chlorahexidine gluconate) Soap before surgery.  CHG is an antiseptic cleaner which kills germs and bonds with the skin to continue killing germs even after washing.  Please do not use if you have an allergy to CHG or antibacterial soaps. If your skin becomes reddened/irritated stop using the CHG.  Do not shave (including legs and underarms) for at least 48 hours prior to first CHG shower. It is OK to shave your face.  Please follow these instructions carefully.   1. Shower the NIGHT BEFORE SURGERY and the MORNING OF SURGERY with CHG.   2. If you chose to wash your hair, wash your hair first as usual with your normal shampoo.  3. After you shampoo, rinse your hair and body thoroughly to remove the shampoo.  4. Use CHG as you would any other liquid soap. You can apply CHG directly to the skin and wash gently with a scrungie or a clean washcloth.   5. Apply the CHG Soap to your body ONLY FROM THE NECK DOWN.  Do not use on open wounds or open sores. Avoid contact with your eyes, ears, mouth and genitals (private parts). Wash genitals (private parts) with your normal soap.  6. Wash thoroughly, paying special attention to the area where your surgery will be performed.  7. Thoroughly rinse your body with warm water from the neck down.  8. DO NOT shower/wash with your normal soap after using and rinsing  off the CHG Soap.  9. Pat yourself dry with a CLEAN TOWEL.   10. Wear CLEAN PAJAMAS   11. Place CLEAN SHEETS on your bed the night of your first shower and DO NOT SLEEP WITH PETS.    Day of Surgery: Do not apply any deodorants/lotions. Please wear clean clothes to the hospital/surgery center.      Please read over the following fact sheets that you were given.

## 2016-02-14 ENCOUNTER — Encounter (HOSPITAL_COMMUNITY)
Admission: RE | Admit: 2016-02-14 | Discharge: 2016-02-14 | Disposition: A | Discharge status: Home / Self Care | Payer: Medicare Other | Source: Ambulatory Visit | Attending: Cardiovascular Disease | Admitting: Cardiovascular Disease

## 2016-02-14 ENCOUNTER — Encounter (HOSPITAL_COMMUNITY): Payer: Self-pay

## 2016-02-14 DIAGNOSIS — Z951 Presence of aortocoronary bypass graft: Secondary | ICD-10-CM | POA: Insufficient documentation

## 2016-02-14 DIAGNOSIS — I700 Atherosclerosis of aorta: Secondary | ICD-10-CM | POA: Diagnosis not present

## 2016-02-14 DIAGNOSIS — Z95810 Presence of automatic (implantable) cardiac defibrillator: Secondary | ICD-10-CM | POA: Diagnosis not present

## 2016-02-14 DIAGNOSIS — I350 Nonrheumatic aortic (valve) stenosis: Secondary | ICD-10-CM | POA: Diagnosis not present

## 2016-02-14 DIAGNOSIS — M47814 Spondylosis without myelopathy or radiculopathy, thoracic region: Secondary | ICD-10-CM | POA: Insufficient documentation

## 2016-02-14 DIAGNOSIS — I517 Cardiomegaly: Secondary | ICD-10-CM | POA: Diagnosis not present

## 2016-02-14 LAB — COMPREHENSIVE METABOLIC PANEL
ALBUMIN: 3.5 g/dL (ref 3.5–5.0)
ALT: 37 U/L (ref 17–63)
ANION GAP: 7 (ref 5–15)
AST: 39 U/L (ref 15–41)
Alkaline Phosphatase: 62 U/L (ref 38–126)
BUN: 19 mg/dL (ref 6–20)
CHLORIDE: 109 mmol/L (ref 101–111)
CO2: 24 mmol/L (ref 22–32)
Calcium: 9 mg/dL (ref 8.9–10.3)
Creatinine, Ser: 0.83 mg/dL (ref 0.61–1.24)
GFR calc Af Amer: >60 mL/min (ref >60)
GFR calc non Af Amer: >60 mL/min (ref >60)
GLUCOSE: 128 mg/dL — AB (ref 65–99)
POTASSIUM: 4 mmol/L (ref 3.5–5.1)
Sodium: 140 mmol/L (ref 135–145)
Total Bilirubin: 1.7 mg/dL — ABNORMAL HIGH (ref 0.3–1.2)
Total Protein: 6.1 g/dL — ABNORMAL LOW (ref 6.5–8.1)

## 2016-02-14 LAB — BLOOD GAS, ARTERIAL
ACID-BASE EXCESS: 0.5 mmol/L (ref 0.0–2.0)
BICARBONATE: 23.6 mmol/L (ref 20.0–28.0)
DRAWN BY: 470591
FIO2: 21
O2 Saturation: 94.4 %
PATIENT TEMPERATURE: 98.6
PO2 ART: 69.1 mmHg — AB (ref 83.0–108.0)
pCO2 arterial: 31.3 mmHg — ABNORMAL LOW (ref 32.0–48.0)
pH, Arterial: 7.489 — ABNORMAL HIGH (ref 7.350–7.450)

## 2016-02-14 LAB — URINALYSIS, ROUTINE W REFLEX MICROSCOPIC
BILIRUBIN URINE: NEGATIVE
GLUCOSE, UA: NEGATIVE mg/dL
Hgb urine dipstick: NEGATIVE
KETONES UR: NEGATIVE mg/dL
NITRITE: NEGATIVE
PH: 5 (ref 5.0–8.0)
Protein, ur: 30 mg/dL — AB
SPECIFIC GRAVITY, URINE: 1.029 (ref 1.005–1.030)

## 2016-02-14 LAB — CBC
HEMATOCRIT: 39.3 % (ref 39.0–52.0)
Hemoglobin: 13.4 g/dL (ref 13.0–17.0)
MCH: 30.3 pg (ref 26.0–34.0)
MCHC: 34.1 g/dL (ref 30.0–36.0)
MCV: 88.9 fL (ref 78.0–100.0)
PLATELETS: 151 10*3/uL (ref 150–400)
RBC: 4.42 MIL/uL (ref 4.22–5.81)
RDW: 12.5 % (ref 11.5–15.5)
WBC: 6 10*3/uL (ref 4.0–10.5)

## 2016-02-14 LAB — PROTIME-INR
INR: 1.04
Prothrombin Time: 13.6 seconds (ref 11.4–15.2)

## 2016-02-14 LAB — SURGICAL PCR SCREEN
MRSA, PCR: NEGATIVE
STAPHYLOCOCCUS AUREUS: NEGATIVE

## 2016-02-14 LAB — APTT: aPTT: 28 seconds (ref 24–36)

## 2016-02-14 LAB — ABO/RH: ABO/RH(D): A POS

## 2016-02-14 NOTE — Progress Notes (Signed)
PCP - Lisa Miller Cardiologist - Peter [name]  Chest x-ray - 02/14/16 EKG - 01/22/16 Stress Test - 12/19/15 ECHO - 11/20/15 Cardiac Cath - 01/22/16 Sleep Study - 20+ years ago CPAP -does not wear  Will send to anesthesia for review of cardiac history  Patient had a ICD/Pacemaker device form faxed to clinic Tomi Bamberger with [employer] called and made aware  Patient denies shortness of breath, fever, cough and chest pain at PAT appointment   Patient verbalized understanding of instructions that was given to them at the PAT appointment. Patient expressed that there were no further questions.  Patient was also instructed that they will need to review over the PAT instructions again at home before the surgery.

## 2016-02-14 NOTE — Progress Notes (Signed)
Left message for ryan about patients urine result

## 2016-02-15 LAB — HEMOGLOBIN A1C
HEMOGLOBIN A1C: 6 % — AB (ref 4.8–5.6)
MEAN PLASMA GLUCOSE: 126 mg/dL

## 2016-02-17 ENCOUNTER — Encounter (HOSPITAL_COMMUNITY): Payer: Self-pay | Admitting: Dentistry

## 2016-02-17 ENCOUNTER — Ambulatory Visit (HOSPITAL_COMMUNITY): Payer: Self-pay | Admitting: Dentistry

## 2016-02-17 VITALS — BP 165/93 | HR 59 | Temp 98.3°F

## 2016-02-17 DIAGNOSIS — I350 Nonrheumatic aortic (valve) stenosis: Secondary | ICD-10-CM

## 2016-02-17 DIAGNOSIS — K029 Dental caries, unspecified: Secondary | ICD-10-CM

## 2016-02-17 DIAGNOSIS — Z01818 Encounter for other preprocedural examination: Secondary | ICD-10-CM

## 2016-02-17 DIAGNOSIS — K08409 Partial loss of teeth, unspecified cause, unspecified class: Secondary | ICD-10-CM

## 2016-02-17 DIAGNOSIS — K08199 Complete loss of teeth due to other specified cause, unspecified class: Secondary | ICD-10-CM

## 2016-02-17 MED ORDER — SODIUM CHLORIDE 0.9 % IV SOLN
30.0000 ug/min | INTRAVENOUS | Status: DC
  Filled 2016-02-17: qty 2

## 2016-02-17 MED ORDER — DEXTROSE 5 % IV SOLN
1.5000 g | INTRAVENOUS | Status: AC
  Administered 2016-02-18: 1.5 g via INTRAVENOUS
  Filled 2016-02-17 (×2): qty 1.5

## 2016-02-17 MED ORDER — MAGNESIUM SULFATE 50 % IJ SOLN
40.0000 meq | INTRAMUSCULAR | Status: DC
  Filled 2016-02-17: qty 10

## 2016-02-17 MED ORDER — NITROGLYCERIN IN D5W 200-5 MCG/ML-% IV SOLN
2.0000 ug/min | INTRAVENOUS | Status: AC
Start: 2016-02-18 — End: 2016-02-18
  Administered 2016-02-18: 16.6 ug/min via INTRAVENOUS
  Filled 2016-02-17: qty 250

## 2016-02-17 MED ORDER — DOPAMINE-DEXTROSE 3.2-5 MG/ML-% IV SOLN
0.0000 ug/kg/min | INTRAVENOUS | Status: DC
  Filled 2016-02-17: qty 250

## 2016-02-17 MED ORDER — CHLORHEXIDINE GLUCONATE 0.12 % MT SOLN
15.0000 mL | Freq: Once | OROMUCOSAL | Status: AC
  Administered 2016-02-18: 15 mL via OROMUCOSAL
  Filled 2016-02-17: qty 15

## 2016-02-17 MED ORDER — VANCOMYCIN HCL 10 G IV SOLR
1500.0000 mg | INTRAVENOUS | Status: AC
  Administered 2016-02-18: 1500 mg via INTRAVENOUS
  Filled 2016-02-17: qty 1500

## 2016-02-17 MED ORDER — POTASSIUM CHLORIDE 2 MEQ/ML IV SOLN
80.0000 meq | INTRAVENOUS | Status: DC
  Filled 2016-02-17: qty 40

## 2016-02-17 MED ORDER — SODIUM CHLORIDE 0.9 % IV SOLN
INTRAVENOUS | Status: DC
  Filled 2016-02-17: qty 30

## 2016-02-17 MED ORDER — SODIUM CHLORIDE 0.9 % IV SOLN
INTRAVENOUS | Status: DC
  Filled 2016-02-17: qty 2.5

## 2016-02-17 MED ORDER — DEXMEDETOMIDINE HCL IN NACL 400 MCG/100ML IV SOLN
0.1000 ug/kg/h | INTRAVENOUS | Status: AC
  Administered 2016-02-18: .4 ug/kg/h via INTRAVENOUS
  Filled 2016-02-17: qty 100

## 2016-02-17 MED ORDER — NOREPINEPHRINE BITARTRATE 1 MG/ML IV SOLN
0.0000 ug/min | INTRAVENOUS | Status: AC
  Administered 2016-02-18: 1 ug/min via INTRAVENOUS
  Filled 2016-02-17: qty 4

## 2016-02-17 MED ORDER — EPINEPHRINE PF 1 MG/ML IJ SOLN
0.0000 ug/min | INTRAVENOUS | Status: DC
  Filled 2016-02-17: qty 4

## 2016-02-17 NOTE — Patient Instructions (Signed)
PLAN: 1. Continue salt water rinses as needed to aid healing. 2. Brush teeth after meals and at bedtime. 3. Maintain soft diet at this time. Advance as tolerated. 4. Patient is cleared for heart valve surgery at this time. 5. Follow-up with the dentist of his choice for evaluation of other dental treatment needs including dental restoration of cavity on the lower left molar once he is medically stable from the anticipated heart valve surgery. Patient will require antibiotic premedication prior to invasive dental procedures as per American Heart Association guidelines.   Lenn Cal, DDS

## 2016-02-17 NOTE — Progress Notes (Signed)
POST OPERATIVE NOTE:  02/17/2016 [name] [id number]  VITALS: BP (!) 165/93 (BP Location: Left Arm)   Pulse (!) 59   Temp 98.3 F (36.8 C) (Oral)   LABS:  Lab Results  Component Value Date   WBC 6.0 02/14/2016   HGB 13.4 02/14/2016   HCT 39.3 02/14/2016   MCV 88.9 02/14/2016   PLT 151 02/14/2016   BMET    Component Value Date/Time   NA 140 02/14/2016 1325   K 4.0 02/14/2016 1325   CL 109 02/14/2016 1325   CO2 24 02/14/2016 1325   GLUCOSE 128 (H) 02/14/2016 1325   BUN 19 02/14/2016 1325   CREATININE 0.83 02/14/2016 1325   CREATININE 0.68 (L) 01/15/2016 0939   CALCIUM 9.0 02/14/2016 1325   GFRNONAA >60 02/14/2016 1325   GFRAA >60 02/14/2016 1325    Lab Results  Component Value Date   INR 1.04 02/14/2016   INR 1.0 01/15/2016   INR 1.07 03/06/2014   No results found for: PTT   [name] is status post multiple extractions with alveoloplasty and dental cleaning of teeth in the operating room with general anesthesia on 02/07/2016.   SUBJECTIVE: Patient has some discomfort from dental extraction sites. Patient indicates that several stitches still remain. Patient is using salt water rinses as needed to aid healing. Patient is maintaining a soft diet.  EXAM: There is no sign of infection, heme, or ooze. Sutures are loosely intact. Patient is healing in by generalized primary closure in the lower quadrants but is healing with by secondary intention in the upper quadrants.   PROCEDURE: The patient was given a chlorhexidine gluconate rinse for 30 seconds. Sutures were then removed without complication. Patient tolerated the procedure well.  ASSESSMENT: Post operative course is consistent with dental procedures performed in the operating room.  PLAN: 1. Continue salt water rinses as needed to aid healing. 2. Brush teeth after meals and at bedtime. 3. Maintain soft diet at this time. Advance as tolerated. 4. Patient is cleared for heart valve surgery at  this time. 5. Follow-up with the dentist of his choice for evaluation of other dental treatment needs including dental restoration of cavity on the lower left molar once he is medically stable from the anticipated heart valve surgery. Patient will require antibiotic premedication prior to invasive dental procedures as per American Heart Association guidelines.   Lenn Cal, DDS

## 2016-02-17 NOTE — Progress Notes (Signed)
Anesthesia Chart Review:  Patient is a 71 year old male scheduled for TAVR on 02/18/16 by Dr. Burt Knack.   History includes former smoker, aortic stenosis, CAD s/p CABG (by notes, LIMA-LAD-DIAG, SVG-OM-LCx, SVG-AM-PD-RPL) '96 and DES to SVG-LCx '16, chronic chest pain, chronic systolic CHF, s/p [employer] bi-V ICD 12/17/14, left BBB, PVCs, hypercholesterolemia, HTN, GERD, hiatal hernia, ED, prostatitis, OSA (noncomplicant with CPAP), nephrolithiasis, right kidney nodule (01/2016 CT; 6 month f/u recommended), tonsillectomy, cholecystectomy '16, teeth extractions 02/07/16.  PCP is Dr. Kathyrn Lass. Cardiologist is Dr. Peter [name]. EP Cardiologist is Dr. [name].  Meds include aspirin 81 mg, Lipitor, finasteride, Imdur, Toprol, nitroglycerin, Ditropan, Percocet, Protonix, ramipril, Valtrex.  BP 122/66   Pulse 63   Temp 36.7 C (Oral)   Resp 18   Ht 5\' 10"  (1.778 m)   Wt 210 lb (95.3 kg)   SpO2 97%   BMI 30.13 kg/m   Cardiac cath 01/22/16: Conclusion:  LV end diastolic pressure is normal.  There is moderate aortic valve stenosis.  Ost LM lesion, 100 %stenosed.  Prox Cx to Mid Cx lesion, 70 %stenosed.  Mid Cx lesion, 80 %stenosed.  Ost LAD to Prox LAD lesion, 100 %stenosed.  Ost RCA lesion, 99 %stenosed.  Prox RCA lesion, 100 %stenosed.  LIMA graft was visualized by angiography and is normal in caliber and anatomically normal.  Seq SVG- graft was visualized by angiography and is normal in caliber and anatomically normal.  1st Mrg-2 lesion, 90 %stenosed.  1st Mrg-1 lesion, 99 %stenosed.  Dist Graft lesion, 0 %stenosed.  Seq SVG- graft was visualized by angiography and is normal in caliber and anatomically normal.  Origin lesion, 30 %stenosed.  LV end diastolic pressure is normal.  1. Severe 3 vessel and left main occlusive CAD 2. Patent LIMA to the LAD and first diagonal 3. Patent SVG to OM1 and OM2. The first OM is tiny and diffusely diseased. The stent in the mid SVG  is widely patent 4. Patent SVG to PDA and PLOM 5. Moderate Aortic stenosis by cath but Severe AS by Dobutamine stress Echo c/w low gradient severe AS 6. Normal right heart pressures. 7. Normal LV filling pressures 8. Normal cardiac output. Plan: consider for AVR/TAVR.  Echo 11/20/15: Study Conclusions - Left ventricle: The cavity size was normal. Wall thickness was   increased in a pattern of mild LVH. Anterolateral hypokinesis,   inferolateral severe hypokinesis. Systolic function was   moderately reduced. The estimated ejection fraction was in the   range of 35% to 40%. Doppler parameters are consistent with   abnormal left ventricular relaxation (grade 1 diastolic   dysfunction). - Aortic valve: Trileaflet; severely calcified leaflets. Moderate   to severe aortic stenosis. Moderate by mean gradient, severe by   calculated valve area. Mean gradient (S): 31 mm Hg. Peak gradient   (S): 46 mm Hg. Valve area (VTI): 0.8 cm^2. - Mitral valve: There was trivial regurgitation. - Left atrium: The atrium was mildly dilated. - Right ventricle: The cavity size was normal. Pacer wire or   catheter noted in right ventricle. Systolic function was mildly   reduced. - Tricuspid valve: Peak RV-RA gradient (S): 17 mm Hg. - Systemic veins: IVC not visualized.  Carotid U/S 12/16/14: Summary: Findings suggest 1-39% internal carotid artery stenosis bilaterally. Vertebral arteries are patent with antegrade flow.  Preoperative EKG, CTA chest/abd/pelvic, cardiac CT, and chest x-ray noted.  PFTs 02/03/16: FVC 3.11 (75%), FEV1 2.45 (81%), DLCOunc 16.83 (54%).  Preoperative labs noted.  Total  bilirubin was 1.7, previously 1.8 on 03/12/15.  CBC, AST/ALT, PT/PTT WNL. A1c 6.0. UA trace leukocytes, negative nitrites.  If no acute changes then I would anticipate that he could proceed as planned. PAT RN notified [employer] rep of surgery date/time.  George Hugh [facility] Short Stay  Center/Anesthesiology Phone [phone] 02/17/2016 11:04 AM

## 2016-02-17 NOTE — H&P (Signed)
[address]       [place] [address]             [phone]          CARDIOTHORACIC SURGERY HISTORY AND PHYSICAL EXAM  Referring Provider is [name], [name], MD PCP is Pcp Not In System      Chief Complaint  Patient presents with  . Aortic Stenosis    Surgical eval for AVR v/s TAVR, review all studies    HPI:  Patient is a 71 year old male with a long history of coronary artery disease status post coronary artery bypass grafting the remote past, vein graft disease status post PCI and stenting, aortic stenosis, chronic combined systolic and diastolic congestive heart failure, GE reflux disease with Barrett's esophagus, hypertension, hyperlipidemia, and obstructive sleep apnea who has been referred for surgical consultation to discuss treatment options for management of severe symptomatic low gradient low ejection fraction aortic stenosis. The patient's cardiac history dates back to 1996 when he presented with symptoms of angina pectoris and was found to have severe three-vessel coronary artery disease. He underwent coronary artery bypass grafting 7 by Dr. Redmond Pulling with grafts placed including left internal mammary artery sewn sequentially to the diagonal and left anterior descending coronary artery, sequential saphenous vein graft to the obtuse marginal and distal left circumflex coronary artery, and sequential saphenous vein graft sewn to the acute marginal, posterior descending coronary artery and right posterolateral branch. The patient states that he recovered from his surgery uneventfully and did well for many years. He has been followed by Dr. [name] and underwent catheterization 2007 which showed that all of his grafts were patent. In 2016 he developed chest pain and repeat cath revealed continued patency of all the grafts but 90% stenosis in the terminal segment of the sequential saphenous vein graft to the circumflex system. This was stented with a  drug-eluting stent. Ever since then the patient has continued to have episodes of chest pain. Symptoms have been somewhat atypical. A time symptoms have been thought to be related to eating spicy foods. He underwent cholecystectomy in January 2017, but the patient's chest pain persisted.  He underwent EGD revealing Barrett's esophagus but no evidence of active esophagitis. He takes Protonix regularly. The patient has continued to have episodes of chest pain that occur both with activity and at rest. Symptoms are typically relieved by sublingual nitroglycerin. In December 2016 the patient had a syncopal episode. Echocardiogram performed at that time revealed ejection fraction estimated 30-35% with moderate to severe aortic stenosis. Mean transvalvular gradient was reported 24 mmHg with aortic valve area calculated 0.8-0.9. He was noted to have left bundle branch block. He underwent placement of a biventricular pacemaker ICD for CRT.  Symptoms have persisted. Recent follow-up transthoracic echocardiogram performed 11/20/2015 revealed somewhat increased gradient across the aortic valve with peak velocity across the aortic valve measured 3.4 m/s corresponding to mean transvalvular gradient estimated between 27 and 31 mmHg.  The DVI was reported 0.23.  Left ventricular ejection fraction was estimated 35%. The patient subsequently underwent dobutamine stress echocardiography confirming the presence of low gradient low ejection fraction severe aortic stenosis with increased ejection fraction and increased transvalvular gradient estimated 41 mmHg at peak dobutamine infusion.  The patient subsequently underwent left and right heart catheterization on 01/22/2016. The patient had severe native coronary artery disease with 100% chronic occlusion of essentially all of the proximal coronary arteries. There remained continued patency of all the previously placed  bypass grafts, including the stented segment of the vein graft to the  left circumflex system. Hemodynamic findings at catheterization revealed peak to peak and mean transvalvular gradient across the aortic valve measured 27 and 20.8 mmHg, respectively.  The aortic valve area was calculated 1.29 centimeters squared. Pulmonary artery pressures were normal.  The patient was referred for surgical consultation.  The patient is married and lives locally in [place] with his wife. He has been retired for approximately 10 years, having previously worked [employer] for many years. He has remained reasonably active during retirement. He does not exercise regularly but he keeps himself busy around the house doing a variety of chores. He describes a long history of episodes of substernal chest pain and chest tightness that can occur both with activity and at rest. Symptoms at times are worse at night. Symptoms are usually relieved by administration of sublingual nitroglycerin. The patient admits to mild exertional shortness of breath although he states that his breathing does not limit his physical activities to any significant degree. He has had some dizzy spells without syncope recently. He felt particular dizzy this morning but he feels as though he may be dehydrated as he has not had much of anything to drink all day. He denies any recent history of lower extremity edema, PND, or orthopnea.   Past Medical History:  Diagnosis Date  . Aortic stenosis   . Barrett's esophageal ulceration   . Chronic systolic CHF (congestive heart failure) ([place])   . Coronary artery disease    a. CABG 1996: LIMA graft to the LAD and diagonal, sequential vein graft to the acute marginal, PDA, and posterior lateral branches of the right coronary, sequential saphenous vein graft to the obtuse marginal vessel and distal circumflex. b. Cath 2007 patent grafts. c. s/p DES to SVG-OM2 in 03/2014.  Marland Kitchen Coronary artery disease involving autologous vein coronary bypass graft with angina pectoris ([place])  03/13/2014   PCI using DES in SVG to LCx system  . Dental caries    pre-heart valve surgery protocol  . Gastroesophageal reflux disease   . History of erectile dysfunction   . History of hiatal hernia   . History of kidney stones   . History of obesity   . History of prostatitis   . Hypercholesterolemia   . Hypertension   . Kidney stones   . LBBB (left bundle branch block)   . Nodule of kidney 02/04/2016   Incidental 22mm nodule upper pole right kidney noted on CT angiogram - MRI with and without gadolinium contrast recommened in 6 months  . OSA (obstructive sleep apnea)    "suppose to wear mask; I don't" (03/13/2014)  . PVC's (premature ventricular contractions)   . S/P CABG x 7 01/07/1995   LIMA to LAD-diagonal, SVG to OM-LCx, SVG to AM-PD-RPL    Past Surgical History:  Procedure Laterality Date  . [place]; ~ 2006   ; Ejection fraction is estimated at 45%  . CARDIAC CATHETERIZATION N/A 01/22/2016   Procedure: Right/Left Heart Cath and Coronary/Graft Angiography;  Surgeon: [name] [name], MD;  Location: [facility];  Service: Cardiovascular;  Laterality: N/A;  . CHOLECYSTECTOMY N/A 01/23/2014   Procedure: LAPAROSCOPIC CHOLECYSTECTOMY WITH INTRAOPERATIVE CHOLANGIOGRAM ;  Surgeon: Fanny Skates, MD;  Location: [facility];  Service: General;  Laterality: N/A;  . COLONOSCOPY W/ POLYPECTOMY    . CORONARY ANGIOPLASTY WITH STENT PLACEMENT  03/13/2014   "1"  . CORONARY ARTERY BYPASS GRAFT  1996   "  CABG X 7"  . CYSTOSCOPY WITH STENT PLACEMENT    . EP IMPLANTABLE DEVICE N/A 12/17/2014   Procedure: BiV ICD Insertion CRT-D;  Surgeon: Evans Lance, MD;  Location: [facility];  Service: Cardiovascular;  Laterality: N/A;  . LEFT HEART CATHETERIZATION WITH CORONARY/GRAFT ANGIOGRAM N/A 03/13/2014   Procedure: LEFT HEART CATHETERIZATION WITH Beatrix Fetters;  Surgeon: [name] [name], MD;  [facility];  Service: Cardiovascular;  Laterality: N/A;  .  MULTIPLE EXTRACTIONS WITH ALVEOLOPLASTY N/A 02/07/2016   Procedure: Extraction of tooth #'s 1,2,3,14,15, 18, 20, 29,30,31 with alveoloplasty and dental cleaning of teeth.;  Surgeon: Lenn Cal, DDS;  Location: [facility];  Service: Oral Surgery;  Laterality: N/A;  . TONSILLECTOMY  ~ 1950    Family History  Problem Relation Age of Onset  . Heart attack Father   . Cancer Sister     Uterine Cancer    Social History Social History  Substance Use Topics  . Smoking status: Former Smoker    Packs/day: 0.75    Years: 15.00    Types: Cigarettes    Quit date: 01/12/1997  . Smokeless tobacco: Never Used     Comment: 3/4 pack per day. smoked 15 years off and on  . Alcohol use No    Prior to Admission medications   Medication Sig Start Date End Date Taking? Authorizing Provider  Ascorbic Acid (VITAMIN C) 1000 MG tablet Take 1,000 mg by mouth daily.   Yes Historical Provider, MD  aspirin EC 81 MG EC tablet Take 1 tablet (81 mg total) by mouth daily. 03/14/14  Yes Rhonda G Barrett, PA-C  atorvastatin (LIPITOR) 80 MG tablet Take 80 mg by mouth daily.   Yes Historical Provider, MD  Cholecalciferol (VITAMIN D-3) 5000 units TABS Take 1 tablet by mouth daily.   Yes Historical Provider, MD  finasteride (PROSCAR) 5 MG tablet Take 5 mg by mouth daily.   Yes Historical Provider, MD  ibuprofen (ADVIL,MOTRIN) 200 MG tablet Take 600 mg by mouth every 6 (six) hours as needed for moderate pain.   Yes Historical Provider, MD  isosorbide mononitrate (IMDUR) 60 MG 24 hr tablet Take 1 tablet (60 mg total) by mouth daily. 05/14/15  Yes [name] [name], MD  metoprolol succinate (TOPROL-XL) 50 MG 24 hr tablet Take 1 tablet (50 mg total) by mouth daily. Take with or immediately following a meal. 05/14/15  Yes [name] [name], MD  nitroGLYCERIN (NITROSTAT) 0.4 MG SL tablet Place 1 tablet (0.4 mg total) under the tongue every 5 (five) minutes as needed for chest pain. 02/07/16  Yes [name] [name], MD  oxybutynin (DITROPAN) 5 MG  tablet Take 5 mg by mouth daily.  07/04/12  Yes Historical Provider, MD  oxyCODONE-acetaminophen (PERCOCET) 5-325 MG tablet Take one or two tablets by mouth every 6 hours as needed for pain. 02/07/16  Yes Lenn Cal, DDS  pantoprazole (PROTONIX) 40 MG tablet Take 1 tablet (40 mg total) by mouth daily. 10/01/15  Yes [name] [name], MD  ramipril (ALTACE) 5 MG capsule Take 1 capsule (5 mg total) by mouth daily. 05/14/15  Yes [name] [name], MD  valACYclovir (VALTREX) 500 MG tablet Take 500 mg by mouth daily as needed (outbreak).  04/10/13  Yes Historical Provider, MD    Allergies  Allergen Reactions  . No Known Allergies      Review of Systems:              General:  normal appetite, decreased energy, no weight gain, no weight loss, no fever             Cardiac:                       + chest pain with exertion, + chest pain at rest, + SOB with exertion, no resting SOB, no PND, no orthopnea, no palpitations, no arrhythmia, no atrial fibrillation, no LE edema, + dizzy spells, no syncope             Respiratory:                 + shortness of breath, no home oxygen, no productive cough, no dry cough, no bronchitis, no wheezing, no hemoptysis, no asthma, no pain with inspiration or cough, + sleep apnea, no CPAP at night             GI:                               no difficulty swallowing, + reflux, + frequent heartburn, + hiatal hernia, no abdominal pain, no constipation, no diarrhea, no hematochezia, no hematemesis, no melena             GU:                              no dysuria,  no frequency, no urinary tract infection, no hematuria, no enlarged prostate, no kidney stones, no kidney disease             Vascular:                     no pain suggestive of claudication, no pain in feet, + occasional nocturnal leg cramps, no varicose veins, no DVT, no non-healing foot ulcer             Neuro:                         no stroke, no TIA's, no seizures, no headaches, no temporary  blindness one eye,  no slurred speech, no peripheral neuropathy, no chronic pain, no instability of gait, no memory/cognitive dysfunction             Musculoskeletal:         + arthritis, no joint swelling, no myalgias, no difficulty walking, normal mobility              Skin:                            no rash, no itching, no skin infections, no pressure sores or ulcerations             Psych:                         no anxiety, no depression, no nervousness, no unusual recent stress             Eyes:                           no blurry vision, no floaters, no recent vision changes, + wears glasses or contacts             ENT:  no hearing loss, no loose or painful teeth, no dentures, last saw dentist many years ago             Hematologic:               + easy bruising, no abnormal bleeding, no clotting disorder, no frequent epistaxis             Endocrine:                   no diabetes, does not check CBG's at home                                                       Physical Exam:              BP (!) 88/38   Pulse (!) 48   Resp 20   Ht 5\' 10"  (1.778 m)   Wt 210 lb (95.3 kg)   SpO2 91% Comment: RA  BMI 30.13 kg/m              General:                      Mildly obese,  well-appearing             HEENT:                       Unremarkable              Neck:                           no JVD, no bruits, no adenopathy              Chest:                          clear to auscultation, symmetrical breath sounds, no wheezes, no rhonchi              CV:                              RRR, grade grade II-III/VI crescendo/decrescendo murmur heard best at RUSB,  no diastolic murmur             Abdomen:                    soft, non-tender, no masses              Extremities:                 warm, well-perfused, pulses diminished but palpable, no LE edema             Rectal/GU                   Deferred             Neuro:                         Grossly non-focal and  symmetrical throughout             Skin:  Clean and dry, no rashes, no breakdown   Diagnostic Tests:  Transthoracic Echocardiography  Patient: Blake, Queenan MR #: [id number] Study Date: 11/20/2015 Gender: M Age: 65 Height: 177.8 cm Weight: 97.3 kg BSA: 2.22 m^2 Pt. Status: Room:  ORDERING Peter [name], M.D. REFERRING Peter [name], M.D. SONOGRAPHER Cindy Hazy, RDCS ATTENDING Ena Dawley, M.D. PERFORMING [facility], Outpatient  cc:  ------------------------------------------------------------------- LV EF: 35% - 40%  ------------------------------------------------------------------- Indications: I35.9 Aortic Valve Disorder.  ------------------------------------------------------------------- History: PMH: Acquired from the patient and from the patient&'s chart. PMH: Ischemic Cardiomyopathy. Chronic Systolic Heart Failure. Obstructive Sleep Apnea. LBBB. PVC&'s. CAD.  ------------------------------------------------------------------- Study Conclusions  - Left ventricle: The cavity size was normal. Wall thickness was increased in a pattern of mild LVH. Anterolateral hypokinesis, inferolateral severe hypokinesis. Systolic function was moderately reduced. The estimated ejection fraction was in the range of 35% to 40%. Doppler parameters are consistent with abnormal left ventricular relaxation (grade 1 diastolic dysfunction). - Aortic valve: Trileaflet; severely calcified leaflets. Moderate to severe aortic stenosis. Moderate by mean gradient, severe by calculated valve area. Mean gradient (S): 31 mm Hg. Peak gradient (S): 46 mm Hg. Valve area (VTI): 0.8 cm^2. - Mitral valve: There was trivial regurgitation. - Left atrium: The atrium was mildly dilated. - Right ventricle: The cavity size was normal. Pacer wire or catheter noted in right  ventricle. Systolic function was mildly reduced. - Tricuspid valve: Peak RV-RA gradient (S): 17 mm Hg. - Systemic veins: IVC not visualized.  Impressions:  - Normal LV size with mild LV hypertrophy. EF 35-40% with anterolateral hypokinesis, inferolateral severe hypokinesis. Normal RV size with mildly decreased systolic function. Moderate to severe aortic stenosis. Moderate by mean gradient, severe by calculated valve area. With low EF, possible low gradient severe AS. Would consider low dose dobutamine stress echo.  ------------------------------------------------------------------- Labs, prior tests, procedures, and surgery: ICD. Coronary artery bypass grafting.  ------------------------------------------------------------------- Study data: Study status: Routine. Procedure: The patient reported no pain pre or post test. Transthoracic echocardiography for left ventricular function evaluation, for right ventricular function evaluation, and for assessment of valvular function. Image quality was adequate. Study completion: There were no complications. Transthoracic echocardiography. M-mode, complete 2D, spectral Doppler, and color Doppler. Birthdate: Patient birthdate: [birth year]. Age: Patient is 71 yr old. Sex: Gender: male. BMI: 30.8 kg/m^2. Blood pressure: 94/68 Patient status: Outpatient. Study date: Study date: 11/20/2015. Study time: 07:54 AM. Location: [facility]  -------------------------------------------------------------------  ------------------------------------------------------------------- Left ventricle: The cavity size was normal. Wall thickness was increased in a pattern of mild LVH. Anterolateral hypokinesis, inferolateral severe hypokinesis. Systolic function was moderately reduced. The estimated ejection fraction was in the range of 35% to 40%. Doppler parameters are consistent with abnormal  left ventricular relaxation (grade 1 diastolic dysfunction).  ------------------------------------------------------------------- Aortic valve: Trileaflet; severely calcified leaflets. Moderate to severe aortic stenosis. Moderate by mean gradient, severe by calculated valve area. Doppler: There was no regurgitation. VTI ratio of LVOT to aortic valve: 0.23. Valve area (VTI): 0.8 cm^2. Indexed valve area (VTI): 0.36 cm^2/m^2. Peak velocity ratio of LVOT to aortic valve: 0.23. Valve area (Vmax): 0.81 cm^2. Indexed valve area (Vmax): 0.37 cm^2/m^2. Mean velocity ratio of LVOT to aortic valve: 0.24. Valve area (Vmean): 0.82 cm^2. Indexed valve area (Vmean): 0.37 cm^2/m^2. Mean gradient (S): 31 mm Hg. Peak gradient (S): 46 mm Hg.  ------------------------------------------------------------------- Aorta: Aortic root: The aortic root was normal in size. Ascending aorta: The ascending aorta was normal in size.  ------------------------------------------------------------------- Mitral valve: Mildly calcified leaflets . Doppler: There was no  evidence for stenosis. There was trivial regurgitation.  ------------------------------------------------------------------- Left atrium: The atrium was mildly dilated.  ------------------------------------------------------------------- Right ventricle: The cavity size was normal. Pacer wire or catheter noted in right ventricle. Systolic function was mildly reduced.  ------------------------------------------------------------------- Pulmonic valve: Structurally normal valve. Cusp separation was normal. Doppler: Transvalvular velocity was within the normal range. There was no regurgitation.  ------------------------------------------------------------------- Tricuspid valve: Doppler: There was trivial regurgitation.  ------------------------------------------------------------------- Right atrium: The atrium was  normal in size.  ------------------------------------------------------------------- Pericardium: There was no pericardial effusion.  ------------------------------------------------------------------- Systemic veins: IVC not visualized.  ------------------------------------------------------------------- Measurements  Left ventricle Value Reference LV ID, ED, PLAX chordal (H) 54.2 mm 43 - 52 LV ID, ES, PLAX chordal (H) 39.3 mm 23 - 38 LV fx shortening, PLAX chordal (L) 27 % >=29 LV PW thickness, ED 14.2 mm --------- IVS/LV PW ratio, ED 0.95 <=1.3 Stroke volume, 2D 66 ml --------- Stroke volume/bsa, 2D 30 ml/m^2 --------- LV e&', lateral 3.6 cm/s --------- LV E/e&', lateral 12.81 --------- LV e&', medial 4.91 cm/s --------- LV E/e&', medial 9.39 --------- LV e&', average 4.26 cm/s --------- LV E/e&', average 10.83 ---------  Ventricular septum Value Reference IVS thickness, ED 13.5 mm ---------  LVOT Value Reference LVOT ID, S 21 mm --------- LVOT area 3.46 cm^2 --------- LVOT ID 21 mm --------- LVOT peak velocity, S 79 cm/s --------- LVOT mean velocity, S 58.8 cm/s --------- LVOT VTI, S 19 cm --------- LVOT peak gradient, S  2 mm Hg --------- Stroke volume (SV), LVOT DP 65.8 ml --------- Stroke index (SV/bsa), LVOT DP 29.7 ml/m^2 ---------  Aortic valve Value Reference Aortic valve peak velocity, S 338 cm/s --------- Aortic valve mean velocity, S 249 cm/s --------- Aortic valve VTI, S 81.8 cm --------- Aortic mean gradient, S 27 mm Hg --------- Aortic peak gradient, S 46 mm Hg --------- VTI ratio, LVOT/AV 0.23 --------- Aortic valve area, VTI 0.8 cm^2 --------- Aortic valve area/bsa, VTI 0.36 cm^2/m^2 --------- Velocity ratio, peak, LVOT/AV 0.23 --------- Aortic valve area, peak velocity 0.81 cm^2 --------- Aortic valve area/bsa, peak 0.37 cm^2/m^2 --------- velocity Velocity ratio, mean, LVOT/AV 0.24 --------- Aortic valve area, mean velocity 0.82 cm^2 --------- Aortic valve area/bsa, mean 0.37 cm^2/m^2 --------- velocity  Aorta Value Reference Aortic root ID, ED 36 mm --------- Ascending aorta ID, A-P, S 33 mm ---------  Left atrium Value Reference LA ID, A-P, ES 56 mm --------- LA ID/bsa, A-P (H) 2.52 cm/m^2 <=2.2 LA volume, S 82 ml --------- LA volume/bsa, S 37 ml/m^2 --------- LA volume, ES, 1-p A4C 89 ml --------- LA volume/bsa, ES, 1-p A4C 40.1 ml/m^2 --------- LA volume,  ES, 1-p A2C 75 ml --------- LA volume/bsa, ES, 1-p A2C 33.8 ml/m^2 ---------  Mitral valve Value Reference Mitral E-wave peak velocity 46.1 cm/s --------- Mitral A-wave peak velocity 90.5 cm/s --------- Mitral deceleration time (H) 356 ms 150 - 230 Mitral E/A ratio, peak 0.5 ---------  Tricuspid valve Value Reference Tricuspid regurg peak velocity 206 cm/s --------- Tricuspid peak RV-RA gradient 17 mm Hg ---------  Right ventricle Value Reference RV s&', lateral, S 10.2 cm/s ---------  Legend: (L) and (H) mark values outside specified reference range.  ------------------------------------------------------------------- Prepared and Electronically Authenticated by  Loralie Champagne, M.D. 2017-11-08T16:11:18   Stress Echocardiography  Patient: Kaiea, Lewey MR #: [id number] Study Date: 12/19/2015 Gender: M Age: 54 Height: 177.8 cm Weight: 97.3 kg BSA: 2.22 m^2 Pt. Status: Room:  ATTENDING Peter [name], M.D. ORDERING Peter [name], M.D. REFERRING Peter [name], M.D. SONOGRAPHER Wyatt Mage, [place], Outpatient  cc:  -------------------------------------------------------------------  ------------------------------------------------------------------- Indications: Aortic stenosis (I35).  ------------------------------------------------------------------- Study Conclusions  - Baseline ECG: Normal sinus rhythm with Ventricular pacing. - Stress ECG conclusions: There were no stress arrhythmias or conduction abnormalities. The stress ECG was non-diagnostic due to  paced rhythm. - Staged echo: There was no echocardiographic evidence for stress-induced ischemia. - Impressions: Severe aortic stenosis at peak dobutamine infusion with mean AV gradient of 23mmHg. Moderate LV dysfunction with resting EF 34-40% with severe inferior, inferolateral and lateral wall hypokinesis . After peak dobutamine infusion there the EF increased slightly to 40-45% with persistence of severe hypokinesis of the inferior, inferolateral and lateral walls.  Impressions:  - Severe aortic stenosis at peak dobutamine infusion with mean AV gradient of 1mmHg. Moderate LV dysfunction with resting EF 34-40% with severe inferior, inferolateral and lateral wall hypokinesis After peak dobutamine infusion there the EF increased slightly to 40-45% with persistence of severe hypokinesis of the inferior, inferolateral and lateral walls.  ------------------------------------------------------------------- Study data: Study status: Routine. Consent: The risks, benefits, and alternatives to the procedure were explained to the patient and informed consent was obtained. Procedure: The patient reported no pain pre or post test. Initial setup. The patient was brought to the laboratory. A baseline ECG was recorded. Surface ECG leads and automatic cuff blood pressure measurements were monitored. Dobutamine stress test. Stress testing was performed, with dobutamine infusion from 5 to 20 mcg/kg/min. The infusion was terminated due to maximal dose administration. Transthoracic stress echocardiography for assessment of valvular function. Images were captured at baseline, low dose, peak dose, and recovery. Study completion: The patient tolerated the procedure well. There were no complications. Dobutamine. Stress echocardiography. 2D. Birthdate: Patient birthdate: [birth year]. Age: Patient is 71 yr old. Sex: Gender: male. BMI: 30.8 kg/m^2.  Patient status: Outpatient. Study date: Study date: 12/19/2015. Study time: 07:19 AM.  -------------------------------------------------------------------  ------------------------------------------------------------------- Aortic valve: Doppler: VTI ratio of LVOT to aortic valve: 0.3. Valve area (VTI): 0.95 cm^2. Indexed valve area (VTI): 0.43 cm^2/m^2. Peak velocity ratio of LVOT to aortic valve: 0.29. Valve area (Vmax): 0.91 cm^2. Indexed valve area (Vmax): 0.41 cm^2/m^2. Mean velocity ratio of LVOT to aortic valve: 0.26. Valve area (Vmean): 0.82 cm^2. Indexed valve area (Vmean): 0.37 cm^2/m^2. Mean gradient (S): 27 mm Hg. Peak gradient (S): 51 mm Hg.  ------------------------------------------------------------------- Baseline ECG: Normal sinus rhythm with Ventricular pacing. Normal.  ------------------------------------------------------------------- Stress protocol:  +-----------------------+---+------------+--------+ !Stage !HR !BP (mmHg) !Symptoms! +-----------------------+---+------------+--------+ !Baseline !83 !141/97 (112)!None ! +-----------------------+---+------------+--------+ !Dobutamine 5 ug/kg/min !64 !------------!--------! +-----------------------+---+------------+--------+ !Dobutamine 10 ug/kg/min!59 !143/83 (103)!--------! +-----------------------+---+------------+--------+ !Dobutamine 20 ug/kg/min!114!132/75 (94) !--------! +-----------------------+---+------------+--------+ !Immediate post stress !114!------------!--------! +-----------------------+---+------------+--------+ !Recovery; 1 min !120!137/77 (97) !--------! +-----------------------+---+------------+--------+ !Recovery; 2 min !104!------------!--------! +-----------------------+---+------------+--------+ !Recovery; 3 min !114!------------!--------! +-----------------------+---+------------+--------+ !Recovery; 4 min  !81 !158/91 (113)!--------! +-----------------------+---+------------+--------+ !Recovery; 5 min !64 !------------!--------! +-----------------------+---+------------+--------+ !Recovery; 6 min !69 !158/92 (114)!--------! +-----------------------+---+------------+--------+  ------------------------------------------------------------------- Stress results: Maximal heart rate during stress was 120 bpm (81% of maximal predicted heart rate). The maximal predicted heart rate was 148 bpm.The target heart rate was achieved. The heart rate response to stress was normal. There was a normal resting blood pressure with an appropriate response to stress. Normal blood pressure response to dobutamine. The rate-pressure product for the peak heart rate and blood pressure was 16440 mm Hg/min. The patient experienced no chest pain during stress.  ------------------------------------------------------------------- Stress ECG: There were no stress arrhythmias or conduction abnormalities. Isolated ventricular ectopy and ventricular couplets The stress ECG was non-diagnostic due to paced rhythm.  ------------------------------------------------------------------- Baseline:  - The estimated LV ejection fraction  was 35-40%. - Hypokinesis of the lateral, inferolateral, and inferior LV myocardium.  Aortic valve: Mean gradient: 33 mm Hg. Valve area: 0.58 cm^2. Low dose: Aortic valve: Mean gradient: 29 mm Hg. Valve area: 0.8 cm^2. Peak stress:  - The estimated LV ejection fraction was 40-45%. - Hypokinesis of the lateral, inferolateral, and inferior LV myocardium.  Aortic valve: Mean gradient: 41 mm Hg. Valve area: 1.23 cm^2. Recovery: Aortic valve: Mean gradient: 28 mm Hg. Valve area: 0.96 cm^2.  ------------------------------------------------------------------- Stress echo results: There was no echocardiographic evidence for stress-induced  ischemia.  ------------------------------------------------------------------- Measurements  Left ventricle Value Stroke volume, 2D 67 ml Stroke volume/bsa, 2D 30 ml/m^2  LVOT Value LVOT ID, S 20 mm LVOT area 3.14 cm^2 LVOT peak velocity, S 104 cm/s LVOT mean velocity, S 62.1 cm/s LVOT VTI, S 21.3 cm LVOT peak gradient, S 4 mm Hg  Aortic valve Value Aortic valve peak velocity, S 358 cm/s Aortic valve mean velocity, S 239 cm/s Aortic valve VTI, S 70.2 cm Aortic mean gradient, S 27 mm Hg Aortic peak gradient, S 51 mm Hg VTI ratio, LVOT/AV 0.3 Aortic valve area, VTI 0.95 cm^2 Aortic valve area/bsa, VTI 0.43 cm^2/m^2 Velocity ratio, peak, LVOT/AV 0.29 Aortic valve area, peak velocity 0.91 cm^2 Aortic valve area/bsa, peak velocity 0.41 cm^2/m^2 Velocity ratio, mean, LVOT/AV 0.26 Aortic valve area, mean velocity 0.82 cm^2 Aortic valve area/bsa, mean velocity 0.37 cm^2/m^2  Legend: (L) and (H) mark values outside specified reference range.  ------------------------------------------------------------------- Prepared and Electronically Authenticated by  Fransico Him, MD 2017-12-08T15:43:14     Right/Left Heart Cath and Coronary/Graft Angiography  Conclusion     LV end diastolic pressure is normal.  There is moderate aortic valve stenosis.  Ost LM lesion, 100 %stenosed.  Prox Cx to Mid Cx lesion, 70 %stenosed.  Mid Cx lesion, 80 %stenosed.  Ost  LAD to Prox LAD lesion, 100 %stenosed.  Ost RCA lesion, 99 %stenosed.  Prox RCA lesion, 100 %stenosed.  LIMA graft was visualized by angiography and is normal in caliber and anatomically normal.  Seq SVG- graft was visualized by angiography and is normal in caliber and anatomically normal.  1st Mrg-2 lesion, 90 %stenosed.  1st Mrg-1 lesion, 99 %stenosed.  Dist Graft lesion, 0 %stenosed.  Seq SVG- graft was visualized by angiography and is normal in caliber and anatomically normal.  Origin lesion, 30 %stenosed.  LV end diastolic pressure is normal.  1. Severe 3 vessel and left main occlusive CAD 2. Patent LIMA to the LAD and first diagonal 3. Patent SVG to OM1 and OM2. The first OM is tiny and diffusely diseased. The stent in the mid SVG is widely patent 4. Patent SVG to PDA and PLOM 5. Moderate Aortic stenosis by cath but Severe AS by Dobutamine stress Echo c/w low gradient severe AS 6. Normal right heart pressures. 7. Normal LV filling pressures 8. Normal cardiac output.  Plan: consider for AVR/TAVR.   Indications   Nonrheumatic aortic valve stenosis I35.0 (ICD-10-CM)  Procedural Details/Technique   Technical Details Indication: 71 yo WM with history of CAD s/p CABG in 1996, s/p stenting of SVG to OM in 2016 presents for evaluation of low gradient severe AS. EF 30-35% by Echo.  Procedural Details: The right groin was prepped, draped, and anesthetized with 1% lidocaine. Using the modified Seldinger technique a 5 Fr sheath was placed in the right femoral artery and a 7 French sheath was placed in the right femoral vein. A Swan-Ganz catheter was used for the right  heart catheterization. Standard protocol was followed for recording of right heart pressures and sampling of oxygen saturations. Fick cardiac output was calculated. Standard Judkins catheters were used for selective coronary and graft angiography and left ventricular pressures. There were no immediate procedural  complications. The patient was transferred to the post catheterization recovery area for further monitoring.  Contrast: 100 cc   Estimated blood loss <50 mL.  During this procedure the patient was administered the following to achieve and maintain moderate conscious sedation: Versed 2 mg, Fentanyl 25 mcg, while the patient's heart rate, blood pressure, and oxygen saturation were continuously monitored. The period of conscious sedation was 39 minutes, of which I was present face-to-face 100% of this time.    Complications   Complications documented before study signed (01/22/2016 9:34 AM EST)    No complications were associated with this study.  Documented by [name] [name], MD - 01/22/2016 9:33 AM EST    Coronary Findings   Dominance: Right  Left Main  Ost LM lesion, 100% stenosed.  Left Anterior Descending  Ost LAD to Prox LAD lesion, 100% stenosed.  Left Circumflex  Prox Cx to Mid Cx lesion, 70% stenosed.  Mid Cx lesion, 80% stenosed.  First Obtuse Marginal Branch  Vessel is small in size.  1st Mrg-1 lesion, 99% stenosed.  1st Mrg-2 lesion, 90% stenosed.  Right Coronary Artery  Ost RCA lesion, 99% stenosed.  Prox RCA lesion, 100% stenosed.  Graft Angiography  Free LIMA Graft to 1st Diag, Mid LAD  LIMA graft was visualized by angiography and is normal in caliber and anatomically normal.  Sequential jump graft Graft to 1st Mrg, 2nd Mrg  Seq SVG- graft was visualized by angiography and is normal in caliber and anatomically normal.  Dist Graft lesion before 1st Mrg, 0% stenosed. Previously placed Dist Graft drug eluting stent before 1st Mrg is widely patent.  Sequential jump graft Graft to RPDA, Dist RCA  Seq SVG- graft was visualized by angiography and is normal in caliber and anatomically normal.  Origin lesion before RPDA, 30% stenosed.  Right Heart   Right Heart Pressures LV EDP is normal. Normal right heart pressures.    Left Heart   Left Ventricle LV end  diastolic pressure is normal.    Aortic Valve There is moderate aortic valve stenosis. The aortic valve is calcified.    Coronary Diagrams   Diagnostic Diagram     Implants     No implant documentation for this case.  PACS Images   Show images for Cardiac catheterization   Link to Procedure Log   Procedure Log    Hemo Data   Flowsheet Row Most Recent Value  Fick Cardiac Output 4.4 L/min  Fick Cardiac Output Index 2.11 (L/min)/BSA  Aortic Mean Gradient 20.8 mmHg  Aortic Peak Gradient 27 mmHg  Aortic Valve Area 1.29  Aortic Value Area Index 0.62 cm2/BSA  RA A Wave 7 mmHg  RA V Wave 6 mmHg  RA Mean 4 mmHg  RV Systolic Pressure 31 mmHg  RV Diastolic Pressure 2 mmHg  RV EDP 7 mmHg  PA Systolic Pressure 30 mmHg  PA Diastolic Pressure 10 mmHg  PA Mean 18 mmHg  PW A Wave 19 mmHg  PW V Wave 20 mmHg  PW Mean 11 mmHg  AO Systolic Pressure 123456 mmHg  AO Diastolic Pressure 82 mmHg  AO Mean 0000000 mmHg  LV Systolic Pressure 0000000 mmHg  LV Diastolic Pressure 10 mmHg  LV EDP 28 mmHg  Arterial Occlusion  Pressure Extended Systolic Pressure [id number] mmHg  Arterial Occlusion Pressure Extended Diastolic Pressure 80 mmHg  Arterial Occlusion Pressure Extended Mean Pressure 112 mmHg  Left Ventricular Apex Extended Systolic Pressure 0000000 mmHg  Left Ventricular Apex Extended Diastolic Pressure 6 mmHg  Left Ventricular Apex Extended EDP Pressure 25 mmHg  QP/QS 1  TPVR Index 8.54 HRUI  TSVR Index 51.73 HRUI  PVR SVR Ratio 0.07  TPVR/TSVR Ratio 0.17    Cardiac TAVR CT  TECHNIQUE: The patient was scanned on a Philips 256 scanner. A 120 kV retrospective scan was triggered in the descending thoracic aorta at 111 HU's. Gantry rotation speed was 270 msecs and collimation was .9 mm. No beta blockade or nitro were given. The 3D data set was reconstructed in 5% intervals of the R-R cycle. Systolic and diastolic phases were analyzed on a dedicated work station using MPR, MIP and VRT  modes. The patient received 80 cc of contrast.  FINDINGS: Aortic Valve: Trileaflet and calcified with restricted motion.  Aorta: Moderate calcification of the STJ ascending root, arch and descending thoracic aorta. No aneurysm or heavy plaque. Normal take off of arch vessels  Sinotubular Junction: 25 mm  Ascending Thoracic Aorta: 34 mm  Aortic Arch: 25 mm  Descending Thoracic Aorta: 27 mm  Sinus of Valsalva Measurements:  Non-coronary: 33 mm  Right -coronary: 31 mm  Left -coronary: 31 mm  Coronary Artery Height above Annulus:  Left Main: 15 mm above annulus  Right Coronary: 12.5 mm above annulus  Virtual Basal Annulus Measurements:  Maximum/Minimum Diameter: 28.7 mm x 20.5 mm  Perimeter: 82 mm  Area: 480 cm2  Coronary Arteries: Sufficient height above annulus but native arteries known to be occluded. Patent SVG to RCA and OM, Patent LIMA to LAD  Optimum Fluoroscopic Angle for Delivery: LAO 22 degrees Cr 3 degrees  IMPRESSION: 1) Calcified trileaflet aortic valve with annulus suitable for a 26 mm Sapien 3 valve  2) Native coronaries sufficient height for delivery with patient SVG to RCA, OM and patent LIMA to LAD  3) Calcified ascending aorta, STJ and thoracic aorta with no aneurysm  4) Optimum angle for delivery LAO 22 degrees Cranial 3 degrees  5) Biventricular AICD wires in place  [employer]  Electronically Signed: By: Jenkins Rouge M.D. On: 02/03/2016 14:00   STS Risk Calculator  Procedure                                          AVR + redo CABG  Risk of Mortality                                4.1% Morbidity or Mortality                       22.7% Prolonged LOS                                   9.9% Short LOS                                           28.2% Permanent Stroke  1.8% Prolonged Vent Support                     16.8% DSW Infection                                      0.6% Renal Failure                                       5.2% Reoperation                                        10.0%    Impression:  Patient has stage D severe symptomatic low gradient low ejection fraction aortic stenosis and severe coronary artery disease status post coronary artery bypass grafting in the remote past. He presents with persistent symptoms of chest pain that are somewhat atypical, occur both with activity or at rest and possibly related to GE reflux disease, but are typically relieved with sublingual nitroglycerin, consistent with angina pectoris.  The patient also describes stable mild symptoms of exertional shortness of breath consistent with chronic combined systolic and diastolic heart failure, New York Heart Association functional class 1-2. I have personally reviewed the patient's recent transthoracic echocardiogram, dobutamine stress echocardiogram, diagnostic cardiac catheterization, and CT angiograms. Transthoracic echocardiogram reveals moderate left ventricular systolic dysfunction with ejection fraction estimated 35%. The aortic valve is trileaflet with severe calcification, leaflet thickening, and decreased mobility involving all 3 leaflets of the aortic valve. Peak velocity measured across the aortic valve ranged as high as 3.4 m/s but increased to greater than 4.0 m/s during dobutamine stress echocardiography.  Diagnostic cardiac catheterization demonstrates severe native coronary artery disease with essentially 100% occlusion of all of the proximal native coronaries. There is continued patency of all of the patient's previously placed bypass grafts with exception of the first obtuse marginal branch of the left circumflex coronary artery. Catheterization is also notable for the presence of extensive calcification in the aortic root and proximal ascending thoracic aorta.  I agree the patient would likely benefit from aortic valve replacement. Risks associated  with conventional surgical aortic valve replacement with or without redo coronary artery bypass grafting would be at least moderately elevated, and the degree of calcification in the proximal ascending thoracic aorta would likely increase risks of surgery considerably.  Cardiac gated CT angiogram of the heart reveals findings consistent with severe aortic stenosis with anatomical characteristics suitable for transcatheter aortic valve replacement without any significant complicating features. CT angiogram of the aorta and iliac vessels was performed yesterday but has not yet been reviewed by a radiologist. The patient appears to have adequate pelvic vascular access to facilitate a transfemoral approach for transcatheter aortic valve replacement.   Plan:  The patient and his wife were counseled at length regarding treatment alternatives for management of severe symptomatic aortic stenosis. Alternative approaches such as conventional aortic valve replacement with or without redo coronary artery bypass grafting, transcatheter aortic valve replacement, and palliative medical therapy were compared and contrasted at length.  The risks associated with conventional surgical aortic valve replacement were been discussed in detail, as were expectations for post-operative convalescence. Long-term prognosis with medical therapy was discussed. This discussion was placed in the context of the  patient's own specific clinical presentation and past medical history.  All of their questions have been addressed.  The patient is not interested in considering conventional surgery but is very interested in the possibility of proceeding with transcatheter aortic valve replacement in the near future.  Although he does not report any ongoing dental problems he admits that he does not have good teeth and he has not seen a dentist in many, many years. He probably should have a dental examination performed prior to surgery.  We  tentatively plan to proceed with transcatheter aortic valve replacement on Tuesday, 02/18/2016.  The patient has been instructed to make an effort to check his blood pressure at home if possible, particularly his dizzy spells persist. If so I have suggested that he may want to temporarily stop taking Imdur and notify Dr. [name] if his blood pressure remains low and/or he continues to have dizzy spells.  Following the decision to proceed with transcatheter aortic valve replacement, a discussion has been held regarding what types of management strategies would be attempted intraoperatively in the event of life-threatening complications, including whether or not the patient would be considered a candidate for the use of cardiopulmonary bypass and/or conversion to open sternotomy for attempted surgical intervention.  The patient has been advised of a variety of complications that might develop including but not limited to risks of death, stroke, paravalvular leak, aortic dissection or other major vascular complications, aortic annulus rupture, device embolization, cardiac rupture or perforation, mitral regurgitation, acute myocardial infarction, arrhythmia, heart block or bradycardia requiring permanent pacemaker placement, congestive heart failure, respiratory failure, renal failure, pneumonia, infection, other late complications related to structural valve deterioration or migration, or other complications that might ultimately cause a temporary or permanent loss of functional independence or other long term morbidity.  The patient provides full informed consent for the procedure as described and all questions were answered.    Valentina Gu. Roxy Manns, MD 02/04/2016 1:09 PM

## 2016-02-18 ENCOUNTER — Inpatient Hospital Stay (HOSPITAL_COMMUNITY): Payer: Medicare Other | Admitting: Certified Registered"

## 2016-02-18 ENCOUNTER — Encounter (HOSPITAL_COMMUNITY)
Admission: RE | Disposition: A | Discharge status: Home / Self Care | Payer: Self-pay | Source: Ambulatory Visit | Attending: Cardiovascular Disease

## 2016-02-18 ENCOUNTER — Inpatient Hospital Stay (HOSPITAL_COMMUNITY): Payer: Medicare Other | Admitting: Vascular Surgery

## 2016-02-18 ENCOUNTER — Inpatient Hospital Stay (HOSPITAL_COMMUNITY): Payer: Medicare Other

## 2016-02-18 ENCOUNTER — Encounter (HOSPITAL_COMMUNITY): Payer: Self-pay | Admitting: *Deleted

## 2016-02-18 ENCOUNTER — Inpatient Hospital Stay (HOSPITAL_COMMUNITY)
Admission: RE | Admit: 2016-02-18 | Discharge: 2016-02-20 | DRG: 266 | Disposition: A | Discharge status: Home / Self Care | Payer: Medicare Other | Source: Ambulatory Visit | Attending: Cardiovascular Disease | Admitting: Cardiovascular Disease

## 2016-02-18 DIAGNOSIS — K219 Gastro-esophageal reflux disease without esophagitis: Secondary | ICD-10-CM | POA: Diagnosis present

## 2016-02-18 DIAGNOSIS — Z8249 Family history of ischemic heart disease and other diseases of the circulatory system: Secondary | ICD-10-CM

## 2016-02-18 DIAGNOSIS — Z87891 Personal history of nicotine dependence: Secondary | ICD-10-CM | POA: Diagnosis not present

## 2016-02-18 DIAGNOSIS — Z7982 Long term (current) use of aspirin: Secondary | ICD-10-CM | POA: Diagnosis not present

## 2016-02-18 DIAGNOSIS — I252 Old myocardial infarction: Secondary | ICD-10-CM

## 2016-02-18 DIAGNOSIS — I25709 Atherosclerosis of coronary artery bypass graft(s), unspecified, with unspecified angina pectoris: Secondary | ICD-10-CM | POA: Diagnosis present

## 2016-02-18 DIAGNOSIS — E669 Obesity, unspecified: Secondary | ICD-10-CM | POA: Diagnosis present

## 2016-02-18 DIAGNOSIS — Z950 Presence of cardiac pacemaker: Secondary | ICD-10-CM

## 2016-02-18 DIAGNOSIS — I110 Hypertensive heart disease with heart failure: Secondary | ICD-10-CM | POA: Diagnosis present

## 2016-02-18 DIAGNOSIS — Z006 Encounter for examination for normal comparison and control in clinical research program: Secondary | ICD-10-CM

## 2016-02-18 DIAGNOSIS — I350 Nonrheumatic aortic (valve) stenosis: Secondary | ICD-10-CM | POA: Diagnosis present

## 2016-02-18 DIAGNOSIS — G4733 Obstructive sleep apnea (adult) (pediatric): Secondary | ICD-10-CM | POA: Diagnosis present

## 2016-02-18 DIAGNOSIS — E7800 Pure hypercholesterolemia, unspecified: Secondary | ICD-10-CM | POA: Diagnosis present

## 2016-02-18 DIAGNOSIS — Z954 Presence of other heart-valve replacement: Secondary | ICD-10-CM | POA: Diagnosis not present

## 2016-02-18 DIAGNOSIS — I2510 Atherosclerotic heart disease of native coronary artery without angina pectoris: Secondary | ICD-10-CM | POA: Diagnosis present

## 2016-02-18 DIAGNOSIS — Z6830 Body mass index (BMI) 30.0-30.9, adult: Secondary | ICD-10-CM

## 2016-02-18 DIAGNOSIS — I209 Angina pectoris, unspecified: Secondary | ICD-10-CM | POA: Diagnosis present

## 2016-02-18 DIAGNOSIS — I25729 Atherosclerosis of autologous artery coronary artery bypass graft(s) with unspecified angina pectoris: Secondary | ICD-10-CM | POA: Diagnosis present

## 2016-02-18 DIAGNOSIS — Z955 Presence of coronary angioplasty implant and graft: Secondary | ICD-10-CM | POA: Diagnosis not present

## 2016-02-18 DIAGNOSIS — Z952 Presence of prosthetic heart valve: Secondary | ICD-10-CM | POA: Diagnosis not present

## 2016-02-18 DIAGNOSIS — I25119 Atherosclerotic heart disease of native coronary artery with unspecified angina pectoris: Secondary | ICD-10-CM | POA: Diagnosis present

## 2016-02-18 DIAGNOSIS — E785 Hyperlipidemia, unspecified: Secondary | ICD-10-CM | POA: Diagnosis present

## 2016-02-18 DIAGNOSIS — Z8679 Personal history of other diseases of the circulatory system: Secondary | ICD-10-CM

## 2016-02-18 DIAGNOSIS — Z951 Presence of aortocoronary bypass graft: Secondary | ICD-10-CM

## 2016-02-18 DIAGNOSIS — R079 Chest pain, unspecified: Secondary | ICD-10-CM | POA: Diagnosis not present

## 2016-02-18 DIAGNOSIS — I5022 Chronic systolic (congestive) heart failure: Secondary | ICD-10-CM | POA: Diagnosis present

## 2016-02-18 DIAGNOSIS — I255 Ischemic cardiomyopathy: Secondary | ICD-10-CM | POA: Diagnosis present

## 2016-02-18 DIAGNOSIS — I5043 Acute on chronic combined systolic (congestive) and diastolic (congestive) heart failure: Secondary | ICD-10-CM | POA: Diagnosis present

## 2016-02-18 DIAGNOSIS — I447 Left bundle-branch block, unspecified: Secondary | ICD-10-CM | POA: Diagnosis present

## 2016-02-18 DIAGNOSIS — I200 Unstable angina: Secondary | ICD-10-CM | POA: Diagnosis present

## 2016-02-18 HISTORY — PX: TRANSCATHETER AORTIC VALVE REPLACEMENT, TRANSFEMORAL: SHX6400

## 2016-02-18 HISTORY — DX: Presence of prosthetic heart valve: Z95.2

## 2016-02-18 HISTORY — PX: TEE WITHOUT CARDIOVERSION: SHX5443

## 2016-02-18 LAB — POCT I-STAT 3, ART BLOOD GAS (G3+)
Acid-Base Excess: 3 mmol/L — ABNORMAL HIGH (ref 0.0–2.0)
Acid-Base Excess: 1 mmol/L (ref 0.0–2.0)
BICARBONATE: 27.5 mmol/L (ref 20.0–28.0)
Bicarbonate: 30.2 mmol/L — ABNORMAL HIGH (ref 20.0–28.0)
O2 SAT: 100 %
O2 Saturation: 96 %
PCO2 ART: 57.6 mmHg — AB (ref 32.0–48.0)
PH ART: 7.328 — AB (ref 7.350–7.450)
PH ART: 7.357 (ref 7.350–7.450)
PO2 ART: 84 mmHg (ref 83.0–108.0)
TCO2: 32 mmol/L (ref 0–100)
TCO2: 29 mmol/L (ref 0–100)
pCO2 arterial: 48.8 mmHg — ABNORMAL HIGH (ref 32.0–48.0)
pO2, Arterial: 436 mmHg — ABNORMAL HIGH (ref 83.0–108.0)

## 2016-02-18 LAB — POCT I-STAT, CHEM 8
BUN: 19 mg/dL (ref 6–20)
BUN: 18 mg/dL (ref 6–20)
CALCIUM ION: 1.22 mmol/L (ref 1.15–1.40)
CHLORIDE: 105 mmol/L (ref 101–111)
Calcium, Ion: 1.15 mmol/L (ref 1.15–1.40)
Chloride: 102 mmol/L (ref 101–111)
Creatinine, Ser: 0.6 mg/dL — ABNORMAL LOW (ref 0.61–1.24)
Creatinine, Ser: 0.7 mg/dL (ref 0.61–1.24)
Glucose, Bld: 93 mg/dL (ref 65–99)
Glucose, Bld: 104 mg/dL — ABNORMAL HIGH (ref 65–99)
HCT: 32 % — ABNORMAL LOW (ref 39.0–52.0)
HCT: 31 % — ABNORMAL LOW (ref 39.0–52.0)
Hemoglobin: 10.9 g/dL — ABNORMAL LOW (ref 13.0–17.0)
Hemoglobin: 10.5 g/dL — ABNORMAL LOW (ref 13.0–17.0)
Potassium: 3.9 mmol/L (ref 3.5–5.1)
Potassium: 3.9 mmol/L (ref 3.5–5.1)
SODIUM: 140 mmol/L (ref 135–145)
SODIUM: 141 mmol/L (ref 135–145)
TCO2: 30 mmol/L (ref 0–100)
TCO2: 28 mmol/L (ref 0–100)

## 2016-02-18 LAB — CBC
HEMATOCRIT: 36.1 % — AB (ref 39.0–52.0)
Hemoglobin: 12.3 g/dL — ABNORMAL LOW (ref 13.0–17.0)
MCH: 30.4 pg (ref 26.0–34.0)
MCHC: 34.1 g/dL (ref 30.0–36.0)
MCV: 89.1 fL (ref 78.0–100.0)
PLATELETS: 140 10*3/uL — AB (ref 150–400)
RBC: 4.05 MIL/uL — AB (ref 4.22–5.81)
RDW: 12.4 % (ref 11.5–15.5)
WBC: 6.8 10*3/uL (ref 4.0–10.5)

## 2016-02-18 LAB — POCT I-STAT 4, (NA,K, GLUC, HGB,HCT)
GLUCOSE: 93 mg/dL (ref 65–99)
HCT: 35 % — ABNORMAL LOW (ref 39.0–52.0)
HEMOGLOBIN: 11.9 g/dL — AB (ref 13.0–17.0)
POTASSIUM: 3.7 mmol/L (ref 3.5–5.1)
Sodium: 142 mmol/L (ref 135–145)

## 2016-02-18 LAB — PROTIME-INR
INR: 1.19
Prothrombin Time: 15.2 seconds (ref 11.4–15.2)

## 2016-02-18 LAB — PREPARE RBC (CROSSMATCH)

## 2016-02-18 LAB — APTT: APTT: 37 s — AB (ref 24–36)

## 2016-02-18 SURGERY — IMPLANTATION, AORTIC VALVE, TRANSCATHETER, FEMORAL APPROACH
Anesthesia: General | Site: Chest

## 2016-02-18 MED ORDER — TRAMADOL HCL 50 MG PO TABS
50.0000 mg | ORAL_TABLET | ORAL | Status: DC | PRN
  Administered 2016-02-18 (×2): 100 mg via ORAL
  Filled 2016-02-18 (×2): qty 2

## 2016-02-18 MED ORDER — ONDANSETRON HCL 4 MG/2ML IJ SOLN
INTRAMUSCULAR | Status: AC
  Filled 2016-02-18: qty 2

## 2016-02-18 MED ORDER — PROPOFOL 10 MG/ML IV BOLUS
INTRAVENOUS | Status: DC | PRN
  Administered 2016-02-18: 70 mg via INTRAVENOUS

## 2016-02-18 MED ORDER — SUGAMMADEX SODIUM 200 MG/2ML IV SOLN
INTRAVENOUS | Status: DC | PRN
  Administered 2016-02-18: 190 mg via INTRAVENOUS

## 2016-02-18 MED ORDER — FINASTERIDE 5 MG PO TABS
5.0000 mg | ORAL_TABLET | Freq: Every day | ORAL | Status: DC
  Administered 2016-02-19 – 2016-02-20 (×2): 5 mg via ORAL
  Filled 2016-02-18 (×2): qty 1

## 2016-02-18 MED ORDER — SODIUM CHLORIDE 0.9% FLUSH: 3.0000 mL | INTRAVENOUS | Status: DC | PRN

## 2016-02-18 MED ORDER — SODIUM CHLORIDE 0.9 % IV SOLN
INTRAVENOUS | Status: DC | PRN
  Administered 2016-02-18 (×3): 500 mL

## 2016-02-18 MED ORDER — METOPROLOL SUCCINATE ER 50 MG PO TB24: 50.0000 mg | ORAL_TABLET | Freq: Every day | ORAL | Status: DC

## 2016-02-18 MED ORDER — FENTANYL CITRATE (PF) 250 MCG/5ML IJ SOLN
INTRAMUSCULAR | Status: AC
  Filled 2016-02-18: qty 5

## 2016-02-18 MED ORDER — CHLORHEXIDINE GLUCONATE 4 % EX LIQD: 60.0000 mL | Freq: Once | CUTANEOUS | Status: DC

## 2016-02-18 MED ORDER — MEPERIDINE HCL 25 MG/ML IJ SOLN: 6.2500 mg | INTRAMUSCULAR | Status: DC | PRN

## 2016-02-18 MED ORDER — POTASSIUM CHLORIDE 2 MEQ/ML IV SOLN
30.0000 meq | Freq: Once | INTRAVENOUS | Status: AC
  Administered 2016-02-18: 30 meq via INTRAVENOUS
  Filled 2016-02-18: qty 15

## 2016-02-18 MED ORDER — ISOSORBIDE MONONITRATE ER 60 MG PO TB24
60.0000 mg | ORAL_TABLET | Freq: Every day | ORAL | Status: DC
  Administered 2016-02-19 – 2016-02-20 (×2): 60 mg via ORAL
  Filled 2016-02-18 (×2): qty 1

## 2016-02-18 MED ORDER — PANTOPRAZOLE SODIUM 40 MG PO TBEC
40.0000 mg | DELAYED_RELEASE_TABLET | Freq: Every day | ORAL | Status: DC
  Administered 2016-02-18 – 2016-02-20 (×3): 40 mg via ORAL
  Filled 2016-02-18 (×3): qty 1

## 2016-02-18 MED ORDER — SODIUM CHLORIDE 0.9% FLUSH
3.0000 mL | Freq: Two times a day (BID) | INTRAVENOUS | Status: DC
  Administered 2016-02-18: 3 mL via INTRAVENOUS
  Administered 2016-02-20: 10 mL via INTRAVENOUS

## 2016-02-18 MED ORDER — PROPOFOL 10 MG/ML IV BOLUS
INTRAVENOUS | Status: AC
  Filled 2016-02-18: qty 20

## 2016-02-18 MED ORDER — ROCURONIUM BROMIDE 100 MG/10ML IV SOLN
INTRAVENOUS | Status: DC | PRN
  Administered 2016-02-18: 50 mg via INTRAVENOUS

## 2016-02-18 MED ORDER — FENTANYL CITRATE (PF) 100 MCG/2ML IJ SOLN
INTRAMUSCULAR | Status: AC
  Administered 2016-02-18: 100 ug
  Filled 2016-02-18: qty 2

## 2016-02-18 MED ORDER — LACTATED RINGERS IV SOLN: 500.0000 mL | Freq: Once | INTRAVENOUS | Status: DC | PRN

## 2016-02-18 MED ORDER — MORPHINE SULFATE (PF) 2 MG/ML IV SOLN
2.0000 mg | INTRAVENOUS | Status: DC | PRN
  Administered 2016-02-18 – 2016-02-19 (×3): 2 mg via INTRAVENOUS
  Filled 2016-02-18 (×3): qty 1

## 2016-02-18 MED ORDER — OXYCODONE HCL 5 MG PO TABS
5.0000 mg | ORAL_TABLET | ORAL | Status: DC | PRN
  Administered 2016-02-19 (×2): 5 mg via ORAL
  Filled 2016-02-18 (×2): qty 1

## 2016-02-18 MED ORDER — CHLORHEXIDINE GLUCONATE 4 % EX LIQD: 30.0000 mL | CUTANEOUS | Status: DC

## 2016-02-18 MED ORDER — SODIUM CHLORIDE 0.9% FLUSH
3.0000 mL | Freq: Two times a day (BID) | INTRAVENOUS | Status: DC
Start: 2016-02-19 — End: 2016-02-20
  Administered 2016-02-19: 3 mL via INTRAVENOUS

## 2016-02-18 MED ORDER — MIDAZOLAM HCL 2 MG/2ML IJ SOLN
INTRAMUSCULAR | Status: AC
Start: 2016-02-18 — End: 2016-02-18
  Administered 2016-02-18: 2 mg
  Filled 2016-02-18: qty 2

## 2016-02-18 MED ORDER — RAMIPRIL 5 MG PO CAPS
5.0000 mg | ORAL_CAPSULE | Freq: Every day | ORAL | Status: DC
  Administered 2016-02-18 – 2016-02-19 (×2): 5 mg via ORAL
  Filled 2016-02-18 (×2): qty 1

## 2016-02-18 MED ORDER — IODIXANOL 320 MG/ML IV SOLN
INTRAVENOUS | Status: DC | PRN
  Administered 2016-02-18: 70.7 mL via INTRA_ARTERIAL

## 2016-02-18 MED ORDER — ONDANSETRON HCL 4 MG/2ML IJ SOLN
INTRAMUSCULAR | Status: DC | PRN
  Administered 2016-02-18: 4 mg via INTRAVENOUS

## 2016-02-18 MED ORDER — ASPIRIN EC 81 MG PO TBEC
81.0000 mg | DELAYED_RELEASE_TABLET | Freq: Every day | ORAL | Status: DC
  Administered 2016-02-19 – 2016-02-20 (×2): 81 mg via ORAL
  Filled 2016-02-18 (×2): qty 1

## 2016-02-18 MED ORDER — HYDROMORPHONE HCL 1 MG/ML IJ SOLN: 0.2500 mg | INTRAMUSCULAR | Status: DC | PRN

## 2016-02-18 MED ORDER — SODIUM CHLORIDE 0.9 % IV SOLN: Freq: Once | INTRAVENOUS | Status: DC

## 2016-02-18 MED ORDER — ALBUMIN HUMAN 5 % IV SOLN: 250.0000 mL | INTRAVENOUS | Status: AC | PRN

## 2016-02-18 MED ORDER — SODIUM CHLORIDE 0.9 % IV SOLN: 250.0000 mL | INTRAVENOUS | Status: DC | PRN

## 2016-02-18 MED ORDER — SODIUM CHLORIDE 0.9 % IV SOLN
0.0000 ug/min | INTRAVENOUS | Status: DC
  Filled 2016-02-18: qty 2

## 2016-02-18 MED ORDER — LABETALOL HCL 5 MG/ML IV SOLN
20.0000 mg | INTRAVENOUS | Status: DC | PRN
  Administered 2016-02-19: 20 mg via INTRAVENOUS
  Filled 2016-02-18: qty 4

## 2016-02-18 MED ORDER — CHLORHEXIDINE GLUCONATE 0.12 % MT SOLN
15.0000 mL | OROMUCOSAL | Status: AC
  Administered 2016-02-18: 15 mL via OROMUCOSAL
  Filled 2016-02-18: qty 15

## 2016-02-18 MED ORDER — NITROGLYCERIN IN D5W 200-5 MCG/ML-% IV SOLN
0.0000 ug/min | INTRAVENOUS | Status: DC
Start: 2016-02-18 — End: 2016-02-20

## 2016-02-18 MED ORDER — LIDOCAINE HCL (CARDIAC) 20 MG/ML IV SOLN
INTRAVENOUS | Status: DC | PRN
  Administered 2016-02-18: 100 mg via INTRAVENOUS

## 2016-02-18 MED ORDER — HEPARIN SODIUM (PORCINE) 1000 UNIT/ML IJ SOLN
INTRAMUSCULAR | Status: DC | PRN
  Administered 2016-02-18: 16000 [IU] via INTRAVENOUS

## 2016-02-18 MED ORDER — OXYBUTYNIN CHLORIDE 5 MG PO TABS
5.0000 mg | ORAL_TABLET | Freq: Every day | ORAL | Status: DC
  Administered 2016-02-18 – 2016-02-20 (×3): 5 mg via ORAL
  Filled 2016-02-18 (×3): qty 1

## 2016-02-18 MED ORDER — PROTAMINE SULFATE 10 MG/ML IV SOLN
INTRAVENOUS | Status: DC | PRN
  Administered 2016-02-18 (×2): 30 mg via INTRAVENOUS
  Administered 2016-02-18: 20 mg via INTRAVENOUS
  Administered 2016-02-18: 50 mg via INTRAVENOUS
  Administered 2016-02-18: 30 mg via INTRAVENOUS

## 2016-02-18 MED ORDER — ATORVASTATIN CALCIUM 80 MG PO TABS
80.0000 mg | ORAL_TABLET | Freq: Every day | ORAL | Status: DC
  Administered 2016-02-19 – 2016-02-20 (×2): 80 mg via ORAL
  Filled 2016-02-18 (×2): qty 1

## 2016-02-18 MED ORDER — ONDANSETRON HCL 4 MG/2ML IJ SOLN: 4.0000 mg | Freq: Once | INTRAMUSCULAR | Status: DC | PRN

## 2016-02-18 MED ORDER — FAMOTIDINE IN NACL 20-0.9 MG/50ML-% IV SOLN
20.0000 mg | Freq: Two times a day (BID) | INTRAVENOUS | Status: DC
  Administered 2016-02-18: 20 mg via INTRAVENOUS
  Filled 2016-02-18: qty 50

## 2016-02-18 MED ORDER — 0.9 % SODIUM CHLORIDE (POUR BTL) OPTIME
TOPICAL | Status: DC | PRN
  Administered 2016-02-18: 5000 mL

## 2016-02-18 MED ORDER — CLOPIDOGREL BISULFATE 75 MG PO TABS
75.0000 mg | ORAL_TABLET | Freq: Every day | ORAL | Status: DC
  Administered 2016-02-19 – 2016-02-20 (×2): 75 mg via ORAL
  Filled 2016-02-18 (×2): qty 1

## 2016-02-18 MED ORDER — HYDRALAZINE HCL 20 MG/ML IJ SOLN
10.0000 mg | INTRAMUSCULAR | Status: DC | PRN
  Administered 2016-02-18 (×2): 10 mg via INTRAVENOUS
  Filled 2016-02-18 (×2): qty 1

## 2016-02-18 MED ORDER — LIDOCAINE 2% (20 MG/ML) 5 ML SYRINGE
INTRAMUSCULAR | Status: AC
  Filled 2016-02-18: qty 5

## 2016-02-18 MED ORDER — MIDAZOLAM HCL 2 MG/2ML IJ SOLN: 2.0000 mg | INTRAMUSCULAR | Status: DC | PRN

## 2016-02-18 MED ORDER — METOPROLOL SUCCINATE ER 50 MG PO TB24
50.0000 mg | ORAL_TABLET | Freq: Every day | ORAL | Status: DC
  Administered 2016-02-18 – 2016-02-20 (×3): 50 mg via ORAL
  Filled 2016-02-18 (×3): qty 1

## 2016-02-18 MED ORDER — VANCOMYCIN HCL IN DEXTROSE 1-5 GM/200ML-% IV SOLN
1000.0000 mg | Freq: Once | INTRAVENOUS | Status: AC
  Administered 2016-02-18: 1000 mg via INTRAVENOUS
  Filled 2016-02-18: qty 200

## 2016-02-18 MED ORDER — ONDANSETRON HCL 4 MG/2ML IJ SOLN: 4.0000 mg | Freq: Four times a day (QID) | INTRAMUSCULAR | Status: DC | PRN

## 2016-02-18 MED ORDER — SUGAMMADEX SODIUM 200 MG/2ML IV SOLN
INTRAVENOUS | Status: AC
  Filled 2016-02-18: qty 2

## 2016-02-18 MED ORDER — RAMIPRIL 5 MG PO CAPS: 5.0000 mg | ORAL_CAPSULE | Freq: Every day | ORAL | Status: DC

## 2016-02-18 MED ORDER — SODIUM CHLORIDE 0.9 % IV SOLN
1.0000 mL/kg/h | INTRAVENOUS | Status: AC
  Administered 2016-02-18: 1 mL/kg/h via INTRAVENOUS

## 2016-02-18 MED ORDER — LACTATED RINGERS IV SOLN
INTRAVENOUS | Status: DC
Start: 2016-02-18 — End: 2016-02-20
  Administered 2016-02-18: 09:00:00 via INTRAVENOUS

## 2016-02-18 MED ORDER — FENTANYL CITRATE (PF) 100 MCG/2ML IJ SOLN
INTRAMUSCULAR | Status: DC | PRN
  Administered 2016-02-18: 150 ug via INTRAVENOUS

## 2016-02-18 MED ORDER — SODIUM CHLORIDE 0.9 % IV SOLN: 1.0000 mL/kg/h | INTRAVENOUS | Status: AC

## 2016-02-18 MED ORDER — ROCURONIUM BROMIDE 50 MG/5ML IV SOSY
PREFILLED_SYRINGE | INTRAVENOUS | Status: AC
  Filled 2016-02-18: qty 5

## 2016-02-18 MED ORDER — CEFUROXIME SODIUM 1.5 G IJ SOLR
1.5000 g | Freq: Two times a day (BID) | INTRAMUSCULAR | Status: AC
  Administered 2016-02-18 – 2016-02-20 (×4): 1.5 g via INTRAVENOUS
  Filled 2016-02-18 (×4): qty 1.5

## 2016-02-18 MED FILL — Potassium Chloride Inj 2 mEq/ML: INTRAVENOUS | Qty: 40 | Status: AC

## 2016-02-18 MED FILL — Magnesium Sulfate Inj 50%: INTRAMUSCULAR | Qty: 10 | Status: AC

## 2016-02-18 MED FILL — Heparin Sodium (Porcine) Inj 1000 Unit/ML: INTRAMUSCULAR | Qty: 30 | Status: AC

## 2016-02-18 SURGICAL SUPPLY — 103 items
ADAPTER UNIV SWAN GANZ BIP (ADAPTER) ×2 IMPLANT
ADAPTER UNV SWAN GANZ BIP (ADAPTER) ×1
ADH SKN CLS APL DERMABOND .7 (GAUZE/BANDAGES/DRESSINGS) ×2
ADPR CATH UNV NS SG CATH (ADAPTER) ×2
ATTRACTOMAT 16X20 MAGNETIC DRP (DRAPES) IMPLANT
BAG BANDED W/RUBBER/TAPE 36X54 (MISCELLANEOUS) ×3 IMPLANT
BAG DECANTER FOR FLEXI CONT (MISCELLANEOUS) IMPLANT
BAG EQP BAND 135X91 W/RBR TAPE (MISCELLANEOUS) ×2
BAG SNAP BAND KOVER 36X36 (MISCELLANEOUS) ×6 IMPLANT
BLADE 10 SAFETY STRL DISP (BLADE) ×3 IMPLANT
BLADE STERNUM SYSTEM 6 (BLADE) ×3 IMPLANT
BLADE SURG ROTATE 9660 (MISCELLANEOUS) IMPLANT
CABLE PACING FASLOC BIEGE (MISCELLANEOUS) ×3 IMPLANT
CABLE PACING FASLOC BLUE (MISCELLANEOUS) ×3 IMPLANT
CANISTER SUCTION 2500CC (MISCELLANEOUS) IMPLANT
CANNULA FEM VENOUS REMOTE 22FR (CANNULA) IMPLANT
CANNULA OPTISITE PERFUSION 16F (CANNULA) IMPLANT
CANNULA OPTISITE PERFUSION 18F (CANNULA) IMPLANT
CATH CROSS OVER TEMPO 5F (CATHETERS) ×3 IMPLANT
CATH DIAG EXPO 6F VENT PIG 145 (CATHETERS) ×6 IMPLANT
CATH EXPO 5FR AL1 (CATHETERS) ×3 IMPLANT
CATH S G BIP PACING (SET/KITS/TRAYS/PACK) ×6 IMPLANT
CLIP TI MEDIUM 24 (CLIP) ×3 IMPLANT
CLIP TI WIDE RED SMALL 24 (CLIP) ×3 IMPLANT
CONT SPEC 4OZ CLIKSEAL STRL BL (MISCELLANEOUS) ×6 IMPLANT
COVER DOME SNAP 22 D (MISCELLANEOUS) ×3 IMPLANT
COVER MAYO STAND STRL (DRAPES) ×3 IMPLANT
COVER TABLE BACK 60X90 (DRAPES) ×3 IMPLANT
CRADLE DONUT ADULT HEAD (MISCELLANEOUS) ×3 IMPLANT
DERMABOND ADVANCED (GAUZE/BANDAGES/DRESSINGS) ×1
DERMABOND ADVANCED .7 DNX12 (GAUZE/BANDAGES/DRESSINGS) ×2 IMPLANT
DEVICE CLOSURE PERCLS PRGLD 6F (VASCULAR PRODUCTS) IMPLANT
DRAPE INCISE IOBAN 66X45 STRL (DRAPES) IMPLANT
DRAPE SLUSH MACHINE 52X66 (DRAPES) ×3 IMPLANT
DRAPE TABLE COVER HEAVY DUTY (DRAPES) ×3 IMPLANT
DRSG TEGADERM 4X4.75 (GAUZE/BANDAGES/DRESSINGS) ×6 IMPLANT
ELECT REM PT RETURN 9FT ADLT (ELECTROSURGICAL) ×6
ELECTRODE REM PT RTRN 9FT ADLT (ELECTROSURGICAL) ×4 IMPLANT
FELT TEFLON 6X6 (MISCELLANEOUS) ×3 IMPLANT
FEMORAL VENOUS CANN RAP (CANNULA) IMPLANT
GAUZE SPONGE 4X4 12PLY STRL (GAUZE/BANDAGES/DRESSINGS) ×3 IMPLANT
GLOVE BIO SURGEON STRL SZ7.5 (GLOVE) ×3 IMPLANT
GLOVE BIO SURGEON STRL SZ8 (GLOVE) ×6 IMPLANT
GLOVE EUDERMIC 7 POWDERFREE (GLOVE) ×3 IMPLANT
GLOVE ORTHO TXT STRL SZ7.5 (GLOVE) ×3 IMPLANT
GOWN STRL REUS W/ TWL LRG LVL3 (GOWN DISPOSABLE) ×6 IMPLANT
GOWN STRL REUS W/ TWL XL LVL3 (GOWN DISPOSABLE) ×12 IMPLANT
GOWN STRL REUS W/TWL LRG LVL3 (GOWN DISPOSABLE) ×6
GOWN STRL REUS W/TWL XL LVL3 (GOWN DISPOSABLE) ×18
GUIDEWIRE SAF TJ AMPL .035X180 (WIRE) ×3 IMPLANT
GUIDEWIRE SAFE TJ AMPLATZ EXST (WIRE) ×3 IMPLANT
GUIDEWIRE STRAIGHT .035 260CM (WIRE) ×3 IMPLANT
INSERT FOGARTY 61MM (MISCELLANEOUS) ×3 IMPLANT
INSERT FOGARTY SM (MISCELLANEOUS) IMPLANT
INSERT FOGARTY XLG (MISCELLANEOUS) IMPLANT
KIT BASIN OR (CUSTOM PROCEDURE TRAY) ×3 IMPLANT
KIT DILATOR VASC 18G NDL (KITS) IMPLANT
KIT HEART LEFT (KITS) ×3 IMPLANT
KIT ROOM TURNOVER OR (KITS) ×3 IMPLANT
KIT SUCTION CATH 14FR (SUCTIONS) ×6 IMPLANT
NAMIC HIGH PRESSURE CONTRAST INJECTION LINE 20IN ×3 IMPLANT
NEEDLE PERC 18GX7CM (NEEDLE) ×3 IMPLANT
NS IRRIG 1000ML POUR BTL (IV SOLUTION) ×9 IMPLANT
PACK AORTA (CUSTOM PROCEDURE TRAY) ×3 IMPLANT
PAD ARMBOARD 7.5X6 YLW CONV (MISCELLANEOUS) ×6 IMPLANT
PAD ELECT DEFIB RADIOL ZOLL (MISCELLANEOUS) ×3 IMPLANT
PATCH TACHOSII LRG 9.5X4.8 (VASCULAR PRODUCTS) IMPLANT
PERCLOSE PROGLIDE 6F (VASCULAR PRODUCTS)
SET MICROPUNCTURE 5F STIFF (MISCELLANEOUS) ×3 IMPLANT
SHEATH AVANTI 11CM 8FR (MISCELLANEOUS) ×3 IMPLANT
SHEATH PINNACLE 6F 10CM (SHEATH) ×6 IMPLANT
SLEEVE REPOSITIONING LENGTH 30 (MISCELLANEOUS) ×3 IMPLANT
SPONGE GAUZE 4X4 12PLY STER LF (GAUZE/BANDAGES/DRESSINGS) ×6 IMPLANT
SPONGE LAP 4X18 X RAY DECT (DISPOSABLE) ×3 IMPLANT
STOPCOCK MORSE 400PSI 3WAY (MISCELLANEOUS) ×18 IMPLANT
SUT ETHIBOND X763 2 0 SH 1 (SUTURE) IMPLANT
SUT GORETEX CV 4 TH 22 36 (SUTURE) IMPLANT
SUT GORETEX CV4 TH-18 (SUTURE) IMPLANT
SUT GORETEX TH-18 36 INCH (SUTURE) IMPLANT
SUT MNCRL AB 3-0 PS2 18 (SUTURE) IMPLANT
SUT PROLENE 3 0 SH1 36 (SUTURE) IMPLANT
SUT PROLENE 4 0 RB 1 (SUTURE)
SUT PROLENE 4-0 RB1 .5 CRCL 36 (SUTURE) IMPLANT
SUT PROLENE 5 0 C 1 36 (SUTURE) IMPLANT
SUT PROLENE 6 0 C 1 30 (SUTURE) IMPLANT
SUT SILK  1 MH (SUTURE) ×1
SUT SILK 1 MH (SUTURE) ×2 IMPLANT
SUT SILK 2 0 SH CR/8 (SUTURE) IMPLANT
SUT VIC AB 2-0 CT1 27 (SUTURE)
SUT VIC AB 2-0 CT1 TAPERPNT 27 (SUTURE) IMPLANT
SUT VIC AB 2-0 CTX 36 (SUTURE) IMPLANT
SUT VIC AB 3-0 SH 8-18 (SUTURE) IMPLANT
SYR 30ML LL (SYRINGE) ×6 IMPLANT
SYR 50ML LL SCALE MARK (SYRINGE) ×3 IMPLANT
SYRINGE 10CC LL (SYRINGE) ×9 IMPLANT
TOWEL OR 17X26 10 PK STRL BLUE (TOWEL DISPOSABLE) ×6 IMPLANT
TRANSDUCER W/STOPCOCK (MISCELLANEOUS) ×6 IMPLANT
TRAY FOLEY IC TEMP SENS 14FR (CATHETERS) ×3 IMPLANT
TUBE SUCT INTRACARD DLP 20F (MISCELLANEOUS) IMPLANT
TUBING HIGH PRESSURE 120CM (CONNECTOR) ×3 IMPLANT
VALVE HEART TRANSCATH SZ3 26MM (Prosthesis & Implant Heart) ×3 IMPLANT
WIRE AMPLATZ SS-J .035X180CM (WIRE) ×3 IMPLANT
WIRE BENTSON .035X145CM (WIRE) ×3 IMPLANT

## 2016-02-18 NOTE — Significant Event (Signed)
Noted patient's left arm/hand shaking and twitching, then patient started shaking all over. Patient awake throughout, able to answer all questions appropriately. Only complaints is of being cold. Oral temperature checked-was 97.9 F. Warm blankets placed on patient.  E-Link MD called as well Dr. Burt Knack. Eventually placed patient on bair-hugger as temperature from foley catheter was 96.6 F.    [name]

## 2016-02-18 NOTE — Op Note (Signed)
  [facility]   MULTIDISCIPLINARY HEART VALVE TEAM   TAVR OPERATIVE NOTE   Date of Procedure:  02/18/2016  Preoperative Diagnosis: Severe Aortic Stenosis   Postoperative Diagnosis: Same   Procedure:    Transcatheter Aortic Valve Replacement - Percutaneous  Transfemoral Approach  Edwards Sapien 3 THV (size 26 mm, model # 9600TFX, serial # [id number])   Co-Surgeons:  Valentina Gu. Roxy Manns, MD and Sherren Mocha, MD  Anesthesiologist:  Lillia Abed, MD  Echocardiographer:  Ena Dawley, ND  Pre-operative Echo Findings:  Low-gradient severe aortic stenosis  Moderate left ventricular systolic dysfunction   Post-operative Echo Findings:  Mild paravalvular leak  unchanged left ventricular systolic function  DETAILS OF THE OPERATIVE PROCEDURE PLEASE SEE THE COMPLETE NOTE OF DR Roxy Manns FOR FULL OPERATIVE DETAILS  PERIPHERAL ACCESS:   Using the modified Seldinger technique, femoral arterial and venous access was obtained with placement of 6 Fr sheaths on the left side.  A pigtail diagnostic catheter was passed through the left femoral arterial sheath under fluoroscopic guidance into the aortic root.  A temporary transvenous pacemaker catheter was passed through the left femoral venous sheath under fluoroscopic guidance into the right ventricle.  The pacemaker was tested to ensure stable lead placement and pacemaker capture. Aortic root angiography was performed in order to determine the optimal angiographic angle for valve deployment.   TRANSFEMORAL ACCESS:  A micropuncture technique is used to access the right femoral artery under fluoroscopic guidance.  Caution is taken to enter the vessel on the superior aspect of the femoral head. Femoral angiography is performed to verify access in the common femoral artery. 2 Perclose devices are deployed at 10' and 2' positions to 'PreClose' the femoral artery. An 8 French sheath is placed and then an Amplatz Superstiff wire is advanced  through the sheath. This is changed out for a 14 French transfemoral E-Sheath after progressively dilating over the Superstiff wire.    PROCEDURE COMPLETION:  The sheath was removed and femoral artery closure is performed using the 2 previously deployed Perclose devices.  The access site is dry without evidence of hematoma after the perclose sutures are cut. Protamine was administered once femoral arterial repair was complete. The temporary pacemaker, pigtail catheters and femoral sheaths were removed with manual pressure used for hemostasis.   Sherren Mocha, MD 02/18/2016 1:21 PM

## 2016-02-18 NOTE — Progress Notes (Signed)
[employer] Rep called. Left message with Tomi Bamberger about surgery time

## 2016-02-18 NOTE — Transfer of Care (Signed)
Immediate Anesthesia Transfer of Care Note  Patient: [name]  Procedure(s) Performed: Procedure(s): TRANSCATHETER AORTIC VALVE REPLACEMENT, TRANSFEMORAL (N/A) TRANSESOPHAGEAL ECHOCARDIOGRAM (TEE) (N/A)  Patient Location: SICU  Anesthesia Type:General  Level of Consciousness: awake, alert  and oriented  Airway & Oxygen Therapy: Patient Spontanous Breathing and Patient connected to nasal cannula oxygen  Post-op Assessment: Report given to RN  Post vital signs: Reviewed and stable  Last Vitals:  Vitals:   02/18/16 0828 02/18/16 1203  BP: (!) 167/83   Pulse: (!) 58 (!) 53  Resp: 18   Temp: 37 C     Last Pain:  Vitals:   02/18/16 0828  TempSrc: Oral  PainSc: 0-No pain         Complications: No apparent anesthesia complications

## 2016-02-18 NOTE — Op Note (Signed)
[facility]   MULTIDISCIPLINARY HEART VALVE TEAM   TAVR OPERATIVE NOTE   Date of Procedure:  02/18/2016  Preoperative Diagnosis: Severe Aortic Stenosis   Postoperative Diagnosis: Same   Procedure:    Transcatheter Aortic Valve Replacement - Percutaneous Right Transfemoral Approach  Edwards Sapien 3 THV (size 26 mm, model # 9600TFX, serial # [id number])   Co-Surgeons:  Valentina Gu. Roxy Manns, MD and Sherren Mocha, MD  Anesthesiologist:  Lillia Abed, MD  Echocardiographer:  Ena Dawley, MD  Pre-operative Echo Findings:  Low gradient, low ejection fraction severe aortic stenosis  Moderate left ventricular systolic dysfunction  Post-operative Echo Findings:  Mild paravalvular leak  Unchanged left ventricular systolic function    BRIEF CLINICAL NOTE AND INDICATIONS FOR SURGERY   Patient is a 71 year old male with a long history of coronary artery disease status post coronary artery bypass grafting the remote past, vein graft disease status post PCI and stenting, aortic stenosis, chronic combined systolic and diastolic congestive heart failure, GE reflux disease with Barrett's esophagus, hypertension, hyperlipidemia, and obstructive sleep apnea who has been referred for surgical consultation to discuss treatment options for management of severe symptomatic low gradient low ejection fraction aortic stenosis. The patient's cardiac history dates back to 1996 when he presented with symptoms of angina pectoris and was found to have severe three-vessel coronary artery disease. He underwent coronary artery bypass grafting 7 by Dr. Redmond Pulling with grafts placed including left internal mammary artery sewn sequentially to the diagonal and left anterior descending coronary artery, sequential saphenous vein graft to the obtuse marginal and distal left circumflex coronary artery, and sequential saphenous vein graft sewn to the acute marginal, posterior descending coronary artery and  right posterolateral branch. The patient states that he recovered from his surgery uneventfully and did well for many years. He has been followed by Dr. [name] and underwent catheterization 2007 which showed that all of his grafts were patent. In 2016 he developed chest pain and repeat cath revealed continued patency of all the grafts but 90% stenosis in the terminal segment of the sequential saphenous vein graft to the circumflex system. This was stented with a drug-eluting stent. Ever since then the patient has continued to have episodes of chest pain. Symptoms have been somewhat atypical. A time symptoms have been thought to be related to eating spicy foods. He underwent cholecystectomy in January 2017, but the patient's chest pain persisted. He underwent EGD revealing Barrett's esophagus but no evidence of active esophagitis. He takes Protonix regularly. The patient has continued to have episodes of chest pain that occur both with activity and at rest. Symptoms are typically relieved by sublingual nitroglycerin. In December 2016 the patient had a syncopal episode. Echocardiogram performed at that time revealed ejection fraction estimated 30-35% with moderate to severe aortic stenosis. Mean transvalvular gradient was reported 24 mmHg with aortic valve area calculated 0.8-0.9. He was noted to have left bundle branch block. He underwent placement of a biventricular pacemaker ICD for CRT. Symptoms have persisted. Recent follow-up transthoracic echocardiogram performed 11/20/2015 revealed somewhat increased gradient across the aortic valve with peak velocity across the aortic valve measured 3.4 m/s corresponding to mean transvalvular gradient estimated between 27 and 31 mmHg. The DVI was reported 0.23. Left ventricular ejection fraction was estimated 35%. The patient subsequently underwent dobutamine stress echocardiography confirming the presence of low gradient low ejection fraction severe aortic stenosis with  increased ejection fraction and increased transvalvular gradient estimated 41 mmHg at peak dobutamine infusion. The patient subsequently underwent  left and right heart catheterization on 01/22/2016. The patient had severe native coronary artery disease with 100% chronic occlusion of essentially all of the proximal coronary arteries. There remained continued patency of all the previously placed bypass grafts, including the stented segment of the vein graft to the left circumflex system. Hemodynamic findings at catheterization revealed peak to peak and mean transvalvular gradient across the aortic valve measured 27 and 20.8 mmHg, respectively. The aortic valve area was calculated 1.29 centimeters squared. Pulmonary artery pressures were normal. The patient was referred for surgical consultation.  The patient has been seen in consultation and counseled at length regarding the indications, risks and potential benefits of surgery.  All questions have been answered, and the patient provides full informed consent for the operation as described.    DETAILS OF THE OPERATIVE PROCEDURE  PREPARATION:    The patient is brought to the operating room on the above mentioned date and central monitoring was established by the anesthesia team including placement of central line and radial arterial line. The patient is placed in the supine position on the operating table.  Intravenous antibiotics are administered.   General endotracheal anesthesia is induced uneventfully.  A Foley catheter is placed.  Baseline transesophageal echocardiogram was performed. The patient's chest, abdomen, both groins, and both lower extremities are prepared and draped in a sterile manner. A time out procedure is performed.   PERIPHERAL ACCESS:    Using the modified Seldinger technique, femoral arterial and venous access was obtained with placement of 6 Fr sheaths on the left side.  A pigtail diagnostic catheter was passed through the left  arterial sheath under fluoroscopic guidance into the aortic root.  A temporary transvenous pacemaker catheter was passed through the left femoral venous sheath under fluoroscopic guidance into the right ventricle.  The pacemaker was tested to ensure stable lead placement and pacemaker capture. Aortic root angiography was performed in order to determine the optimal angiographic angle for valve deployment.   TRANSFEMORAL ACCESS:   Percutaneous transfemoral access and sheath placement was performed by Dr Burt Knack. Please see his separate operative note for details. The patient was heparinized systemically and ACT verified > 250 seconds.    A 24 Fr transfemoral E-sheath was introduced into the right femoral artery after progressively dilating over an Amplatz superstiff wire. An AL-2 catheter was used to direct a straight-tip exchange length wire across the native aortic valve into the left ventricle. This was exchanged out for a pigtail catheter and position was confirmed in the LV apex. Simultaneous LV and Ao pressures were recorded.  The pigtail catheter was exchanged for an Amplatz Extra-stiff wire in the LV apex.  Echocardiography was utilized to confirm appropriate wire position and no sign of entanglement in the mitral subvalvular apparatus.   TRANSCATHETER HEART VALVE DEPLOYMENT:   An Edwards Sapien 3 transcatheter heart valve (size 26 mm, model #9600TFX, serial [id number]) was prepared and crimped per manufacturer's guidelines, and the proper orientation of the valve is confirmed on the [employer] delivery system. The valve was advanced through the introducer sheath using normal technique until in an appropriate position in the abdominal aorta beyond the sheath tip. The balloon was then retracted and using the fine-tuning wheel was centered on the valve. The valve was then advanced across the aortic arch using appropriate flexion of the catheter. The valve was carefully positioned across the  aortic valve annulus. The Commander catheter was retracted using normal technique. Once final position of the valve has been confirmed  by angiographic assessment, the valve is deployed while temporarily holding ventilation and during rapid ventricular pacing to maintain systolic blood pressure < 50 mmHg and pulse pressure < 10 mmHg. The balloon inflation is held for >3 seconds after reaching full deployment volume. Once the balloon has fully deflated the balloon is retracted into the ascending aorta and valve function is assessed using echocardiography. There is felt to be mild paravalvular leak and no central aortic insufficiency.  The patient's hemodynamic recovery following valve deployment is good.  An additional 1.0 mL of saline was inserted into the balloon and the balloon advanced back across the valve.  The valve was post-dilated during rapid ventricular pacing.  The deployment balloon and guidewire are both removed. Echo demostrated acceptable post-procedural gradients, stable mitral valve function, and improved aortic insufficiency.    PROCEDURE COMPLETION:   The sheath was removed and femoral artery closure performed by Dr Burt Knack. Please see his separate report for details.  Protamine was administered once femoral arterial repair was complete. The temporary pacemaker, pigtail catheters and femoral sheaths were removed with manual pressure used for hemostasis.   The patient tolerated the procedure well and is transported to the surgical intensive care in stable condition. There were no immediate intraoperative complications. All sponge instrument and needle counts are verified correct at completion of the operation.   No blood products were administered during the operation.  The patient received a total of 70.7 mL of intravenous contrast during the procedure.   Rexene Alberts, MD 02/18/2016 12:54 PM

## 2016-02-18 NOTE — Interval H&P Note (Signed)
History and Physical Interval Note:  02/18/2016 9:54 AM  [name]  has presented today for surgery, with the diagnosis of SEVERE AS  The various methods of treatment have been discussed with the patient and family. After consideration of risks, benefits and other options for treatment, the patient has consented to  Procedure(s): TRANSCATHETER AORTIC VALVE REPLACEMENT, TRANSFEMORAL (N/A) TRANSESOPHAGEAL ECHOCARDIOGRAM (TEE) (N/A) as a surgical intervention .  The patient's history has been reviewed, patient examined, no change in status, stable for surgery.  I have reviewed the patient's chart and labs.  Questions were answered to the patient's satisfaction.     Rexene Alberts

## 2016-02-18 NOTE — Anesthesia Procedure Notes (Signed)
Procedure Name: Intubation Date/Time: 02/18/2016 11:07 AM Performed by: Sampson Si E Pre-anesthesia Checklist: Patient identified, Emergency Drugs available, Suction available and Patient being monitored Patient Re-evaluated:Patient Re-evaluated prior to inductionOxygen Delivery Method: Circle System Utilized Preoxygenation: Pre-oxygenation with 100% oxygen Intubation Type: IV induction Ventilation: Mask ventilation without difficulty Laryngoscope Size: Mac and 3 Grade View: Grade IV Tube type: Oral Number of attempts: 1 Airway Equipment and Method: Stylet Placement Confirmation: ETT inserted through vocal cords under direct vision,  positive ETCO2 and breath sounds checked- equal and bilateral Secured at: 22 cm Tube secured with: Tape Dental Injury: Teeth and Oropharynx as per pre-operative assessment  Difficulty Due To: Difficulty was unanticipated and Difficult Airway- due to anterior larynx Future Recommendations: Recommend- induction with short-acting agent, and alternative techniques readily available

## 2016-02-18 NOTE — Progress Notes (Signed)
  Echocardiogram Echocardiogram Transesophageal has been performed.  Bobbye Charleston 02/18/2016, 12:32 PM

## 2016-02-18 NOTE — Progress Notes (Signed)
eLink Physician-Brief Progress Note Patient Name: [name] DOB: 1945/05/08 MRN: [id number]   Date of Service  02/18/2016  HPI/Events of Note  40 M admitted after TAVR. RN concerned about shivering vs seizure. Pt is extubated, no respiratory distress, fully awake and alert. Suspect this is shivering and not seizure  eICU Interventions  Care planned reviewed. Nothing added or changed. Will monitor     Intervention Category Evaluation Type: New Patient Evaluation  Wilhelmina Mcardle 02/18/2016, 3:52 PM

## 2016-02-18 NOTE — Anesthesia Preprocedure Evaluation (Addendum)
Anesthesia Evaluation  Patient identified by MRN, date of birth, ID band Patient awake    Reviewed: Allergy & Precautions, NPO status , Patient's Chart, lab work & pertinent test results  Airway Mallampati: I  TM Distance: >3 FB Neck ROM: Full    Dental  (+) Teeth Intact, Poor Dentition, Missing, Dental Advisory Given, Partial Upper, Partial Lower   Pulmonary sleep apnea , former smoker,    Pulmonary exam normal        Cardiovascular hypertension, + CAD and + CABG  Normal cardiovascular exam+ Cardiac Defibrillator + Valvular Problems/Murmurs AS      Neuro/Psych    GI/Hepatic GERD  Controlled and Medicated,  Endo/Other    Renal/GU      Musculoskeletal   Abdominal   Peds  Hematology   Anesthesia Other Findings   Reproductive/Obstetrics                           Anesthesia Physical Anesthesia Plan  ASA: III  Anesthesia Plan: General   Post-op Pain Management:    Induction: Intravenous  Airway Management Planned: Oral ETT  Additional Equipment: Arterial line, CVP and Ultrasound Guidance Line Placement  Intra-op Plan:   Post-operative Plan: Extubation in OR  Informed Consent: I have reviewed the patients History and Physical, chart, labs and discussed the procedure including the risks, benefits and alternatives for the proposed anesthesia with the patient or authorized representative who has indicated his/her understanding and acceptance.     Plan Discussed with: CRNA and Surgeon  Anesthesia Plan Comments:         Anesthesia Quick Evaluation

## 2016-02-18 NOTE — Anesthesia Procedure Notes (Addendum)
Central Venous Catheter Insertion Performed by: Lillia Abed, anesthesiologist Start/End2/06/2016 9:30 AM, 02/18/2016 9:40 AM Preanesthetic checklist: patient identified, IV checked, site marked, risks and benefits discussed, surgical consent, monitors and equipment checked, pre-op evaluation, timeout performed and anesthesia consent Position: Trendelenburg Lidocaine 1% used for infiltration and patient sedated Hand hygiene performed  and maximum sterile barriers used  Central line was placed.Double lumen Procedure performed using ultrasound guided technique. Ultrasound Notes:anatomy identified, needle tip was noted to be adjacent to the nerve/plexus identified, no ultrasound evidence of intravascular and/or intraneural injection and image(s) printed for medical record Attempts: 1

## 2016-02-18 NOTE — Anesthesia Postprocedure Evaluation (Signed)
Anesthesia Post Note  Patient: NERO BURKE  Procedure(s) Performed: Procedure(s) (LRB): TRANSCATHETER AORTIC VALVE REPLACEMENT, TRANSFEMORAL (N/A) TRANSESOPHAGEAL ECHOCARDIOGRAM (TEE) (N/A)  Patient location during evaluation: PACU Anesthesia Type: General Level of consciousness: awake and alert Pain management: pain level controlled Vital Signs Assessment: post-procedure vital signs reviewed and stable Respiratory status: spontaneous breathing, nonlabored ventilation, respiratory function stable and patient connected to nasal cannula oxygen Cardiovascular status: blood pressure returned to baseline and stable Postop Assessment: no signs of nausea or vomiting Anesthetic complications: no       Last Vitals:  Vitals:   02/18/16 1430 02/18/16 1445  BP:    Pulse: 63 66  Resp: 16 (!) 21  Temp:      Last Pain:  Vitals:   02/18/16 1325  TempSrc: Oral  PainSc: 0-No pain                 Malajah Oceguera DAVID

## 2016-02-18 NOTE — Progress Notes (Signed)
Patient ID: [name], male   DOB: December 08, 1945, 71 y.o.   MRN: [id number] SICU Evening Rounds:  Hemodynamically stable in paced rhythm  Urine output good  Awake and alert.  Groin sites ok

## 2016-02-19 ENCOUNTER — Inpatient Hospital Stay (HOSPITAL_COMMUNITY): Payer: Medicare Other

## 2016-02-19 ENCOUNTER — Encounter (HOSPITAL_COMMUNITY): Payer: Self-pay | Admitting: Cardiovascular Disease

## 2016-02-19 ENCOUNTER — Other Ambulatory Visit: Payer: Self-pay

## 2016-02-19 DIAGNOSIS — Z952 Presence of prosthetic heart valve: Secondary | ICD-10-CM

## 2016-02-19 DIAGNOSIS — I350 Nonrheumatic aortic (valve) stenosis: Secondary | ICD-10-CM

## 2016-02-19 DIAGNOSIS — Z954 Presence of other heart-valve replacement: Secondary | ICD-10-CM

## 2016-02-19 DIAGNOSIS — R079 Chest pain, unspecified: Secondary | ICD-10-CM

## 2016-02-19 LAB — BASIC METABOLIC PANEL
ANION GAP: 10 (ref 5–15)
BUN: 13 mg/dL (ref 6–20)
CO2: 25 mmol/L (ref 22–32)
Calcium: 8.5 mg/dL — ABNORMAL LOW (ref 8.9–10.3)
Chloride: 103 mmol/L (ref 101–111)
Creatinine, Ser: 0.85 mg/dL (ref 0.61–1.24)
GFR calc Af Amer: >60 mL/min (ref >60)
GFR calc non Af Amer: >60 mL/min (ref >60)
GLUCOSE: 117 mg/dL — AB (ref 65–99)
POTASSIUM: 3.4 mmol/L — AB (ref 3.5–5.1)
Sodium: 138 mmol/L (ref 135–145)

## 2016-02-19 LAB — CBC
HEMATOCRIT: 35.5 % — AB (ref 39.0–52.0)
Hemoglobin: 11.9 g/dL — ABNORMAL LOW (ref 13.0–17.0)
MCH: 30.1 pg (ref 26.0–34.0)
MCHC: 33.5 g/dL (ref 30.0–36.0)
MCV: 89.6 fL (ref 78.0–100.0)
Platelets: 130 10*3/uL — ABNORMAL LOW (ref 150–400)
RBC: 3.96 MIL/uL — ABNORMAL LOW (ref 4.22–5.81)
RDW: 12.7 % (ref 11.5–15.5)
WBC: 10.7 10*3/uL — AB (ref 4.0–10.5)

## 2016-02-19 LAB — MAGNESIUM: Magnesium: 1.5 mg/dL — ABNORMAL LOW (ref 1.7–2.4)

## 2016-02-19 LAB — ECHOCARDIOGRAM LIMITED
HEIGHTINCHES: 70 in
WEIGHTICAEL: 3467.39 [oz_av]

## 2016-02-19 MED ORDER — SODIUM CHLORIDE 0.9 % IV SOLN
30.0000 meq | Freq: Once | INTRAVENOUS | Status: AC
  Administered 2016-02-19: 30 meq via INTRAVENOUS
  Filled 2016-02-19: qty 15

## 2016-02-19 MED ORDER — RAMIPRIL 10 MG PO CAPS
10.0000 mg | ORAL_CAPSULE | Freq: Every day | ORAL | Status: DC
  Administered 2016-02-20: 10 mg via ORAL
  Filled 2016-02-19: qty 1

## 2016-02-19 NOTE — Progress Notes (Addendum)
    Subjective:  Doing fine this am. States he had some chest pain overnight but felt like it was reflux. No symptoms this am. No shortness of breath.   Objective:  Vital Signs in the last 24 hours: Temp:  [96.6 F (35.9 C)-100.9 F (38.3 C)] 98.6 F (37 C) (02/07 0700) Pulse Rate:  [52-91] 71 (02/07 0700) Resp:  [13-26] 15 (02/07 0700) BP: (126-182)/(58-86) 132/66 (02/07 0700) SpO2:  [91 %-100 %] 94 % (02/07 0700) Arterial Line BP: (143)/(48) 143/48 (02/07 0700) Weight:  [210 lb (95.3 kg)-216 lb 11.4 oz (98.3 kg)] 216 lb 11.4 oz (98.3 kg) (02/07 0600)  Intake/Output from previous day: 02/06 0701 - 02/07 0700 In: 2904.7 [P.O.:360; I.V.:2229.7; IV Piggyback:315] Out: 2700 [Urine:2650; Blood:50]  Physical Exam: Pt is alert and oriented, NAD HEENT: normal Neck: JVP - normal Lungs: CTA bilaterally CV: RRR with 2/6 SEM at the RUSB Abd: soft, NT, Positive BS, no hepatomegaly Ext: no C/C/E, bilateral groin sites clear Skin: warm/dry no rash   Lab Results:  Recent Labs  02/18/16 1327 02/18/16 1338 02/19/16 0434  WBC 6.8  --  10.7*  HGB 12.3* 11.9* 11.9*  PLT 140*  --  130*    Recent Labs  02/18/16 1201 02/18/16 1338 02/19/16 0434  NA 141 142 138  K 3.9 3.7 3.4*  CL 105  --  103  CO2  --   --  25  GLUCOSE 104* 93 117*  BUN 18  --  13  CREATININE 0.70  --  0.85   No results for input(s): TROPONINI in the last 72 hours.  Invalid input(s): CK, MB  Cardiac Studies: POD #1 echo pending today  Tele: Paced rhythm, single PVC's  Assessment/Plan:  1. Acute on chronic systolic HF, multifactorial (valvular disease, ischemic disease, hypertension) - appears euvolemic after TAVR, will increase ACE today. 2. Severe low-gradient AS now POD #1 from TAVR progressing well 3. HTN, uncontrolled: continue isosorbide and toprol XL, increase ramipril to 10 mg daily. Wean IV NTG as tolerated 4. CAD, native vessel: continue medical therapy.   Dispo: echo today, wean NTG as  tolerated, tx 2W when he is off of NTG  Sherren Mocha, M.D. 02/19/2016, 8:05 AM

## 2016-02-19 NOTE — Progress Notes (Addendum)
      [address]       [place] [address]             [phone]        CARDIOTHORACIC SURGERY PROGRESS NOTE   R1 Day Post-Op Procedure(s) (LRB): TRANSCATHETER AORTIC VALVE REPLACEMENT, TRANSFEMORAL (N/A) TRANSESOPHAGEAL ECHOCARDIOGRAM (TEE) (N/A)  Subjective: Doing well.  One episode SSCP last night w/out SOB.  Feels well this am  Objective: Vital signs: BP Readings from Last 1 Encounters:  02/19/16 132/66   Pulse Readings from Last 1 Encounters:  02/19/16 71   Resp Readings from Last 1 Encounters:  02/19/16 15   Temp Readings from Last 1 Encounters:  02/19/16 98.6 F (37 C) (Oral)    Hemodynamics:    Physical Exam:  Rhythm:   paced  Breath sounds: clear  Heart sounds:  RRR w/out murmur  Incisions:  Both groins okay  Abdomen:  Soft, non-distended, non-tender  Extremities:  Warm, well-perfused    Intake/Output from previous day: 02/06 0701 - 02/07 0700 In: 2904.7 [P.O.:360; I.V.:2229.7; IV Piggyback:315] Out: 2700 [Urine:2650; Blood:50] Intake/Output this shift: No intake/output data recorded.  Lab Results:  CBC: Recent Labs  02/18/16 1327 02/18/16 1338 02/19/16 0434  WBC 6.8  --  10.7*  HGB 12.3* 11.9* 11.9*  HCT 36.1* 35.0* 35.5*  PLT 140*  --  130*    BMET:  Recent Labs  02/18/16 1201 02/18/16 1338 02/19/16 0434  NA 141 142 138  K 3.9 3.7 3.4*  CL 105  --  103  CO2  --   --  25  GLUCOSE 104* 93 117*  BUN 18  --  13  CREATININE 0.70  --  0.85  CALCIUM  --   --  8.5*     PT/INR:   Recent Labs  02/18/16 1327  LABPROT 15.2  INR 1.19    CBG (last 3)  No results for input(s): GLUCAP in the last 72 hours.  ABG    Component Value Date/Time   PHART 7.357 02/18/2016 1329   PCO2ART 48.8 (H) 02/18/2016 1329   PO2ART 84.0 02/18/2016 1329   HCO3 27.5 02/18/2016 1329   TCO2 29 02/18/2016 1329   O2SAT 96.0 02/18/2016 1329    CXR: Clear  EKG: Paced rhythm w/out acute ischemic changes      Assessment/Plan: S/P Procedure(s) (LRB): TRANSCATHETER AORTIC VALVE REPLACEMENT, TRANSFEMORAL (N/A) TRANSESOPHAGEAL ECHOCARDIOGRAM (TEE) (N/A)  Doing well s/p TAVR Maintaining AV paced rhythm w/ stable BP - currently on low dose NTG for HTN Breathing comfortably w/ O2 sats 94-96% on RA Atypical chest pain, present pre and post op - possibly due to GERD Hypertension Acute on chronic combined systolic and diastolic CHF   Agree w/ plan outlined by Dr. Burt Knack  Anticipate transfer to 2W once BP  Resume DAPT  Possible d/c home 1-2 days  I spent in excess of 15 minutes during the conduct of this hospital encounter and >50% of this time involved direct face-to-face encounter with the patient for counseling and/or coordination of their care.   Rexene Alberts, MD 02/19/2016 8:15 AM

## 2016-02-19 NOTE — Plan of Care (Signed)
Problem: Phase I Progression Outcomes Goal: Hemodynamically stable Outcome: Progressing Pt on NTG gtt

## 2016-02-19 NOTE — Progress Notes (Signed)
  Echocardiogram 2D Echocardiogram has been performed.  Darlina Sicilian M 02/19/2016, 1:12 PM

## 2016-02-20 LAB — TYPE AND SCREEN
BLOOD PRODUCT EXPIRATION DATE: 201802222359
Blood Product Expiration Date: 201802222359
ISSUE DATE / TIME: 201802061043
ISSUE DATE / TIME: 201802061043
UNIT TYPE AND RH: 6200
UNIT TYPE AND RH: 6200

## 2016-02-20 LAB — BASIC METABOLIC PANEL
Anion gap: 8 (ref 5–15)
BUN: 13 mg/dL (ref 6–20)
CALCIUM: 8.8 mg/dL — AB (ref 8.9–10.3)
CO2: 28 mmol/L (ref 22–32)
CREATININE: 0.77 mg/dL (ref 0.61–1.24)
Chloride: 102 mmol/L (ref 101–111)
GFR calc Af Amer: >60 mL/min (ref >60)
GFR calc non Af Amer: >60 mL/min (ref >60)
GLUCOSE: 110 mg/dL — AB (ref 65–99)
Potassium: 3.5 mmol/L (ref 3.5–5.1)
SODIUM: 138 mmol/L (ref 135–145)

## 2016-02-20 MED ORDER — CLOPIDOGREL BISULFATE 75 MG PO TABS: 75.0000 mg | ORAL_TABLET | Freq: Every day | ORAL | 11 refills | Status: DC

## 2016-02-20 MED ORDER — RAMIPRIL 10 MG PO CAPS: 10.0000 mg | ORAL_CAPSULE | Freq: Every day | ORAL | 5 refills | Status: DC

## 2016-02-20 NOTE — Care Management Note (Addendum)
Case Management Note  Patient Details  Name: [name] MRN: [id number] Date of Birth: 1945/11/17  Subjective/Objective:   S/p TAVR                  Action/Plan:  Pt is from home with wife.  CM will continue to follow for discharge needs.   Expected Discharge Date:  02/20/16               Expected Discharge Plan:  Home/Self Care  In-House Referral:     Discharge planning Services  CM Consult  Post Acute Care Choice:    Choice offered to:     DME Arranged:    DME Agency:     HH Arranged:    HH Agency:     Status of Service:  In process, will continue to follow  If discussed at Long Length of Stay Meetings, dates discussed:    Additional Comments: Wife at bedside.  Pt is ready for discharge without barriers to discharge home.  Pt confirmed he has PCP that he sees on a regular basis in additon to cardiologist and denied barriers to obtaining medications. Pt  completely independent - ambulating around the room.  No CM needs determined prior to discharge Maryclare Labrador, RN 02/20/2016, 8:26 AM

## 2016-02-20 NOTE — Discharge Summary (Signed)
Discharge Summary    Patient ID: [name],  MRN: [id number], DOB/AGE: 1945-06-14 71 y.o.  Admit date: 02/18/2016 Discharge date: 02/20/2016  Primary Care Provider: Pcp Not In System Primary Cardiologist: Dr. [name] [facility]: Dr. Burt Knack Dr. Roxy Manns   Discharge Diagnoses    Principal Problem:   S/P TAVR (transcatheter aortic valve replacement) Active Problems:   Coronary artery disease   History of hypertension   Gastroesophageal reflux disease   LBBB (left bundle branch block)   Coronary artery disease involving coronary bypass graft of native heart with angina pectoris (HCC)   Angina pectoris (HCC)   Aortic stenosis   Chronic systolic CHF (congestive heart failure) (HCC)   S/P CABG x 7   Coronary artery disease involving autologous artery coronary bypass graft with angina pectoris ([place])   Coronary artery disease involving native heart with angina pectoris (HCC)   Severe aortic stenosis   Allergies Allergies  Allergen Reactions  . No Known Allergies     Diagnostic Studies/Procedures    Echo 02/19/2016: Study Conclusions  - Left ventricle: The cavity size was normal. Systolic function was mildly to moderately reduced. The estimated ejection fraction was in the range of 40% to 45%. Diffuse hypokinesis. Doppler parameters are consistent with abnormal left ventricular relaxation (grade 1 diastolic dysfunction). Doppler parameters are consistent with high ventricular filling pressure. - Aortic valve: A 26 mm Edwards Sapien 3 transcatheter bioprosthesis was present. There was mild perivalvular regurgitation. Valve area (VTI): 1.96 cm^2. Valve area (Vmax): 2.01 cm^2. Valve area (Vmean): 1.82 cm^2. - Mitral valve: Transvalvular velocity was within the normal range. There was no evidence for stenosis. There was trivial regurgitation. - Right ventricle: The cavity size was normal. Wall thickness was normal. Systolic function was normal. -  Tricuspid valve: There was mild regurgitation. - Pulmonary arteries: Systolic pressure was within the normal range. PA peak pressure: 30 mm Hg (S).    History of Present Illness     71 y/o male, followed by Dr. [name], with a h/o CAD and aortic stenosis.  He has a known history of coronary disease and is status post CABG in 1996. This included an LIMA graft to the LAD and diagonal, sequential vein graft to the acute marginal, PDA, and posterior lateral branches of the right coronary, sequential saphenous vein graft to the obtuse marginal vessel and distal circumflex. He had a cardiac catheterization in 2007 which showed that his grafts were all patent. On 04/02/14 he had repeat cath for chest pain and grafts were again patent but there was a 90% stenosis in the SVG to OM2. This was stented with a DES.   In Dec 2016,  he was admitted for evaluation of syncope. Echo showed EF 30-35% with moderate to severe AS. Mean gradient 24 mm Hg. AVA 0.8-0.9. There was concern that AVA was underestimated due to low output. EEG was negative. He had a LBBB. He underwent placement of a BIV-ICD by Dr. Lovena Le.   Recently he had a repeat Echo showing some increase in AV gradient. He underwent a Dobutamine Echo. This showed improvement in EF to 40-45%. There was also increase in AV gradient with a mean gradient of 41 mm Hg and severe AS. Decision was made to proceed with TAVR.   Hospital Course     Pt [facility]16 for the planned procedure. He underwent successful transcatheter aortic valve replacement - percutaneous  transfemoral approach with edwards Sapien 3 THV (size 26 mm, model #  9600TFX, serial # [id number]), per Dr. Burt Knack and Dr. Roxy Manns. He tolerated the procedure well. Post procedure echo showed normal function of his transcatheter heart valve with mild paravalvular regurgitation. He was placed on ASA and Plavix. He had no post procedure complications. His ramipril was increased however given elevated  BP, from 5 mg to 10 mg daily. He was continued on Toprol XL and Imdur. He was last seen and examined by Dr. Burt Knack, who determined he was stable for d/c home. He will f/u with an APP in 1 week. He has an appt scheduled for a 30 day echo on 03/13/16, followed by clinic appt with Dr. Copper that same day.     Consultants: none    Discharge Vitals Blood pressure (!) 155/82, pulse 69, temperature 98.9 F (37.2 C), temperature source Oral, resp. rate (!) 26, height 5\' 10"  (1.778 m), weight 216 lb 7.9 oz (98.2 kg), SpO2 100 %.  Filed Weights   02/18/16 0828 02/19/16 0600 02/20/16 0600  Weight: 210 lb (95.3 kg) 216 lb 11.4 oz (98.3 kg) 216 lb 7.9 oz (98.2 kg)    Labs & Radiologic Studies    CBC  Recent Labs  02/18/16 1327 02/18/16 1338 02/19/16 0434  WBC 6.8  --  10.7*  HGB 12.3* 11.9* 11.9*  HCT 36.1* 35.0* 35.5*  MCV 89.1  --  89.6  PLT 140*  --  [id number]   Basic Metabolic Panel  Recent Labs  02/19/16 0434 02/20/16 0922  NA 138 138  K 3.4* 3.5  CL 103 102  CO2 25 28  GLUCOSE 117* 110*  BUN 13 13  CREATININE 0.85 0.77  CALCIUM 8.5* 8.8*  MG 1.5*  --    Liver Function Tests No results for input(s): AST, ALT, ALKPHOS, BILITOT, PROT, ALBUMIN in the last 72 hours. No results for input(s): LIPASE, AMYLASE in the last 72 hours. Cardiac Enzymes No results for input(s): CKTOTAL, CKMB, CKMBINDEX, TROPONINI in the last 72 hours. BNP Invalid input(s): POCBNP D-Dimer No results for input(s): DDIMER in the last 72 hours. Hemoglobin A1C No results for input(s): HGBA1C in the last 72 hours. Fasting Lipid Panel No results for input(s): CHOL, HDL, LDLCALC, TRIG, CHOLHDL, LDLDIRECT in the last 72 hours. Thyroid Function Tests No results for input(s): TSH, T4TOTAL, T3FREE, THYROIDAB in the last 72 hours.  Invalid input(s): FREET3 _____________  Dg Chest 2 View  Result Date: 02/14/2016 CLINICAL DATA:  Aortic stenosis. Preadmission for TAVR. CABG x7. CHF. EXAM: CHEST  2 VIEW  COMPARISON:  CT of 02/03/2016 FINDINGS: Prior median sternotomy. Moderate thoracic spondylosis. Pacer/AICD device. Midline trachea. Moderate cardiomegaly with transverse aortic atherosclerosis. No pleural effusion or pneumothorax. No congestive failure. Clear lungs. IMPRESSION: Cardiomegaly without congestive failure. Electronically Signed   By: Abigail Miyamoto M.D.   On: 02/14/2016 15:42   Ct Coronary Morph W/cta Cor W/score W/ca W/cm &/or Wo/cm  Addendum Date: 02/04/2016   ADDENDUM REPORT: 02/04/2016 12:19 ADDENDUM: Please see separate dictation for contemporaneously obtained CTA of the chest, abdomen and pelvis dated 02/03/2016 for full description of extracardiac findings. Electronically Signed   By: Vinnie Langton M.D.   On: 02/04/2016 12:19   Result Date: 02/04/2016 CLINICAL DATA:  Aortic stenosis EXAM: Cardiac TAVR CT TECHNIQUE: The patient was scanned on a Philips 256 scanner. A 120 kV retrospective scan was triggered in the descending thoracic aorta at 111 HU's. Gantry rotation speed was 270 msecs and collimation was .9 mm. No beta blockade or nitro were given. The 3D data set  was reconstructed in 5% intervals of the R-R cycle. Systolic and diastolic phases were analyzed on a dedicated work station using MPR, MIP and VRT modes. The patient received 80 cc of contrast. FINDINGS: Aortic Valve:  Trileaflet and calcified with restricted motion. Aorta: Moderate calcification of the STJ ascending root, arch and descending thoracic aorta. No aneurysm or heavy plaque. Normal take off of arch vessels Sinotubular Junction:  25 mm Ascending Thoracic Aorta:  34 mm Aortic Arch:  25 mm Descending Thoracic Aorta:  27 mm Sinus of Valsalva Measurements: Non-coronary:  33 mm Right -coronary:  31 mm Left -coronary:  31 mm Coronary Artery Height above Annulus: Left Main:  15 mm above annulus Right Coronary:  12.5 mm above annulus Virtual Basal Annulus Measurements: Maximum/Minimum Diameter:  28.7 mm x 20.5 mm Perimeter:   82 mm Area:  480 cm2 Coronary Arteries: Sufficient height above annulus but native arteries known to be occluded. Patent SVG to RCA and OM, Patent LIMA to LAD Optimum Fluoroscopic Angle for Delivery: LAO 22 degrees Cr 3 degrees IMPRESSION: 1) Calcified trileaflet aortic valve with annulus suitable for a 26 mm Sapien 3 valve 2) Native coronaries sufficient height for delivery with patient SVG to RCA, OM and patent LIMA to LAD 3) Calcified ascending aorta, STJ and thoracic aorta with no aneurysm 4) Optimum angle for delivery LAO 22 degrees Cranial 3 degrees 5) Biventricular AICD wires in place [employer] Electronically Signed: By: Jenkins Rouge M.D. On: 02/03/2016 14:00   Dg Chest Port 1 View  Result Date: 02/19/2016 CLINICAL DATA:  Post TAVR.  Chest soreness EXAM: PORTABLE CHEST 1 VIEW COMPARISON:  02/18/2016 FINDINGS: Changes of aortic valve repair. Left pacer/defibrillator remains in place, unchanged. Stable position of the right internal jugular central line. Cardiomegaly. No confluent opacities, effusions or edema. IMPRESSION: Cardiomegaly.  No active disease. Electronically Signed   By: Rolm Baptise M.D.   On: 02/19/2016 08:30   Dg Chest Port 1 View  Result Date: 02/18/2016 CLINICAL DATA:  Status post TAVR. EXAM: PORTABLE CHEST 1 VIEW COMPARISON:  02/14/2016 chest radiograph. FINDINGS: Stable configuration of 3 lead left subclavian ICD and median sternotomy wires. Aortic valve prosthesis is in place. Right internal jugular central venous catheter terminates in the lower third of the superior vena cava. Stable cardiomediastinal silhouette with mild cardiomegaly and aortic atherosclerosis. No pneumothorax. No pleural effusion. Borderline mild pulmonary edema. No consolidative airspace disease. IMPRESSION: 1. No pneumothorax.  Hardware as described. 2. Stable mild cardiomegaly with borderline mild pulmonary edema. 3. Aortic atherosclerosis. Electronically Signed   By: Ilona Sorrel M.D.   On: 02/18/2016  13:42   Ct Angio Chest Aorta W/cm &/or Wo/cm  Result Date: 02/04/2016 CLINICAL DATA:  71 year old male with history of severe aortic stenosis. Preprocedural study prior to potential transcatheter aortic valve replacement (TAVR) procedure. EXAM: CT ANGIOGRAPHY CHEST, ABDOMEN AND PELVIS TECHNIQUE: Multidetector CT imaging through the chest, abdomen and pelvis was performed using the standard protocol during bolus administration of intravenous contrast. Multiplanar reconstructed images and MIPs were obtained and reviewed to evaluate the vascular anatomy. CONTRAST:  70 mL of Isovue 370. COMPARISON:  Chest CT 02/27/2014. CT the abdomen and pelvis 05/24/2013. FINDINGS: CTA CHEST FINDINGS Cardiovascular: Heart size is mildly enlarged. There is no significant pericardial fluid, thickening or pericardial calcification. There is aortic atherosclerosis, as well as atherosclerosis of the great vessels of the mediastinum and the coronary arteries, including calcified atherosclerotic plaque in the left main, left anterior descending, left circumflex and right coronary  arteries. Status post median sternotomy for CABG, including LIMA to the LAD. Profound myocardial thinning in the region of the lateral and inferolateral wall segments from the mid ventricle to the apex, indicative of scarring from prior left circumflex coronary artery territory myocardial infarction. Severe thickening calcification of the aortic valve. Left-sided biventricular pacemaker/AICD with lead tips terminating in the right ventricular apex, right atrial appendage, and overlying the lateral wall the left ventricle via the coronary sinus and coronary veins. Mediastinum/Lymph Nodes: No pathologically enlarged mediastinal or hilar lymph nodes. Multiple densely calcified right hilar and mediastinal lymph nodes. Small hiatal hernia. No axillary lymphadenopathy. Lungs/Pleura: Small calcified granuloma in the right upper lobe. Several tiny 2-3 mm pulmonary  nodules are noted in the right lung, unchanged compared to prior study 02/27/2014, considered benign at this time. No other larger more suspicious appearing pulmonary nodules or masses are noted. No acute consolidative airspace disease. No pleural effusions. Musculoskeletal/Soft Tissues: Median sternotomy wires. There are no aggressive appearing lytic or blastic lesions noted in the visualized portions of the skeleton. CTA ABDOMEN AND PELVIS FINDINGS Hepatobiliary: 1.3 cm low-attenuation lesion and adjacent subcentimeter low-attenuation lesions in segment 2 of the liver are unchanged compared to prior study from 05/24/2013, most compatible with small simple cysts. No other larger more suspicious appearing cystic or solid hepatic lesions. No intra or extrahepatic biliary ductal dilatation. Status post cholecystectomy. Pancreas: No pancreatic mass. No pancreatic ductal dilatation. No pancreatic or peripancreatic fluid or inflammatory changes. Spleen: Unremarkable. Adrenals/Urinary Tract: Multiple low-attenuation lesions in both kidneys, compatible with simple cysts, largest of which measures up to 3.8 cm in diameter in the lower pole of the left kidney. New 8 mm lesion in the upper pole the right kidney (image 184 of series 401) which is too small to characterize, but appears to be intermediate attenuation (44 HU). No hydroureteronephrosis or perinephric stranding to indicate urinary tract obstruction at this time. Urinary bladder is normal in appearance. Bilateral adrenal glands are normal in appearance. Stomach/Bowel: The appearance of the stomach is normal. There is no pathologic dilatation of small bowel or colon. Numerous colonic diverticulae are noted, without surrounding inflammatory changes to suggest an acute diverticulitis at this time. Normal appendix. Vascular/Lymphatic: Extensive aortic atherosclerosis, with vascular findings and measurements pertinent to potential TAVR procedure, as detailed below. No  aneurysm or dissection identified in the abdominal or pelvic vasculature. Single renal arteries bilaterally demonstrate atherosclerotic disease proximally resulting in mild stenosis. Celiac axis, superior mesenteric artery and inferior mesenteric artery all demonstrate normal flow without hemodynamically significant stenosis. No lymphadenopathy noted in the abdomen or pelvis. Reproductive: Prostate gland is mildly enlarged with median lobe hypertrophy. Seminal vesicles are unremarkable in appearance. Other: No significant volume of ascites.  No pneumoperitoneum. Musculoskeletal: There are no aggressive appearing lytic or blastic lesions noted in the visualized portions of the skeleton. VASCULAR MEASUREMENTS PERTINENT TO TAVR: AORTA: Minimal Aortic Diameter -  12 x 14 mm Severity of Aortic Calcification -  severe RIGHT PELVIS: Right Common Iliac Artery - Minimal Diameter - 8.2 x 7.6 mm Tortuosity - moderate to severe Calcification - moderate to severe Right External Iliac Artery - Minimal Diameter - 9.3 x 9.2 mm Tortuosity - moderate Calcification - mild Right Common Femoral Artery - Minimal Diameter - 6.9 x 2.3 mm (mean diameter 4.6 mm) Tortuosity - mild Calcification - severe LEFT PELVIS: Left Common Iliac Artery - Minimal Diameter - 10.2 x 6.7 mm Tortuosity - moderate to severe Calcification - moderate to severe Left External Iliac  Artery - Minimal Diameter - 7.3 x 9.0 mm Tortuosity - moderate Calcification - mild Left Common Femoral Artery - Minimal Diameter - 8.2 x 3.1 mm (mean diameter of 5.7 mm) Tortuosity - mild Calcification - severe Review of the MIP images confirms the above findings. IMPRESSION: 1. Vascular findings and measurements pertinent to potential TAVR procedure, as detailed above. The patient may have suitable arterial access on the left side, but please take note of the asymmetric narrowing of the left common femoral artery related to eccentric calcified plaque with a mean diameter of only 5.7  mm. 2. Severe thickening calcification of the aortic valve, compatible with the reported clinical history of severe aortic stenosis. 3. **An incidental finding of potential clinical significance has been found. New 8 mm lesion in the upper pole the right kidney is technically too small to characterize, but may demonstrate some internal enhancement. Follow-up MRI of the abdomen with and without IV gadolinium is recommended in 6 months to re-evaluate this lesion and exclude neoplasm.** 4. Colonic diverticulosis without evidence of acute diverticulitis at this time. 5. Additional incidental findings, as above. Electronically Signed   By: Vinnie Langton M.D.   On: 02/04/2016 15:19   Ct Angio Abd/pel W/ And/or W/o  Result Date: 02/04/2016 CLINICAL DATA:  71 year old male with history of severe aortic stenosis. Preprocedural study prior to potential transcatheter aortic valve replacement (TAVR) procedure. EXAM: CT ANGIOGRAPHY CHEST, ABDOMEN AND PELVIS TECHNIQUE: Multidetector CT imaging through the chest, abdomen and pelvis was performed using the standard protocol during bolus administration of intravenous contrast. Multiplanar reconstructed images and MIPs were obtained and reviewed to evaluate the vascular anatomy. CONTRAST:  70 mL of Isovue 370. COMPARISON:  Chest CT 02/27/2014. CT the abdomen and pelvis 05/24/2013. FINDINGS: CTA CHEST FINDINGS Cardiovascular: Heart size is mildly enlarged. There is no significant pericardial fluid, thickening or pericardial calcification. There is aortic atherosclerosis, as well as atherosclerosis of the great vessels of the mediastinum and the coronary arteries, including calcified atherosclerotic plaque in the left main, left anterior descending, left circumflex and right coronary arteries. Status post median sternotomy for CABG, including LIMA to the LAD. Profound myocardial thinning in the region of the lateral and inferolateral wall segments from the mid ventricle to the  apex, indicative of scarring from prior left circumflex coronary artery territory myocardial infarction. Severe thickening calcification of the aortic valve. Left-sided biventricular pacemaker/AICD with lead tips terminating in the right ventricular apex, right atrial appendage, and overlying the lateral wall the left ventricle via the coronary sinus and coronary veins. Mediastinum/Lymph Nodes: No pathologically enlarged mediastinal or hilar lymph nodes. Multiple densely calcified right hilar and mediastinal lymph nodes. Small hiatal hernia. No axillary lymphadenopathy. Lungs/Pleura: Small calcified granuloma in the right upper lobe. Several tiny 2-3 mm pulmonary nodules are noted in the right lung, unchanged compared to prior study 02/27/2014, considered benign at this time. No other larger more suspicious appearing pulmonary nodules or masses are noted. No acute consolidative airspace disease. No pleural effusions. Musculoskeletal/Soft Tissues: Median sternotomy wires. There are no aggressive appearing lytic or blastic lesions noted in the visualized portions of the skeleton. CTA ABDOMEN AND PELVIS FINDINGS Hepatobiliary: 1.3 cm low-attenuation lesion and adjacent subcentimeter low-attenuation lesions in segment 2 of the liver are unchanged compared to prior study from 05/24/2013, most compatible with small simple cysts. No other larger more suspicious appearing cystic or solid hepatic lesions. No intra or extrahepatic biliary ductal dilatation. Status post cholecystectomy. Pancreas: No pancreatic mass. No pancreatic ductal dilatation.  No pancreatic or peripancreatic fluid or inflammatory changes. Spleen: Unremarkable. Adrenals/Urinary Tract: Multiple low-attenuation lesions in both kidneys, compatible with simple cysts, largest of which measures up to 3.8 cm in diameter in the lower pole of the left kidney. New 8 mm lesion in the upper pole the right kidney (image 184 of series 401) which is too small to  characterize, but appears to be intermediate attenuation (44 HU). No hydroureteronephrosis or perinephric stranding to indicate urinary tract obstruction at this time. Urinary bladder is normal in appearance. Bilateral adrenal glands are normal in appearance. Stomach/Bowel: The appearance of the stomach is normal. There is no pathologic dilatation of small bowel or colon. Numerous colonic diverticulae are noted, without surrounding inflammatory changes to suggest an acute diverticulitis at this time. Normal appendix. Vascular/Lymphatic: Extensive aortic atherosclerosis, with vascular findings and measurements pertinent to potential TAVR procedure, as detailed below. No aneurysm or dissection identified in the abdominal or pelvic vasculature. Single renal arteries bilaterally demonstrate atherosclerotic disease proximally resulting in mild stenosis. Celiac axis, superior mesenteric artery and inferior mesenteric artery all demonstrate normal flow without hemodynamically significant stenosis. No lymphadenopathy noted in the abdomen or pelvis. Reproductive: Prostate gland is mildly enlarged with median lobe hypertrophy. Seminal vesicles are unremarkable in appearance. Other: No significant volume of ascites.  No pneumoperitoneum. Musculoskeletal: There are no aggressive appearing lytic or blastic lesions noted in the visualized portions of the skeleton. VASCULAR MEASUREMENTS PERTINENT TO TAVR: AORTA: Minimal Aortic Diameter -  12 x 14 mm Severity of Aortic Calcification -  severe RIGHT PELVIS: Right Common Iliac Artery - Minimal Diameter - 8.2 x 7.6 mm Tortuosity - moderate to severe Calcification - moderate to severe Right External Iliac Artery - Minimal Diameter - 9.3 x 9.2 mm Tortuosity - moderate Calcification - mild Right Common Femoral Artery - Minimal Diameter - 6.9 x 2.3 mm (mean diameter 4.6 mm) Tortuosity - mild Calcification - severe LEFT PELVIS: Left Common Iliac Artery - Minimal Diameter - 10.2 x 6.7 mm  Tortuosity - moderate to severe Calcification - moderate to severe Left External Iliac Artery - Minimal Diameter - 7.3 x 9.0 mm Tortuosity - moderate Calcification - mild Left Common Femoral Artery - Minimal Diameter - 8.2 x 3.1 mm (mean diameter of 5.7 mm) Tortuosity - mild Calcification - severe Review of the MIP images confirms the above findings. IMPRESSION: 1. Vascular findings and measurements pertinent to potential TAVR procedure, as detailed above. The patient may have suitable arterial access on the left side, but please take note of the asymmetric narrowing of the left common femoral artery related to eccentric calcified plaque with a mean diameter of only 5.7 mm. 2. Severe thickening calcification of the aortic valve, compatible with the reported clinical history of severe aortic stenosis. 3. **An incidental finding of potential clinical significance has been found. New 8 mm lesion in the upper pole the right kidney is technically too small to characterize, but may demonstrate some internal enhancement. Follow-up MRI of the abdomen with and without IV gadolinium is recommended in 6 months to re-evaluate this lesion and exclude neoplasm.** 4. Colonic diverticulosis without evidence of acute diverticulitis at this time. 5. Additional incidental findings, as above. Electronically Signed   By: Vinnie Langton M.D.   On: 02/04/2016 15:19   Disposition   Pt is being discharged home today in good condition.  Follow-up Plans & Appointments    Follow-up Information    [facility] Follow up on 03/13/2016.   Specialty:  Cardiology Why:  9:30 AM for follow-up ultrasound of your heart Contact information: [address] [place] [id number] [phone]       Sherren Mocha, MD Follow up.   Specialty:  Cardiology Why:  10:30 post hospital follow-up  Contact information: [address] [place]  [address] [phone]          Discharge Instructions    Diet - low sodium heart healthy    Complete by:  As directed    Increase activity slowly    Complete by:  As directed       Discharge Medications   Discharge Medication List as of 02/20/2016 11:40 AM    START taking these medications   Details  clopidogrel (PLAVIX) 75 MG tablet Take 1 tablet (75 mg total) by mouth daily., Starting Fri 02/21/2016, Normal      CONTINUE these medications which have CHANGED   Details  ramipril (ALTACE) 10 MG capsule Take 1 capsule (10 mg total) by mouth daily., Starting Thu 02/20/2016, Normal      CONTINUE these medications which have NOT CHANGED   Details  Ascorbic Acid (VITAMIN C) 1000 MG tablet Take 1,000 mg by mouth daily., Historical Med    aspirin EC 81 MG EC tablet Take 1 tablet (81 mg total) by mouth daily., Starting 03/14/2014, Until Discontinued, OTC    atorvastatin (LIPITOR) 80 MG tablet Take 80 mg by mouth daily., Historical Med    Cholecalciferol (VITAMIN D-3) 5000 units TABS Take 1 tablet by mouth daily., Historical Med    finasteride (PROSCAR) 5 MG tablet Take 5 mg by mouth daily., Historical Med    isosorbide mononitrate (IMDUR) 60 MG 24 hr tablet Take 1 tablet (60 mg total) by mouth daily., Starting Tue 05/14/2015, Normal    metoprolol succinate (TOPROL-XL) 50 MG 24 hr tablet Take 1 tablet (50 mg total) by mouth daily. Take with or immediately following a meal., Starting Tue 05/14/2015, Normal    nitroGLYCERIN (NITROSTAT) 0.4 MG SL tablet Place 1 tablet (0.4 mg total) under the tongue every 5 (five) minutes as needed for chest pain., Starting Fri 02/07/2016, Normal    oxybutynin (DITROPAN) 5 MG tablet Take 5 mg by mouth daily. , Starting 07/04/2012, Until Discontinued, Historical Med    oxyCODONE-acetaminophen (PERCOCET) 5-325 MG tablet Take one or two tablets by mouth every 6 hours as needed for pain., Print    pantoprazole (PROTONIX) 40 MG tablet Take 1 tablet (40 mg total) by  mouth daily., Starting Tue 10/01/2015, Normal    valACYclovir (VALTREX) 500 MG tablet Take 500 mg by mouth daily as needed (outbreak). , Starting 04/10/2013, Until Discontinued, Historical Med      STOP taking these medications     ibuprofen (ADVIL,MOTRIN) 200 MG tablet           Outstanding Labs/Studies   30 day f/u echo, scheduled for 03/13/16   Duration of Discharge Encounter   Greater than 30 minutes including physician time.  Signed, Lyda Jester PA-C  02/20/2016, 1:13 PM

## 2016-02-20 NOTE — Progress Notes (Signed)
[id number] Pt had already walked in unit and ready for discharge so did not walk. Gave ex ed and low sodium diet. Discussed CRP 2 but pt not interested. Will ex on his own. Encouraged IS. Graylon Good RN BSN 02/20/2016 11:55 AM

## 2016-02-20 NOTE — Progress Notes (Signed)
      [address]       [place] [address]             [phone]        CARDIOTHORACIC SURGERY PROGRESS NOTE   R2 Days Post-Op Procedure(s) (LRB): TRANSCATHETER AORTIC VALVE REPLACEMENT, TRANSFEMORAL (N/A) TRANSESOPHAGEAL ECHOCARDIOGRAM (TEE) (N/A)  Subjective: Looks well and feels fine.  Wants to go home  Objective: Vital signs: BP Readings from Last 1 Encounters:  02/20/16 (!) 178/82   Pulse Readings from Last 1 Encounters:  02/19/16 69   Resp Readings from Last 1 Encounters:  02/20/16 17   Temp Readings from Last 1 Encounters:  02/20/16 98.2 F (36.8 C) (Oral)    Hemodynamics:    Physical Exam:  Rhythm:   paced  Breath sounds: clear  Heart sounds:  RRR w/out murmur  Incisions:  Both groins look good  Abdomen:  Soft, non-distended, non-tender  Extremities:  Warm, well-perfused    Intake/Output from previous day: 02/07 0701 - 02/08 0700 In: 38 [P.O.:240; I.V.:6; IV Piggyback:365] Out: 1350 [Urine:1350] Intake/Output this shift: No intake/output data recorded.  Lab Results:  CBC: Recent Labs  02/18/16 1327 02/18/16 1338 02/19/16 0434  WBC 6.8  --  10.7*  HGB 12.3* 11.9* 11.9*  HCT 36.1* 35.0* 35.5*  PLT 140*  --  130*    BMET:  Recent Labs  02/18/16 1201 02/18/16 1338 02/19/16 0434  NA 141 142 138  K 3.9 3.7 3.4*  CL 105  --  103  CO2  --   --  25  GLUCOSE 104* 93 117*  BUN 18  --  13  CREATININE 0.70  --  0.85  CALCIUM  --   --  8.5*     PT/INR:   Recent Labs  02/18/16 1327  LABPROT 15.2  INR 1.19    CBG (last 3)  No results for input(s): GLUCAP in the last 72 hours.  ABG    Component Value Date/Time   PHART 7.357 02/18/2016 1329   PCO2ART 48.8 (H) 02/18/2016 1329   PO2ART 84.0 02/18/2016 1329   HCO3 27.5 02/18/2016 1329   TCO2 29 02/18/2016 1329   O2SAT 96.0 02/18/2016 1329    CXR: n/a  Assessment/Plan: S/P Procedure(s) (LRB): TRANSCATHETER AORTIC VALVE REPLACEMENT, TRANSFEMORAL  (N/A) TRANSESOPHAGEAL ECHOCARDIOGRAM (TEE) (N/A)  Doing well POD2 BP running a little high this morning but hasn't received routine meds for hypertension Anticipate likely d/c home today if BP under adequate control POD1 ECHO looks good w/ stable EF and mild paravalvular leak  Rexene Alberts, MD 02/20/2016 8:39 AM

## 2016-02-20 NOTE — Progress Notes (Signed)
    Subjective:  Feels well this morning. No CP or dyspnea. Has walked around unit without problems.   Objective:  Vital Signs in the last 24 hours: Temp:  [98 F (36.7 C)-99.5 F (37.5 C)] 98.2 F (36.8 C) (02/08 0731) Pulse Rate:  [69-80] 69 (02/07 1300) Resp:  [15-25] 17 (02/08 0655) BP: (130-178)/(56-99) 178/82 (02/08 0655) SpO2:  [92 %-100 %] 100 % (02/07 1520) Arterial Line BP: (154)/(58) 154/58 (02/07 0900) Weight:  [216 lb 7.9 oz (98.2 kg)] 216 lb 7.9 oz (98.2 kg) (02/08 0600)  Intake/Output from previous day: 02/07 0701 - 02/08 0700 In: 611 [P.O.:240; I.V.:6; IV Piggyback:365] Out: 1350 [Urine:1350]  Physical Exam: Pt is alert and oriented, NAD HEENT: normal Neck: JVP - normal Lungs: CTA bilaterally CV: RRR with 2/6 systolic murmur at RUSB, no diastolic murmur Abd: soft, NT, Positive BS, no hepatomegaly Ext: no C/C/E, distal pulses intact and equal Skin: warm/dry no rash   Lab Results:  Recent Labs  02/18/16 1327 02/18/16 1338 02/19/16 0434  WBC 6.8  --  10.7*  HGB 12.3* 11.9* 11.9*  PLT 140*  --  130*    Recent Labs  02/18/16 1201 02/18/16 1338 02/19/16 0434  NA 141 142 138  K 3.9 3.7 3.4*  CL 105  --  103  CO2  --   --  25  GLUCOSE 104* 93 117*  BUN 18  --  13  CREATININE 0.70  --  0.85   No results for input(s): TROPONINI in the last 72 hours.  Invalid input(s): CK, MB  Cardiac Studies: Echo 02/19/2016: Study Conclusions  - Left ventricle: The cavity size was normal. Systolic function was   mildly to moderately reduced. The estimated ejection fraction was   in the range of 40% to 45%. Diffuse hypokinesis. Doppler   parameters are consistent with abnormal left ventricular   relaxation (grade 1 diastolic dysfunction). Doppler parameters   are consistent with high ventricular filling pressure. - Aortic valve: A 26 mm Edwards Sapien 3 transcatheter   bioprosthesis was present. There was mild perivalvular   regurgitation. Valve area  (VTI): 1.96 cm^2. Valve area (Vmax):   2.01 cm^2. Valve area (Vmean): 1.82 cm^2. - Mitral valve: Transvalvular velocity was within the normal range.   There was no evidence for stenosis. There was trivial   regurgitation. - Right ventricle: The cavity size was normal. Wall thickness was   normal. Systolic function was normal. - Tricuspid valve: There was mild regurgitation. - Pulmonary arteries: Systolic pressure was within the normal   range. PA peak pressure: 30 mm Hg (S).  Tele: V-paced, single PVC's  Assessment/Plan:  1. Acute on chronic combined systolic and diastolic heart failure 2. Severe low gradient aortic stenosis, postoperative day #2 from TAVR 3. Hypertension with heart failure 4. Coronary artery disease, native vessel  The patient's echocardiogram is reviewed as above. He has normal function of his transcatheter heart valve with mild paravalvular regurgitation. He will be treated with aspirin and Plavix for at least 6 months. Blood pressure remains elevated. Will see how his blood pressure is after receiving oral antihypertensives this morning. He otherwise has progressed well and is stable for discharge home today. Outpatient follow-up has been arranged for his 30 day echocardiogram and heart valve clinic visit. Ramipril has been increased from 5 to 10 mg and he has been continued on Toprol XL and Imdur at home doses.   Sherren Mocha, M.D. 02/20/2016, 8:37 AM

## 2016-02-21 MED FILL — Sodium Chloride IV Soln 0.9%: INTRAVENOUS | Qty: 250 | Status: AC

## 2016-02-21 MED FILL — Insulin Regular (Human) Inj 100 Unit/ML: INTRAMUSCULAR | Qty: 250 | Status: AC

## 2016-02-21 MED FILL — Phenylephrine HCl Inj 10 MG/ML: INTRAMUSCULAR | Qty: 2 | Status: AC

## 2016-03-13 ENCOUNTER — Ambulatory Visit (INDEPENDENT_AMBULATORY_CARE_PROVIDER_SITE_OTHER): Payer: Medicare Other | Admitting: Cardiovascular Disease

## 2016-03-13 ENCOUNTER — Ambulatory Visit (HOSPITAL_COMMUNITY): Payer: Medicare Other | Attending: Cardiovascular Disease

## 2016-03-13 ENCOUNTER — Encounter: Payer: Self-pay | Admitting: Cardiovascular Disease

## 2016-03-13 ENCOUNTER — Other Ambulatory Visit: Payer: Self-pay

## 2016-03-13 VITALS — BP 110/70 | HR 64 | Ht 70.0 in | Wt 216.1 lb

## 2016-03-13 DIAGNOSIS — I350 Nonrheumatic aortic (valve) stenosis: Secondary | ICD-10-CM

## 2016-03-13 DIAGNOSIS — I2510 Atherosclerotic heart disease of native coronary artery without angina pectoris: Secondary | ICD-10-CM | POA: Diagnosis not present

## 2016-03-13 DIAGNOSIS — Z951 Presence of aortocoronary bypass graft: Secondary | ICD-10-CM | POA: Diagnosis not present

## 2016-03-13 DIAGNOSIS — I5022 Chronic systolic (congestive) heart failure: Secondary | ICD-10-CM | POA: Diagnosis not present

## 2016-03-13 DIAGNOSIS — Z952 Presence of prosthetic heart valve: Secondary | ICD-10-CM | POA: Diagnosis not present

## 2016-03-13 DIAGNOSIS — I447 Left bundle-branch block, unspecified: Secondary | ICD-10-CM | POA: Diagnosis not present

## 2016-03-13 DIAGNOSIS — I110 Hypertensive heart disease with heart failure: Secondary | ICD-10-CM | POA: Diagnosis not present

## 2016-03-13 MED ORDER — RAMIPRIL 5 MG PO CAPS: 5.0000 mg | ORAL_CAPSULE | Freq: Every day | ORAL | 3 refills | Status: DC

## 2016-03-13 NOTE — Patient Instructions (Addendum)
Medication Instructions:  Your physician has recommended you make the following change in your medication:  1. DECREASE Ramipril to 5mg  take one capsule by mouth daily  Labwork: No new orders   Testing/Procedures: Your physician has requested that you have an echocardiogram in 1 YEAR. Echocardiography is a painless test that uses sound waves to create images of your heart. It provides your doctor with information about the size and shape of your heart and how well your heart's chambers and valves are working. This procedure takes approximately one hour. There are no restrictions for this procedure.  Follow-Up: Your physician wants you to follow-up in: 4 MONTHS with Dr [name].  You will receive a reminder letter in the mail two months in advance. If you don't receive a letter, please call our office to schedule the follow-up appointment.  Your physician wants you to follow-up in: 1 YEAR with Dr Burt Knack.  You will receive a reminder letter in the mail two months in advance. If you don't receive a letter, please call our office to schedule the follow-up appointment.  Any Other Special Instructions Will Be Listed Below (If Applicable).  Your physician discussed the importance of taking an antibiotic prior to any dental, gastrointestinal, genitourinary procedures to prevent damage to the heart valves from infection.     If you need a refill on your cardiac medications before your next appointment, please call your pharmacy.

## 2016-03-13 NOTE — Progress Notes (Signed)
Cardiology Office Note Date:  03/14/2016   ID:  [name], DOB 04-08-1945, MRN [id number]  PCP:  Pcp Not In System  Cardiologist:  Peter [name], MD  Chief Complaint  Patient presents with  . TAVR Follow-up   History of Present Illness: [name] is a 71 y.o. male who presents for 30-day TAVR follow-up.   The patient has a long-standing history of coronary artery disease and underwent multivessel CABG in 1996. He has undergone saphenous vein graft intervention in the past. He developed progressive aortic stenosis and worsening of LV systolic function with LVEF declining to 30-35%. The patient has undergone cardiac resynchronization with a biventricular ICD implantation. He was noted to have a mean transvalvular gradient of 31 mmHg prior to TAVR. He underwent uncomplicated TAVR [id number] via a percutaneous right transfemoral approach. He was treated with a 26 mm Edwards Sapien 3 transcatheter heart valve.  He is here with his wife today. He reports generalized fatigue. He had shortness of breath when he was out using a weed eater last week. Otherwise has had no shortness of breath, orthopnea, PND, lightheadedness, chest pain, or syncope. No heart palpitations.  Past Medical History:  Diagnosis Date  . Aortic stenosis   . Barrett's esophageal ulceration   . Chronic systolic CHF (congestive heart failure) ([place])   . Coronary artery disease    a. CABG 1996: LIMA graft to the LAD and diagonal, sequential vein graft to the acute marginal, PDA, and posterior lateral branches of the right coronary, sequential saphenous vein graft to the obtuse marginal vessel and distal circumflex. b. Cath 2007 patent grafts. c. s/p DES to SVG-OM2 in 03/2014.  Marland Kitchen Coronary artery disease involving autologous vein coronary bypass graft with angina pectoris ([place]) 03/13/2014   PCI using DES in SVG to LCx system  . Dental caries    pre-heart valve surgery protocol  . Gastroesophageal reflux disease   .  History of erectile dysfunction   . History of hiatal hernia   . History of kidney stones   . History of obesity   . History of prostatitis   . Hypercholesterolemia   . Hypertension   . Kidney stones   . LBBB (left bundle branch block)   . Nodule of kidney 02/04/2016   Incidental 80mm nodule upper pole right kidney noted on CT angiogram - MRI with and without gadolinium contrast recommened in 6 months  . OSA (obstructive sleep apnea)    "suppose to wear mask; I don't" (03/13/2014)  . PVC's (premature ventricular contractions)   . S/P CABG x 7 01/07/1995   LIMA to LAD-diagonal, SVG to OM-LCx, SVG to AM-PD-RPL  . S/P TAVR (transcatheter aortic valve replacement) 02/18/2016   26 mm Edwards Sapien 3 transcatheter heart valve placed via percutaneous right transfemoral approach    Past Surgical History:  Procedure Laterality Date  . [place]; ~ 2006   ; Ejection fraction is estimated at 45%  . CARDIAC CATHETERIZATION N/A 01/22/2016   Procedure: Right/Left Heart Cath and Coronary/Graft Angiography;  Surgeon: [name] [name], MD;  Location: [facility];  Service: Cardiovascular;  Laterality: N/A;  . CHOLECYSTECTOMY N/A 01/23/2014   Procedure: LAPAROSCOPIC CHOLECYSTECTOMY WITH INTRAOPERATIVE CHOLANGIOGRAM ;  Surgeon: Fanny Skates, MD;  Location: [facility];  Service: General;  Laterality: N/A;  . COLONOSCOPY W/ POLYPECTOMY    . CORONARY ANGIOPLASTY WITH STENT PLACEMENT  03/13/2014   "1"  . CORONARY ARTERY BYPASS GRAFT  1996   "CABG X  7"  . CYSTOSCOPY WITH STENT PLACEMENT    . EP IMPLANTABLE DEVICE N/A 12/17/2014   Procedure: BiV ICD Insertion CRT-D;  Surgeon: Evans Lance, MD;  Location: [facility];  Service: Cardiovascular;  Laterality: N/A;  . LEFT HEART CATHETERIZATION WITH CORONARY/GRAFT ANGIOGRAM N/A 03/13/2014   Procedure: LEFT HEART CATHETERIZATION WITH Beatrix Fetters;  Surgeon: [name] [name], MD;  Location: [facility];  Service: Cardiovascular;   Laterality: N/A;  . MULTIPLE EXTRACTIONS WITH ALVEOLOPLASTY N/A 02/07/2016   Procedure: Extraction of tooth #'s 1,2,3,14,15, 18, 20, 29,30,31 with alveoloplasty and dental cleaning of teeth.;  Surgeon: Lenn Cal, DDS;  Location: [facility];  Service: Oral Surgery;  Laterality: N/A;  . TEE WITHOUT CARDIOVERSION N/A 02/18/2016   Procedure: TRANSESOPHAGEAL ECHOCARDIOGRAM (TEE);  Surgeon: Sherren Mocha, MD;  Location: [facility];  Service: Open Heart Surgery;  Laterality: N/A;  . TONSILLECTOMY  ~ 1950  . TRANSCATHETER AORTIC VALVE REPLACEMENT, TRANSFEMORAL N/A 02/18/2016   Procedure: TRANSCATHETER AORTIC VALVE REPLACEMENT, TRANSFEMORAL;  Surgeon: Sherren Mocha, MD;  Location: [facility];  Service: Open Heart Surgery;  Laterality: N/A;    Current Outpatient Prescriptions  Medication Sig Dispense Refill  . Ascorbic Acid (VITAMIN C) 1000 MG tablet Take 1,000 mg by mouth daily.    Marland Kitchen aspirin EC 81 MG EC tablet Take 1 tablet (81 mg total) by mouth daily.    Marland Kitchen atorvastatin (LIPITOR) 80 MG tablet Take 80 mg by mouth daily.    . Cholecalciferol (VITAMIN D-3) 5000 units TABS Take 1 tablet by mouth daily.    . clopidogrel (PLAVIX) 75 MG tablet Take 1 tablet (75 mg total) by mouth daily. 30 tablet 11  . finasteride (PROSCAR) 5 MG tablet Take 5 mg by mouth daily.    . isosorbide mononitrate (IMDUR) 60 MG 24 hr tablet Take 1 tablet (60 mg total) by mouth daily. 90 tablet 3  . metoprolol succinate (TOPROL-XL) 50 MG 24 hr tablet Take 1 tablet (50 mg total) by mouth daily. Take with or immediately following a meal. 90 tablet 3  . nitroGLYCERIN (NITROSTAT) 0.4 MG SL tablet Place 1 tablet (0.4 mg total) under the tongue every 5 (five) minutes as needed for chest pain. 100 tablet 3  . oxybutynin (DITROPAN) 5 MG tablet Take 5 mg by mouth daily.     . pantoprazole (PROTONIX) 40 MG tablet Take 1 tablet (40 mg total) by mouth daily. 90 tablet 1  . ramipril (ALTACE) 5 MG capsule Take 1 capsule (5 mg total) by mouth daily. 90  capsule 3  . valACYclovir (VALTREX) 500 MG tablet Take 500 mg by mouth daily as needed (outbreak).      No current facility-administered medications for this visit.     Allergies:   No known allergies   Social History:  The patient  reports that he quit smoking about 19 years ago. His smoking use included Cigarettes. He has a 11.25 pack-year smoking history. He has never used smokeless tobacco. He reports that he does not drink alcohol or use drugs.   Family History:  The patient's  family history includes Cancer in his sister; Heart attack in his father.    ROS:  Please see the history of present illness.  Otherwise, review of systems is positive for excessive nighttime sweating.  All other systems are reviewed and negative.    PHYSICAL EXAM: VS:  BP 110/70   Pulse 64   Ht 5\' 10"  (1.778 m)   Wt 216 lb 1.9 oz (  98 kg)   BMI 31.01 kg/m  , BMI Body mass index is 31.01 kg/m. GEN: Well nourished, well developed, in no acute distress  HEENT: normal  Neck: no JVD, no masses.  Cardiac: RRR with grade 2/6 systolic murmur at the RUSB, no diastolic murmur            Respiratory:  clear to auscultation bilaterally, normal work of breathing GI: soft, nontender, nondistended, + BS MS: no deformity or atrophy  Ext: no pretibial edema, pedal pulses 2+= bilaterally Skin: warm and dry, no rash Neuro:  Strength and sensation are intact Psych: euthymic mood, full affect  EKG:  EKG is not ordered today.  Recent Labs: 02/14/2016: ALT 37 02/19/2016: Hemoglobin 11.9; Magnesium 1.5; Platelets 130 02/20/2016: BUN 13; Creatinine, Ser 0.77; Potassium 3.5; Sodium 138   Lipid Panel     Component Value Date/Time   CHOL 150 03/12/2015 0932   TRIG 105 03/12/2015 0932   HDL 38 (L) 03/12/2015 0932   CHOLHDL 3.9 03/12/2015 0932   VLDL 21 03/12/2015 0932   LDLCALC 91 03/12/2015 0932      Wt Readings from Last 3 Encounters:  03/13/16 216 lb 1.9 oz (98 kg)  02/20/16 216 lb 7.9 oz (98.2 kg)  02/14/16 210  lb (95.3 kg)     Cardiac Studies Reviewed: 2D Echo: formal interpretation pending  ASSESSMENT AND PLAN: Severe low gradient aortic stenosis, now one month status post TAVR. Clinically the patient is doing well. He reports generalized fatigue and his blood pressure is in the low-normal range today. Ramipril was increased during his hospital stay and I will reduce this back to his baseline dose of 5 mg daily. He should continue on dual antiplatelet therapy with aspirin and clopidogrel. He has New York Heart Association functional class II symptoms.  I have personally reviewed his echo images. The peak and mean transvalvular gradients are 28 and 16 mmHg, respectively. There is mild paravalvular regurgitation. The patient's transvalvular gradients are modestly elevated especially in the setting of his LV dysfunction. Will await formal echo interpretation for accurate assessment of LVEF. With modestly elevated gradients, will check a gated cardiac CTA to evaluate for leaflet mobility and make sure there is no evidence of subacute leaflet thrombosis. If there is evidence of this it would be reasonable to consider switching from plavix to an oral anticoagulant drug. He will have a follow-up echocardiogram in one year. I reviewed my interpretation of echo findings with the patient and his wife today. I would like him to see Dr. [name] in follow-up in about 4 months. SBE prophylaxis guidelines reviewed with the patient. We discussed his physical activity and he will begin to increase his activity. Advised regular walking exercise with the patient.  Current medicines are reviewed with the patient today.  The patient does not have concerns regarding medicines.  Labs/ tests ordered today include:   Orders Placed This Encounter  Procedures  . ECHOCARDIOGRAM COMPLETE   Disposition:   FU one year with an echo  Signed, Sherren Mocha, MD  03/14/2016 7:03 PM    [facility] [address], [place], [place]  [address] Phone: [phone]; Fax: [phone]

## 2016-03-18 ENCOUNTER — Telehealth: Payer: Self-pay

## 2016-03-18 DIAGNOSIS — I350 Nonrheumatic aortic (valve) stenosis: Secondary | ICD-10-CM

## 2016-03-18 DIAGNOSIS — Z952 Presence of prosthetic heart valve: Secondary | ICD-10-CM

## 2016-03-18 NOTE — Telephone Encounter (Signed)
From: Sherren Mocha, MD  Sent: 03/14/2016  7:09 PM  To: Barkley Boards, RN   Lauren - can you set him up for a gated cardiac CTA with Nish? Dx: aortic stenosis. I am ordering to make sure he doesn't have subacute leaflet thrombosis which has been reported in TAVR patients with elevated gradients.   thx    I left a message for the pt to contact us in regards to findings of elevated gradients on echocardiogram.

## 2016-03-18 NOTE — Telephone Encounter (Signed)
I spoke with the [name] and made him aware of Dr Antionette Char recommendation. Will place order for gated cardiac CTA with Nishan to read.

## 2016-03-20 NOTE — Telephone Encounter (Signed)
Pt scheduled for Gated Cardiac CTA on Wednesday, 03/25/16 at 11:30. Arrive in admitting at [facility] at 11:15 and they will direct you to Radiology.  No solid foods, caffeine or smoking 4 hours prior to CTA No herbal supplements by mouth 24 hours prior to test No sexual enhancement drugs 72 hours prior to test   I spoke with the pt's wife and made her aware of appointment and pre-test instructions.

## 2016-03-24 ENCOUNTER — Ambulatory Visit (INDEPENDENT_AMBULATORY_CARE_PROVIDER_SITE_OTHER): Payer: Medicare Other | Admitting: *Deleted

## 2016-03-24 DIAGNOSIS — I5022 Chronic systolic (congestive) heart failure: Secondary | ICD-10-CM | POA: Diagnosis not present

## 2016-03-24 NOTE — Progress Notes (Signed)
Remote ICD transmission.   

## 2016-03-25 ENCOUNTER — Encounter (HOSPITAL_COMMUNITY): Payer: Self-pay

## 2016-03-25 ENCOUNTER — Ambulatory Visit (HOSPITAL_COMMUNITY)
Admission: RE | Admit: 2016-03-25 | Discharge: 2016-03-25 | Disposition: A | Discharge status: Home / Self Care | Payer: Medicare Other | Source: Ambulatory Visit | Attending: Cardiovascular Disease | Admitting: Cardiovascular Disease

## 2016-03-25 ENCOUNTER — Encounter: Payer: Self-pay | Admitting: Cardiology

## 2016-03-25 DIAGNOSIS — I700 Atherosclerosis of aorta: Secondary | ICD-10-CM | POA: Diagnosis not present

## 2016-03-25 DIAGNOSIS — Z952 Presence of prosthetic heart valve: Secondary | ICD-10-CM

## 2016-03-25 DIAGNOSIS — R918 Other nonspecific abnormal finding of lung field: Secondary | ICD-10-CM | POA: Diagnosis not present

## 2016-03-25 DIAGNOSIS — I350 Nonrheumatic aortic (valve) stenosis: Secondary | ICD-10-CM

## 2016-03-25 LAB — CUP PACEART REMOTE DEVICE CHECK
Battery Voltage: 2.98 V
Brady Statistic AP VS Percent: 0.39 %
Brady Statistic RV Percent Paced: 88.27 %
HIGH POWER IMPEDANCE MEASURED VALUE: 65 Ohm
Implantable Lead Implant Date: 20161205
Implantable Lead Implant Date: 20161205
Implantable Lead Location: 753858
Implantable Lead Location: 753859
Implantable Lead Location: 753860
Implantable Lead Model: 5076
Implantable Lead Model: 6935
Implantable Pulse Generator Implant Date: 20161205
Lead Channel Impedance Value: 342 Ohm
Lead Channel Impedance Value: 361 Ohm
Lead Channel Impedance Value: 361 Ohm
Lead Channel Impedance Value: 399 Ohm
Lead Channel Impedance Value: 418 Ohm
Lead Channel Impedance Value: 418 Ohm
Lead Channel Impedance Value: 418 Ohm
Lead Channel Impedance Value: 608 Ohm
Lead Channel Impedance Value: 646 Ohm
Lead Channel Impedance Value: 665 Ohm
Lead Channel Pacing Threshold Amplitude: 0.625 V
Lead Channel Pacing Threshold Amplitude: 0.625 V
Lead Channel Pacing Threshold Amplitude: 0.875 V
Lead Channel Pacing Threshold Pulse Width: 0.4 ms
Lead Channel Pacing Threshold Pulse Width: 0.4 ms
Lead Channel Sensing Intrinsic Amplitude: 1.5 mV
Lead Channel Sensing Intrinsic Amplitude: 1.5 mV
Lead Channel Sensing Intrinsic Amplitude: 16.375 mV
Lead Channel Setting Pacing Amplitude: 2 V
Lead Channel Setting Pacing Amplitude: 2.5 V
MDC IDC LEAD IMPLANT DT: 20161205
MDC IDC MSMT BATTERY REMAINING LONGEVITY: 79 mo
MDC IDC MSMT LEADCHNL LV IMPEDANCE VALUE: 342 Ohm
MDC IDC MSMT LEADCHNL LV IMPEDANCE VALUE: 589 Ohm
MDC IDC MSMT LEADCHNL LV IMPEDANCE VALUE: 646 Ohm
MDC IDC MSMT LEADCHNL RA PACING THRESHOLD PULSEWIDTH: 0.4 ms
MDC IDC MSMT LEADCHNL RV SENSING INTR AMPL: 16.375 mV
MDC IDC SESS DTM: 20180313052303
MDC IDC SET LEADCHNL LV PACING PULSEWIDTH: 0.4 ms
MDC IDC SET LEADCHNL RA PACING AMPLITUDE: 2 V
MDC IDC SET LEADCHNL RV PACING PULSEWIDTH: 0.4 ms
MDC IDC SET LEADCHNL RV SENSING SENSITIVITY: 0.3 mV
MDC IDC STAT BRADY AP VP PERCENT: 33.05 %
MDC IDC STAT BRADY AS VP PERCENT: 62.62 %
MDC IDC STAT BRADY AS VS PERCENT: 3.94 %
MDC IDC STAT BRADY RA PERCENT PACED: 30.63 %

## 2016-03-25 MED ORDER — METOPROLOL TARTRATE 5 MG/5ML IV SOLN
2.5000 mg | Freq: Once | INTRAVENOUS | Status: AC
  Administered 2016-03-25: 2.5 mg via INTRAVENOUS
  Filled 2016-03-25: qty 5

## 2016-03-25 MED ORDER — IOPAMIDOL (ISOVUE-370) INJECTION 76%
INTRAVENOUS | Status: AC
  Administered 2016-03-25: 80 mL
  Filled 2016-03-25: qty 100

## 2016-03-25 MED ORDER — METOPROLOL TARTRATE 5 MG/5ML IV SOLN
INTRAVENOUS | Status: AC
  Filled 2016-03-25: qty 5

## 2016-03-25 MED ORDER — IOPAMIDOL (ISOVUE-370) INJECTION 76%
INTRAVENOUS | Status: AC
  Filled 2016-03-25: qty 100

## 2016-03-25 NOTE — Progress Notes (Signed)
Post CT heart VS

## 2016-03-26 ENCOUNTER — Other Ambulatory Visit: Payer: Self-pay | Admitting: Cardiology

## 2016-03-26 DIAGNOSIS — I5022 Chronic systolic (congestive) heart failure: Secondary | ICD-10-CM

## 2016-03-31 ENCOUNTER — Other Ambulatory Visit: Payer: Self-pay

## 2016-03-31 ENCOUNTER — Telehealth: Payer: Self-pay | Admitting: Pharmacist Clinician (PhC)/ Clinical Pharmacy Specialist

## 2016-03-31 DIAGNOSIS — Z952 Presence of prosthetic heart valve: Secondary | ICD-10-CM

## 2016-03-31 DIAGNOSIS — I350 Nonrheumatic aortic (valve) stenosis: Secondary | ICD-10-CM

## 2016-03-31 MED ORDER — WARFARIN SODIUM 5 MG PO TABS: 5.0000 mg | ORAL_TABLET | Freq: Every day | ORAL | 1 refills | Status: DC

## 2016-03-31 NOTE — Telephone Encounter (Signed)
-----   Message from Sherren Mocha, MD sent at 03/30/2016  5:39 PM EDT ----- Discussed results with patient. Recommended that he start warfarin and stop plavix once warfarin initiated. Check 6 month echo. Advised will arrange initial anticoagulation visit at [facility] office. All questions answered.

## 2016-03-31 NOTE — Telephone Encounter (Signed)
Spoke with patient, will start warfarin today, 5 mg daily, New coumadin appt set for Friday.

## 2016-04-03 ENCOUNTER — Ambulatory Visit (INDEPENDENT_AMBULATORY_CARE_PROVIDER_SITE_OTHER): Payer: Medicare Other | Admitting: Pharmacist

## 2016-04-03 DIAGNOSIS — Z952 Presence of prosthetic heart valve: Secondary | ICD-10-CM | POA: Diagnosis not present

## 2016-04-03 LAB — POCT INR: INR: 1.1

## 2016-04-03 NOTE — Patient Instructions (Signed)
Vitamin K Foods and Warfarin Warfarin is a blood thinner (anticoagulant). Anticoagulant medicines help prevent the formation of blood clots. These medicines work by decreasing the activity of vitamin K, which promotes normal blood clotting. When you take warfarin, problems can occur from suddenly increasing or decreasing the amount of vitamin K that you eat from one day to the next. Problems may include:  Blood clots.  Bleeding. What general guidelines do I need to follow? To avoid problems when taking warfarin:  Eat a balanced diet that includes:  Fresh fruits and vegetables.  Whole grains.  Low-fat dairy products.  Lean proteins, such as fish, eggs, and lean cuts of meat.  Keep your intake of vitamin K consistent from day to day. To do this:  Avoid eating large amounts of vitamin K one day and low amounts of vitamin K the next day.  If you take a multivitamin that contains vitamin K, be sure to take it every day.  Know which foods contain vitamin K. Use the lists below to understand serving sizes and the amount of vitamin K in one serving.  Avoid major changes in your diet. If you are going to change your diet, talk with your health care provider before making changes.  Work with a nutrition specialist (dietitian) to develop a meal plan that works best for you. High vitamin K foods Foods that are high in vitamin K contain more than 100 mcg (micrograms) per serving. These include:  Broccoli (cooked) -  cup has 110 mcg.  Brussels sprouts (cooked) -  cup has 109 mcg.  Greens, beet (cooked) -  cup has 350 mcg.  Greens, collard (cooked) -  cup has 418 mcg.  Greens, turnip (cooked) -  cup has 265 mcg.  Green onions or scallions -  cup has 105 mcg.  Kale (fresh or frozen) -  cup has 531 mcg.  Parsley (raw) - 10 sprigs has 164 mcg.  Spinach (cooked) -  cup has 444 mcg.  Swiss chard (cooked) -  cup has 287 mcg. Moderate vitamin K foods Foods that have a  moderate amount of vitamin K contain 25-100 mcg per serving. These include:  Asparagus (cooked) - 5 spears have 38 mcg.  Black-eyed peas (dried) -  cup has 32 mcg.  Cabbage (cooked) -  cup has 37 mcg.  Kiwi fruit - 1 medium has 31 mcg.  Lettuce - 1 cup has 57-63 mcg.  Okra (frozen) -  cup has 44 mcg.  Prunes (dried) - 5 prunes have 25 mcg.  Watercress (raw) - 1 cup has 85 mcg. Low vitamin K foods Foods low in vitamin K contain less than 25 mcg per serving. These include:  Artichoke - 1 medium has 18 mcg.  Avocado - 1 oz. has 6 mcg.  Blueberries -  cup has 14 mcg.  Cabbage (raw) -  cup has 21 mcg.  Carrots (cooked) -  cup has 11 mcg.  Cauliflower (raw) -  cup has 11 mcg.  Cucumber with peel (raw) -  cup has 9 mcg.  Grapes -  cup has 12 mcg.  Mango - 1 medium has 9 mcg.  Nuts - 1 oz. has 15 mcg.  Pear - 1 medium has 8 mcg.  Peas (cooked) -  cup has 19 mcg.  Pickles - 1 spear has 14 mcg.  Pumpkin seeds - 1 oz. has 13 mcg.  Sauerkraut (canned) -  cup has 16 mcg.  Soybeans (cooked) -  cup has 16 mcg.    Tomato (raw) - 1 medium has 10 mcg.  Tomato sauce -  cup has 17 mcg. Vitamin K-free foods If a food contain less than 5 mcg per serving, it is considered to have no vitamin K. These foods include:  Bread and cereal products.  Cheese.  Eggs.  Fish and shellfish.  Meat and poultry.  Milk and dairy products.  Sunflower seeds. Actual amounts of vitamin K in foods may be different depending on processing. Talk with your dietitian about what foods you can eat and what foods you should avoid. This information is not intended to replace advice given to you by your health care provider. Make sure you discuss any questions you have with your health care provider. Document Released: 10/26/2008 Document Revised: 07/21/2015 Document Reviewed: 04/03/2015 Elsevier Interactive Patient Education  2017 Elsevier Inc.  

## 2016-04-07 ENCOUNTER — Ambulatory Visit (INDEPENDENT_AMBULATORY_CARE_PROVIDER_SITE_OTHER): Payer: Medicare Other | Admitting: Pharmacist

## 2016-04-07 DIAGNOSIS — Z952 Presence of prosthetic heart valve: Secondary | ICD-10-CM

## 2016-04-07 LAB — POCT INR: INR: 1.8

## 2016-04-07 MED ORDER — WARFARIN SODIUM 5 MG PO TABS: ORAL_TABLET | ORAL | 1 refills | Status: DC

## 2016-04-13 ENCOUNTER — Ambulatory Visit (INDEPENDENT_AMBULATORY_CARE_PROVIDER_SITE_OTHER): Payer: Medicare Other | Admitting: Pharmacist Clinician (PhC)/ Clinical Pharmacy Specialist

## 2016-04-13 DIAGNOSIS — Z952 Presence of prosthetic heart valve: Secondary | ICD-10-CM

## 2016-04-13 LAB — POCT INR: INR: 2.6

## 2016-04-20 ENCOUNTER — Ambulatory Visit (INDEPENDENT_AMBULATORY_CARE_PROVIDER_SITE_OTHER): Payer: Medicare Other | Admitting: Pharmacist

## 2016-04-20 DIAGNOSIS — Z952 Presence of prosthetic heart valve: Secondary | ICD-10-CM

## 2016-04-20 LAB — POCT INR: INR: 3.2

## 2016-05-01 ENCOUNTER — Ambulatory Visit (INDEPENDENT_AMBULATORY_CARE_PROVIDER_SITE_OTHER): Payer: Medicare Other | Admitting: Pharmacist Clinician (PhC)/ Clinical Pharmacy Specialist

## 2016-05-01 ENCOUNTER — Encounter: Payer: Self-pay | Admitting: Cardiovascular Disease

## 2016-05-01 DIAGNOSIS — Z952 Presence of prosthetic heart valve: Secondary | ICD-10-CM | POA: Diagnosis not present

## 2016-05-01 LAB — POCT INR: INR: 3.2

## 2016-05-08 ENCOUNTER — Other Ambulatory Visit: Payer: Self-pay | Admitting: Cardiology

## 2016-05-11 ENCOUNTER — Telehealth: Payer: Self-pay | Admitting: Cardiovascular Disease

## 2016-05-11 NOTE — Telephone Encounter (Signed)
I spoke with the pt's wife and made her aware that the pt needs to see an opthalmologist but Dr Burt Knack did not have a specific recommendation.

## 2016-05-11 NOTE — Telephone Encounter (Signed)
REFILL 

## 2016-05-11 NOTE — Telephone Encounter (Signed)
I spoke with the pt and he has had 2 episodes in the past week of not being able to see out of his right eye.  Both episodes occurred when the pt bent over and stood up. His vision in right eye was cloudy and when he closed his left eye everything was gray in color, not black. As the pt's vision returned he could see outlines and then things just kept getting brighter and brighter.  The pt has had eye exams performed by Dr Phineas Douglas at [facility] in the past. 2016 carotid duplex in Epic showed 1-39% internal carotid artery stenosis bilaterally. The pt would like to know if Dr Burt Knack can recommend an eye doctor.

## 2016-05-11 NOTE — Telephone Encounter (Signed)
New Message:    Pt's would like for you to recommend him to a good eye doctor please.

## 2016-05-11 NOTE — Telephone Encounter (Signed)
Definitely should see an ophthalmologist. I don't have anyone specific to recommend.

## 2016-05-15 ENCOUNTER — Ambulatory Visit (INDEPENDENT_AMBULATORY_CARE_PROVIDER_SITE_OTHER): Payer: Medicare Other | Admitting: Pharmacist Clinician (PhC)/ Clinical Pharmacy Specialist

## 2016-05-15 DIAGNOSIS — Z952 Presence of prosthetic heart valve: Secondary | ICD-10-CM

## 2016-05-15 LAB — POCT INR: INR: 1.6

## 2016-05-29 ENCOUNTER — Ambulatory Visit (INDEPENDENT_AMBULATORY_CARE_PROVIDER_SITE_OTHER): Payer: Medicare Other | Admitting: Pharmacist Clinician (PhC)/ Clinical Pharmacy Specialist

## 2016-05-29 DIAGNOSIS — Z952 Presence of prosthetic heart valve: Secondary | ICD-10-CM

## 2016-05-29 LAB — POCT INR: INR: 2.7

## 2016-06-03 ENCOUNTER — Other Ambulatory Visit: Payer: Self-pay | Admitting: Cardiovascular Disease

## 2016-06-13 NOTE — Addendum Note (Signed)
Addendum  created 06/13/16 [id number] by Duane Boston, MD   Sign clinical note

## 2016-06-23 ENCOUNTER — Ambulatory Visit (INDEPENDENT_AMBULATORY_CARE_PROVIDER_SITE_OTHER): Payer: Medicare Other | Admitting: Pharmacist Clinician (PhC)/ Clinical Pharmacy Specialist

## 2016-06-23 DIAGNOSIS — Z952 Presence of prosthetic heart valve: Secondary | ICD-10-CM

## 2016-06-23 LAB — POCT INR: INR: 2.1

## 2016-06-27 ENCOUNTER — Other Ambulatory Visit: Payer: Self-pay | Admitting: Cardiology

## 2016-06-29 NOTE — Telephone Encounter (Signed)
Rx(s) sent to pharmacy electronically.  

## 2016-07-21 ENCOUNTER — Ambulatory Visit (INDEPENDENT_AMBULATORY_CARE_PROVIDER_SITE_OTHER): Payer: Medicare Other | Admitting: Pharmacist

## 2016-07-21 DIAGNOSIS — Z952 Presence of prosthetic heart valve: Secondary | ICD-10-CM

## 2016-07-21 LAB — POCT INR: INR: 1.8

## 2016-07-26 ENCOUNTER — Other Ambulatory Visit: Payer: Self-pay | Admitting: Cardiology

## 2016-07-27 NOTE — Telephone Encounter (Signed)
REFILL 

## 2016-08-04 ENCOUNTER — Other Ambulatory Visit: Payer: Self-pay | Admitting: Cardiovascular Disease

## 2016-08-06 ENCOUNTER — Other Ambulatory Visit: Payer: Self-pay | Admitting: Cardiology

## 2016-08-19 ENCOUNTER — Ambulatory Visit (INDEPENDENT_AMBULATORY_CARE_PROVIDER_SITE_OTHER): Payer: Medicare Other | Admitting: Pharmacist

## 2016-08-19 DIAGNOSIS — Z952 Presence of prosthetic heart valve: Secondary | ICD-10-CM | POA: Diagnosis not present

## 2016-08-19 LAB — POCT INR: INR: 1.8

## 2016-09-01 ENCOUNTER — Other Ambulatory Visit: Payer: Self-pay | Admitting: Cardiology

## 2016-09-16 ENCOUNTER — Ambulatory Visit (INDEPENDENT_AMBULATORY_CARE_PROVIDER_SITE_OTHER): Payer: Medicare Other | Admitting: Pharmacist

## 2016-09-16 DIAGNOSIS — Z952 Presence of prosthetic heart valve: Secondary | ICD-10-CM

## 2016-09-16 LAB — POCT INR: INR: 2.4

## 2016-09-16 MED ORDER — WARFARIN SODIUM 5 MG PO TABS: ORAL_TABLET | ORAL | 0 refills | Status: DC

## 2016-09-20 ENCOUNTER — Other Ambulatory Visit: Payer: Self-pay | Admitting: Cardiology

## 2016-09-21 ENCOUNTER — Other Ambulatory Visit: Payer: Self-pay

## 2016-09-28 ENCOUNTER — Ambulatory Visit (HOSPITAL_COMMUNITY): Payer: Medicare Other | Attending: Cardiology

## 2016-09-30 ENCOUNTER — Other Ambulatory Visit: Payer: Self-pay | Admitting: Cardiology

## 2016-10-09 NOTE — Progress Notes (Signed)
[name] Date of Birth: 04-Oct-1945 Medical Record #[id number]  History of Present Illness: [name] is seen for follow up CAD and aortic stenosis.  He has a known history of coronary disease and is status post CABG in 1996. This included an LIMA graft to the LAD and diagonal, sequential vein graft to the acute marginal, PDA, and posterior lateral branches of the right coronary, sequential saphenous vein graft to the obtuse marginal vessel and distal circumflex. He had a cardiac catheterization in 2007 which showed that his grafts were all patent. On 04/02/14 he had repeat cath for chest pain and grafts were again patent but there was a 90% stenosis in the SVG to OM2. This was stented with a DES.  Despite this intervention he has continued to have chest pain. He had cholecystectomy in January 8182 without complications and without relief of his pain. CXR was unremarkable. CT of the chest was negative for PE or aortic pathology. He had EGD by Dr. Oletta Lamas that showed Barrett's esophagus but no evidence of esophagitis. He is on Protonix.   In Dec 2016  he was admitted for evaluation of syncope. Echo showed EF 30-35% with moderate to severe AS. Mean gradient 24 mm Hg. AVA 0.8-0.9. There was concern that AVA was underestimated due to low output. EEG was negative. He had a LBBB. He underwent placement of a BIV-ICD by Dr. Lovena Le.  He then  had a repeat Echo showing some increase in AV gradient. He underwent a Dobutamine Echo. This showed improvement in EF to 40-45%. There was also increase in AV gradient with a mean gradient of 41 mm Hg and severe AS. He underwent uncomplicated TAVR 99/37/1696 via a percutaneous right transfemoral approach. He was treated with a 26 mm Edwards Sapien 3 transcatheter heart valve. Repeat Echo in March showed improvement in EF to 40-45%.   On follow up today he is feeling well. No dizziness or syncope. No palpitations. No dyspnea. His chronic chest pain is improved. He still has  indigestion and does get chest pain once or twice a week. Rarely uses sl Ntg now.   He is active walking daily and doing house/yard work.   Current Outpatient Prescriptions on File Prior to Visit  Medication Sig Dispense Refill  . Ascorbic Acid (VITAMIN C) 1000 MG tablet Take 1,000 mg by mouth daily.    Marland Kitchen aspirin EC 81 MG EC tablet Take 1 tablet (81 mg total) by mouth daily.    . Cholecalciferol (VITAMIN D-3) 5000 units TABS Take 1 tablet by mouth daily.    . finasteride (PROSCAR) 5 MG tablet Take 5 mg by mouth daily.    . isosorbide mononitrate (IMDUR) 60 MG 24 hr tablet TAKE ONE TABLET BY MOUTH DAILY 90 tablet 3  . nitroGLYCERIN (NITROSTAT) 0.4 MG SL tablet DISSOLVE 1 TABLET UNDER TONGUE FOR CHEST PAIN -MAY REPEAT IF PAIN REMAINS AFTER 5 MIN, CALL 911 AND REPEAT DOSE. MAX 3 TABLETS IN 15 MINUTES 100 tablet 0  . oxybutynin (DITROPAN) 5 MG tablet Take 5 mg by mouth daily.     . pantoprazole (PROTONIX) 40 MG tablet Take 1 tablet (40 mg total) by mouth daily. 90 tablet 2  . valACYclovir (VALTREX) 500 MG tablet Take 500 mg by mouth daily as needed (outbreak).      No current facility-administered medications on file prior to visit.     Allergies  Allergen Reactions  . No Known Allergies     Past Medical History:  Diagnosis Date  .  Aortic stenosis   . Barrett's esophageal ulceration   . Chronic systolic CHF (congestive heart failure) ([place])   . Coronary artery disease    a. CABG 1996: LIMA graft to the LAD and diagonal, sequential vein graft to the acute marginal, PDA, and posterior lateral branches of the right coronary, sequential saphenous vein graft to the obtuse marginal vessel and distal circumflex. b. Cath 2007 patent grafts. c. s/p DES to SVG-OM2 in 03/2014.  Marland Kitchen Coronary artery disease involving autologous vein coronary bypass graft with angina pectoris ([place]) 03/13/2014   PCI using DES in SVG to LCx system  . Dental caries    pre-heart valve surgery protocol  . Gastroesophageal  reflux disease   . History of erectile dysfunction   . History of hiatal hernia   . History of kidney stones   . History of obesity   . History of prostatitis   . Hypercholesterolemia   . Hypertension   . Kidney stones   . LBBB (left bundle branch block)   . Nodule of kidney 02/04/2016   Incidental 33mm nodule upper pole right kidney noted on CT angiogram - MRI with and without gadolinium contrast recommened in 6 months  . OSA (obstructive sleep apnea)    "suppose to wear mask; I don't" (03/13/2014)  . PVC's (premature ventricular contractions)   . S/P CABG x 7 01/07/1995   LIMA to LAD-diagonal, SVG to OM-LCx, SVG to AM-PD-RPL  . S/P TAVR (transcatheter aortic valve replacement) 02/18/2016   26 mm Edwards Sapien 3 transcatheter heart valve placed via percutaneous right transfemoral approach    Past Surgical History:  Procedure Laterality Date  . [place]; ~ 2006   ; Ejection fraction is estimated at 45%  . CARDIAC CATHETERIZATION N/A 01/22/2016   Procedure: Right/Left Heart Cath and Coronary/Graft Angiography;  Surgeon: [name] [name], MD;  Location: [place] [facility];  Service: Cardiovascular;  Laterality: N/A;  . CHOLECYSTECTOMY N/A 01/23/2014   Procedure: LAPAROSCOPIC CHOLECYSTECTOMY WITH INTRAOPERATIVE CHOLANGIOGRAM ;  Surgeon: Fanny Skates, MD;  Location: [place];  Service: General;  Laterality: N/A;  . COLONOSCOPY W/ POLYPECTOMY    . CORONARY ANGIOPLASTY WITH STENT PLACEMENT  03/13/2014   "1"  . CORONARY ARTERY BYPASS GRAFT  1996   "CABG X 7"  . CYSTOSCOPY WITH STENT PLACEMENT    . EP IMPLANTABLE DEVICE N/A 12/17/2014   Procedure: BiV ICD Insertion CRT-D;  Surgeon: Evans Lance, MD;  Location: [place] [facility];  Service: Cardiovascular;  Laterality: N/A;  . LEFT HEART CATHETERIZATION WITH CORONARY/GRAFT ANGIOGRAM N/A 03/13/2014   Procedure: LEFT HEART CATHETERIZATION WITH Beatrix Fetters;  Surgeon: [name] [name], MD;  Location: [facility];  Service:  Cardiovascular;  Laterality: N/A;  . MULTIPLE EXTRACTIONS WITH ALVEOLOPLASTY N/A 02/07/2016   Procedure: Extraction of tooth #'s 1,2,3,14,15, 18, 20, 29,30,31 with alveoloplasty and dental cleaning of teeth.;  Surgeon: Lenn Cal, DDS;  Location: [place];  Service: Oral Surgery;  Laterality: N/A;  . TEE WITHOUT CARDIOVERSION N/A 02/18/2016   Procedure: TRANSESOPHAGEAL ECHOCARDIOGRAM (TEE);  Surgeon: Sherren Mocha, MD;  Location: [place];  Service: Open Heart Surgery;  Laterality: N/A;  . TONSILLECTOMY  ~ 1950  . TRANSCATHETER AORTIC VALVE REPLACEMENT, TRANSFEMORAL N/A 02/18/2016   Procedure: TRANSCATHETER AORTIC VALVE REPLACEMENT, TRANSFEMORAL;  Surgeon: Sherren Mocha, MD;  Location: [place];  Service: Open Heart Surgery;  Laterality: N/A;    History  Smoking Status  . Former Smoker  . Packs/day: 0.75  . Years:  15.00  . Types: Cigarettes  . Quit date: 01/12/1997  Smokeless Tobacco  . Never Used    Comment: 3/4 pack per day. smoked 15 years off and on    History  Alcohol Use No    Family History  Problem Relation Age of Onset  . Heart attack Father   . Cancer Sister        Uterine Cancer    Review of Systems: As noted in HPI.  All other systems were reviewed and are negative.  Physical Exam: BP (!) 96/50   Pulse 67   Ht 5\' 10"  (1.778 m)   Wt 215 lb 6.4 oz (97.7 kg)   BMI 30.91 kg/m  GENERAL:  Well appearing WM in NAD HEENT:  PERRL, EOMI, sclera are clear. Oropharynx is clear. NECK:  No jugular venous distention, carotid upstroke brisk and symmetric, no bruits, no thyromegaly or adenopathy LUNGS:  Clear to auscultation bilaterally CHEST:  Unremarkable HEART:  RRR,  PMI not displaced or sustained,S1 and S2 within normal limits, no S3, no S4: no clicks, no rubs, gr 1/6 systolic murmur RUSB. ABD:  Soft, nontender. BS +, no masses or bruits. No hepatomegaly, no splenomegaly EXT:  2 + pulses throughout, no edema, no cyanosis no clubbing SKIN:  Warm and dry.  No rashes NEURO:   Alert and oriented x 3. Cranial nerves II through XII intact. PSYCH:  Cognitively intact    LABORATORY DATA:   Lab Results  Component Value Date   WBC 10.7 (H) 02/19/2016   HGB 11.9 (L) 02/19/2016   HCT 35.5 (L) 02/19/2016   PLT 130 (L) 02/19/2016   GLUCOSE 110 (H) 02/20/2016   CHOL 150 03/12/2015   TRIG 105 03/12/2015   HDL 38 (L) 03/12/2015   LDLCALC 91 03/12/2015   ALT 37 02/14/2016   AST 39 02/14/2016   NA 138 02/20/2016   K 3.5 02/20/2016   CL 102 02/20/2016   CREATININE 0.77 02/20/2016   BUN 13 02/20/2016   CO2 28 02/20/2016   TSH 1.572 12/16/2014   INR 2.4 09/16/2016   HGBA1C 6.0 (H) 02/14/2016    Ecg today shows AV sequential pacing with bigeminy. Rate 67. I have personally reviewed and interpreted this study.  Echo dated 03/13/16: Study Conclusions  - Left ventricle: The cavity size was normal. There was moderate   concentric hypertrophy. Systolic function was mildly to   moderately reduced. The estimated ejection fraction was in the   range of 40% to 45%. Hypokinesis of the anterolateral and   inferolateral myocardium; consistent with ischemia in the   distribution of the left circumflex coronary artery. Doppler   parameters are consistent with abnormal left ventricular   relaxation (grade 1 diastolic dysfunction). Doppler parameters   are consistent with elevated mean left atrial filling pressure. - Ventricular septum: Septal motion showed paradox. These changes   are consistent with a left bundle branch block. - Aortic valve: A stent-valve bioprosthesis (TAVR) was present and   functioning normally. - Left atrium: The atrium was severely dilated. - Right ventricle: The cavity size was mildly dilated.  Impressions:  - Aortic valve gradients are similar to the 02/18/2016 study.  ------------------------------------------------------------------- Labs, prior tests, procedures, and surgery: Status post TAVR (26 mm Edwards Sapien 3  Transcatheter Bioprosthesis model # [id number]; serial # [id number]).  Coronary artery bypass grafting.  Assessment / Plan: 1. Coronary disease with remote coronary bypass surgery in 1996. Cath in March 2016 showed high grade disease in SVG to OM.  s/p successful PCI with DES. Other grafts patent. Native vessels all occluded. Repeat cath in January 2018 showed patent grafts.  Angina has decreased since his TAVR procedure. His chronic chest pain is improved. We will see if we can wean off Imdur now.   2.  Severe aortic stenosis. S/p TAVR with #23 Edwards Sapien valve.   3. Chronic systolic CHF. Asymptomatic. On ACEi and Toprol XL. EF improved to 40-45%. Continue Rx.   4. Syncope-  Now s/p BiV ICD.   5. Obesity.   6. Hyperlipidemia. On lipitor.   7. LBBB

## 2016-10-13 ENCOUNTER — Other Ambulatory Visit: Payer: Self-pay | Admitting: Cardiology

## 2016-10-14 ENCOUNTER — Other Ambulatory Visit: Payer: Self-pay

## 2016-10-14 ENCOUNTER — Encounter: Payer: Self-pay | Admitting: Cardiology

## 2016-10-14 ENCOUNTER — Ambulatory Visit (INDEPENDENT_AMBULATORY_CARE_PROVIDER_SITE_OTHER): Payer: Medicare Other | Admitting: Cardiology

## 2016-10-14 VITALS — BP 96/50 | HR 67 | Ht 70.0 in | Wt 215.4 lb

## 2016-10-14 DIAGNOSIS — Z952 Presence of prosthetic heart valve: Secondary | ICD-10-CM | POA: Diagnosis not present

## 2016-10-14 DIAGNOSIS — I350 Nonrheumatic aortic (valve) stenosis: Secondary | ICD-10-CM

## 2016-10-14 DIAGNOSIS — Z95810 Presence of automatic (implantable) cardiac defibrillator: Secondary | ICD-10-CM

## 2016-10-14 DIAGNOSIS — I5022 Chronic systolic (congestive) heart failure: Secondary | ICD-10-CM | POA: Diagnosis not present

## 2016-10-14 DIAGNOSIS — I25729 Atherosclerosis of autologous artery coronary artery bypass graft(s) with unspecified angina pectoris: Secondary | ICD-10-CM | POA: Diagnosis not present

## 2016-10-14 DIAGNOSIS — I447 Left bundle-branch block, unspecified: Secondary | ICD-10-CM

## 2016-10-14 MED ORDER — NITROGLYCERIN 0.4 MG SL SUBL: SUBLINGUAL_TABLET | SUBLINGUAL | 11 refills | Status: DC

## 2016-10-14 MED ORDER — PANTOPRAZOLE SODIUM 40 MG PO TBEC: 40.0000 mg | DELAYED_RELEASE_TABLET | Freq: Every day | ORAL | 3 refills | Status: DC

## 2016-10-14 MED ORDER — METOPROLOL SUCCINATE ER 50 MG PO TB24
50.0000 mg | ORAL_TABLET | Freq: Every day | ORAL | 3 refills | Status: DC
Start: 2016-10-14 — End: 2017-11-25

## 2016-10-14 MED ORDER — ISOSORBIDE MONONITRATE ER 60 MG PO TB24: 60.0000 mg | ORAL_TABLET | Freq: Every day | ORAL | 3 refills | Status: DC

## 2016-10-14 MED ORDER — ATORVASTATIN CALCIUM 80 MG PO TABS: 80.0000 mg | ORAL_TABLET | Freq: Every day | ORAL | 3 refills | Status: DC

## 2016-10-14 MED ORDER — RAMIPRIL 5 MG PO CAPS: 5.0000 mg | ORAL_CAPSULE | Freq: Every day | ORAL | 3 refills | Status: DC

## 2016-10-14 MED ORDER — METOPROLOL SUCCINATE ER 50 MG PO TB24: 50.0000 mg | ORAL_TABLET | Freq: Every day | ORAL | 3 refills | Status: DC

## 2016-10-14 NOTE — Patient Instructions (Signed)
Reduce Imdur (isosorbide) to 30 mg daily. If your chest pain is doing well you may discontinue when the prescription runs out.  Continue your other therapy  I will see you in 6 months.

## 2016-10-28 ENCOUNTER — Encounter: Payer: Self-pay | Admitting: Internal Medicine

## 2016-11-04 ENCOUNTER — Other Ambulatory Visit: Payer: Self-pay | Admitting: Cardiology

## 2016-11-06 ENCOUNTER — Other Ambulatory Visit: Payer: Self-pay | Admitting: Cardiology

## 2016-11-06 ENCOUNTER — Other Ambulatory Visit: Payer: Self-pay

## 2016-11-06 MED ORDER — NITROGLYCERIN 0.4 MG SL SUBL: SUBLINGUAL_TABLET | SUBLINGUAL | 1 refills | Status: DC

## 2016-11-10 ENCOUNTER — Ambulatory Visit (INDEPENDENT_AMBULATORY_CARE_PROVIDER_SITE_OTHER): Payer: Medicare Other | Admitting: Internal Medicine

## 2016-11-10 ENCOUNTER — Encounter: Payer: Self-pay | Admitting: Internal Medicine

## 2016-11-10 VITALS — BP 98/62 | HR 60 | Ht 70.0 in | Wt 216.0 lb

## 2016-11-10 DIAGNOSIS — I5022 Chronic systolic (congestive) heart failure: Secondary | ICD-10-CM

## 2016-11-10 DIAGNOSIS — I447 Left bundle-branch block, unspecified: Secondary | ICD-10-CM

## 2016-11-10 DIAGNOSIS — Z95810 Presence of automatic (implantable) cardiac defibrillator: Secondary | ICD-10-CM | POA: Diagnosis not present

## 2016-11-10 LAB — CUP PACEART INCLINIC DEVICE CHECK
Brady Statistic AP VP Percent: 42.69 %
Brady Statistic AS VP Percent: 54.49 %
Brady Statistic RA Percent Paced: 39.75 %
Brady Statistic RV Percent Paced: 90.46 %
HIGH POWER IMPEDANCE MEASURED VALUE: 72 Ohm
Implantable Lead Implant Date: 20161205
Implantable Lead Location: 753858
Implantable Lead Location: 753860
Implantable Lead Model: 5076
Lead Channel Impedance Value: 361 Ohm
Lead Channel Impedance Value: 361 Ohm
Lead Channel Impedance Value: 418 Ohm
Lead Channel Impedance Value: 456 Ohm
Lead Channel Impedance Value: 589 Ohm
Lead Channel Impedance Value: 665 Ohm
Lead Channel Pacing Threshold Amplitude: 0.625 V
Lead Channel Pacing Threshold Amplitude: 0.625 V
Lead Channel Pacing Threshold Pulse Width: 0.4 ms
Lead Channel Pacing Threshold Pulse Width: 0.4 ms
Lead Channel Setting Sensing Sensitivity: 0.3 mV
MDC IDC LEAD IMPLANT DT: 20161205
MDC IDC LEAD IMPLANT DT: 20161205
MDC IDC LEAD LOCATION: 753859
MDC IDC MSMT BATTERY REMAINING LONGEVITY: 72 mo
MDC IDC MSMT BATTERY VOLTAGE: 2.98 V
MDC IDC MSMT LEADCHNL LV IMPEDANCE VALUE: 361 Ohm
MDC IDC MSMT LEADCHNL LV IMPEDANCE VALUE: 418 Ohm
MDC IDC MSMT LEADCHNL LV IMPEDANCE VALUE: 608 Ohm
MDC IDC MSMT LEADCHNL LV IMPEDANCE VALUE: 665 Ohm
MDC IDC MSMT LEADCHNL LV IMPEDANCE VALUE: 703 Ohm
MDC IDC MSMT LEADCHNL LV PACING THRESHOLD AMPLITUDE: 0.875 V
MDC IDC MSMT LEADCHNL RA PACING THRESHOLD PULSEWIDTH: 0.4 ms
MDC IDC MSMT LEADCHNL RA SENSING INTR AMPL: 1.375 mV
MDC IDC MSMT LEADCHNL RA SENSING INTR AMPL: 2.875 mV
MDC IDC MSMT LEADCHNL RV IMPEDANCE VALUE: 399 Ohm
MDC IDC MSMT LEADCHNL RV IMPEDANCE VALUE: 456 Ohm
MDC IDC MSMT LEADCHNL RV SENSING INTR AMPL: 19.25 mV
MDC IDC MSMT LEADCHNL RV SENSING INTR AMPL: 19.25 mV
MDC IDC PG IMPLANT DT: 20161205
MDC IDC SESS DTM: 20181030100701
MDC IDC SET LEADCHNL LV PACING AMPLITUDE: 2 V
MDC IDC SET LEADCHNL LV PACING PULSEWIDTH: 0.4 ms
MDC IDC SET LEADCHNL RA PACING AMPLITUDE: 2 V
MDC IDC SET LEADCHNL RV PACING AMPLITUDE: 2.5 V
MDC IDC SET LEADCHNL RV PACING PULSEWIDTH: 0.4 ms
MDC IDC STAT BRADY AP VS PERCENT: 0.3 %
MDC IDC STAT BRADY AS VS PERCENT: 2.52 %

## 2016-11-10 NOTE — Progress Notes (Signed)
HPI [name] returns today for ongoing evaluation and management of his BiV ICD. He has undergone TAVR and multiple dental extractions. He denies chest pain or sob. No edema. He feels well. No ICD therapies. Allergies  Allergen Reactions  . No Known Allergies      Current Outpatient Prescriptions  Medication Sig Dispense Refill  . Ascorbic Acid (VITAMIN C) 1000 MG tablet Take 1,000 mg by mouth daily.    Marland Kitchen aspirin EC 81 MG EC tablet Take 1 tablet (81 mg total) by mouth daily.    Marland Kitchen atorvastatin (LIPITOR) 80 MG tablet Take 1 tablet (80 mg total) by mouth daily. 90 tablet 3  . Cholecalciferol (VITAMIN D-3) 5000 units TABS Take 1 tablet by mouth daily.    . finasteride (PROSCAR) 5 MG tablet Take 5 mg by mouth daily.    . metoprolol succinate (TOPROL-XL) 50 MG 24 hr tablet Take 1 tablet (50 mg total) by mouth daily. Take with or immediately following a meal. 90 tablet 3  . nitroGLYCERIN (NITROSTAT) 0.4 MG SL tablet DISSOLVE 1 TABLET UNDER TONGUE FOR CHEST PAIN -MAY REPEAT IF PAIN REMAINS AFTER 5 MIN, CALL 911 AND REPEAT DOSE. MAX 3 TABLETS IN 15 MINUTES 100 tablet 9  . oxybutynin (DITROPAN) 5 MG tablet Take 5 mg by mouth daily.     . pantoprazole (PROTONIX) 40 MG tablet Take 1 tablet (40 mg total) by mouth daily. 90 tablet 3  . ramipril (ALTACE) 5 MG capsule Take 1 capsule (5 mg total) by mouth daily. 90 capsule 3  . valACYclovir (VALTREX) 500 MG tablet Take 500 mg by mouth daily as needed (outbreak).      No current facility-administered medications for this visit.      Past Medical History:  Diagnosis Date  . Aortic stenosis   . Barrett's esophageal ulceration   . Chronic systolic CHF (congestive heart failure) ([place])   . Coronary artery disease    a. CABG 1996: LIMA graft to the LAD and diagonal, sequential vein graft to the acute marginal, PDA, and posterior lateral branches of the right coronary, sequential saphenous vein graft to the obtuse marginal vessel and distal  circumflex. b. Cath 2007 patent grafts. c. s/p DES to SVG-OM2 in 03/2014.  Marland Kitchen Coronary artery disease involving autologous vein coronary bypass graft with angina pectoris ([place]) 03/13/2014   PCI using DES in SVG to LCx system  . Dental caries    pre-heart valve surgery protocol  . Gastroesophageal reflux disease   . History of erectile dysfunction   . History of hiatal hernia   . History of kidney stones   . History of obesity   . History of prostatitis   . Hypercholesterolemia   . Hypertension   . Kidney stones   . LBBB (left bundle branch block)   . Nodule of kidney 02/04/2016   Incidental 75mm nodule upper pole right kidney noted on CT angiogram - MRI with and without gadolinium contrast recommened in 6 months  . OSA (obstructive sleep apnea)    "suppose to wear mask; I don't" (03/13/2014)  . PVC's (premature ventricular contractions)   . S/P CABG x 7 01/07/1995   LIMA to LAD-diagonal, SVG to OM-LCx, SVG to AM-PD-RPL  . S/P TAVR (transcatheter aortic valve replacement) 02/18/2016   26 mm Edwards Sapien 3 transcatheter heart valve placed via percutaneous right transfemoral approach    ROS:   All systems reviewed and negative except as noted in the HPI.  Past Surgical History:  Procedure Laterality Date  . [place]; ~ 2006   ; Ejection fraction is estimated at 45%  . CARDIAC CATHETERIZATION N/A 01/22/2016   Procedure: Right/Left Heart Cath and Coronary/Graft Angiography;  Surgeon: [name] [name], MD;  Location: [place] [facility];  Service: Cardiovascular;  Laterality: N/A;  . CHOLECYSTECTOMY N/A 01/23/2014   Procedure: LAPAROSCOPIC CHOLECYSTECTOMY WITH INTRAOPERATIVE CHOLANGIOGRAM ;  Surgeon: Fanny Skates, MD;  Location: [place];  Service: General;  Laterality: N/A;  . COLONOSCOPY W/ POLYPECTOMY    . CORONARY ANGIOPLASTY WITH STENT PLACEMENT  03/13/2014   "1"  . CORONARY ARTERY BYPASS GRAFT  1996   "CABG X 7"  . CYSTOSCOPY WITH STENT PLACEMENT    . EP  IMPLANTABLE DEVICE N/A 12/17/2014   Procedure: BiV ICD Insertion CRT-D;  Surgeon: Evans Lance, MD;  Location: [place] [facility];  Service: Cardiovascular;  Laterality: N/A;  . LEFT HEART CATHETERIZATION WITH CORONARY/GRAFT ANGIOGRAM N/A 03/13/2014   Procedure: LEFT HEART CATHETERIZATION WITH Beatrix Fetters;  Surgeon: [name] [name], MD;  Location: [facility] CATH [facility];  Service: Cardiovascular;  Laterality: N/A;  . MULTIPLE EXTRACTIONS WITH ALVEOLOPLASTY N/A 02/07/2016   Procedure: Extraction of tooth #'s 1,2,3,14,15, 18, 20, 29,30,31 with alveoloplasty and dental cleaning of teeth.;  Surgeon: Lenn Cal, DDS;  Location: [place];  Service: Oral Surgery;  Laterality: N/A;  . TEE WITHOUT CARDIOVERSION N/A 02/18/2016   Procedure: TRANSESOPHAGEAL ECHOCARDIOGRAM (TEE);  Surgeon: Sherren Mocha, MD;  Location: [place];  Service: Open Heart Surgery;  Laterality: N/A;  . TONSILLECTOMY  ~ 1950  . TRANSCATHETER AORTIC VALVE REPLACEMENT, TRANSFEMORAL N/A 02/18/2016   Procedure: TRANSCATHETER AORTIC VALVE REPLACEMENT, TRANSFEMORAL;  Surgeon: Sherren Mocha, MD;  Location: [place];  Service: Open Heart Surgery;  Laterality: N/A;     Family History  Problem Relation Age of Onset  . Heart attack Father   . Cancer Sister        Uterine Cancer     Social History   Social History  . Marital status: Married    Spouse name: N/A  . Number of children: 3  . Years of education: N/A   Occupational History  . sales Unemployed    retired   Social History Main Topics  . Smoking status: Former Smoker    Packs/day: 0.75    Years: 15.00    Types: Cigarettes    Quit date: 01/12/1997  . Smokeless tobacco: Never Used     Comment: 3/4 pack per day. smoked 15 years off and on  . Alcohol use No  . Drug use: No  . Sexual activity: Yes   Other Topics Concern  . Not on file   Social History Narrative  . No narrative on file     BP 98/62   Pulse 60   Ht 5\' 10"  (1.778 m)   Wt 216 lb (98 kg)   SpO2 95%    BMI 30.99 kg/m   Physical Exam:  Well appearing 71 yo man, NAD HEENT: Unremarkable Neck:  6 cm JVD, no thyromegally Lymphatics:  No adenopathy Back:  No CVA tenderness Lungs:  Clear with no wheezes HEART:  Regular rate rhythm, no murmurs, no rubs, no clicks Abd:  soft, positive bowel sounds, no organomegally, no rebound, no guarding Ext:  2 plus pulses, no edema, no cyanosis, no clubbing Skin:  No rashes no nodules Neuro:  CN II through XII intact, motor grossly intact   DEVICE  Normal device function.  See PaceArt for details.   Assess/Plan: 1. Chronic systolic heart failure - his symptoms appear to be class 1. He will continue his current meds. 2. ICD - his medtronic BiV ICD is working normally. Will recheck in several months. 3. AS - he is s/p TAVR and is doing well. No symptoms.  Mikle Bosworth.D.

## 2016-11-10 NOTE — Patient Instructions (Signed)
Medication Instructions:  Your physician recommends that you continue on your current medications as directed. Please refer to the Current Medication list given to you today.  Labwork: None ordered.  Testing/Procedures: None ordered.  Follow-Up: Your physician wants you to follow-up in: one year with Dr. Lovena Le.   You will receive a reminder letter in the mail two months in advance. If you don't receive a letter, please call our office to schedule the follow-up appointment.  Remote monitoring is used to monitor your ICD from home. This monitoring reduces the number of office visits required to check your device to one time per year. It allows [place] to keep an eye on the functioning of your device to ensure it is working properly. You are scheduled for a device check from home on 02/10/2016. You may send your transmission at any time that day. If you have a wireless device, the transmission will be sent automatically. After your physician reviews your transmission, you will receive a postcard with your next transmission date.    Any Other Special Instructions Will Be Listed Below (If Applicable).     If you need a refill on your cardiac medications before your next appointment, please call your pharmacy.

## 2017-02-11 ENCOUNTER — Ambulatory Visit (INDEPENDENT_AMBULATORY_CARE_PROVIDER_SITE_OTHER): Payer: Medicare Other | Admitting: *Deleted

## 2017-02-11 DIAGNOSIS — I5022 Chronic systolic (congestive) heart failure: Secondary | ICD-10-CM

## 2017-02-11 NOTE — Progress Notes (Signed)
Remote ICD transmission.   

## 2017-02-12 ENCOUNTER — Encounter: Payer: Self-pay | Admitting: Cardiology

## 2017-02-15 NOTE — Progress Notes (Signed)
[facility] [place]                                       Cardiology Office Note    Date:  02/17/2017   ID:  [name], DOB [birth year], MRN [id number]  PCP:  Janeann Merl, MD  Cardiologist:  Dr. [name] / Dr. Lovena Le (EP) / Dr. Burt Knack & Dr. Roxy Manns (TAVR)  CC: 1 year s/p TAVR  History of Present Illness:  [name] is a 72 y.o. male with a history of CAD s/p CABG and subsequent stenting, LBBB, chronic systolic CHF s/p BiV ICD, low gradient, low flow severe AS s/p TAVR (02/2016) who presents to clinic for follow up.  The patient has a long-standing history of coronary artery disease and underwent multivessel CABG in 1996. He has undergone saphenous vein graft intervention in the past. He developed progressive aortic stenosis and worsening of LV systolic function with LVEF declining to 30-35%. The patient underwent cardiac resynchronization with a biventricular ICD implantation. He was noted to have a mean transvalvular gradient of 31 mmHg prior to TAVR. He underwent uncomplicated TAVR 97/67/3419 via a percutaneous right transfemoral approach. He was treated with a 26 mm Edwards Sapien 3 transcatheter heart valve in 02/2016.  30 day post op echo showed EF 40-45% with peak and mean transvalvular gradients 28 and 16 mmHg, respectively and mild paravalvular regurgitation. The patient's transvalvular gradients were felt to be modestly elevated especially in the setting of his LV dysfunction. Cardiac CTA showed HALT (hypoattenuated leaflet thickening) and RLM (reduced leaflet motion) of one of the leaflets. His plavix was discontinued and he was started on Coumadin with plans for repeat echo in 6 months, which was never completed. He did stop coumadin after 6 months. He has been on ASA 81 mg daily.   Today he presents to clinic for follow up. He is very active walking 3-4 times a week and doing a lot of activities out in the yard and in his shop. He  has to be lifting something very heavy or doing moderate-strenuous activity to have dyspnea. He occasionally gets dizzy when standing up from sitting but no syncope. He thinks he gets dehydrated because he doesn't drink a lot of fluids. No CP. No LE edema, orthopnea or PND No blood in stool or urine. No palpitations. Energy levels are okay.     Past Medical History:  Diagnosis Date  . Aortic stenosis   . Barrett's esophageal ulceration   . Chronic systolic CHF (congestive heart failure) ([place])   . Coronary artery disease    a. CABG 1996: LIMA graft to the LAD and diagonal, sequential vein graft to the acute marginal, PDA, and posterior lateral branches of the right coronary, sequential saphenous vein graft to the obtuse marginal vessel and distal circumflex. b. Cath 2007 patent grafts. c. s/p DES to SVG-OM2 in 03/2014.  Marland Kitchen Coronary artery disease involving autologous vein coronary bypass graft with angina pectoris ([place]) 03/13/2014   PCI using DES in SVG to LCx system  . Dental caries    pre-heart valve surgery protocol  . Gastroesophageal reflux disease   . History of erectile dysfunction   . History of hiatal hernia   . History of kidney stones   . History of obesity   . History of prostatitis   . Hypercholesterolemia   . Hypertension   .  Kidney stones   . LBBB (left bundle branch block)   . Nodule of kidney 02/04/2016   Incidental 64mm nodule upper pole right kidney noted on CT angiogram - MRI with and without gadolinium contrast recommened in 6 months  . OSA (obstructive sleep apnea)    "suppose to wear mask; I don't" (03/13/2014)  . PVC's (premature ventricular contractions)   . S/P CABG x 7 01/07/1995   LIMA to LAD-diagonal, SVG to OM-LCx, SVG to AM-PD-RPL  . S/P TAVR (transcatheter aortic valve replacement) 02/18/2016   26 mm Edwards Sapien 3 transcatheter heart valve placed via percutaneous right transfemoral approach    Past Surgical History:  Procedure Laterality Date  . [place]; ~ 2006   ; Ejection fraction is estimated at 45%  . CARDIAC CATHETERIZATION N/A 01/22/2016   Procedure: Right/Left Heart Cath and Coronary/Graft Angiography;  Surgeon: [name] [name], MD;  Location: [place] [facility];  Service: Cardiovascular;  Laterality: N/A;  . CHOLECYSTECTOMY N/A 01/23/2014   Procedure: LAPAROSCOPIC CHOLECYSTECTOMY WITH INTRAOPERATIVE CHOLANGIOGRAM ;  Surgeon: Fanny Skates, MD;  Location: [place];  Service: General;  Laterality: N/A;  . COLONOSCOPY W/ POLYPECTOMY    . CORONARY ANGIOPLASTY WITH STENT PLACEMENT  03/13/2014   "1"  . CORONARY ARTERY BYPASS GRAFT  1996   "CABG X 7"  . CYSTOSCOPY WITH STENT PLACEMENT    . EP IMPLANTABLE DEVICE N/A 12/17/2014   Procedure: BiV ICD Insertion CRT-D;  Surgeon: Evans Lance, MD;  Location: [place] [facility];  Service: Cardiovascular;  Laterality: N/A;  . LEFT HEART CATHETERIZATION WITH CORONARY/GRAFT ANGIOGRAM N/A 03/13/2014   Procedure: LEFT HEART CATHETERIZATION WITH Beatrix Fetters;  Surgeon: [name] [name], MD;  Location: [facility];  Service: Cardiovascular;  Laterality: N/A;  . MULTIPLE EXTRACTIONS WITH ALVEOLOPLASTY N/A 02/07/2016   Procedure: Extraction of tooth #'s 1,2,3,14,15, 18, 20, 29,30,31 with alveoloplasty and dental cleaning of teeth.;  Surgeon: Lenn Cal, DDS;  Location: [place];  Service: Oral Surgery;  Laterality: N/A;  . TEE WITHOUT CARDIOVERSION N/A 02/18/2016   Procedure: TRANSESOPHAGEAL ECHOCARDIOGRAM (TEE);  Surgeon: Sherren Mocha, MD;  Location: [place];  Service: Open Heart Surgery;  Laterality: N/A;  . TONSILLECTOMY  ~ 1950  . TRANSCATHETER AORTIC VALVE REPLACEMENT, TRANSFEMORAL N/A 02/18/2016   Procedure: TRANSCATHETER AORTIC VALVE REPLACEMENT, TRANSFEMORAL;  Surgeon: Sherren Mocha, MD;  Location: [place];  Service: Open Heart Surgery;  Laterality: N/A;    Current Medications: Outpatient Medications Prior to Visit  Medication Sig Dispense Refill  . Ascorbic Acid (VITAMIN  C) 1000 MG tablet Take 1,000 mg by mouth daily.    Marland Kitchen aspirin EC 81 MG EC tablet Take 1 tablet (81 mg total) by mouth daily.    Marland Kitchen atorvastatin (LIPITOR) 80 MG tablet Take 1 tablet (80 mg total) by mouth daily. 90 tablet 3  . Cholecalciferol (VITAMIN D-3) 5000 units TABS Take 1 tablet by mouth daily.    . finasteride (PROSCAR) 5 MG tablet Take 5 mg by mouth daily.    . metoprolol succinate (TOPROL-XL) 50 MG 24 hr tablet Take 1 tablet (50 mg total) by mouth daily. Take with or immediately following a meal. 90 tablet 3  . nitroGLYCERIN (NITROSTAT) 0.4 MG SL tablet DISSOLVE 1 TABLET UNDER TONGUE FOR CHEST PAIN -MAY REPEAT IF PAIN REMAINS AFTER 5 MIN, CALL 911 AND REPEAT DOSE. MAX 3 TABLETS IN 15 MINUTES 100 tablet 9  . oxybutynin (DITROPAN) 5 MG tablet Take 5 mg by mouth daily.     Marland Kitchen  pantoprazole (PROTONIX) 40 MG tablet Take 1 tablet (40 mg total) by mouth daily. 90 tablet 3  . ramipril (ALTACE) 5 MG capsule Take 1 capsule (5 mg total) by mouth daily. 90 capsule 3  . valACYclovir (VALTREX) 500 MG tablet Take 500 mg by mouth daily as needed (outbreak).      No facility-administered medications prior to visit.      Allergies:   No known allergies   Social History   Socioeconomic History  . Marital status: Married    Spouse name: None  . Number of children: 3  . Years of education: None  . Highest education level: None  Social Needs  . Financial resource strain: None  . Food insecurity - worry: None  . Food insecurity - inability: None  . Transportation needs - medical: None  . Transportation needs - non-medical: None  Occupational History  . 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 Smoking status: Former Smoker    Packs/day: 0.75    Years: 15.00    Pack years: 11.25    Types: Cigarettes    Last attempt to quit: 01/12/1997    Years since quitting: 20.1  . Smokeless tobacco: Never Used  . Tobacco comment: 3/4 pack per day. smoked 15 years off and  on  Substance and Sexual Activity  . Alcohol use: No    Alcohol/week: 2.4 oz    Types: 4 Cans of beer per week  . Drug use: No  . Sexual activity: Yes  Other Topics Concern  . None  Social History Narrative  . None     Family History:  The patient's family history includes Cancer in his sister; Heart attack in his father.      ROS:   Please see the history of present illness.    ROS All other systems reviewed and are negative.   PHYSICAL EXAM:   VS:  BP 138/72   Pulse 63   Ht 5\' 10"  (1.778 m)   Wt 220 lb 12.8 oz (100.2 kg)   SpO2 96%   BMI 31.68 kg/m    GEN: Well nourished, well developed, in no acute distress  HEENT: normal  Neck: no JVD, carotid bruits, or masses Cardiac: RRR; soft flow murmur. No rubs, or gallops,no edema  Respiratory:  clear to auscultation bilaterally, normal work of breathing GI: soft, nontender, nondistended, + BS MS: no deformity or atrophy  Skin: warm and dry, no rash Neuro:  Alert and Oriented x 3, Strength and sensation are intact Psych: euthymic mood, full affect   Wt Readings from Last 3 Encounters:  02/17/17 220 lb 12.8 oz (100.2 kg)  11/10/16 216 lb (98 kg)  10/14/16 215 lb 6.4 oz (97.7 kg)      Studies/Labs Reviewed:   EKG:  EKG is NOT ordered today.   Recent Labs: 02/19/2016: Hemoglobin 11.9; Magnesium 1.5; Platelets 130 02/20/2016: BUN 13; Creatinine, Ser 0.77; Potassium 3.5; Sodium 138   Lipid Panel    Component Value Date/Time   CHOL 150 03/12/2015 0932   TRIG 105 03/12/2015 0932   HDL 38 (L) 03/12/2015 0932   CHOLHDL 3.9 03/12/2015 0932   VLDL 21 03/12/2015 0932   LDLCALC 91 03/12/2015 0932    Additional studies/ records that were reviewed today include:  2D ECHO 03/13/16 (30 day s/p TAVR) Study Conclusions - Left ventricle: The cavity size was normal. There was moderate   concentric hypertrophy. Systolic function was mildly  to   moderately reduced. The estimated ejection fraction was in the   range of 40% to  45%. Hypokinesis of the anterolateral and   inferolateral myocardium; consistent with ischemia in the   distribution of the left circumflex coronary artery. Doppler   parameters are consistent with abnormal left ventricular   relaxation (grade 1 diastolic dysfunction). Doppler parameters   are consistent with elevated mean left atrial filling pressure. - Ventricular septum: Septal motion showed paradox. These changes   are consistent with a left bundle branch block. - Aortic valve: A stent-valve bioprosthesis (TAVR) was present and   functioning normally. - Left atrium: The atrium was severely dilated. - Right ventricle: The cavity size was mildly dilated. Impressions: - Aortic valve gradients are similar to the 02/18/2016 study.  _______________  Cardiac CT 03/25/16 IMPRESSION: 1) HALT and RLM of one of the 26 mm Sapien 3 leaflets (closest to the non coronary sinus) likely responsible for the progressive TTE gradients. Patient would appear to have Potential benefit from anticoagulation  2) LM and RCA ostia above the superior aspect of the stented valve with no obstruction  3) Good expansion of the 26 mm Sapien 3 valve with outside perimeter and area of 88 mm and 570 mm2 respectively  4) Good position of the stented valve within the annulus and root  5) Normal aortic root 31 mm with no trauma  _______________  2D ECHO 02/17/17 ( 1 year s/p TAVR) Study Conclusions - Left ventricle: The cavity size was normal. Wall thickness was   increased in a pattern of mild LVH. Systolic function was mildly   to moderately reduced. The estimated ejection fraction was in the   range of 40% to 45%. Diffuse hypokinesis. Doppler parameters are   consistent with abnormal left ventricular relaxation (grade 1   diastolic dysfunction). - Aortic valve: Bioprosthetic aortic valve s/p TAVR. Trivial   perivalvular aortic regurgitation. No significant stenosis. Mean   gradient (S): 15 mm Hg. -  Mitral valve: There was no significant regurgitation. - Left atrium: The atrium was mildly to moderately dilated. - Right ventricle: The cavity size was mildly dilated. Pacer wire   or catheter noted in right ventricle. Systolic function was   mildly reduced. - Right atrium: The atrium was mildly dilated. - Tricuspid valve: Peak RV-RA gradient (S): 25 mm Hg. - Systemic veins: The IVC was not visualized. Impressions: - Normal LV size with mild LV hypertrophy. EF 40-45%, diffuse   hypokinesis. Mildly dilated RV with mildly decreased systolic   function. Normally functioning bioprosthetic aortic valve s/p   TAVR, trivial perivalvular regurgitation.   ASSESSMENT & PLAN:   Severe AS s/p TAVR: he was treated with ASA 81 mg daily and coumadin x 59months due to concern for subacute leaflet thrombosis. 2D ECHO today shows EF 40-45%, normally functioning TAVR valve with no PVL and mean/peak gradient 15/27 mmHg (improved from 19/5mm Hg). He has NYHA class I symptoms. He is aware of the need for ongoing SBE prophylaxis. He will continue on daily ASA 81 mg.   CAD: continue medical management.   Chronic systolic CHF s/p BiV ICD: 2D ECHO today showed EF stable at 40-45%. Continue Toprol XL and Ramipril. He appears euvolemic  HLD: continue statin   Incidental finding: of note, pre TAVR CT showed a new 8 mm lesion in the upper pole the right kidney is technically too small to characterize, but may demonstrate some internal enhancement. Follow-up MRI of the abdomen with and without IV  gadolinium is recommended in 6 months to re-evaluate this lesion and exclude neoplasm.  We discussed this and he would rather not get any follow up imaging. I told him if he changes his mind, he can call us at a later time and we can have it set up    Medication Adjustments/Labs and Tests Ordered: Current medicines are reviewed at length with the patient today.  Concerns regarding medicines are outlined above.  Medication  changes, Labs and Tests ordered today are listed in the Patient Instructions below. Patient Instructions  Medication Instructions:  Your physician discussed the importance of taking an antibiotic prior to any dental, gastrointestinal, genitourinary procedures to prevent damage to the heart valves from infection. You were given a prescription for an antibiotic based on current SBE prophylaxis guidelines. You will take Amoxil 2g (four 500 mg tablets) one hour prior to dental procedures.  Labwork: None  Testing/Procedures: None  Follow-Up: Your provider recommends that you schedule a follow-up appointment as needed with the Structural Heart Team!  Any Other Special Instructions Will Be Listed Below (If Applicable).     If you need a refill on your cardiac medications before your next appointment, please call your pharmacy.      Signed, Angelena Form, PA-C  02/17/2017 2:25 PM    [facility] [address], [place], [place]  [address] Phone: [phone]; Fax: [phone]

## 2017-02-17 ENCOUNTER — Ambulatory Visit (HOSPITAL_COMMUNITY): Payer: Medicare Other | Attending: Cardiology

## 2017-02-17 ENCOUNTER — Encounter (HOSPITAL_COMMUNITY): Payer: Self-pay | Admitting: *Deleted

## 2017-02-17 ENCOUNTER — Encounter: Payer: Self-pay | Admitting: Physician Assistant

## 2017-02-17 ENCOUNTER — Ambulatory Visit (INDEPENDENT_AMBULATORY_CARE_PROVIDER_SITE_OTHER): Payer: Medicare Other | Admitting: Physician Assistant

## 2017-02-17 ENCOUNTER — Other Ambulatory Visit: Payer: Self-pay

## 2017-02-17 VITALS — BP 138/72 | HR 63 | Ht 70.0 in | Wt 220.8 lb

## 2017-02-17 DIAGNOSIS — N289 Disorder of kidney and ureter, unspecified: Secondary | ICD-10-CM

## 2017-02-17 DIAGNOSIS — I5030 Unspecified diastolic (congestive) heart failure: Secondary | ICD-10-CM | POA: Insufficient documentation

## 2017-02-17 DIAGNOSIS — Z952 Presence of prosthetic heart valve: Secondary | ICD-10-CM | POA: Diagnosis present

## 2017-02-17 DIAGNOSIS — I25729 Atherosclerosis of autologous artery coronary artery bypass graft(s) with unspecified angina pectoris: Secondary | ICD-10-CM

## 2017-02-17 DIAGNOSIS — I5022 Chronic systolic (congestive) heart failure: Secondary | ICD-10-CM

## 2017-02-17 DIAGNOSIS — E785 Hyperlipidemia, unspecified: Secondary | ICD-10-CM | POA: Diagnosis not present

## 2017-02-17 DIAGNOSIS — I420 Dilated cardiomyopathy: Secondary | ICD-10-CM | POA: Diagnosis not present

## 2017-02-17 DIAGNOSIS — I350 Nonrheumatic aortic (valve) stenosis: Secondary | ICD-10-CM

## 2017-02-17 MED ORDER — AMOXICILLIN 500 MG PO TABS: ORAL_TABLET | ORAL | 3 refills | Status: DC

## 2017-02-17 NOTE — Patient Instructions (Signed)
Medication Instructions:  Your physician discussed the importance of taking an antibiotic prior to any dental, gastrointestinal, genitourinary procedures to prevent damage to the heart valves from infection. You were given a prescription for an antibiotic based on current SBE prophylaxis guidelines. You will take Amoxil 2g (four 500 mg tablets) one hour prior to dental procedures.  Labwork: None  Testing/Procedures: None  Follow-Up: Your provider recommends that you schedule a follow-up appointment as needed with the Structural Heart Team!  Any Other Special Instructions Will Be Listed Below (If Applicable).     If you need a refill on your cardiac medications before your next appointment, please call your pharmacy.

## 2017-02-17 NOTE — Progress Notes (Unsigned)
Definity Contrast was suggested during the Echocardiogram to help with images, however patient does not feel it necessary and would like to hold off.    Deliah Boston, RDCS

## 2017-02-18 ENCOUNTER — Telehealth: Payer: Self-pay | Admitting: Cardiology

## 2017-02-18 NOTE — Telephone Encounter (Signed)
[name] is calling because he is wanting to get a referral for Podiatrist . Please call

## 2017-02-18 NOTE — Telephone Encounter (Signed)
Returned call to patient's wife.She stated husband does not have a PCP.Stated she would like Dr.Jordan to refer him to a Podiatrist.He has been having pain in both feet for over 2 months.Advised Dr.Jordan out of office.I will speak to him next week and call you back.

## 2017-02-23 NOTE — Telephone Encounter (Signed)
Follow up     Patient would like a referral to a podiatrist

## 2017-02-23 NOTE — Telephone Encounter (Signed)
Spoke with pt, he is having heel pain and it feels like he has calluses on his feet and they hurt. He has discomfort while wearing shoes and also when the sheets touch his feet. He would like to know who dr [name] knows for him to see for his feet. Aware cheryl is out this week. Will forward to dr [name] to review and advise.

## 2017-02-24 NOTE — Telephone Encounter (Signed)
Returned call to patient Dr.Jordan's recommendations given. 

## 2017-02-24 NOTE — Telephone Encounter (Signed)
He can try [facility].  Erminio Nygard [name] MD, [facility]

## 2017-02-26 LAB — CUP PACEART REMOTE DEVICE CHECK
Battery Voltage: 2.98 V
Brady Statistic AP VP Percent: 45.13 %
Brady Statistic RA Percent Paced: 41.68 %
Brady Statistic RV Percent Paced: 89.06 %
HIGH POWER IMPEDANCE MEASURED VALUE: 72 Ohm
Implantable Lead Implant Date: 20161205
Implantable Lead Implant Date: 20161205
Implantable Lead Location: 753858
Implantable Lead Location: 753859
Implantable Lead Location: 753860
Implantable Lead Model: 4598
Implantable Lead Model: 5076
Implantable Lead Model: 6935
Lead Channel Impedance Value: 342 Ohm
Lead Channel Impedance Value: 361 Ohm
Lead Channel Impedance Value: 418 Ohm
Lead Channel Impedance Value: 589 Ohm
Lead Channel Impedance Value: 646 Ohm
Lead Channel Pacing Threshold Amplitude: 0.5 V
Lead Channel Sensing Intrinsic Amplitude: 1.25 mV
Lead Channel Sensing Intrinsic Amplitude: 15.625 mV
Lead Channel Setting Pacing Amplitude: 2 V
Lead Channel Setting Pacing Amplitude: 2.5 V
MDC IDC LEAD IMPLANT DT: 20161205
MDC IDC MSMT BATTERY REMAINING LONGEVITY: 66 mo
MDC IDC MSMT LEADCHNL LV IMPEDANCE VALUE: 342 Ohm
MDC IDC MSMT LEADCHNL LV IMPEDANCE VALUE: 342 Ohm
MDC IDC MSMT LEADCHNL LV IMPEDANCE VALUE: 418 Ohm
MDC IDC MSMT LEADCHNL LV IMPEDANCE VALUE: 551 Ohm
MDC IDC MSMT LEADCHNL LV IMPEDANCE VALUE: 646 Ohm
MDC IDC MSMT LEADCHNL LV IMPEDANCE VALUE: 646 Ohm
MDC IDC MSMT LEADCHNL LV PACING THRESHOLD AMPLITUDE: 1 V
MDC IDC MSMT LEADCHNL LV PACING THRESHOLD PULSEWIDTH: 0.4 ms
MDC IDC MSMT LEADCHNL RA IMPEDANCE VALUE: 418 Ohm
MDC IDC MSMT LEADCHNL RA PACING THRESHOLD AMPLITUDE: 0.625 V
MDC IDC MSMT LEADCHNL RA PACING THRESHOLD PULSEWIDTH: 0.4 ms
MDC IDC MSMT LEADCHNL RA SENSING INTR AMPL: 1.25 mV
MDC IDC MSMT LEADCHNL RV IMPEDANCE VALUE: 418 Ohm
MDC IDC MSMT LEADCHNL RV PACING THRESHOLD PULSEWIDTH: 0.4 ms
MDC IDC MSMT LEADCHNL RV SENSING INTR AMPL: 15.625 mV
MDC IDC PG IMPLANT DT: 20161205
MDC IDC SESS DTM: 20190131130825
MDC IDC SET LEADCHNL LV PACING PULSEWIDTH: 0.4 ms
MDC IDC SET LEADCHNL RA PACING AMPLITUDE: 2 V
MDC IDC SET LEADCHNL RV PACING PULSEWIDTH: 0.4 ms
MDC IDC SET LEADCHNL RV SENSING SENSITIVITY: 0.3 mV
MDC IDC STAT BRADY AP VS PERCENT: 0.44 %
MDC IDC STAT BRADY AS VP PERCENT: 51.84 %
MDC IDC STAT BRADY AS VS PERCENT: 2.6 %

## 2017-03-10 ENCOUNTER — Ambulatory Visit: Payer: Medicare Other | Admitting: Podiatry

## 2017-03-10 ENCOUNTER — Encounter: Payer: Self-pay | Admitting: Podiatry

## 2017-03-10 ENCOUNTER — Ambulatory Visit (INDEPENDENT_AMBULATORY_CARE_PROVIDER_SITE_OTHER): Payer: Medicare Other

## 2017-03-10 DIAGNOSIS — M722 Plantar fascial fibromatosis: Secondary | ICD-10-CM | POA: Diagnosis not present

## 2017-03-10 DIAGNOSIS — I999 Unspecified disorder of circulatory system: Secondary | ICD-10-CM

## 2017-03-10 MED ORDER — TRIAMCINOLONE ACETONIDE 10 MG/ML IJ SUSP
10.0000 mg | Freq: Once | INTRAMUSCULAR | Status: AC
  Administered 2017-03-10: 10 mg

## 2017-03-10 NOTE — Progress Notes (Signed)
   Subjective:    Patient ID: [name], male    DOB: [birth year], 72 y.o.   MRN: [id number]  HPI    Review of Systems  All other systems reviewed and are negative.      Objective:   Physical Exam        Assessment & Plan:

## 2017-03-10 NOTE — Progress Notes (Signed)
Subjective:   Patient ID: [name], male   DOB: 72 y.o.   MRN: [id number]   HPI Patient presents stating there is a lot of pain in the right heel and also he has significant neuropathy bilateral with no apparent cause that ever been identified.  Patient does not smoke and likes to stay active   Review of Systems  All other systems reviewed and are negative.       Objective:  Physical Exam  Constitutional: He appears well-developed and well-nourished.  Cardiovascular: Intact distal pulses.  Pulmonary/Chest: Effort normal.  Musculoskeletal: Normal range of motion.  Neurological: He is alert.  Skin: Skin is warm.  Nursing note and vitals reviewed.   Vascular status intact with diminishment sharp dull vibratory bilateral.  Patient is found to have discomfort in the plantar heel right at the insertional point of the tendon into the calcaneus with inflammation fluid around the medial band and is noted to have moderate change in gait secondary to neuropathy.  Patient has good digital perfusion     Assessment:  Acute plantar fasciitis right with neuropathy-like symptoms bilateral     Plan:  H&P both conditions reviewed and discussed neuropathy and will start him on vitamin complex that he will get at [facility].  Injected the plantar fascia right 3 mg Kenalog 5 mg Xylocaine and applied fascial brace with instructions on supportive shoes and reappoint to recheck  X-rays indicate spur but were negative for signs of stress fracture or advanced arthritis

## 2017-03-10 NOTE — Patient Instructions (Signed)

## 2017-03-15 ENCOUNTER — Other Ambulatory Visit (HOSPITAL_COMMUNITY): Payer: Self-pay

## 2017-03-31 ENCOUNTER — Ambulatory Visit: Payer: Medicare Other | Admitting: Podiatry

## 2017-04-01 ENCOUNTER — Encounter: Payer: Self-pay | Admitting: Thoracic Surgery (Cardiothoracic Vascular Surgery)

## 2017-05-13 ENCOUNTER — Ambulatory Visit (INDEPENDENT_AMBULATORY_CARE_PROVIDER_SITE_OTHER): Payer: Medicare Other | Admitting: *Deleted

## 2017-05-13 DIAGNOSIS — I5022 Chronic systolic (congestive) heart failure: Secondary | ICD-10-CM

## 2017-05-13 LAB — CUP PACEART REMOTE DEVICE CHECK
Battery Remaining Longevity: 61 mo
Battery Voltage: 2.98 V
Brady Statistic AP VS Percent: 0.24 %
Brady Statistic RA Percent Paced: 42.59 %
Date Time Interrogation Session: 20190502041704
HighPow Impedance: 68 Ohm
Implantable Lead Implant Date: 20161205
Implantable Lead Implant Date: 20161205
Implantable Lead Model: 4598
Implantable Lead Model: 6935
Implantable Pulse Generator Implant Date: 20161205
Lead Channel Impedance Value: 342 Ohm
Lead Channel Impedance Value: 361 Ohm
Lead Channel Impedance Value: 418 Ohm
Lead Channel Impedance Value: 418 Ohm
Lead Channel Impedance Value: 418 Ohm
Lead Channel Impedance Value: 551 Ohm
Lead Channel Impedance Value: 608 Ohm
Lead Channel Impedance Value: 665 Ohm
Lead Channel Pacing Threshold Pulse Width: 0.4 ms
Lead Channel Sensing Intrinsic Amplitude: 16.25 mV
Lead Channel Sensing Intrinsic Amplitude: 16.25 mV
Lead Channel Setting Pacing Amplitude: 2 V
Lead Channel Setting Pacing Amplitude: 2 V
Lead Channel Setting Pacing Amplitude: 2.5 V
Lead Channel Setting Pacing Pulse Width: 0.4 ms
Lead Channel Setting Pacing Pulse Width: 0.4 ms
MDC IDC LEAD IMPLANT DT: 20161205
MDC IDC LEAD LOCATION: 753858
MDC IDC LEAD LOCATION: 753859
MDC IDC LEAD LOCATION: 753860
MDC IDC MSMT LEADCHNL LV IMPEDANCE VALUE: 361 Ohm
MDC IDC MSMT LEADCHNL LV IMPEDANCE VALUE: 361 Ohm
MDC IDC MSMT LEADCHNL LV IMPEDANCE VALUE: 665 Ohm
MDC IDC MSMT LEADCHNL LV IMPEDANCE VALUE: 665 Ohm
MDC IDC MSMT LEADCHNL LV PACING THRESHOLD AMPLITUDE: 1 V
MDC IDC MSMT LEADCHNL LV PACING THRESHOLD PULSEWIDTH: 0.4 ms
MDC IDC MSMT LEADCHNL RA IMPEDANCE VALUE: 418 Ohm
MDC IDC MSMT LEADCHNL RA PACING THRESHOLD AMPLITUDE: 0.625 V
MDC IDC MSMT LEADCHNL RA SENSING INTR AMPL: 2.25 mV
MDC IDC MSMT LEADCHNL RA SENSING INTR AMPL: 2.25 mV
MDC IDC MSMT LEADCHNL RV PACING THRESHOLD AMPLITUDE: 0.625 V
MDC IDC MSMT LEADCHNL RV PACING THRESHOLD PULSEWIDTH: 0.4 ms
MDC IDC SET LEADCHNL RV SENSING SENSITIVITY: 0.3 mV
MDC IDC STAT BRADY AP VP PERCENT: 44.81 %
MDC IDC STAT BRADY AS VP PERCENT: 53.27 %
MDC IDC STAT BRADY AS VS PERCENT: 1.68 %
MDC IDC STAT BRADY RV PERCENT PACED: 93.24 %

## 2017-05-13 NOTE — Progress Notes (Signed)
Remote ICD transmission.   

## 2017-05-18 ENCOUNTER — Encounter: Payer: Self-pay | Admitting: Cardiology

## 2017-06-16 ENCOUNTER — Telehealth: Payer: Self-pay | Admitting: Cardiology

## 2017-06-16 NOTE — Telephone Encounter (Signed)
This should really be done by primary care  Pamala Hayman [name] MD, [facility]

## 2017-06-16 NOTE — Telephone Encounter (Signed)
New message     Pt want to know if Dr [name] will call in a new presc for valacyclovir 500mg  to [facility] at [facility].  This is for "cold sores".  Please call and let him know.

## 2017-06-16 NOTE — Telephone Encounter (Signed)
Returned call to patient Dr.Jordan advised to have refilled with PCP.Stated he does not have a PCP.Advised I will ask Dr.Jordan if ok to refill.

## 2017-06-16 NOTE — Telephone Encounter (Signed)
Routed to MD

## 2017-06-17 MED ORDER — VALACYCLOVIR HCL 500 MG PO TABS: 500.0000 mg | ORAL_TABLET | Freq: Every day | ORAL | 0 refills | Status: DC | PRN

## 2017-06-17 NOTE — Telephone Encounter (Signed)
Returned call to patient Dr.Jordan's recommendation given.Refill sent to pharmacy.

## 2017-06-17 NOTE — Telephone Encounter (Signed)
OK to refill. He should really get a primary care doctor.  Adrean Heitz [name] MD, [facility]

## 2017-08-12 ENCOUNTER — Encounter: Payer: Self-pay | Admitting: Cardiology

## 2017-08-12 ENCOUNTER — Ambulatory Visit (INDEPENDENT_AMBULATORY_CARE_PROVIDER_SITE_OTHER): Payer: Medicare Other | Admitting: *Deleted

## 2017-08-12 DIAGNOSIS — I5022 Chronic systolic (congestive) heart failure: Secondary | ICD-10-CM | POA: Diagnosis not present

## 2017-08-12 NOTE — Progress Notes (Signed)
Remote ICD transmission.   

## 2017-09-09 LAB — CUP PACEART REMOTE DEVICE CHECK
Battery Remaining Longevity: 55 mo
Brady Statistic AP VS Percent: 0.27 %
Brady Statistic AS VP Percent: 50.14 %
Brady Statistic AS VS Percent: 1.62 %
Brady Statistic RV Percent Paced: 92.75 %
HighPow Impedance: 70 Ohm
Implantable Lead Implant Date: 20161205
Implantable Lead Location: 753859
Implantable Lead Location: 753860
Implantable Lead Model: 5076
Lead Channel Impedance Value: 304 Ohm
Lead Channel Impedance Value: 342 Ohm
Lead Channel Impedance Value: 361 Ohm
Lead Channel Impedance Value: 418 Ohm
Lead Channel Impedance Value: 418 Ohm
Lead Channel Impedance Value: 418 Ohm
Lead Channel Impedance Value: 418 Ohm
Lead Channel Impedance Value: 665 Ohm
Lead Channel Impedance Value: 665 Ohm
Lead Channel Impedance Value: 703 Ohm
Lead Channel Pacing Threshold Amplitude: 0.5 V
Lead Channel Pacing Threshold Amplitude: 0.625 V
Lead Channel Pacing Threshold Amplitude: 1 V
Lead Channel Pacing Threshold Pulse Width: 0.4 ms
Lead Channel Pacing Threshold Pulse Width: 0.4 ms
Lead Channel Sensing Intrinsic Amplitude: 1.625 mV
Lead Channel Sensing Intrinsic Amplitude: 1.625 mV
Lead Channel Setting Pacing Amplitude: 2 V
Lead Channel Setting Pacing Pulse Width: 0.4 ms
MDC IDC LEAD IMPLANT DT: 20161205
MDC IDC LEAD IMPLANT DT: 20161205
MDC IDC LEAD LOCATION: 753858
MDC IDC MSMT BATTERY VOLTAGE: 2.97 V
MDC IDC MSMT LEADCHNL LV IMPEDANCE VALUE: 551 Ohm
MDC IDC MSMT LEADCHNL LV IMPEDANCE VALUE: 589 Ohm
MDC IDC MSMT LEADCHNL LV PACING THRESHOLD PULSEWIDTH: 0.4 ms
MDC IDC MSMT LEADCHNL RV IMPEDANCE VALUE: 361 Ohm
MDC IDC MSMT LEADCHNL RV SENSING INTR AMPL: 19.875 mV
MDC IDC MSMT LEADCHNL RV SENSING INTR AMPL: 19.875 mV
MDC IDC PG IMPLANT DT: 20161205
MDC IDC SESS DTM: 20190801052503
MDC IDC SET LEADCHNL LV PACING AMPLITUDE: 2 V
MDC IDC SET LEADCHNL LV PACING PULSEWIDTH: 0.4 ms
MDC IDC SET LEADCHNL RV PACING AMPLITUDE: 2.5 V
MDC IDC SET LEADCHNL RV SENSING SENSITIVITY: 0.3 mV
MDC IDC STAT BRADY AP VP PERCENT: 47.97 %
MDC IDC STAT BRADY RA PERCENT PACED: 45.22 %

## 2017-09-29 ENCOUNTER — Other Ambulatory Visit: Payer: Self-pay | Admitting: Cardiology

## 2017-09-29 DIAGNOSIS — I350 Nonrheumatic aortic (valve) stenosis: Secondary | ICD-10-CM

## 2017-10-05 ENCOUNTER — Other Ambulatory Visit: Payer: Self-pay

## 2017-10-05 MED ORDER — NITROGLYCERIN 0.4 MG SL SUBL: SUBLINGUAL_TABLET | SUBLINGUAL | 2 refills | Status: DC

## 2017-10-15 ENCOUNTER — Other Ambulatory Visit: Payer: Self-pay | Admitting: Cardiology

## 2017-10-20 ENCOUNTER — Ambulatory Visit (INDEPENDENT_AMBULATORY_CARE_PROVIDER_SITE_OTHER): Payer: Medicare Other | Admitting: Cardiology

## 2017-10-20 ENCOUNTER — Encounter: Payer: Self-pay | Admitting: Cardiology

## 2017-10-20 VITALS — BP 142/78 | HR 64 | Ht 70.0 in | Wt 223.6 lb

## 2017-10-20 DIAGNOSIS — Z8679 Personal history of other diseases of the circulatory system: Secondary | ICD-10-CM

## 2017-10-20 DIAGNOSIS — Z79899 Other long term (current) drug therapy: Secondary | ICD-10-CM

## 2017-10-20 MED ORDER — ISOSORBIDE MONONITRATE ER 60 MG PO TB24: 60.0000 mg | ORAL_TABLET | Freq: Every day | ORAL | 3 refills | Status: DC

## 2017-10-20 NOTE — Patient Instructions (Signed)
Medication Instructions:  Continue current medications  If you need a refill on your cardiac medications before your next appointment, please call your pharmacy.  Labwork: CBC and BMP HERE IN OUR OFFICE AT [facility]  Take the provided lab slips with you to the lab for your blood draw.    If you have labs (blood work) drawn today and your tests are completely normal, you will receive your results only by: Marland Kitchen MyChart Message (if you have MyChart) OR . A paper copy in the mail If you have any lab test that is abnormal or we need to change your treatment, we will call you to review the results.  Testing/Procedures: None Ordered   Follow-Up: At [facility], you and your health needs are our priority.  As part of our continuing mission to provide you with exceptional heart care, we have created designated Provider Care Teams.  These Care Teams include your primary Cardiologist (physician) and Advanced Practice Providers (APPs -  Physician Assistants and Nurse Practitioners) who all work together to provide you with the care you need, when you need it. . You will need a follow up appointment in 6 Months.  Please call our office 2 months in advance([phone]) to schedule this appointment.  You may see  DR [name] or one of the following Advanced Practice Providers on your designated Care Team:   . Almyra Deforest . Fabian Sharp    Thank you for choosing [facility]!!

## 2017-10-20 NOTE — Progress Notes (Signed)
10/20/2017 [name]   Sep 03, 1945  [id number]  Primary Physician Janeann Merl, MD Primary Cardiologist: Dr [name]  HPI:  Pleasant 72 y/o male followed by Dr [name] with a history of CABG in 1996, SVG-OM DES in March 2016. In Feb 2018 he underwent TAVR. He had a BiV ICD placed and his EF improved to 40-45%. He is in the office today for routine follow up and to get his medications refilled. He has know Barrett's esophagus. He says he takes NTG SL 2-3 times a week for this. He walks 30 minutes a day and does yard work without chest pain or Dyspnea.   Current Outpatient Medications  Medication Sig Dispense Refill  . amoxicillin (AMOXIL) 500 MG tablet Take 2g (four 500 mg tablets) one hour prior to dental procedures. 4 tablet 3  . Ascorbic Acid (VITAMIN C) 1000 MG tablet Take 1,000 mg by mouth daily.    Marland Kitchen aspirin EC 81 MG EC tablet Take 1 tablet (81 mg total) by mouth daily.    Marland Kitchen atorvastatin (LIPITOR) 80 MG tablet TAKE ONE TABLET BY MOUTH DAILY 90 tablet 1  . Cholecalciferol (VITAMIN D-3) 5000 units TABS Take 1 tablet by mouth daily.    . finasteride (PROSCAR) 5 MG tablet Take 5 mg by mouth daily.    . metoprolol succinate (TOPROL-XL) 50 MG 24 hr tablet Take 1 tablet (50 mg total) by mouth daily. Take with or immediately following a meal. 90 tablet 3  . nitroGLYCERIN (NITROSTAT) 0.4 MG SL tablet DISSOLVE 1 TABLET UNDER TONGUE FOR CHEST PAIN -MAY REPEAT IF PAIN REMAINS AFTER 5 MIN, CALL 911 AND REPEAT DOSE. MAX 3 TABLETS IN 15 MINUTES 100 tablet 2  . oxybutynin (DITROPAN) 5 MG tablet Take 5 mg by mouth daily.     . pantoprazole (PROTONIX) 40 MG tablet Take 1 tablet (40 mg total) by mouth daily. 90 tablet 3  . ramipril (ALTACE) 5 MG capsule Take 1 capsule (5 mg total) by mouth daily. 90 capsule 3  . valACYclovir (VALTREX) 500 MG tablet Take 1 tablet (500 mg total) by mouth daily as needed (outbreak). 30 tablet 0  . isosorbide mononitrate (IMDUR) 60 MG 24 hr tablet Take 1 tablet (60 mg  total) by mouth daily. 90 tablet 3   No current facility-administered medications for this visit.     Allergies  Allergen Reactions  . No Known Allergies     Past Medical History:  Diagnosis Date  . Aortic stenosis   . Barrett's esophageal ulceration   . Chronic systolic CHF (congestive heart failure) ([place])   . Coronary artery disease    a. CABG 1996: LIMA graft to the LAD and diagonal, sequential vein graft to the acute marginal, PDA, and posterior lateral branches of the right coronary, sequential saphenous vein graft to the obtuse marginal vessel and distal circumflex. b. Cath 2007 patent grafts. c. s/p DES to SVG-OM2 in 03/2014.  Marland Kitchen Coronary artery disease involving autologous vein coronary bypass graft with angina pectoris ([place]) 03/13/2014   PCI using DES in SVG to LCx system  . Dental caries    pre-heart valve surgery protocol  . Gastroesophageal reflux disease   . History of erectile dysfunction   . History of hiatal hernia   . History of kidney stones   . History of obesity   . History of prostatitis   . Hypercholesterolemia   . Hypertension   . Kidney stones   . LBBB (left bundle branch block)   .  Nodule of kidney 02/04/2016   Incidental 33mm nodule upper pole right kidney noted on CT angiogram - MRI with and without gadolinium contrast recommened in 6 months  . OSA (obstructive sleep apnea)    "suppose to wear mask; I don't" (03/13/2014)  . PVC's (premature ventricular contractions)   . S/P CABG x 7 01/07/1995   LIMA to LAD-diagonal, SVG to OM-LCx, SVG to AM-PD-RPL  . S/P TAVR (transcatheter aortic valve replacement) 02/18/2016   26 mm Edwards Sapien 3 transcatheter heart valve placed via percutaneous right transfemoral approach    Social History   Socioeconomic History  . Marital status: Married    Spouse name: Not on file  . Number of children: 3  . Years of education: Not on file  . Highest education level: Not on file  Occupational History  . 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 Financial resource strain: Not on file  . Food insecurity:    Worry: Not on file    Inability: Not on file  . Transportation needs:    Medical: Not on file    Non-medical: Not on file  Tobacco Use  . Smoking status: Former Smoker    Packs/day: 0.75    Years: 15.00    Pack years: 11.25    Types: Cigarettes    Last attempt to quit: 01/12/1997    Years since quitting: 20.7  . Smokeless tobacco: Never Used  . Tobacco comment: 3/4 pack per day. smoked 15 years off and on  Substance and Sexual Activity  . Alcohol use: No    Alcohol/week: 4.0 standard drinks    Types: 4 Cans of beer per week  . Drug use: No  . Sexual activity: Yes  Lifestyle  . Physical activity:    Days per week: Not on file    Minutes per session: Not on file  . Stress: Not on file  Relationships  . Social connections:    Talks on phone: Not on file    Gets together: Not on file    Attends religious service: Not on file    Active member of club or organization: Not on file    Attends meetings of clubs or organizations: Not on file    Relationship status: Not on file  . Intimate partner violence:    Fear of current or ex partner: Not on file    Emotionally abused: Not on file    Physically abused: Not on file    Forced sexual activity: Not on file  Other Topics Concern  . Not on file  Social History Narrative  . Not on file     Family History  Problem Relation Age of Onset  . Heart attack Father   . Cancer Sister        Uterine Cancer     Review of Systems: General: negative for chills, fever, night sweats or weight changes.  Cardiovascular: negative for chest pain, dyspnea on exertion, edema, orthopnea, palpitations, paroxysmal nocturnal dyspnea or shortness of breath Dermatological: negative for rash Respiratory: negative for cough or wheezing Urologic: negative for hematuria Abdominal: negative for nausea, vomiting, diarrhea,  bright red blood per rectum, melena, or hematemesis Neurologic: negative for visual changes, syncope, or dizziness All other systems reviewed and are otherwise negative except as noted above.    Blood pressure (!) 142/78, pulse 64, height 5\' 10"  (1.778 m), weight 223 lb 9.6 oz (101.4 kg), SpO2 95 %.  General appearance: alert, cooperative and no distress Neck: no carotid bruit and no JVD Lungs: clear to auscultation bilaterally Heart: irregularly irregular rhythm Extremities: no edema Skin: warm and dry Neurologic: Grossly normal  EKG Paced  ASSESSMENT AND PLAN:  1. Coronary disease with remote coronary bypass surgery in 1996. Cath in March 2016 showed high grade disease in SVG to OM. s/p successful PCI with DES. I'm not sure what to make of his NTG SL use, I suggested for now he stay on the Isosorbide.  2.  Severe aortic stenosis. S/p TAVR with #23 Berniece Pap valve Feb 2018. Echo Feb 2019 showed good valve function.  3. Chronic systolic CHF. Last EF 40-45%. Asymptomatic. On ACEi and Toprol XL.  Continue Rx.   4. Syncope-  Now s/p BiV ICD.   5. Obesity. BMI 32  6. Hyperlipidemia. On lipitor.   7. LBBB   PLAN  Refill isosorbide. Check labs (no PCP). F/U 6 months with Dr [name].   Kerin Ransom PA-C 10/20/2017 2:38 PM

## 2017-10-21 LAB — BASIC METABOLIC PANEL
BUN/Creatinine Ratio: 24 (ref 10–24)
BUN: 23 mg/dL (ref 8–27)
CO2: 24 mmol/L (ref 20–29)
Calcium: 9.5 mg/dL (ref 8.6–10.2)
Chloride: 105 mmol/L (ref 96–106)
Creatinine, Ser: 0.97 mg/dL (ref 0.76–1.27)
GFR calc Af Amer: 89 mL/min/{1.73_m2} (ref >59)
GFR calc non Af Amer: 77 mL/min/{1.73_m2} (ref >59)
Glucose: 115 mg/dL — ABNORMAL HIGH (ref 65–99)
Potassium: 3.9 mmol/L (ref 3.5–5.2)
Sodium: 144 mmol/L (ref 134–144)

## 2017-10-21 LAB — CBC
Hematocrit: 41.8 % (ref 37.5–51.0)
Hemoglobin: 14.2 g/dL (ref 13.0–17.7)
MCH: 30.5 pg (ref 26.6–33.0)
MCHC: 34 g/dL (ref 31.5–35.7)
MCV: 90 fL (ref 79–97)
Platelets: 175 10*3/uL (ref 150–450)
RBC: 4.66 x10E6/uL (ref 4.14–5.80)
RDW: 12.8 % (ref 12.3–15.4)
WBC: 5.3 10*3/uL (ref 3.4–10.8)

## 2017-10-21 NOTE — Addendum Note (Signed)
Addended by: Diana Eves on: 10/21/2017 01:54 PM   Modules accepted: Orders

## 2017-11-10 ENCOUNTER — Other Ambulatory Visit: Payer: Self-pay | Admitting: *Deleted

## 2017-11-11 ENCOUNTER — Ambulatory Visit (INDEPENDENT_AMBULATORY_CARE_PROVIDER_SITE_OTHER): Payer: Medicare Other | Admitting: *Deleted

## 2017-11-11 DIAGNOSIS — I5022 Chronic systolic (congestive) heart failure: Secondary | ICD-10-CM

## 2017-11-11 DIAGNOSIS — I447 Left bundle-branch block, unspecified: Secondary | ICD-10-CM

## 2017-11-11 NOTE — Progress Notes (Signed)
Remote ICD transmission.   

## 2017-11-19 ENCOUNTER — Encounter: Payer: Self-pay | Admitting: Cardiology

## 2017-11-25 ENCOUNTER — Other Ambulatory Visit: Payer: Self-pay | Admitting: Cardiology

## 2017-11-25 DIAGNOSIS — I350 Nonrheumatic aortic (valve) stenosis: Secondary | ICD-10-CM

## 2017-12-02 ENCOUNTER — Other Ambulatory Visit: Payer: Self-pay | Admitting: Cardiology

## 2017-12-03 NOTE — Telephone Encounter (Signed)
OK to refill ? ?Roise Emert MD, FACC ? ?

## 2017-12-22 ENCOUNTER — Other Ambulatory Visit: Payer: Self-pay | Admitting: Cardiology

## 2017-12-22 DIAGNOSIS — I5022 Chronic systolic (congestive) heart failure: Secondary | ICD-10-CM

## 2017-12-23 ENCOUNTER — Other Ambulatory Visit: Payer: Self-pay | Admitting: Cardiology

## 2018-01-03 ENCOUNTER — Other Ambulatory Visit: Payer: Self-pay | Admitting: Cardiology

## 2018-01-03 DIAGNOSIS — I350 Nonrheumatic aortic (valve) stenosis: Secondary | ICD-10-CM

## 2018-01-03 DIAGNOSIS — Z952 Presence of prosthetic heart valve: Secondary | ICD-10-CM

## 2018-01-11 LAB — CUP PACEART REMOTE DEVICE CHECK
Battery Remaining Longevity: 48 mo
Battery Voltage: 2.97 V
Brady Statistic AP VS Percent: 0.04 %
Brady Statistic AS VP Percent: 47.04 %
Brady Statistic AS VS Percent: 0.18 %
Brady Statistic RA Percent Paced: 52.23 %
Brady Statistic RV Percent Paced: 98.72 %
HighPow Impedance: 69 Ohm
Implantable Lead Implant Date: 20161205
Implantable Lead Implant Date: 20161205
Implantable Lead Implant Date: 20161205
Implantable Lead Location: 753858
Implantable Lead Location: 753860
Implantable Lead Model: 4598
Implantable Lead Model: 5076
Implantable Lead Model: 6935
Lead Channel Impedance Value: 342 Ohm
Lead Channel Impedance Value: 342 Ohm
Lead Channel Impedance Value: 361 Ohm
Lead Channel Impedance Value: 399 Ohm
Lead Channel Impedance Value: 418 Ohm
Lead Channel Impedance Value: 418 Ohm
Lead Channel Impedance Value: 418 Ohm
Lead Channel Impedance Value: 551 Ohm
Lead Channel Impedance Value: 608 Ohm
Lead Channel Impedance Value: 646 Ohm
Lead Channel Impedance Value: 665 Ohm
Lead Channel Impedance Value: 665 Ohm
Lead Channel Pacing Threshold Amplitude: 0.625 V
Lead Channel Pacing Threshold Amplitude: 0.625 V
Lead Channel Pacing Threshold Amplitude: 0.875 V
Lead Channel Pacing Threshold Pulse Width: 0.4 ms
Lead Channel Pacing Threshold Pulse Width: 0.4 ms
Lead Channel Pacing Threshold Pulse Width: 0.4 ms
Lead Channel Sensing Intrinsic Amplitude: 1.125 mV
Lead Channel Sensing Intrinsic Amplitude: 1.125 mV
Lead Channel Sensing Intrinsic Amplitude: 16 mV
Lead Channel Sensing Intrinsic Amplitude: 16 mV
Lead Channel Setting Pacing Amplitude: 2 V
Lead Channel Setting Pacing Amplitude: 2 V
Lead Channel Setting Pacing Pulse Width: 0.4 ms
Lead Channel Setting Pacing Pulse Width: 0.4 ms
Lead Channel Setting Sensing Sensitivity: 0.3 mV
MDC IDC LEAD LOCATION: 753859
MDC IDC MSMT LEADCHNL RV IMPEDANCE VALUE: 361 Ohm
MDC IDC PG IMPLANT DT: 20161205
MDC IDC SESS DTM: 20191031062704
MDC IDC SET LEADCHNL RV PACING AMPLITUDE: 2.5 V
MDC IDC STAT BRADY AP VP PERCENT: 52.74 %

## 2018-02-10 ENCOUNTER — Ambulatory Visit (INDEPENDENT_AMBULATORY_CARE_PROVIDER_SITE_OTHER): Payer: Medicare HMO

## 2018-02-10 DIAGNOSIS — I5022 Chronic systolic (congestive) heart failure: Secondary | ICD-10-CM | POA: Diagnosis not present

## 2018-02-11 LAB — CUP PACEART REMOTE DEVICE CHECK
Battery Remaining Longevity: 45 mo
Battery Voltage: 2.96 V
Brady Statistic AP VP Percent: 44.83 %
Brady Statistic AP VS Percent: 0.02 %
Brady Statistic AS VP Percent: 54.99 %
Brady Statistic AS VS Percent: 0.15 %
Brady Statistic RA Percent Paced: 44.67 %
Brady Statistic RV Percent Paced: 99.45 %
Date Time Interrogation Session: 20200130072603
HighPow Impedance: 70 Ohm
Implantable Lead Implant Date: 20161205
Implantable Lead Implant Date: 20161205
Implantable Lead Location: 753858
Implantable Lead Location: 753859
Implantable Lead Location: 753860
Implantable Lead Model: 4598
Implantable Lead Model: 5076
Implantable Lead Model: 6935
Implantable Pulse Generator Implant Date: 20161205
Lead Channel Impedance Value: 342 Ohm
Lead Channel Impedance Value: 361 Ohm
Lead Channel Impedance Value: 361 Ohm
Lead Channel Impedance Value: 418 Ohm
Lead Channel Impedance Value: 418 Ohm
Lead Channel Impedance Value: 418 Ohm
Lead Channel Impedance Value: 456 Ohm
Lead Channel Impedance Value: 551 Ohm
Lead Channel Impedance Value: 608 Ohm
Lead Channel Impedance Value: 646 Ohm
Lead Channel Impedance Value: 703 Ohm
Lead Channel Pacing Threshold Amplitude: 0.625 V
Lead Channel Pacing Threshold Amplitude: 0.625 V
Lead Channel Pacing Threshold Amplitude: 0.75 V
Lead Channel Pacing Threshold Pulse Width: 0.4 ms
Lead Channel Pacing Threshold Pulse Width: 0.4 ms
Lead Channel Pacing Threshold Pulse Width: 0.4 ms
Lead Channel Sensing Intrinsic Amplitude: 16 mV
Lead Channel Sensing Intrinsic Amplitude: 16 mV
Lead Channel Sensing Intrinsic Amplitude: 2.5 mV
Lead Channel Sensing Intrinsic Amplitude: 2.5 mV
Lead Channel Setting Pacing Amplitude: 1.75 V
Lead Channel Setting Pacing Amplitude: 2 V
Lead Channel Setting Pacing Amplitude: 2.5 V
Lead Channel Setting Pacing Pulse Width: 0.4 ms
Lead Channel Setting Pacing Pulse Width: 0.4 ms
Lead Channel Setting Sensing Sensitivity: 0.3 mV
MDC IDC LEAD IMPLANT DT: 20161205
MDC IDC MSMT LEADCHNL LV IMPEDANCE VALUE: 342 Ohm
MDC IDC MSMT LEADCHNL LV IMPEDANCE VALUE: 646 Ohm

## 2018-02-16 DIAGNOSIS — R972 Elevated prostate specific antigen [PSA]: Secondary | ICD-10-CM | POA: Diagnosis not present

## 2018-02-16 DIAGNOSIS — R351 Nocturia: Secondary | ICD-10-CM | POA: Diagnosis not present

## 2018-02-16 DIAGNOSIS — N401 Enlarged prostate with lower urinary tract symptoms: Secondary | ICD-10-CM | POA: Diagnosis not present

## 2018-02-18 NOTE — Progress Notes (Signed)
Remote ICD transmission.   

## 2018-02-21 ENCOUNTER — Encounter: Payer: Self-pay | Admitting: Cardiology

## 2018-04-03 ENCOUNTER — Other Ambulatory Visit: Payer: Self-pay | Admitting: Cardiology

## 2018-04-03 DIAGNOSIS — I350 Nonrheumatic aortic (valve) stenosis: Secondary | ICD-10-CM

## 2018-04-05 ENCOUNTER — Other Ambulatory Visit: Payer: Self-pay | Admitting: Cardiology

## 2018-04-05 DIAGNOSIS — I350 Nonrheumatic aortic (valve) stenosis: Secondary | ICD-10-CM

## 2018-04-05 NOTE — Telephone Encounter (Signed)
°*  STAT* If patient is at the pharmacy, call can be transferred to refill team.   1. Which medications need to be refilled? (please list name of each medication and dose if known) Atorvastatin  2. Which pharmacy/location (including street and city if local pharmacy) is medication to be sent to? [name] [place], [place]  3. Do they need a 30 day or 90 day supply? 90 and refills

## 2018-04-06 MED ORDER — ATORVASTATIN CALCIUM 80 MG PO TABS: 80.0000 mg | ORAL_TABLET | Freq: Every day | ORAL | 1 refills | Status: DC

## 2018-05-12 ENCOUNTER — Ambulatory Visit (INDEPENDENT_AMBULATORY_CARE_PROVIDER_SITE_OTHER): Payer: Medicare HMO | Admitting: *Deleted

## 2018-05-12 ENCOUNTER — Other Ambulatory Visit: Payer: Self-pay

## 2018-05-12 DIAGNOSIS — I5022 Chronic systolic (congestive) heart failure: Secondary | ICD-10-CM | POA: Diagnosis not present

## 2018-05-12 DIAGNOSIS — I447 Left bundle-branch block, unspecified: Secondary | ICD-10-CM

## 2018-05-12 LAB — CUP PACEART REMOTE DEVICE CHECK
Battery Remaining Longevity: 37 mo
Battery Voltage: 2.96 V
Brady Statistic AP VP Percent: 68.78 %
Brady Statistic AP VS Percent: 0.04 %
Brady Statistic AS VP Percent: 30.85 %
Brady Statistic AS VS Percent: 0.33 %
Brady Statistic RA Percent Paced: 67.29 %
Brady Statistic RV Percent Paced: 97.53 %
Date Time Interrogation Session: 20200430083824
HighPow Impedance: 67 Ohm
Implantable Lead Implant Date: 20161205
Implantable Lead Implant Date: 20161205
Implantable Lead Implant Date: 20161205
Implantable Lead Location: 753858
Implantable Lead Location: 753859
Implantable Lead Location: 753860
Implantable Lead Model: 4598
Implantable Lead Model: 5076
Implantable Lead Model: 6935
Implantable Pulse Generator Implant Date: 20161205
Lead Channel Impedance Value: 304 Ohm
Lead Channel Impedance Value: 342 Ohm
Lead Channel Impedance Value: 361 Ohm
Lead Channel Impedance Value: 399 Ohm
Lead Channel Impedance Value: 418 Ohm
Lead Channel Impedance Value: 418 Ohm
Lead Channel Impedance Value: 418 Ohm
Lead Channel Impedance Value: 418 Ohm
Lead Channel Impedance Value: 551 Ohm
Lead Channel Impedance Value: 589 Ohm
Lead Channel Impedance Value: 646 Ohm
Lead Channel Impedance Value: 665 Ohm
Lead Channel Impedance Value: 665 Ohm
Lead Channel Pacing Threshold Amplitude: 0.5 V
Lead Channel Pacing Threshold Amplitude: 0.625 V
Lead Channel Pacing Threshold Amplitude: 0.875 V
Lead Channel Pacing Threshold Pulse Width: 0.4 ms
Lead Channel Pacing Threshold Pulse Width: 0.4 ms
Lead Channel Pacing Threshold Pulse Width: 0.4 ms
Lead Channel Sensing Intrinsic Amplitude: 1.25 mV
Lead Channel Sensing Intrinsic Amplitude: 12.875 mV
Lead Channel Setting Pacing Amplitude: 2 V
Lead Channel Setting Pacing Amplitude: 2 V
Lead Channel Setting Pacing Amplitude: 2.5 V
Lead Channel Setting Pacing Pulse Width: 0.4 ms
Lead Channel Setting Pacing Pulse Width: 0.4 ms
Lead Channel Setting Sensing Sensitivity: 0.3 mV

## 2018-05-23 NOTE — Progress Notes (Signed)
Remote ICD transmission.   

## 2018-06-21 ENCOUNTER — Other Ambulatory Visit: Payer: Self-pay | Admitting: Cardiology

## 2018-06-30 ENCOUNTER — Other Ambulatory Visit: Payer: Self-pay | Admitting: Cardiology

## 2018-07-13 DIAGNOSIS — H43819 Vitreous degeneration, unspecified eye: Secondary | ICD-10-CM | POA: Diagnosis not present

## 2018-07-13 DIAGNOSIS — H524 Presbyopia: Secondary | ICD-10-CM | POA: Diagnosis not present

## 2018-08-11 ENCOUNTER — Ambulatory Visit (INDEPENDENT_AMBULATORY_CARE_PROVIDER_SITE_OTHER): Payer: Medicare HMO | Admitting: *Deleted

## 2018-08-11 DIAGNOSIS — I5022 Chronic systolic (congestive) heart failure: Secondary | ICD-10-CM | POA: Diagnosis not present

## 2018-08-11 LAB — CUP PACEART REMOTE DEVICE CHECK
Battery Remaining Longevity: 37 mo
Battery Voltage: 2.95 V
Brady Statistic AP VP Percent: 55.27 %
Brady Statistic AP VS Percent: 0.08 %
Brady Statistic AS VP Percent: 43.54 %
Brady Statistic AS VS Percent: 1.11 %
Brady Statistic RA Percent Paced: 52.62 %
Brady Statistic RV Percent Paced: 94.5 %
Date Time Interrogation Session: 20200730083722
HighPow Impedance: 74 Ohm
Implantable Lead Implant Date: 20161205
Implantable Lead Implant Date: 20161205
Implantable Lead Implant Date: 20161205
Implantable Lead Location: 753858
Implantable Lead Location: 753859
Implantable Lead Location: 753860
Implantable Lead Model: 4598
Implantable Lead Model: 5076
Implantable Lead Model: 6935
Implantable Pulse Generator Implant Date: 20161205
Lead Channel Impedance Value: 304 Ohm
Lead Channel Impedance Value: 361 Ohm
Lead Channel Impedance Value: 361 Ohm
Lead Channel Impedance Value: 361 Ohm
Lead Channel Impedance Value: 418 Ohm
Lead Channel Impedance Value: 418 Ohm
Lead Channel Impedance Value: 418 Ohm
Lead Channel Impedance Value: 456 Ohm
Lead Channel Impedance Value: 551 Ohm
Lead Channel Impedance Value: 589 Ohm
Lead Channel Impedance Value: 665 Ohm
Lead Channel Impedance Value: 665 Ohm
Lead Channel Impedance Value: 722 Ohm
Lead Channel Pacing Threshold Amplitude: 0.5 V
Lead Channel Pacing Threshold Amplitude: 0.5 V
Lead Channel Pacing Threshold Amplitude: 1 V
Lead Channel Pacing Threshold Pulse Width: 0.4 ms
Lead Channel Pacing Threshold Pulse Width: 0.4 ms
Lead Channel Pacing Threshold Pulse Width: 0.4 ms
Lead Channel Sensing Intrinsic Amplitude: 1.25 mV
Lead Channel Sensing Intrinsic Amplitude: 12.875 mV
Lead Channel Setting Pacing Amplitude: 2 V
Lead Channel Setting Pacing Amplitude: 2 V
Lead Channel Setting Pacing Amplitude: 2.5 V
Lead Channel Setting Pacing Pulse Width: 0.4 ms
Lead Channel Setting Pacing Pulse Width: 0.4 ms
Lead Channel Setting Sensing Sensitivity: 0.3 mV

## 2018-08-12 ENCOUNTER — Other Ambulatory Visit: Payer: Self-pay | Admitting: Cardiology

## 2018-08-12 DIAGNOSIS — I350 Nonrheumatic aortic (valve) stenosis: Secondary | ICD-10-CM

## 2018-08-12 NOTE — Telephone Encounter (Signed)
Rx request sent to pharmacy.  

## 2018-08-19 ENCOUNTER — Encounter: Payer: Self-pay | Admitting: Cardiology

## 2018-08-19 NOTE — Progress Notes (Signed)
Remote ICD transmission.   

## 2018-08-30 IMAGING — CR DG CHEST 2V
2 series · 2 of 2 positions shown · non-contrast
Comparison: 12/18/2014

CLINICAL DATA: Preop cardiac catheterization.  Hypertension.

EXAM:
CHEST  2 VIEW

[w chest pa]
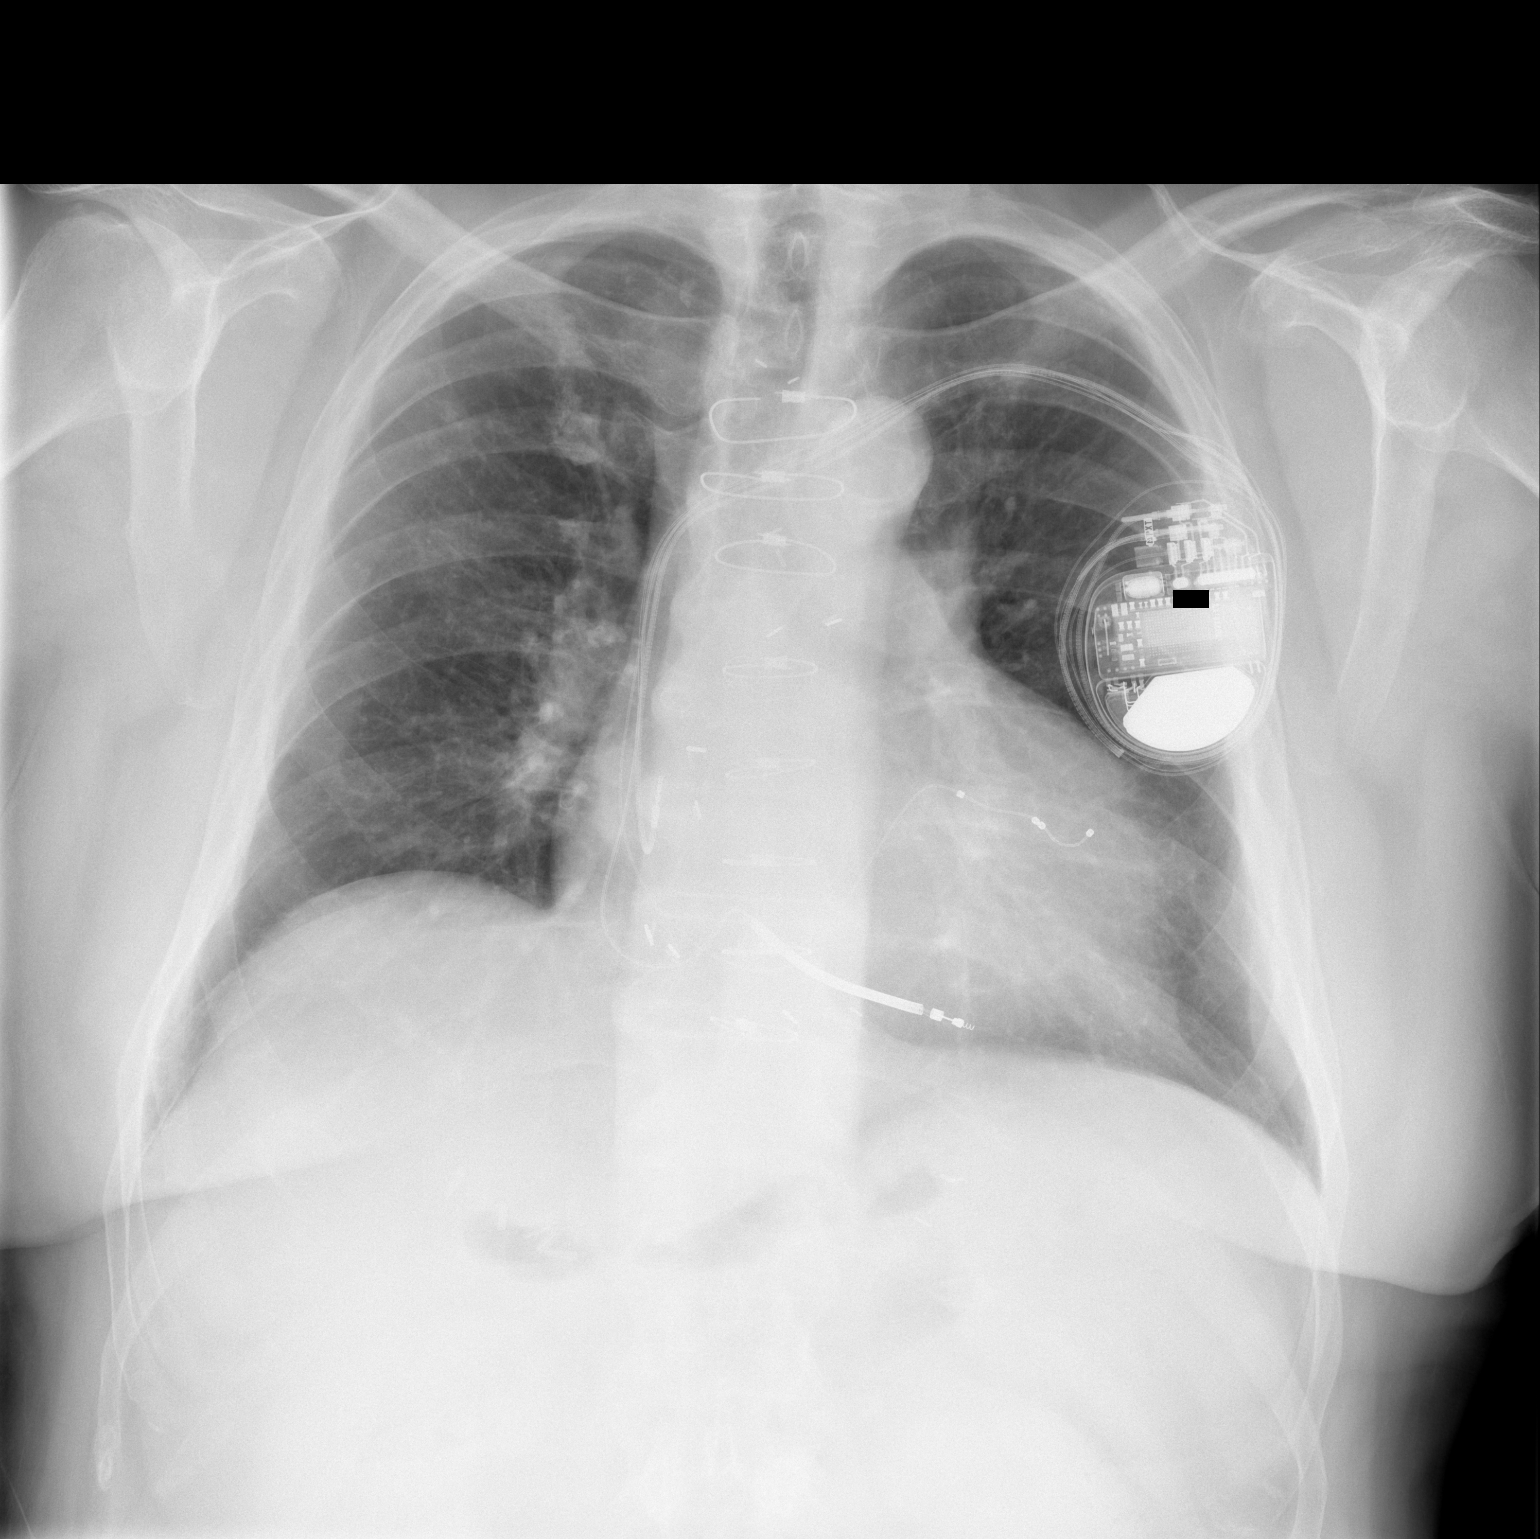

[w chest lat]
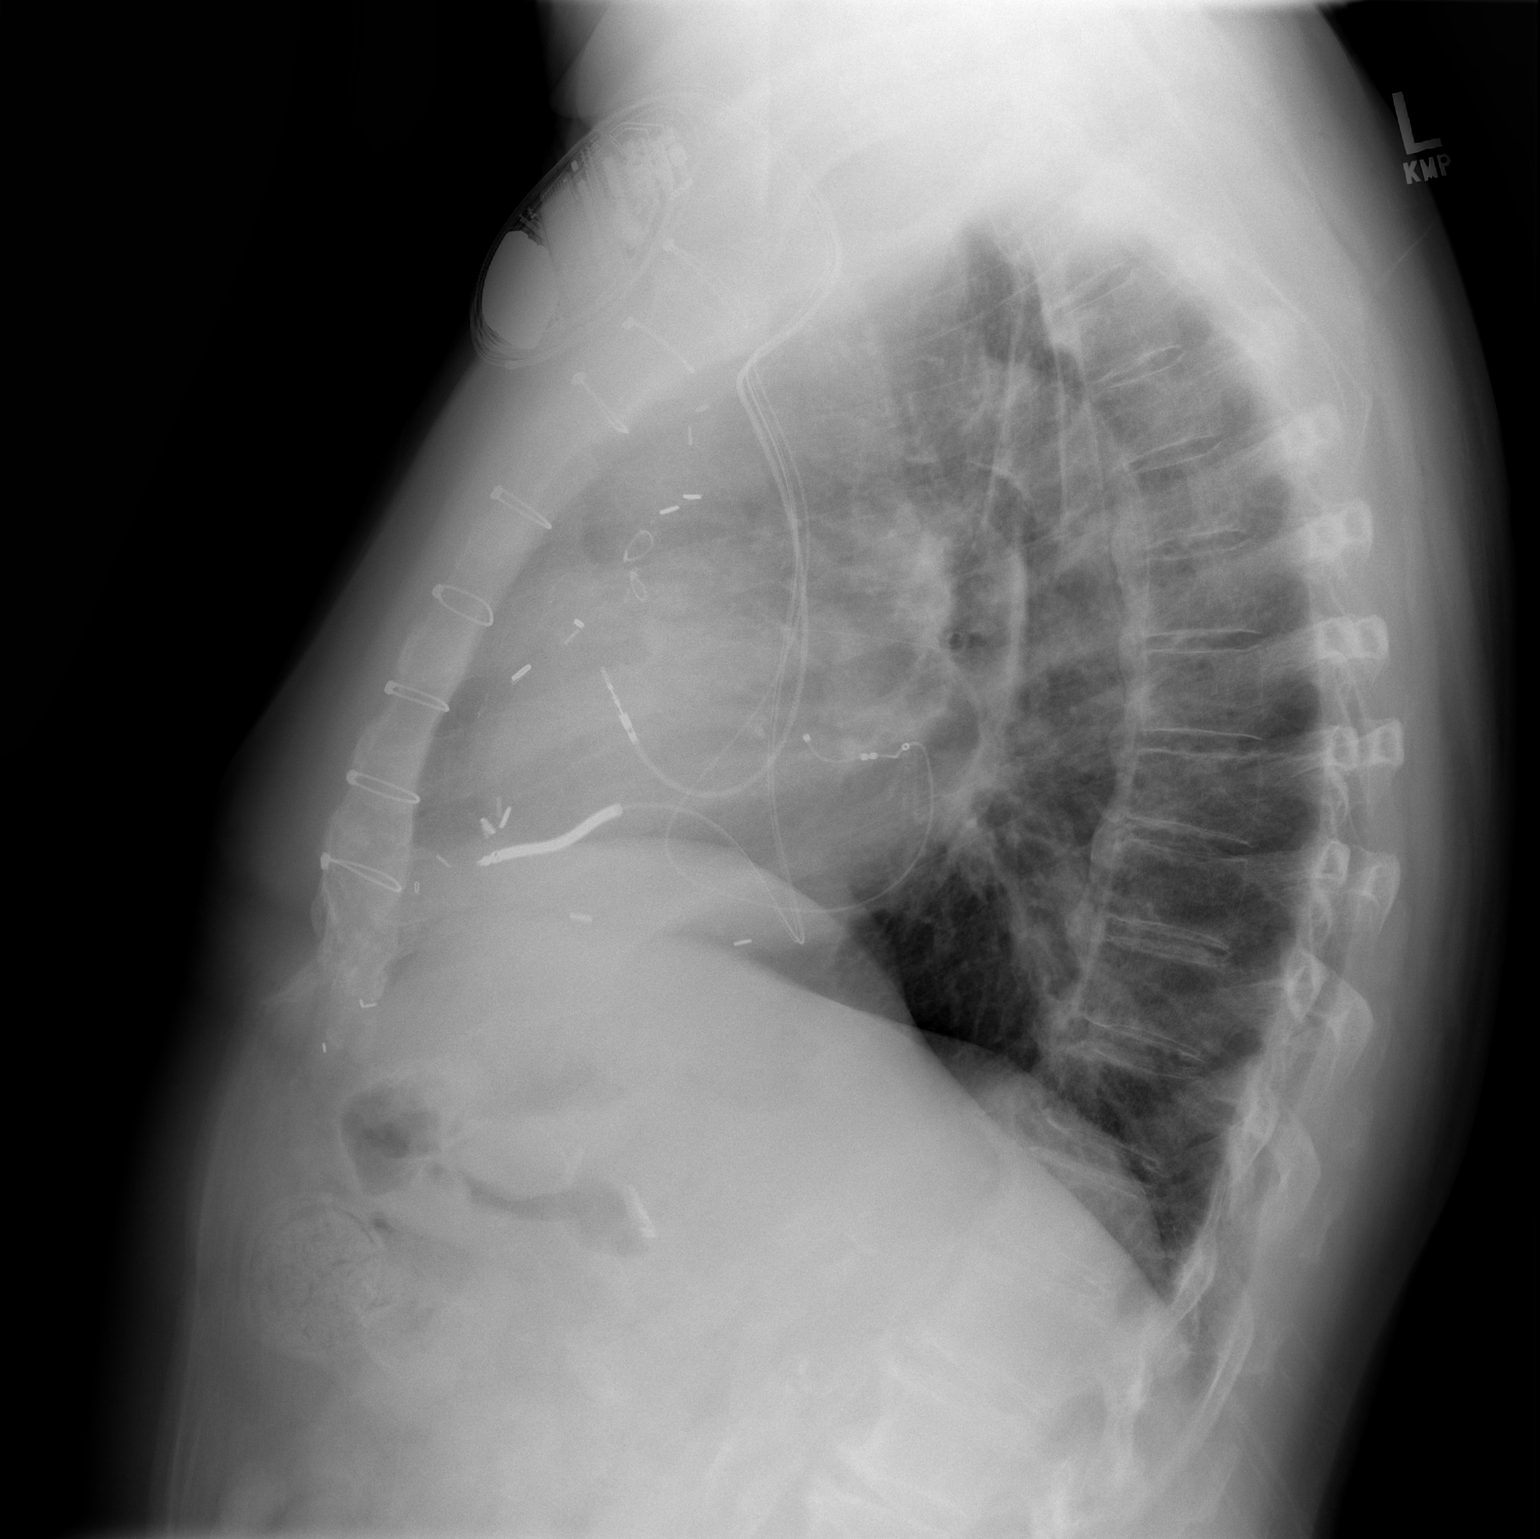

[2 of 2 positions shown; findings below may reference images not displayed]

FINDINGS: Left AICD remains in place, unchanged. Heart is normal size. No
confluent opacities or effusions. No acute bony abnormality.
IMPRESSION: No active cardiopulmonary disease.

## 2018-09-09 DIAGNOSIS — N200 Calculus of kidney: Secondary | ICD-10-CM | POA: Diagnosis not present

## 2018-09-09 DIAGNOSIS — R1084 Generalized abdominal pain: Secondary | ICD-10-CM | POA: Diagnosis not present

## 2018-09-09 DIAGNOSIS — N401 Enlarged prostate with lower urinary tract symptoms: Secondary | ICD-10-CM | POA: Diagnosis not present

## 2018-09-09 DIAGNOSIS — R351 Nocturia: Secondary | ICD-10-CM | POA: Diagnosis not present

## 2018-09-21 DIAGNOSIS — N401 Enlarged prostate with lower urinary tract symptoms: Secondary | ICD-10-CM | POA: Diagnosis not present

## 2018-09-21 DIAGNOSIS — R1084 Generalized abdominal pain: Secondary | ICD-10-CM | POA: Diagnosis not present

## 2018-09-21 DIAGNOSIS — Z87442 Personal history of urinary calculi: Secondary | ICD-10-CM | POA: Diagnosis not present

## 2018-09-21 DIAGNOSIS — R351 Nocturia: Secondary | ICD-10-CM | POA: Diagnosis not present

## 2018-09-24 ENCOUNTER — Other Ambulatory Visit: Payer: Self-pay | Admitting: Cardiology

## 2018-09-24 DIAGNOSIS — I350 Nonrheumatic aortic (valve) stenosis: Secondary | ICD-10-CM

## 2018-09-24 DIAGNOSIS — Z952 Presence of prosthetic heart valve: Secondary | ICD-10-CM

## 2018-09-29 IMAGING — CR DG CHEST 2V
2 series · 2 of 2 positions shown · non-contrast
Comparison: CT of 02/03/2016

CLINICAL DATA: Aortic stenosis. Preadmission for TAVR. CABG x7.
CHF.

EXAM:
CHEST  2 VIEW

[w chest pa]
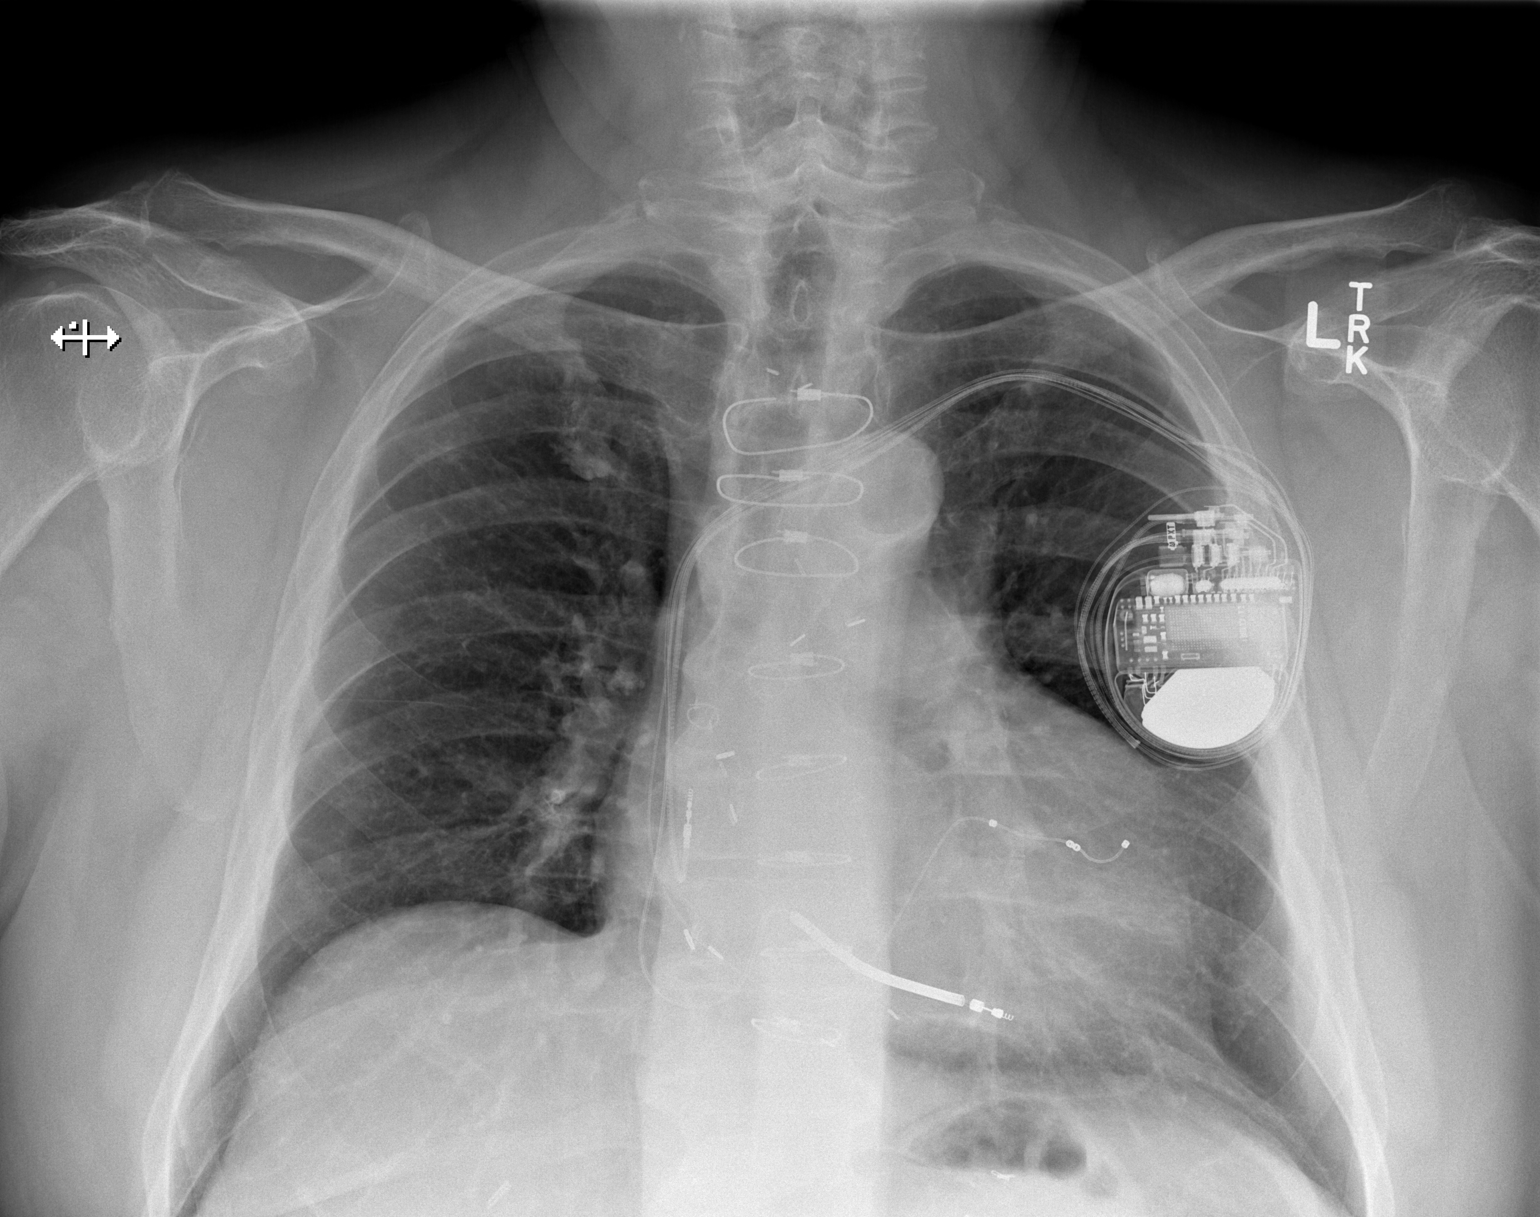

[w chest lat]
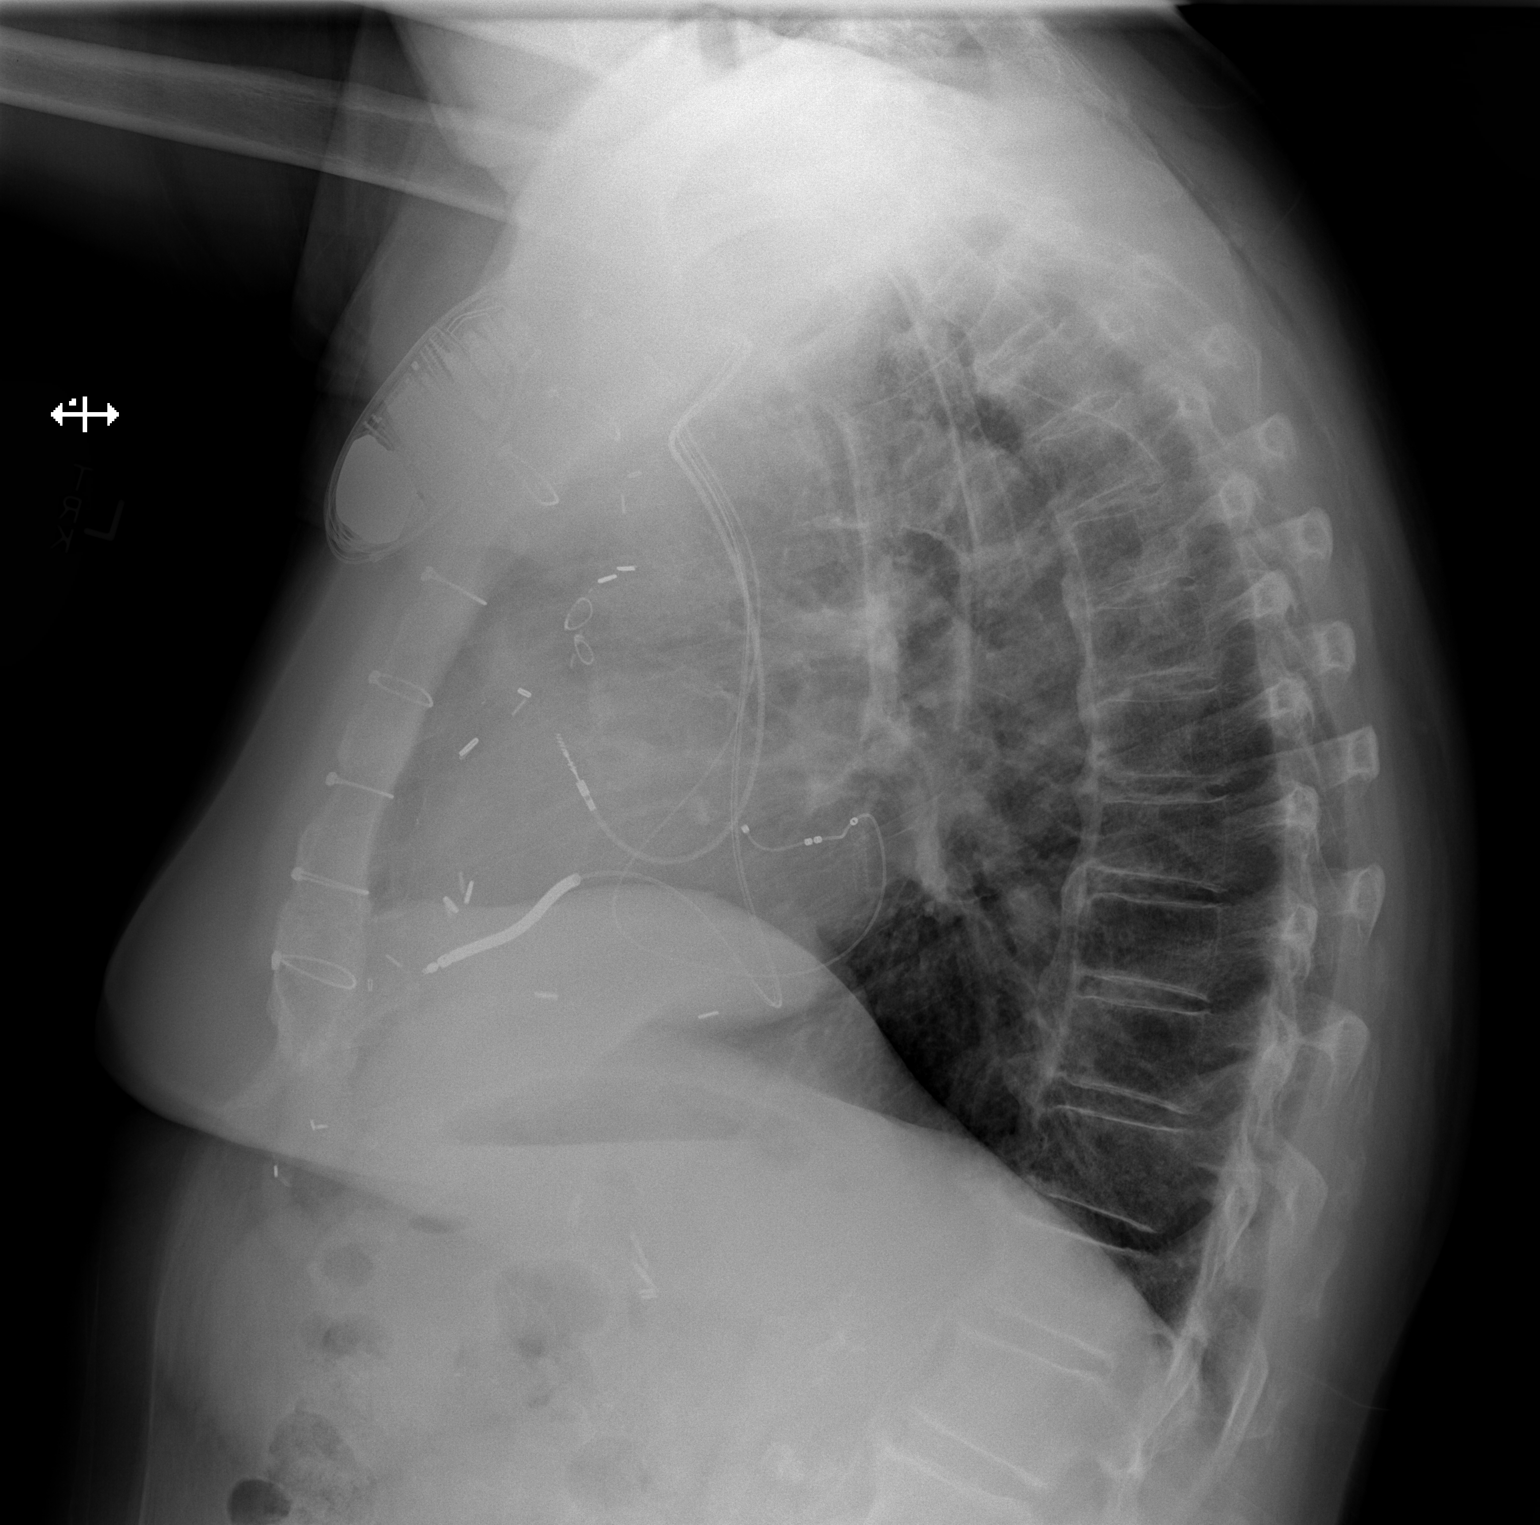

[2 of 2 positions shown; findings below may reference images not displayed]

FINDINGS: Prior median sternotomy. Moderate thoracic spondylosis. Pacer/AICD
device. Midline trachea. Moderate cardiomegaly with transverse
aortic atherosclerosis. No pleural effusion or pneumothorax. No
congestive failure. Clear lungs.
IMPRESSION: Cardiomegaly without congestive failure.

## 2018-10-04 IMAGING — CR DG CHEST 1V PORT
1 series · 1 of 1 positions shown · non-contrast
Comparison: 02/18/2016

CLINICAL DATA: Post TAVR.  Chest soreness

EXAM:
PORTABLE CHEST 1 VIEW

[AP]
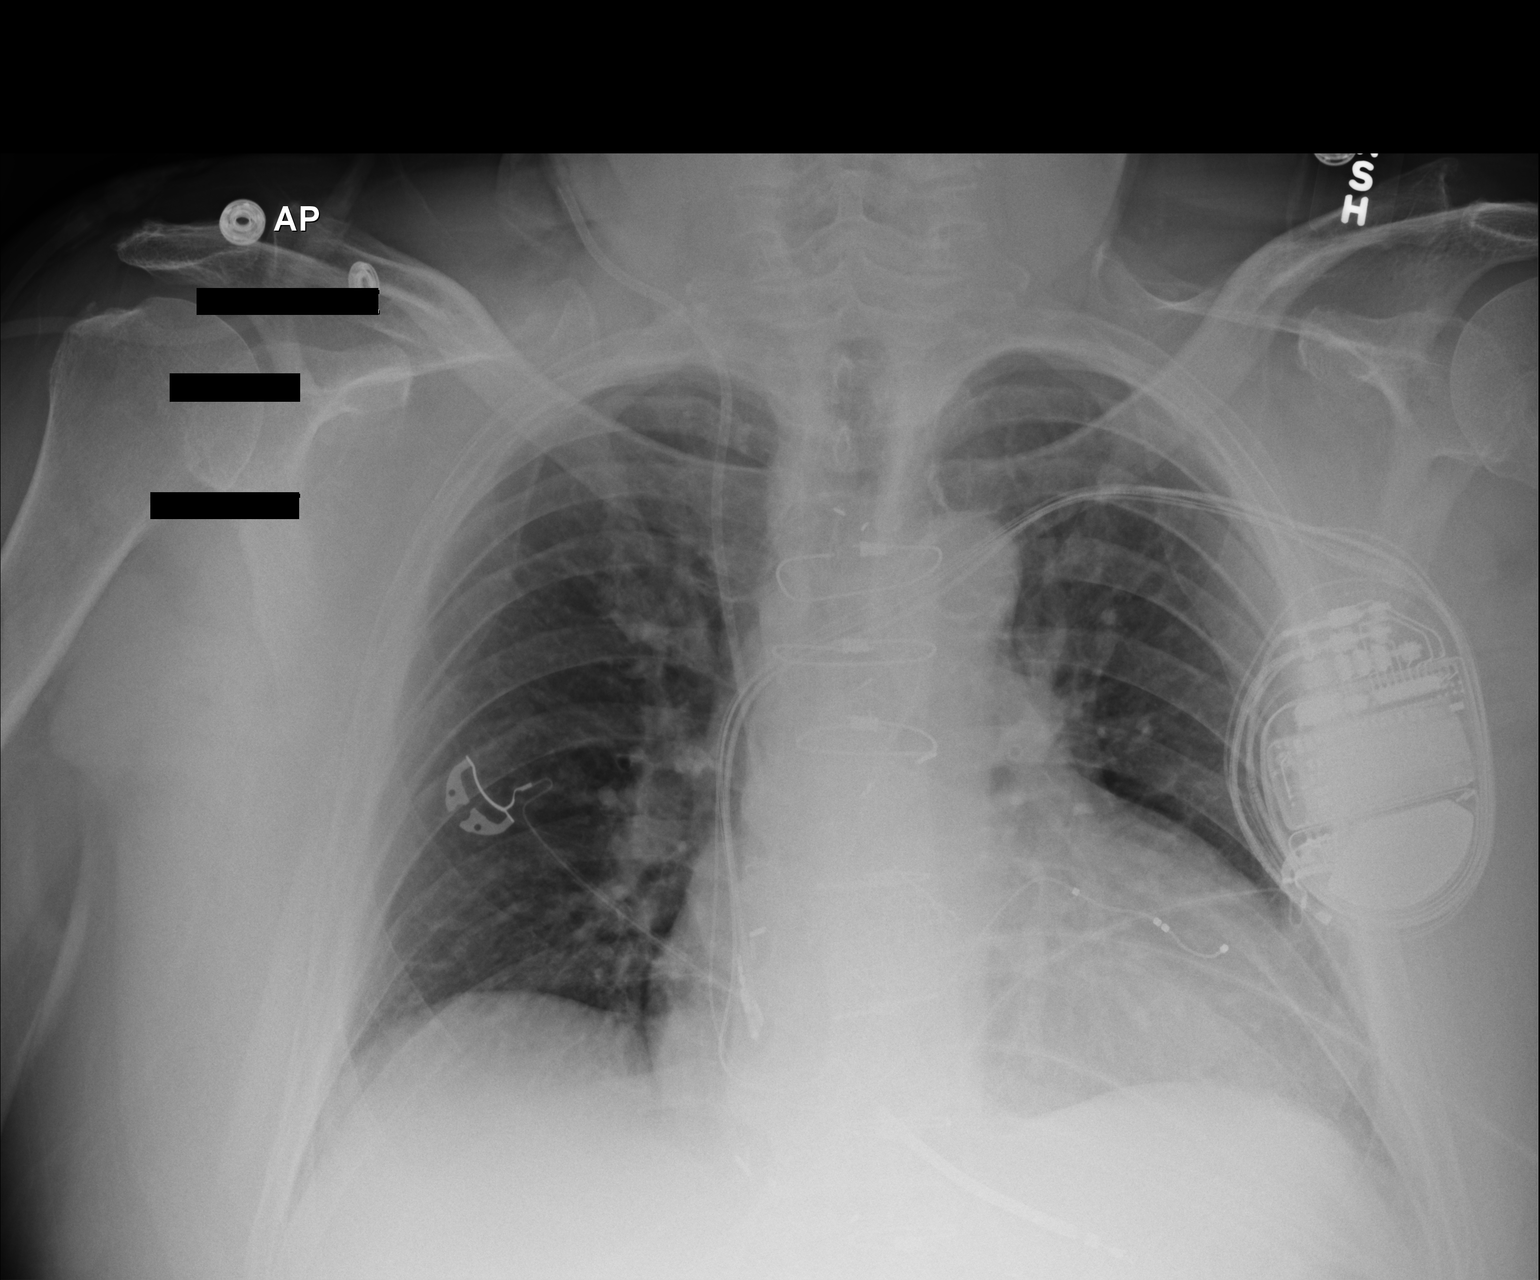

[1 of 1 positions shown; findings below may reference images not displayed]

FINDINGS: Changes of aortic valve repair. Left pacer/defibrillator remains in
place, unchanged. Stable position of the right internal jugular
central line. Cardiomegaly. No confluent opacities, effusions or
edema.
IMPRESSION: Cardiomegaly.  No active disease.

## 2018-10-09 ENCOUNTER — Other Ambulatory Visit: Payer: Self-pay | Admitting: Cardiology

## 2018-11-10 ENCOUNTER — Ambulatory Visit (INDEPENDENT_AMBULATORY_CARE_PROVIDER_SITE_OTHER): Payer: Medicare HMO | Admitting: *Deleted

## 2018-11-10 DIAGNOSIS — I5022 Chronic systolic (congestive) heart failure: Secondary | ICD-10-CM

## 2018-11-10 DIAGNOSIS — R55 Syncope and collapse: Secondary | ICD-10-CM

## 2018-11-10 LAB — CUP PACEART REMOTE DEVICE CHECK
Battery Remaining Longevity: 32 mo
Battery Voltage: 2.95 V
Brady Statistic AP VP Percent: 46.86 %
Brady Statistic AP VS Percent: 0.33 %
Brady Statistic AS VP Percent: 52.17 %
Brady Statistic AS VS Percent: 0.64 %
Brady Statistic RA Percent Paced: 46.84 %
Brady Statistic RV Percent Paced: 98.4 %
Date Time Interrogation Session: 20201029052504
HighPow Impedance: 75 Ohm
Implantable Lead Implant Date: 20161205
Implantable Lead Implant Date: 20161205
Implantable Lead Implant Date: 20161205
Implantable Lead Location: 753858
Implantable Lead Location: 753859
Implantable Lead Location: 753860
Implantable Lead Model: 4598
Implantable Lead Model: 5076
Implantable Lead Model: 6935
Implantable Pulse Generator Implant Date: 20161205
Lead Channel Impedance Value: 304 Ohm
Lead Channel Impedance Value: 342 Ohm
Lead Channel Impedance Value: 399 Ohm
Lead Channel Impedance Value: 399 Ohm
Lead Channel Impedance Value: 418 Ohm
Lead Channel Impedance Value: 456 Ohm
Lead Channel Impedance Value: 456 Ohm
Lead Channel Impedance Value: 475 Ohm
Lead Channel Impedance Value: 551 Ohm
Lead Channel Impedance Value: 589 Ohm
Lead Channel Impedance Value: 646 Ohm
Lead Channel Impedance Value: 646 Ohm
Lead Channel Impedance Value: 665 Ohm
Lead Channel Pacing Threshold Amplitude: 0.5 V
Lead Channel Pacing Threshold Amplitude: 0.625 V
Lead Channel Pacing Threshold Amplitude: 1 V
Lead Channel Pacing Threshold Pulse Width: 0.4 ms
Lead Channel Pacing Threshold Pulse Width: 0.4 ms
Lead Channel Pacing Threshold Pulse Width: 0.4 ms
Lead Channel Sensing Intrinsic Amplitude: 1.5 mV
Lead Channel Sensing Intrinsic Amplitude: 12.875 mV
Lead Channel Setting Pacing Amplitude: 2 V
Lead Channel Setting Pacing Amplitude: 2 V
Lead Channel Setting Pacing Amplitude: 2.5 V
Lead Channel Setting Pacing Pulse Width: 0.4 ms
Lead Channel Setting Pacing Pulse Width: 0.4 ms
Lead Channel Setting Sensing Sensitivity: 0.3 mV

## 2018-12-02 NOTE — Progress Notes (Signed)
Remote ICD transmission.   

## 2018-12-20 ENCOUNTER — Telehealth: Payer: Self-pay | Admitting: Cardiology

## 2018-12-20 NOTE — Telephone Encounter (Signed)
New message  Patient called in to speak with Dr.Jordan's nurse, states its about a a medical diagnoses that he would like to speak with only her about. Please give patient a call back.

## 2018-12-20 NOTE — Progress Notes (Signed)
[name] Date of Birth: 1945/05/05 Medical Record [id number]  History of Present Illness: [name] is seen for follow up CAD and aortic stenosis.  He has a known history of coronary disease and is status post CABG in 1996. This included an LIMA graft to the LAD and diagonal, sequential vein graft to the acute marginal, PDA, and posterior lateral branches of the right coronary, sequential saphenous vein graft to the obtuse marginal vessel and distal circumflex. He had a cardiac catheterization in 2007 which showed that his grafts were all patent. On 04/02/14 he had repeat cath for chest pain and grafts were again patent but there was a 90% stenosis in the SVG to OM2. This was stented with a DES.  Despite this intervention he has continued to have chest pain. He had cholecystectomy in January 0000000 without complications and without relief of his pain. CXR was unremarkable. CT of the chest was negative for PE or aortic pathology. He had EGD by Dr. Oletta Lamas that showed Barrett's esophagus but no evidence of esophagitis. He is on Protonix.   In Dec 2016  he was admitted for evaluation of syncope. Echo showed EF 30-35% with moderate to severe AS. Mean gradient 24 mm Hg. AVA 0.8-0.9. There was concern that AVA was underestimated due to low output. EEG was negative. He had a LBBB. He underwent placement of a BIV-ICD by Dr. Lovena Le.  He then  had a repeat Echo showing some increase in AV gradient. He underwent a Dobutamine Echo. This showed improvement in EF to 40-45%. There was also increase in AV gradient with a mean gradient of 41 mm Hg and severe AS. He underwent uncomplicated TAVR [id number] via a percutaneous right transfemoral approach. He was treated with a 26 mm Edwards Sapien 3 transcatheter heart valve. Repeat Echo in March 2018 showed improvement in EF to 40-45%. Echo in February 2019 was unchanged.   Despite all these interventions he has continued to have chest pain. He describes his pain as right  parasternal radiating to his jaw. It is relieved with sl Ntg. Typically occurs at night. Is able to do heavy yard work such as lifting, hauling, and blowing leaves without change. He does have some SOB. Notes numbness on the bottom of his feet.   On follow up today he is feeling well. No dizziness or syncope. No palpitations. No dyspnea. His chronic chest pain is improved. He still has indigestion and does get chest pain once or twice a week. Rarely uses sl Ntg now.   He is active walking daily and doing house/yard work.   Current Outpatient Medications on File Prior to Visit  Medication Sig Dispense Refill  . amoxicillin (AMOXIL) 500 MG tablet Take 2g (four 500 mg tablets) one hour prior to dental procedures. 4 tablet 3  . Ascorbic Acid (VITAMIN C) 1000 MG tablet Take 1,000 mg by mouth daily.    Marland Kitchen aspirin EC 81 MG EC tablet Take 1 tablet (81 mg total) by mouth daily.    Marland Kitchen atorvastatin (LIPITOR) 80 MG tablet Take 1 tablet (80 mg total) by mouth daily. 90 tablet 1  . Cholecalciferol (VITAMIN D-3) 5000 units TABS Take 1 tablet by mouth daily.    . finasteride (PROSCAR) 5 MG tablet Take 5 mg by mouth daily.    . metoprolol succinate (TOPROL-XL) 50 MG 24 hr tablet TAKE ONE TABLET BY MOUTH DAILY . TAKE WITH OR IMMEDIATELY FOLLOWING A MEAL 90 tablet 1  . oxybutynin (DITROPAN) 5 MG tablet  Take 5 mg by mouth daily.     . pantoprazole (PROTONIX) 40 MG tablet TAKE ONE TABLET BY MOUTH DAILY 90 tablet 3  . ramipril (ALTACE) 5 MG capsule TAKE ONE CAPSULE BY MOUTH DAILY 90 capsule 1  . tamsulosin (FLOMAX) 0.4 MG CAPS capsule     . valACYclovir (VALTREX) 500 MG tablet TAKE ONE TABLET BY MOUTH DAILY AS NEEDED FOR OUTBREAK 30 tablet 1   No current facility-administered medications on file prior to visit.     Allergies  Allergen Reactions  . No Known Allergies     Past Medical History:  Diagnosis Date  . Aortic stenosis   . Barrett's esophageal ulceration   . Chronic systolic CHF (congestive heart  failure) ([place])   . Coronary artery disease    a. CABG 1996: LIMA graft to the LAD and diagonal, sequential vein graft to the acute marginal, PDA, and posterior lateral branches of the right coronary, sequential saphenous vein graft to the obtuse marginal vessel and distal circumflex. b. Cath 2007 patent grafts. c. s/p DES to SVG-OM2 in 03/2014.  Marland Kitchen Coronary artery disease involving autologous vein coronary bypass graft with angina pectoris ([place]) 03/13/2014   PCI using DES in SVG to LCx system  . Dental caries    pre-heart valve surgery protocol  . Gastroesophageal reflux disease   . History of erectile dysfunction   . History of hiatal hernia   . History of kidney stones   . History of obesity   . History of prostatitis   . Hypercholesterolemia   . Hypertension   . Kidney stones   . LBBB (left bundle branch block)   . Nodule of kidney 02/04/2016   Incidental 80mm nodule upper pole right kidney noted on CT angiogram - MRI with and without gadolinium contrast recommened in 6 months  . OSA (obstructive sleep apnea)    "suppose to wear mask; I don't" (03/13/2014)  . PVC's (premature ventricular contractions)   . S/P CABG x 7 01/07/1995   LIMA to LAD-diagonal, SVG to OM-LCx, SVG to AM-PD-RPL  . S/P TAVR (transcatheter aortic valve replacement) 02/18/2016   26 mm Edwards Sapien 3 transcatheter heart valve placed via percutaneous right transfemoral approach    Past Surgical History:  Procedure Laterality Date  . [place]; ~ 2006   ; Ejection fraction is estimated at 45%  . CARDIAC CATHETERIZATION N/A 01/22/2016   Procedure: Right/Left Heart Cath and Coronary/Graft Angiography;  Surgeon:  M [name], MD;  Location: [facility];  Service: Cardiovascular;  Laterality: N/A;  . CHOLECYSTECTOMY N/A 01/23/2014   Procedure: LAPAROSCOPIC CHOLECYSTECTOMY WITH INTRAOPERATIVE CHOLANGIOGRAM ;  Surgeon: Fanny Skates, MD;  Location: [facility];  Service: General;  Laterality: N/A;  .  COLONOSCOPY W/ POLYPECTOMY    . CORONARY ANGIOPLASTY WITH STENT PLACEMENT  03/13/2014   "1"  . CORONARY ARTERY BYPASS GRAFT  1996   "CABG X 7"  . CYSTOSCOPY WITH STENT PLACEMENT    . EP IMPLANTABLE DEVICE N/A 12/17/2014   Procedure: BiV ICD Insertion CRT-D;  Surgeon: Evans Lance, MD;  Location: [facility];  Service: Cardiovascular;  Laterality: N/A;  . LEFT HEART CATHETERIZATION WITH CORONARY/GRAFT ANGIOGRAM N/A 03/13/2014   Procedure: LEFT HEART CATHETERIZATION WITH Beatrix Fetters;  Surgeon:  M [name], MD;  Location: [facility];  Service: Cardiovascular;  Laterality: N/A;  . MULTIPLE EXTRACTIONS WITH ALVEOLOPLASTY N/A 02/07/2016   Procedure: Extraction of tooth #'s 1,2,3,14,15, 18, 20, 29,30,31 with alveoloplasty and dental  cleaning of teeth.;  Surgeon: Lenn Cal, DDS;  Location: [facility];  Service: Oral Surgery;  Laterality: N/A;  . TEE WITHOUT CARDIOVERSION N/A 02/18/2016   Procedure: TRANSESOPHAGEAL ECHOCARDIOGRAM (TEE);  Surgeon: Sherren Mocha, MD;  Location: [facility];  Service: Open Heart Surgery;  Laterality: N/A;  . TONSILLECTOMY  ~ 1950  . TRANSCATHETER AORTIC VALVE REPLACEMENT, TRANSFEMORAL N/A 02/18/2016   Procedure: TRANSCATHETER AORTIC VALVE REPLACEMENT, TRANSFEMORAL;  Surgeon: Sherren Mocha, MD;  Location: [facility];  Service: Open Heart Surgery;  Laterality: N/A;    Social History   Tobacco Use  Smoking Status Former Smoker  . Packs/day: 0.75  . Years: 15.00  . Pack years: 11.25  . Types: Cigarettes  . Quit date: 01/12/1997  . Years since quitting: 21.9  Smokeless Tobacco Never Used  Tobacco Comment   3/4 pack per day. smoked 15 years off and on    Social History   Substance and Sexual Activity  Alcohol Use No  . Alcohol/week: 4.0 standard drinks  . Types: 4 Cans of beer per week    Family History  Problem Relation Age of Onset  . Heart attack Father   . Cancer Sister        Uterine Cancer    Review of Systems: As noted in HPI.  All  other systems were reviewed and are negative.  Physical Exam: BP (!) 143/70   Pulse 60   Temp (!) 96.8 F (36 C)   Ht 5\' 10"  (1.778 m)   Wt 223 lb (101.2 kg)   SpO2 97%   BMI 32.00 kg/m  GENERAL:  Well appearing WM in NAD HEENT:  PERRL, EOMI, sclera are clear. Oropharynx is clear. NECK:  No jugular venous distention, carotid upstroke brisk and symmetric, no bruits, no thyromegaly or adenopathy LUNGS:  Clear to auscultation bilaterally CHEST:  Unremarkable HEART:  RRR,  PMI not displaced or sustained,S1 and S2 within normal limits, no S3, no S4: no clicks, no rubs, gr 1/6 systolic murmur RUSB. ABD:  Soft, nontender. BS +, no masses or bruits. No hepatomegaly, no splenomegaly EXT:  2 + pulses throughout, no edema, no cyanosis no clubbing SKIN:  Warm and dry.  No rashes NEURO:  Alert and oriented x 3. Cranial nerves II through XII intact. PSYCH:  Cognitively intact    LABORATORY DATA:   Lab Results  Component Value Date   WBC 5.3 10/20/2017   HGB 14.2 10/20/2017   HCT 41.8 10/20/2017   PLT 175 10/20/2017   GLUCOSE 115 (H) 10/20/2017   CHOL 150 03/12/2015   TRIG 105 03/12/2015   HDL 38 (L) 03/12/2015   LDLCALC 91 03/12/2015   ALT 37 02/14/2016   AST 39 02/14/2016   NA 144 10/20/2017   K 3.9 10/20/2017   CL 105 10/20/2017   CREATININE 0.97 10/20/2017   BUN 23 10/20/2017   CO2 24 10/20/2017   TSH 1.572 12/16/2014   INR 2.4 09/16/2016   HGBA1C 6.0 (H) 02/14/2016    Ecg today shows AV sequential pacing with rate 62.  I have personally reviewed and interpreted this study.  Echo dated 03/13/16: Study Conclusions  - Left ventricle: The cavity size was normal. There was moderate   concentric hypertrophy. Systolic function was mildly to   moderately reduced. The estimated ejection fraction was in the   range of 40% to 45%. Hypokinesis of the anterolateral and   inferolateral myocardium; consistent with ischemia in the   distribution of the left circumflex coronary  artery. Doppler   parameters are consistent with abnormal left ventricular   relaxation (grade 1 diastolic dysfunction). Doppler parameters   are consistent with elevated mean left atrial filling pressure. - Ventricular septum: Septal motion showed paradox. These changes   are consistent with a left bundle branch block. - Aortic valve: A stent-valve bioprosthesis (TAVR) was present and   functioning normally. - Left atrium: The atrium was severely dilated. - Right ventricle: The cavity size was mildly dilated.  Impressions:  - Aortic valve gradients are similar to the 02/18/2016 study.  ------------------------------------------------------------------- Labs, prior tests, procedures, and surgery: Status post TAVR (26 mm Edwards Sapien 3 Transcatheter Bioprosthesis model # [id number]; serial # [id number]).  Coronary artery bypass grafting.  Echo 02/17/17: Study Conclusions  - Left ventricle: The cavity size was normal. Wall thickness was   increased in a pattern of mild LVH. Systolic function was mildly   to moderately reduced. The estimated ejection fraction was in the   range of 40% to 45%. Diffuse hypokinesis. Doppler parameters are   consistent with abnormal left ventricular relaxation (grade 1   diastolic dysfunction). - Aortic valve: Bioprosthetic aortic valve s/p TAVR. Trivial   perivalvular aortic regurgitation. No significant stenosis. Mean   gradient (S): 15 mm Hg. - Mitral valve: There was no significant regurgitation. - Left atrium: The atrium was mildly to moderately dilated. - Right ventricle: The cavity size was mildly dilated. Pacer wire   or catheter noted in right ventricle. Systolic function was   mildly reduced. - Right atrium: The atrium was mildly dilated. - Tricuspid valve: Peak RV-RA gradient (S): 25 mm Hg. - Systemic veins: The IVC was not visualized.   Assessment / Plan: 1. Coronary disease with remote coronary bypass surgery in 1996. Cath in March  2016 showed high grade disease in SVG to OM. s/p successful PCI with DES. Other grafts patent. Native vessels all occluded. Repeat cath in January 2018 showed patent grafts. Despite all his prior interventions he has continued to have nitrate responsive chest pain typically at night. I have recommended increasing Imdur to 120 mg qhs. Sl Ntg Rx refilled.   2.  Severe aortic stenosis. S/p TAVR with #23 Edwards Sapien valve. Stable by exam  3. Chronic systolic CHF. Asymptomatic. On ACEi and Toprol XL. EF improved to 40-45%. Continue Rx.   4. Syncope-  Now s/p BiV ICD. Paced.  5. Obesity.   6. Hyperlipidemia. On lipitor. Declines getting labs today  7. LBBB  8. Numbness in feet. Likely has peripheral neuropathy but is not diabetic. Follow up with primary.  Follow up in one year.

## 2018-12-20 NOTE — Telephone Encounter (Signed)
Return call to patient he stated he has been having chest pain for 2 weeks or longer.Stated chest pain wakes him up at night.He takes a NTG with relief.No pain at present.Appointment scheduled with Dr.Jordan tomorrow 12/9 at 3:40 pm.

## 2018-12-21 ENCOUNTER — Ambulatory Visit (INDEPENDENT_AMBULATORY_CARE_PROVIDER_SITE_OTHER): Payer: Medicare HMO | Admitting: Cardiology

## 2018-12-21 ENCOUNTER — Other Ambulatory Visit: Payer: Self-pay

## 2018-12-21 ENCOUNTER — Encounter: Payer: Self-pay | Admitting: Cardiology

## 2018-12-21 VITALS — BP 143/70 | HR 60 | Temp 96.8°F | Ht 70.0 in | Wt 223.0 lb

## 2018-12-21 DIAGNOSIS — E785 Hyperlipidemia, unspecified: Secondary | ICD-10-CM | POA: Diagnosis not present

## 2018-12-21 DIAGNOSIS — I5022 Chronic systolic (congestive) heart failure: Secondary | ICD-10-CM | POA: Diagnosis not present

## 2018-12-21 DIAGNOSIS — I25729 Atherosclerosis of autologous artery coronary artery bypass graft(s) with unspecified angina pectoris: Secondary | ICD-10-CM | POA: Diagnosis not present

## 2018-12-21 DIAGNOSIS — I447 Left bundle-branch block, unspecified: Secondary | ICD-10-CM | POA: Diagnosis not present

## 2018-12-21 DIAGNOSIS — Z952 Presence of prosthetic heart valve: Secondary | ICD-10-CM | POA: Diagnosis not present

## 2018-12-21 DIAGNOSIS — Z8679 Personal history of other diseases of the circulatory system: Secondary | ICD-10-CM

## 2018-12-21 MED ORDER — ISOSORBIDE MONONITRATE ER 120 MG PO TB24: 120.0000 mg | ORAL_TABLET | Freq: Every day | ORAL | 3 refills | Status: DC

## 2018-12-21 MED ORDER — NITROGLYCERIN 0.4 MG SL SUBL: SUBLINGUAL_TABLET | SUBLINGUAL | 5 refills | Status: DC

## 2018-12-21 NOTE — Patient Instructions (Signed)
Increase Isosorbide to 120 mg daily at bedtime  Continue  Your other therapy

## 2019-01-01 ENCOUNTER — Other Ambulatory Visit: Payer: Self-pay | Admitting: Cardiology

## 2019-01-01 DIAGNOSIS — I350 Nonrheumatic aortic (valve) stenosis: Secondary | ICD-10-CM

## 2019-01-01 DIAGNOSIS — I5022 Chronic systolic (congestive) heart failure: Secondary | ICD-10-CM

## 2019-01-14 ENCOUNTER — Other Ambulatory Visit: Payer: Self-pay | Admitting: Cardiology

## 2019-01-14 DIAGNOSIS — I350 Nonrheumatic aortic (valve) stenosis: Secondary | ICD-10-CM

## 2019-02-09 ENCOUNTER — Ambulatory Visit (INDEPENDENT_AMBULATORY_CARE_PROVIDER_SITE_OTHER): Payer: Medicare HMO | Admitting: *Deleted

## 2019-02-09 DIAGNOSIS — I5022 Chronic systolic (congestive) heart failure: Secondary | ICD-10-CM | POA: Diagnosis not present

## 2019-02-09 LAB — CUP PACEART REMOTE DEVICE CHECK
Battery Remaining Longevity: 29 mo
Battery Voltage: 2.95 V
Brady Statistic AP VP Percent: 45.94 %
Brady Statistic AP VS Percent: 0.18 %
Brady Statistic AS VP Percent: 53.32 %
Brady Statistic AS VS Percent: 0.56 %
Brady Statistic RA Percent Paced: 45.68 %
Brady Statistic RV Percent Paced: 98.48 %
Date Time Interrogation Session: 20210128022824
HighPow Impedance: 72 Ohm
Implantable Lead Implant Date: 20161205
Implantable Lead Implant Date: 20161205
Implantable Lead Implant Date: 20161205
Implantable Lead Location: 753858
Implantable Lead Location: 753859
Implantable Lead Location: 753860
Implantable Lead Model: 4598
Implantable Lead Model: 5076
Implantable Lead Model: 6935
Implantable Pulse Generator Implant Date: 20161205
Lead Channel Impedance Value: 304 Ohm
Lead Channel Impedance Value: 361 Ohm
Lead Channel Impedance Value: 361 Ohm
Lead Channel Impedance Value: 399 Ohm
Lead Channel Impedance Value: 456 Ohm
Lead Channel Impedance Value: 456 Ohm
Lead Channel Impedance Value: 456 Ohm
Lead Channel Impedance Value: 456 Ohm
Lead Channel Impedance Value: 589 Ohm
Lead Channel Impedance Value: 589 Ohm
Lead Channel Impedance Value: 608 Ohm
Lead Channel Impedance Value: 703 Ohm
Lead Channel Impedance Value: 703 Ohm
Lead Channel Pacing Threshold Amplitude: 0.625 V
Lead Channel Pacing Threshold Amplitude: 0.625 V
Lead Channel Pacing Threshold Amplitude: 0.75 V
Lead Channel Pacing Threshold Pulse Width: 0.4 ms
Lead Channel Pacing Threshold Pulse Width: 0.4 ms
Lead Channel Pacing Threshold Pulse Width: 0.4 ms
Lead Channel Sensing Intrinsic Amplitude: 1.5 mV
Lead Channel Sensing Intrinsic Amplitude: 1.5 mV
Lead Channel Sensing Intrinsic Amplitude: 12.875 mV
Lead Channel Sensing Intrinsic Amplitude: 12.875 mV
Lead Channel Setting Pacing Amplitude: 1.75 V
Lead Channel Setting Pacing Amplitude: 2 V
Lead Channel Setting Pacing Amplitude: 2.5 V
Lead Channel Setting Pacing Pulse Width: 0.4 ms
Lead Channel Setting Pacing Pulse Width: 0.4 ms
Lead Channel Setting Sensing Sensitivity: 0.3 mV

## 2019-02-10 NOTE — Progress Notes (Signed)
ICD Remote  

## 2019-03-18 ENCOUNTER — Other Ambulatory Visit: Payer: Self-pay | Admitting: Cardiology

## 2019-03-18 DIAGNOSIS — Z952 Presence of prosthetic heart valve: Secondary | ICD-10-CM

## 2019-03-18 DIAGNOSIS — I350 Nonrheumatic aortic (valve) stenosis: Secondary | ICD-10-CM

## 2019-03-30 ENCOUNTER — Other Ambulatory Visit: Payer: Self-pay | Admitting: Cardiology

## 2019-03-30 DIAGNOSIS — I350 Nonrheumatic aortic (valve) stenosis: Secondary | ICD-10-CM

## 2019-04-05 ENCOUNTER — Telehealth: Payer: Self-pay

## 2019-04-05 NOTE — Progress Notes (Signed)
[name] Date of Birth: 11-12-1945 Medical Record [id number]  History of Present Illness: [name] is seen for evaluation of chest pain.  He has a known history of coronary disease and is status post CABG in 1996. This included an LIMA graft to the LAD and diagonal, sequential vein graft to the acute marginal, PDA, and posterior lateral branches of the right coronary, sequential saphenous vein graft to the obtuse marginal vessel and distal circumflex. He had a cardiac catheterization in 2007 which showed that his grafts were all patent. On 04/02/14 he had repeat cath for chest pain and grafts were again patent but there was a 90% stenosis in the SVG to OM2. This was stented with a DES.  Despite this intervention he has continued to have chest pain. He had cholecystectomy in January 0000000 without complications and without relief of his pain. CXR was unremarkable. CT of the chest was negative for PE or aortic pathology. He had EGD by Dr. Oletta Lamas that showed Barrett's esophagus but no evidence of esophagitis. He is on Protonix.   In Dec 2016  he was admitted for evaluation of syncope. Echo showed EF 30-35% with moderate to severe AS. Mean gradient 24 mm Hg. AVA 0.8-0.9. There was concern that AVA was underestimated due to low output. EEG was negative. He had a LBBB. He underwent placement of a BIV-ICD by Dr. Lovena Le.  He then  had a repeat Echo showing some increase in AV gradient. He underwent a Dobutamine Echo. This showed improvement in EF to 40-45%. There was also increase in AV gradient with a mean gradient of 41 mm Hg and severe AS. He underwent uncomplicated TAVR [id number] via a percutaneous right transfemoral approach. He was treated with a 26 mm Edwards Sapien 3 transcatheter heart valve. Repeat Echo in March 2018 showed improvement in EF to 40-45%. Echo in February 2019 was unchanged.   Despite all these interventions he has continued to have chest pain. He previously described his pain as  right parasternal radiating to his jaw. It is relieved with sl Ntg. Typically occurs at night. He was able to do heavy yard work such as lifting, hauling, and blowing leaves without change. He does have some SOB. We did switch to him taking Imdur at bedtime and this seemed to help.  Now he presents with symptoms of chest pain with exertion. Pain is right parasternal radiating to his jaw. It is now with exertion such as spreading pine needles, lifting, digging holes. Can only go about 15 minutes before he has to stop. Symptoms are relieved with sl NTg and he may take 3-4 a day. Also has SOB.    Current Outpatient Medications on File Prior to Visit  Medication Sig Dispense Refill  . amoxicillin (AMOXIL) 500 MG tablet Take 2g (four 500 mg tablets) one hour prior to dental procedures. 4 tablet 3  . Ascorbic Acid (VITAMIN C) 1000 MG tablet Take 1,000 mg by mouth daily.    Marland Kitchen aspirin EC 81 MG EC tablet Take 1 tablet (81 mg total) by mouth daily.    Marland Kitchen atorvastatin (LIPITOR) 80 MG tablet TAKE ONE TABLET BY MOUTH DAILY 90 tablet 0  . Cholecalciferol (VITAMIN D-3) 5000 units TABS Take 1 tablet by mouth daily.    . finasteride (PROSCAR) 5 MG tablet Take 5 mg by mouth daily.    . isosorbide mononitrate (IMDUR) 120 MG 24 hr tablet Take 1 tablet (120 mg total) by mouth daily. 90 tablet 3  . metoprolol  succinate (TOPROL-XL) 50 MG 24 hr tablet TAKE ONE TABLET BY MOUTH DAILY WITH OR IMMEDIATELY FOLLOWING A MEAL 90 tablet 0  . nitroGLYCERIN (NITROSTAT) 0.4 MG SL tablet Take one sl PRN chest pain 100 tablet 5  . oxybutynin (DITROPAN) 5 MG tablet Take 5 mg by mouth daily.     . pantoprazole (PROTONIX) 40 MG tablet TAKE ONE TABLET BY MOUTH DAILY 90 tablet 2  . ramipril (ALTACE) 5 MG capsule TAKE ONE CAPSULE BY MOUTH DAILY 90 capsule 3  . tamsulosin (FLOMAX) 0.4 MG CAPS capsule     . valACYclovir (VALTREX) 500 MG tablet TAKE ONE TABLET BY MOUTH DAILY AS NEEDED FOR OUTBREAK 30 tablet 1   No current  facility-administered medications on file prior to visit.    Allergies  Allergen Reactions  . No Known Allergies     Past Medical History:  Diagnosis Date  . Aortic stenosis   . Barrett's esophageal ulceration   . Chronic systolic CHF (congestive heart failure) ([place])   . Coronary artery disease    a. CABG 1996: LIMA graft to the LAD and diagonal, sequential vein graft to the acute marginal, PDA, and posterior lateral branches of the right coronary, sequential saphenous vein graft to the obtuse marginal vessel and distal circumflex. b. Cath 2007 patent grafts. c. s/p DES to SVG-OM2 in 03/2014.  Marland Kitchen Coronary artery disease involving autologous vein coronary bypass graft with angina pectoris ([place]) 03/13/2014   PCI using DES in SVG to LCx system  . Dental caries    pre-heart valve surgery protocol  . Gastroesophageal reflux disease   . History of erectile dysfunction   . History of hiatal hernia   . History of kidney stones   . History of obesity   . History of prostatitis   . Hypercholesterolemia   . Hypertension   . Kidney stones   . LBBB (left bundle branch block)   . Nodule of kidney 02/04/2016   Incidental 69mm nodule upper pole right kidney noted on CT angiogram - MRI with and without gadolinium contrast recommened in 6 months  . OSA (obstructive sleep apnea)    "suppose to wear mask; I don't" (03/13/2014)  . PVC's (premature ventricular contractions)   . S/P CABG x 7 01/07/1995   LIMA to LAD-diagonal, SVG to OM-LCx, SVG to AM-PD-RPL  . S/P TAVR (transcatheter aortic valve replacement) 02/18/2016   26 mm Edwards Sapien 3 transcatheter heart valve placed via percutaneous right transfemoral approach    Past Surgical History:  Procedure Laterality Date  . [place]; ~ 2006   ; Ejection fraction is estimated at 45%  . CARDIAC CATHETERIZATION N/A 01/22/2016   Procedure: Right/Left Heart Cath and Coronary/Graft Angiography;  Surgeon: Josslin Sanjuan M [name], MD;  Location:  [facility];  Service: Cardiovascular;  Laterality: N/A;  . CHOLECYSTECTOMY N/A 01/23/2014   Procedure: LAPAROSCOPIC CHOLECYSTECTOMY WITH INTRAOPERATIVE CHOLANGIOGRAM ;  Surgeon: Fanny Skates, MD;  Location: [facility];  Service: General;  Laterality: N/A;  . COLONOSCOPY W/ POLYPECTOMY    . CORONARY ANGIOPLASTY WITH STENT PLACEMENT  03/13/2014   "1"  . CORONARY ARTERY BYPASS GRAFT  1996   "CABG X 7"  . CYSTOSCOPY WITH STENT PLACEMENT    . EP IMPLANTABLE DEVICE N/A 12/17/2014   Procedure: BiV ICD Insertion CRT-D;  Surgeon: Evans Lance, MD;  Location: [facility];  Service: Cardiovascular;  Laterality: N/A;  . LEFT HEART CATHETERIZATION WITH CORONARY/GRAFT ANGIOGRAM N/A 03/13/2014   Procedure: LEFT  HEART CATHETERIZATION WITH Beatrix Fetters;  Surgeon: Leeroy Lovings M [name], MD;  Location: [facility];  Service: Cardiovascular;  Laterality: N/A;  . MULTIPLE EXTRACTIONS WITH ALVEOLOPLASTY N/A 02/07/2016   Procedure: Extraction of tooth #'s 1,2,3,14,15, 18, 20, 29,30,31 with alveoloplasty and dental cleaning of teeth.;  Surgeon: Lenn Cal, DDS;  Location: [facility];  Service: Oral Surgery;  Laterality: N/A;  . TEE WITHOUT CARDIOVERSION N/A 02/18/2016   Procedure: TRANSESOPHAGEAL ECHOCARDIOGRAM (TEE);  Surgeon: Sherren Mocha, MD;  Location: [facility];  Service: Open Heart Surgery;  Laterality: N/A;  . TONSILLECTOMY  ~ 1950  . TRANSCATHETER AORTIC VALVE REPLACEMENT, TRANSFEMORAL N/A 02/18/2016   Procedure: TRANSCATHETER AORTIC VALVE REPLACEMENT, TRANSFEMORAL;  Surgeon: Sherren Mocha, MD;  Location: [facility];  Service: Open Heart Surgery;  Laterality: N/A;    Social History   Tobacco Use  Smoking Status Former Smoker  . Packs/day: 0.75  . Years: 15.00  . Pack years: 11.25  . Types: Cigarettes  . Quit date: 01/12/1997  . Years since quitting: 22.2  Smokeless Tobacco Never Used  Tobacco Comment   3/4 pack per day. smoked 15 years off and on    Social History   Substance and Sexual  Activity  Alcohol Use No  . Alcohol/week: 4.0 standard drinks  . Types: 4 Cans of beer per week    Family History  Problem Relation Age of Onset  . Heart attack Father   . Cancer Sister        Uterine Cancer    Review of Systems: As noted in HPI.  All other systems were reviewed and are negative.  Physical Exam: BP (!) 150/76   Pulse 60   Temp (!) 96.9 F (36.1 C)   Ht 5\' 10"  (1.778 m)   Wt 216 lb 12.8 oz (98.3 kg)   SpO2 95%   BMI 31.11 kg/m  GENERAL:  Well appearing WM in NAD HEENT:  PERRL, EOMI, sclera are clear. Oropharynx is clear. NECK:  No jugular venous distention, carotid upstroke brisk and symmetric, no bruits, no thyromegaly or adenopathy LUNGS:  Clear to auscultation bilaterally CHEST:  Unremarkable HEART:  RRR,  PMI not displaced or sustained,S1 and S2 within normal limits, no S3, no S4: no clicks, no rubs, gr 1/6 systolic murmur RUSB. ABD:  Soft, nontender. BS +, no masses or bruits. No hepatomegaly, no splenomegaly EXT:  2 + pulses throughout, no edema, no cyanosis no clubbing SKIN:  Warm and dry.  No rashes NEURO:  Alert and oriented x 3. Cranial nerves II through XII intact. PSYCH:  Cognitively intact    LABORATORY DATA:   Lab Results  Component Value Date   WBC 5.3 10/20/2017   HGB 14.2 10/20/2017   HCT 41.8 10/20/2017   PLT 175 10/20/2017   GLUCOSE 115 (H) 10/20/2017   CHOL 150 03/12/2015   TRIG 105 03/12/2015   HDL 38 (L) 03/12/2015   LDLCALC 91 03/12/2015   ALT 37 02/14/2016   AST 39 02/14/2016   NA 144 10/20/2017   K 3.9 10/20/2017   CL 105 10/20/2017   CREATININE 0.97 10/20/2017   BUN 23 10/20/2017   CO2 24 10/20/2017   TSH 1.572 12/16/2014   INR 2.4 09/16/2016   HGBA1C 6.0 (H) 02/14/2016    Ecg today shows AV sequential pacing with rate 60. Bi V paced.  I have personally reviewed and interpreted this study.  Echo dated 03/13/16: Study Conclusions  - Left ventricle: The cavity size was normal. There was moderate  concentric hypertrophy. Systolic function was mildly to   moderately reduced. The estimated ejection fraction was in the   range of 40% to 45%. Hypokinesis of the anterolateral and   inferolateral myocardium; consistent with ischemia in the   distribution of the left circumflex coronary artery. Doppler   parameters are consistent with abnormal left ventricular   relaxation (grade 1 diastolic dysfunction). Doppler parameters   are consistent with elevated mean left atrial filling pressure. - Ventricular septum: Septal motion showed paradox. These changes   are consistent with a left bundle branch block. - Aortic valve: A stent-valve bioprosthesis (TAVR) was present and   functioning normally. - Left atrium: The atrium was severely dilated. - Right ventricle: The cavity size was mildly dilated.  Impressions:  - Aortic valve gradients are similar to the 02/18/2016 study.  ------------------------------------------------------------------- Labs, prior tests, procedures, and surgery: Status post TAVR (26 mm Edwards Sapien 3 Transcatheter Bioprosthesis model # [id number]; serial # [id number]).  Coronary artery bypass grafting.  Echo 02/17/17: Study Conclusions  - Left ventricle: The cavity size was normal. Wall thickness was   increased in a pattern of mild LVH. Systolic function was mildly   to moderately reduced. The estimated ejection fraction was in the   range of 40% to 45%. Diffuse hypokinesis. Doppler parameters are   consistent with abnormal left ventricular relaxation (grade 1   diastolic dysfunction). - Aortic valve: Bioprosthetic aortic valve s/p TAVR. Trivial   perivalvular aortic regurgitation. No significant stenosis. Mean   gradient (S): 15 mm Hg. - Mitral valve: There was no significant regurgitation. - Left atrium: The atrium was mildly to moderately dilated. - Right ventricle: The cavity size was mildly dilated. Pacer wire   or catheter noted in right ventricle.  Systolic function was   mildly reduced. - Right atrium: The atrium was mildly dilated. - Tricuspid valve: Peak RV-RA gradient (S): 25 mm Hg. - Systemic veins: The IVC was not visualized.   Assessment / Plan: 1. Coronary disease with remote coronary bypass surgery in 1996. Cath in March 2016 showed high grade disease in SVG to OM. s/p successful PCI with DES.  Native vessels all occluded and he is graft dependent. Repeat cath in January 2018 showed patent grafts. He has had refractory angina mostly at night but is now having symptoms with exertion. I think the only way we could evaluate this is with repeat cardiac cath. Myoview will not be very helpful since abnormal before. He is on Imdur 120 mg daily and Toprol XL. We discussed options for increased medical therapy versus cardiac cath with possible PCI. He would like to try medical therapy first. Will add Ranexa 500 mg bid. If symptoms worsen cardiac cath would be needed. He is adamant that he will not have another bypass operation.  2.  Severe aortic stenosis. S/p TAVR with #23 Edwards Sapien valve. Stable by exam  3. Chronic systolic CHF. Asymptomatic. On ACEi and Toprol XL. EF improved to 40-45%. Continue Rx.   4. Syncope-  Now s/p BiV ICD. Paced.  5. Obesity.   6. Hyperlipidemia. On lipitor. Declines getting labs today  7. LBBB   Follow up in 4 weeks

## 2019-04-05 NOTE — Telephone Encounter (Signed)
Received a message from Levonne Spiller RN patient left her a message he has been having chest pain.Spoke to patient.He stated he has been having chest pain.Stated pain radiates up into jaw and teeth.Stated pain is relieved by NTG.Stated he took 3 NTG this morning with relief.Appointment scheduled with Dr.Jordan 3/25 at 1:20 pm.Advised to go to ED if chest worsens.

## 2019-04-06 ENCOUNTER — Ambulatory Visit: Payer: Medicare HMO | Admitting: Cardiology

## 2019-04-06 ENCOUNTER — Other Ambulatory Visit: Payer: Self-pay

## 2019-04-06 ENCOUNTER — Encounter: Payer: Self-pay | Admitting: Cardiology

## 2019-04-06 VITALS — BP 150/76 | HR 60 | Temp 96.9°F | Ht 70.0 in | Wt 216.8 lb

## 2019-04-06 DIAGNOSIS — E785 Hyperlipidemia, unspecified: Secondary | ICD-10-CM | POA: Diagnosis not present

## 2019-04-06 DIAGNOSIS — Z952 Presence of prosthetic heart valve: Secondary | ICD-10-CM

## 2019-04-06 DIAGNOSIS — I447 Left bundle-branch block, unspecified: Secondary | ICD-10-CM

## 2019-04-06 DIAGNOSIS — I25729 Atherosclerosis of autologous artery coronary artery bypass graft(s) with unspecified angina pectoris: Secondary | ICD-10-CM | POA: Diagnosis not present

## 2019-04-06 MED ORDER — RANOLAZINE ER 500 MG PO TB12: 500.0000 mg | ORAL_TABLET | Freq: Two times a day (BID) | ORAL | 11 refills | Status: DC

## 2019-04-06 MED ORDER — RANOLAZINE ER 500 MG PO TB12: 500.0000 mg | ORAL_TABLET | Freq: Two times a day (BID) | ORAL | 3 refills | Status: DC

## 2019-04-06 NOTE — Patient Instructions (Addendum)
Medication Instructions:  Start Ranexa 500 mg twice a day    Lab Work: None ordered    Testing/Procedures: None ordered   Follow-Up: At [facility], you and your health needs are our priority.  As part of our continuing mission to provide you with exceptional heart care, we have created designated Provider Care Teams.  These Care Teams include your primary Cardiologist (physician) and Advanced Practice Providers (APPs -  Physician Assistants and Nurse Practitioners) who all work together to provide you with the care you need, when you need it.  We recommend signing up for the patient portal called "MyChart".  Sign up information is provided on this After Visit Summary.  MyChart is used to connect with patients for Virtual Visits (Telemedicine).  Patients are able to view lab/test results, encounter notes, upcoming appointments, etc.  Non-urgent messages can be sent to your provider as well.   To learn more about what you can do with MyChart, go to [email or web address].    Your next appointment:  Wed 4/28 at 1:40 pm   The format for your next appointment: Office   Provider:  Dr.Jordan

## 2019-04-08 ENCOUNTER — Other Ambulatory Visit: Payer: Self-pay | Admitting: Cardiology

## 2019-04-08 DIAGNOSIS — I350 Nonrheumatic aortic (valve) stenosis: Secondary | ICD-10-CM

## 2019-04-13 ENCOUNTER — Other Ambulatory Visit: Payer: Self-pay | Admitting: Cardiology

## 2019-04-13 DIAGNOSIS — I350 Nonrheumatic aortic (valve) stenosis: Secondary | ICD-10-CM

## 2019-05-02 ENCOUNTER — Emergency Department (HOSPITAL_COMMUNITY): Payer: Medicare HMO

## 2019-05-02 ENCOUNTER — Other Ambulatory Visit: Payer: Self-pay

## 2019-05-02 ENCOUNTER — Encounter (HOSPITAL_COMMUNITY): Payer: Self-pay | Admitting: Emergency Medicine

## 2019-05-02 ENCOUNTER — Emergency Department (HOSPITAL_COMMUNITY)
Admission: EM | Admit: 2019-05-02 | Discharge: 2019-05-03 | Disposition: A | Discharge status: Home / Self Care | Payer: Medicare HMO | Attending: Emergency Medicine | Admitting: Emergency Medicine

## 2019-05-02 DIAGNOSIS — Z87891 Personal history of nicotine dependence: Secondary | ICD-10-CM | POA: Diagnosis not present

## 2019-05-02 DIAGNOSIS — R001 Bradycardia, unspecified: Secondary | ICD-10-CM | POA: Diagnosis not present

## 2019-05-02 DIAGNOSIS — I10 Essential (primary) hypertension: Secondary | ICD-10-CM | POA: Diagnosis not present

## 2019-05-02 DIAGNOSIS — I5022 Chronic systolic (congestive) heart failure: Secondary | ICD-10-CM | POA: Insufficient documentation

## 2019-05-02 DIAGNOSIS — R5383 Other fatigue: Secondary | ICD-10-CM | POA: Insufficient documentation

## 2019-05-02 DIAGNOSIS — I110 Hypertensive heart disease with heart failure: Secondary | ICD-10-CM | POA: Insufficient documentation

## 2019-05-02 DIAGNOSIS — I2510 Atherosclerotic heart disease of native coronary artery without angina pectoris: Secondary | ICD-10-CM | POA: Diagnosis not present

## 2019-05-02 DIAGNOSIS — R110 Nausea: Secondary | ICD-10-CM | POA: Diagnosis not present

## 2019-05-02 DIAGNOSIS — R0789 Other chest pain: Secondary | ICD-10-CM | POA: Diagnosis not present

## 2019-05-02 DIAGNOSIS — Z7982 Long term (current) use of aspirin: Secondary | ICD-10-CM | POA: Insufficient documentation

## 2019-05-02 DIAGNOSIS — Z79899 Other long term (current) drug therapy: Secondary | ICD-10-CM | POA: Diagnosis not present

## 2019-05-02 DIAGNOSIS — R55 Syncope and collapse: Secondary | ICD-10-CM

## 2019-05-02 DIAGNOSIS — Z951 Presence of aortocoronary bypass graft: Secondary | ICD-10-CM | POA: Insufficient documentation

## 2019-05-02 DIAGNOSIS — R0902 Hypoxemia: Secondary | ICD-10-CM | POA: Diagnosis not present

## 2019-05-02 DIAGNOSIS — R079 Chest pain, unspecified: Secondary | ICD-10-CM | POA: Diagnosis not present

## 2019-05-02 LAB — CBC WITH DIFFERENTIAL/PLATELET
Abs Immature Granulocytes: 0.06 10*3/uL (ref 0.00–0.07)
Basophils Absolute: 0 10*3/uL (ref 0.0–0.1)
Basophils Relative: 1 %
Eosinophils Absolute: 0.1 10*3/uL (ref 0.0–0.5)
Eosinophils Relative: 3 %
HCT: 41.6 % (ref 39.0–52.0)
Hemoglobin: 13.6 g/dL (ref 13.0–17.0)
Immature Granulocytes: 1 %
Lymphocytes Relative: 16 %
Lymphs Abs: 0.8 10*3/uL (ref 0.7–4.0)
MCH: 30.9 pg (ref 26.0–34.0)
MCHC: 32.7 g/dL (ref 30.0–36.0)
MCV: 94.5 fL (ref 80.0–100.0)
Monocytes Absolute: 0.8 10*3/uL (ref 0.1–1.0)
Monocytes Relative: 15 %
Neutro Abs: 3.4 10*3/uL (ref 1.7–7.7)
Neutrophils Relative %: 64 %
Platelets: 159 10*3/uL (ref 150–400)
RBC: 4.4 MIL/uL (ref 4.22–5.81)
RDW: 12.6 % (ref 11.5–15.5)
WBC: 5.2 10*3/uL (ref 4.0–10.5)
nRBC: 0 % (ref 0.0–0.2)

## 2019-05-02 LAB — BASIC METABOLIC PANEL
Anion gap: 10 (ref 5–15)
BUN: 27 mg/dL — ABNORMAL HIGH (ref 8–23)
CO2: 26 mmol/L (ref 22–32)
Calcium: 9.2 mg/dL (ref 8.9–10.3)
Chloride: 104 mmol/L (ref 98–111)
Creatinine, Ser: 1.29 mg/dL — ABNORMAL HIGH (ref 0.61–1.24)
GFR calc Af Amer: >60 mL/min (ref >60)
GFR calc non Af Amer: 53 mL/min — ABNORMAL LOW (ref >60)
Glucose, Bld: 133 mg/dL — ABNORMAL HIGH (ref 70–99)
Potassium: 4 mmol/L (ref 3.5–5.1)
Sodium: 140 mmol/L (ref 135–145)

## 2019-05-02 LAB — MAGNESIUM: Magnesium: 2 mg/dL (ref 1.7–2.4)

## 2019-05-02 LAB — TROPONIN I (HIGH SENSITIVITY)
Troponin I (High Sensitivity): 15 ng/L (ref <18)
Troponin I (High Sensitivity): 17 ng/L (ref <18)

## 2019-05-02 LAB — CBG MONITORING, ED: Glucose-Capillary: 121 mg/dL — ABNORMAL HIGH (ref 70–99)

## 2019-05-02 NOTE — Progress Notes (Signed)
[name] Date of Birth: [birth year] Medical Record [id number]  History of Present Illness: [name] is seen for evaluation of chest pain.  He has a known history of coronary disease and is status post CABG in 1996. This included an LIMA graft to the LAD and diagonal, sequential vein graft to the acute marginal, PDA, and posterior lateral branches of the right coronary, sequential saphenous vein graft to the obtuse marginal vessel and distal circumflex. He had a cardiac catheterization in 2007 which showed that his grafts were all patent. On 04/02/14 he had repeat cath for chest pain and grafts were again patent but there was a 90% stenosis in the SVG to OM2. This was stented with a DES.  Despite this intervention he has continued to have chest pain. He had cholecystectomy in January 0000000 without complications and without relief of his pain. CXR was unremarkable. CT of the chest was negative for PE or aortic pathology. He had EGD by Dr. Oletta Lamas that showed Barrett's esophagus but no evidence of esophagitis. He is on Protonix.   In Dec 2016  he was admitted for evaluation of syncope. Echo showed EF 30-35% with moderate to severe AS. Mean gradient 24 mm Hg. AVA 0.8-0.9. There was concern that AVA was underestimated due to low output. EEG was negative. He had a LBBB. He underwent placement of a BIV-ICD by Dr. Lovena Le.  He then  had a repeat Echo showing some increase in AV gradient. He underwent a Dobutamine Echo. This showed improvement in EF to 40-45%. There was also increase in AV gradient with a mean gradient of 41 mm Hg and severe AS. He underwent uncomplicated TAVR [id number] via a percutaneous right transfemoral approach. He was treated with a 26 mm Edwards Sapien 3 transcatheter heart valve. Repeat Echo in March 2018 showed improvement in EF to 40-45%. Echo in February 2019 was unchanged.   Despite all these interventions he has continued to have chest pain. He previously described his pain as  right parasternal radiating to his jaw. It is relieved with sl Ntg. Typically occurs at night. He was able to do heavy yard work such as lifting, hauling, and blowing leaves without change. He does have some SOB. We did switch to him taking Imdur at bedtime and this seemed to help.  Seen last month with symptoms of chest pain. Pain is right parasternal radiating to his jaw. It is now with exertion such as spreading pine needles, lifting, digging holes. Can only go about 15 minutes before he has to stop. Symptoms are relieved with sl NTg and he may take 3-4 a day. Also has SOB. We decided to try Ranexa and he was started on 500 mg bid. This did help his angina but at this dose he developed dizziness. He cut the evening dose to 250 mg and is tolerating this well. On 4/20 he presented to ED with complaints of weakness, chest pain, confusion and dizziness. Pacer check was OK. Troponin normal. CXR clear. DC home. Since then no further problems.     Current Outpatient Medications on File Prior to Visit  Medication Sig Dispense Refill  . amoxicillin (AMOXIL) 500 MG tablet Take 2g (four 500 mg tablets) one hour prior to dental procedures. 4 tablet 3  . Ascorbic Acid (VITAMIN C) 1000 MG tablet Take 1,000 mg by mouth daily.    Marland Kitchen aspirin EC 81 MG EC tablet Take 1 tablet (81 mg total) by mouth daily.    Marland Kitchen atorvastatin (LIPITOR) 80  MG tablet TAKE ONE TABLET BY MOUTH DAILY 90 tablet 0  . Cholecalciferol (VITAMIN D-3) 5000 units TABS Take 5,000 Units by mouth daily.     . finasteride (PROSCAR) 5 MG tablet Take 5 mg by mouth daily.    . isosorbide mononitrate (IMDUR) 120 MG 24 hr tablet Take 1 tablet (120 mg total) by mouth daily. 90 tablet 3  . metoprolol succinate (TOPROL-XL) 50 MG 24 hr tablet TAKE ONE TABLET BY MOUTH DAILY WITH OR IMMEDIATELY FOLLOWING A MEAL 90 tablet 2  . oxybutynin (DITROPAN) 5 MG tablet Take 5 mg by mouth daily.     . pantoprazole (PROTONIX) 40 MG tablet TAKE ONE TABLET BY MOUTH DAILY 90  tablet 2  . ramipril (ALTACE) 5 MG capsule TAKE ONE CAPSULE BY MOUTH DAILY 90 capsule 3  . ranolazine (RANEXA) 500 MG 12 hr tablet Take 750 mg by mouth daily. 500 mg in the AM, 250mg  in the PM    . valACYclovir (VALTREX) 500 MG tablet TAKE ONE TABLET BY MOUTH DAILY AS NEEDED FOR OUTBREAK 30 tablet 1   No current facility-administered medications on file prior to visit.    Allergies  Allergen Reactions  . Ranexa [Ranolazine] Other (See Comments)    Caused vertigo at 500 mg twice-a-day dosing    Past Medical History:  Diagnosis Date  . Aortic stenosis   . Barrett's esophageal ulceration   . Chronic systolic CHF (congestive heart failure) ([place])   . Coronary artery disease    a. CABG 1996: LIMA graft to the LAD and diagonal, sequential vein graft to the acute marginal, PDA, and posterior lateral branches of the right coronary, sequential saphenous vein graft to the obtuse marginal vessel and distal circumflex. b. Cath 2007 patent grafts. c. s/p DES to SVG-OM2 in 03/2014.  Marland Kitchen Coronary artery disease involving autologous vein coronary bypass graft with angina pectoris ([place]) 03/13/2014   PCI using DES in SVG to LCx system  . Dental caries    pre-heart valve surgery protocol  . Gastroesophageal reflux disease   . History of erectile dysfunction   . History of hiatal hernia   . History of kidney stones   . History of obesity   . History of prostatitis   . Hypercholesterolemia   . Hypertension   . Kidney stones   . LBBB (left bundle branch block)   . Nodule of kidney 02/04/2016   Incidental 63mm nodule upper pole right kidney noted on CT angiogram - MRI with and without gadolinium contrast recommened in 6 months  . OSA (obstructive sleep apnea)    "suppose to wear mask; I don't" (03/13/2014)  . PVC's (premature ventricular contractions)   . S/P CABG x 7 01/07/1995   LIMA to LAD-diagonal, SVG to OM-LCx, SVG to AM-PD-RPL  . S/P TAVR (transcatheter aortic valve replacement) 02/18/2016   26 mm  Edwards Sapien 3 transcatheter heart valve placed via percutaneous right transfemoral approach    Past Surgical History:  Procedure Laterality Date  . [place]; ~ 2006   ; Ejection fraction is estimated at 45%  . CARDIAC CATHETERIZATION N/A 01/22/2016   Procedure: Right/Left Heart Cath and Coronary/Graft Angiography;  Surgeon: Linnae Rasool M [name], MD;  Location: [facility];  Service: Cardiovascular;  Laterality: N/A;  . CHOLECYSTECTOMY N/A 01/23/2014   Procedure: LAPAROSCOPIC CHOLECYSTECTOMY WITH INTRAOPERATIVE CHOLANGIOGRAM ;  Surgeon: Fanny Skates, MD;  Location: [facility];  Service: General;  Laterality: N/A;  . COLONOSCOPY W/ POLYPECTOMY    .  CORONARY ANGIOPLASTY WITH STENT PLACEMENT  03/13/2014   "1"  . CORONARY ARTERY BYPASS GRAFT  1996   "CABG X 7"  . CYSTOSCOPY WITH STENT PLACEMENT    . EP IMPLANTABLE DEVICE N/A 12/17/2014   Procedure: BiV ICD Insertion CRT-D;  Surgeon: Evans Lance, MD;  Location: [facility];  Service: Cardiovascular;  Laterality: N/A;  . LEFT HEART CATHETERIZATION WITH CORONARY/GRAFT ANGIOGRAM N/A 03/13/2014   Procedure: LEFT HEART CATHETERIZATION WITH Beatrix Fetters;  Surgeon: Aidaly Cordner M [name], MD;  Location[facility];  Service: Cardiovascular;  Laterality: N/A;  . MULTIPLE EXTRACTIONS WITH ALVEOLOPLASTY N/A 02/07/2016   Procedure: Extraction of tooth #'s 1,2,3,14,15, 18, 20, 29,30,31 with alveoloplasty and dental cleaning of teeth.;  Surgeon: Lenn Cal, DDS;  Location: [facility];  Service: Oral Surgery;  Laterality: N/A;  . TEE WITHOUT CARDIOVERSION N/A 02/18/2016   Procedure: TRANSESOPHAGEAL ECHOCARDIOGRAM (TEE);  Surgeon: Sherren Mocha, MD;  Location: [facility];  Service: Open Heart Surgery;  Laterality: N/A;  . TONSILLECTOMY  ~ 1950  . TRANSCATHETER AORTIC VALVE REPLACEMENT, TRANSFEMORAL N/A 02/18/2016   Procedure: TRANSCATHETER AORTIC VALVE REPLACEMENT, TRANSFEMORAL;  Surgeon: Sherren Mocha, MD;  Location: [facility];  Service:  Open Heart Surgery;  Laterality: N/A;    Social History   Tobacco Use  Smoking Status Former Smoker  . Packs/day: 0.75  . Years: 15.00  . Pack years: 11.25  . Types: Cigarettes  . Quit date: 01/12/1997  . Years since quitting: 22.3  Smokeless Tobacco Never Used  Tobacco Comment   3/4 pack per day. smoked 15 years off and on    Social History   Substance and Sexual Activity  Alcohol Use No  . Alcohol/week: 4.0 standard drinks  . Types: 4 Cans of beer per week    Family History  Problem Relation Age of Onset  . Heart attack Father   . Cancer Sister        Uterine Cancer    Review of Systems: As noted in HPI.  All other systems were reviewed and are negative.  Physical Exam: BP 118/62   Pulse 60   Ht 5\' 10"  (1.778 m)   Wt 213 lb 6.4 oz (96.8 kg)   SpO2 96%   BMI 30.62 kg/m  GENERAL:  Well appearing WM in NAD HEENT:  PERRL, EOMI, sclera are clear. Oropharynx is clear. NECK:  No jugular venous distention, carotid upstroke brisk and symmetric, no bruits, no thyromegaly or adenopathy LUNGS:  Clear to auscultation bilaterally CHEST:  Unremarkable HEART:  RRR,  PMI not displaced or sustained,S1 and S2 within normal limits, no S3, no S4: no clicks, no rubs, gr 1/6 systolic murmur RUSB. ABD:  Soft, nontender. BS +, no masses or bruits. No hepatomegaly, no splenomegaly EXT:  2 + pulses throughout, no edema, no cyanosis no clubbing SKIN:  Warm and dry.  No rashes NEURO:  Alert and oriented x 3. Cranial nerves II through XII intact. PSYCH:  Cognitively intact    LABORATORY DATA:   Lab Results  Component Value Date   WBC 5.2 05/02/2019   HGB 13.6 05/02/2019   HCT 41.6 05/02/2019   PLT 159 05/02/2019   GLUCOSE 133 (H) 05/02/2019   CHOL 150 03/12/2015   TRIG 105 03/12/2015   HDL 38 (L) 03/12/2015   LDLCALC 91 03/12/2015   ALT 37 02/14/2016   AST 39 02/14/2016   NA 140 05/02/2019   K 4.0 05/02/2019   CL 104 05/02/2019   CREATININE 1.29 (H)  05/02/2019   BUN 27  (H) 05/02/2019   CO2 26 05/02/2019   TSH 1.572 12/16/2014   INR 2.4 09/16/2016   HGBA1C 6.0 (H) 02/14/2016     Echo dated 03/13/16: Study Conclusions  - Left ventricle: The cavity size was normal. There was moderate   concentric hypertrophy. Systolic function was mildly to   moderately reduced. The estimated ejection fraction was in the   range of 40% to 45%. Hypokinesis of the anterolateral and   inferolateral myocardium; consistent with ischemia in the   distribution of the left circumflex coronary artery. Doppler   parameters are consistent with abnormal left ventricular   relaxation (grade 1 diastolic dysfunction). Doppler parameters   are consistent with elevated mean left atrial filling pressure. - Ventricular septum: Septal motion showed paradox. These changes   are consistent with a left bundle branch block. - Aortic valve: A stent-valve bioprosthesis (TAVR) was present and   functioning normally. - Left atrium: The atrium was severely dilated. - Right ventricle: The cavity size was mildly dilated.  Impressions:  - Aortic valve gradients are similar to the 02/18/2016 study.  ------------------------------------------------------------------- Labs, prior tests, procedures, and surgery: Status post TAVR (26 mm Edwards Sapien 3 Transcatheter Bioprosthesis model # [id number]; serial # [id number]).  Coronary artery bypass grafting.  Echo 02/17/17: Study Conclusions  - Left ventricle: The cavity size was normal. Wall thickness was   increased in a pattern of mild LVH. Systolic function was mildly   to moderately reduced. The estimated ejection fraction was in the   range of 40% to 45%. Diffuse hypokinesis. Doppler parameters are   consistent with abnormal left ventricular relaxation (grade 1   diastolic dysfunction). - Aortic valve: Bioprosthetic aortic valve s/p TAVR. Trivial   perivalvular aortic regurgitation. No significant stenosis. Mean   gradient (S): 15 mm Hg. -  Mitral valve: There was no significant regurgitation. - Left atrium: The atrium was mildly to moderately dilated. - Right ventricle: The cavity size was mildly dilated. Pacer wire   or catheter noted in right ventricle. Systolic function was   mildly reduced. - Right atrium: The atrium was mildly dilated. - Tricuspid valve: Peak RV-RA gradient (S): 25 mm Hg. - Systemic veins: The IVC was not visualized.   Assessment / Plan: 1. Coronary disease with remote coronary bypass surgery in 1996. Cath in March 2016 showed high grade disease in SVG to OM. s/p successful PCI with DES.  Native vessels all occluded and he is graft dependent. Repeat cath in January 2018 showed patent grafts. He has had refractory angina mostly at night but is now having symptoms with exertion. I think the only way we could evaluate this is with repeat cardiac cath. Myoview will not be very helpful since abnormal before. He is on Imdur 120 mg daily and Toprol XL. He is doing better on Ranexa but still takes Ntg frequently. He is comfortable with current status. If symptoms worsen cardiac cath would be needed. He is adamant that he will not have another bypass operation.  2.  Severe aortic stenosis. S/p TAVR with #23 Edwards Sapien valve. Stable by exam  3. Chronic systolic CHF. Asymptomatic. On ACEi and Toprol XL. EF improved to 40-45%. Continue Rx.   4. Syncope-  Now s/p BiV ICD. Paced.  5. Obesity.   6. Hyperlipidemia. On lipitor. Declines getting labs today  7. LBBB   Follow up in 6 months

## 2019-05-02 NOTE — Discharge Instructions (Signed)
Follow up with your doctor in the office, please return for repeat episode, or worsening chest pain.

## 2019-05-02 NOTE — ED Provider Notes (Signed)
[facility] EMERGENCY DEPARTMENT Provider Note   CSN: [id number] Arrival date & time: 05/02/19  2044     History Chief Complaint  Patient presents with  . Chest Pain    ASANTI VILLA is a 74 y.o. male.  74 yo M with a chief complaint of feeling unwell.  The patient states that approximately an hour ago he was watching TV and started feeling a bit nauseated and confused and generally weak.  Starting having chest pain that was typical of his anginal symptoms.  He took some nitroglycerin and told his wife that he felt bad eventually 911 was called.  Was found to be very pale per EMS was given 324 of aspirin and 2 more nitros with complete resolution of his symptoms.  The patient has never had any thing happen like this before.  States that he was at his normal level of health prior to the event.  Has been having chest pain almost daily and typically will take 1-2 nitros a day.  This is not significantly changed for him.  Denies history of heart failure.  Denies cough congestion or fever.  Denies nausea vomiting or diarrhea.  Denies dark stool or blood in stool.  The history is provided by the patient.  Illness Severity:  Moderate Onset quality:  Sudden Duration:  30 minutes Timing:  Rare Progression:  Resolved Chronicity:  New Associated symptoms: chest pain, fatigue and nausea   Associated symptoms: no abdominal pain, no congestion, no diarrhea, no fever, no headaches, no myalgias, no rash, no shortness of breath and no vomiting        Past Medical History:  Diagnosis Date  . Aortic stenosis   . Barrett's esophageal ulceration   . Chronic systolic CHF (congestive heart failure) ([place])   . Coronary artery disease    a. CABG 1996: LIMA graft to the LAD and diagonal, sequential vein graft to the acute marginal, PDA, and posterior lateral branches of the right coronary, sequential saphenous vein graft to the obtuse marginal vessel and distal circumflex. b. Cath  2007 patent grafts. c. s/p DES to SVG-OM2 in 03/2014.  Marland Kitchen Coronary artery disease involving autologous vein coronary bypass graft with angina pectoris ([place]) 03/13/2014   PCI using DES in SVG to LCx system  . Dental caries    pre-heart valve surgery protocol  . Gastroesophageal reflux disease   . History of erectile dysfunction   . History of hiatal hernia   . History of kidney stones   . History of obesity   . History of prostatitis   . Hypercholesterolemia   . Hypertension   . Kidney stones   . LBBB (left bundle branch block)   . Nodule of kidney 02/04/2016   Incidental 32mm nodule upper pole right kidney noted on CT angiogram - MRI with and without gadolinium contrast recommened in 6 months  . OSA (obstructive sleep apnea)    "suppose to wear mask; I don't" (03/13/2014)  . PVC's (premature ventricular contractions)   . S/P CABG x 7 01/07/1995   LIMA to LAD-diagonal, SVG to OM-LCx, SVG to AM-PD-RPL  . S/P TAVR (transcatheter aortic valve replacement) 02/18/2016   26 mm Edwards Sapien 3 transcatheter heart valve placed via percutaneous right transfemoral approach    Patient Active Problem List   Diagnosis Date Noted  . S/P TAVR (transcatheter aortic valve replacement) 02/18/2016  . Severe aortic stenosis 02/18/2016  . Chronic periodontitis 02/05/2016  . Nodule of kidney 02/04/2016  . Coronary  artery disease involving autologous artery coronary bypass graft with angina pectoris ([place])   . Coronary artery disease involving native heart with angina pectoris ([place])   . Syncope 12/24/2014  . Bilateral carotid bruits 12/16/2014  . Abdominal bruit 12/16/2014  . PVC's (premature ventricular contractions)   . Chronic systolic CHF (congestive heart failure) ([place])   . Syncope and collapse versus seizures 12/15/2014  . Chronic chest pain 12/15/2014  . Aortic stenosis   . Coronary artery disease involving coronary bypass graft of native heart with angina pectoris ([place]) 03/13/2014  . Angina  pectoris ([place]) 03/13/2014  . Atypical chest pain 02/21/2014  . Gallstones 05/16/2013  . Barrett's esophagus 05/16/2013  . History of nephrolithiasis 05/16/2013  . LBBB (left bundle branch block) 02/11/2011  . Coronary artery disease   . Hypercholesterolemia   . History of hypertension   . Gastroesophageal reflux disease   . History of obesity   . S/P CABG x 7 01/07/1995    Past Surgical History:  Procedure Laterality Date  . [place]; ~ 2006   ; Ejection fraction is estimated at 45%  . CARDIAC CATHETERIZATION N/A 01/22/2016   Procedure: Right/Left Heart Cath and Coronary/Graft Angiography;  Surgeon: [name] [name], MD;  Location: [place];  Service: Cardiovascular;  Laterality: N/A;  . CHOLECYSTECTOMY N/A 01/23/2014   Procedure: LAPAROSCOPIC CHOLECYSTECTOMY WITH INTRAOPERATIVE CHOLANGIOGRAM ;  Surgeon: Fanny Skates, MD;  Location: [place];  Service: General;  Laterality: N/A;  . COLONOSCOPY W/ POLYPECTOMY    . CORONARY ANGIOPLASTY WITH STENT PLACEMENT  03/13/2014   "1"  . CORONARY ARTERY BYPASS GRAFT  1996   "CABG X 7"  . CYSTOSCOPY WITH STENT PLACEMENT    . EP IMPLANTABLE DEVICE N/A 12/17/2014   Procedure: BiV ICD Insertion CRT-D;  Surgeon: Evans Lance, MD;  Location: [place] [facility];  Service: Cardiovascular;  Laterality: N/A;  . LEFT HEART CATHETERIZATION WITH CORONARY/GRAFT ANGIOGRAM N/A 03/13/2014   Procedure: LEFT HEART CATHETERIZATION WITH Beatrix Fetters;  Surgeon: [name] [name], MD;  Location[facility] LAB;  Service: Cardiovascular;  Laterality: N/A;  . MULTIPLE EXTRACTIONS WITH ALVEOLOPLASTY N/A 02/07/2016   Procedure: Extraction of tooth #'s 1,2,3,14,15, 18, 20, 29,30,31 with alveoloplasty and dental cleaning of teeth.;  Surgeon: Lenn Cal, DDS;  Location: [place];  Service: Oral Surgery;  Laterality: N/A;  . TEE WITHOUT CARDIOVERSION N/A 02/18/2016   Procedure: TRANSESOPHAGEAL ECHOCARDIOGRAM (TEE);  Surgeon: Sherren Mocha, MD;   Location: [place];  Service: Open Heart Surgery;  Laterality: N/A;  . TONSILLECTOMY  ~ 1950  . TRANSCATHETER AORTIC VALVE REPLACEMENT, TRANSFEMORAL N/A 02/18/2016   Procedure: TRANSCATHETER AORTIC VALVE REPLACEMENT, TRANSFEMORAL;  Surgeon: Sherren Mocha, MD;  Location: [place];  Service: Open Heart Surgery;  Laterality: N/A;       Family History  Problem Relation Age of Onset  . Heart attack Father   . Cancer Sister        Uterine Cancer    Social History   Tobacco Use  . Smoking status: Former Smoker    Packs/day: 0.75    Years: 15.00    Pack years: 11.25    Types: Cigarettes    Quit date: 01/12/1997    Years since quitting: 22.3  . Smokeless tobacco: Never Used  . Tobacco comment: 3/4 pack per day. smoked 15 years off and on  Substance Use Topics  . Alcohol use: No    Alcohol/week: 4.0 standard drinks    Types: 4 Cans  of beer per week  . Drug use: No    Home Medications Prior to Admission medications   Medication Sig Start Date End Date Taking? Authorizing Provider  amoxicillin (AMOXIL) 500 MG tablet Take 2g (four 500 mg tablets) one hour prior to dental procedures. Patient taking differently: Take 2,000 mg by mouth See admin instructions. Take 2,000 mg by mouth one hour prior to dental procedures 02/17/17  Yes Eileen [place], PA-C  Ascorbic Acid (VITAMIN C) 1000 MG tablet Take 1,000 mg by mouth daily.   Yes [provider]  aspirin EC 81 MG EC tablet Take 1 tablet (81 mg total) by mouth daily. 03/14/14  Yes Barrett, Evelene Croon, PA-C  atorvastatin (LIPITOR) 80 MG tablet TAKE ONE TABLET BY MOUTH DAILY Patient taking differently: Take 80 mg by mouth daily.  03/30/19  Yes Kilroy, Doreene [place], PA-C  Cholecalciferol (VITAMIN D-3) 5000 units TABS Take 5,000 Units by mouth daily.    Yes [provider]  finasteride (PROSCAR) 5 MG tablet Take 5 mg by mouth daily.   Yes [provider]  isosorbide mononitrate (IMDUR) 120 MG 24 hr tablet Take 1 tablet (120 mg total)  by mouth daily. 12/21/18  Yes [name], [name], MD  metoprolol succinate (TOPROL-XL) 50 MG 24 hr tablet TAKE ONE TABLET BY MOUTH DAILY WITH OR IMMEDIATELY FOLLOWING A MEAL Patient taking differently: Take 50 mg by mouth daily.  04/13/19  Yes [name], [name], MD  nitroGLYCERIN (NITROSTAT) 0.4 MG SL tablet Take one sl PRN chest pain Patient taking differently: Place 0.4 mg under the tongue every 5 (five) minutes as needed for chest pain (OR AS OTHERWISE DIRECTED BY THE PRESCRIBER).  12/21/18  Yes [name], [name], MD  oxybutynin (DITROPAN) 5 MG tablet Take 5 mg by mouth daily.  07/04/12  Yes [provider]  pantoprazole (PROTONIX) 40 MG tablet TAKE ONE TABLET BY MOUTH DAILY Patient taking differently: Take 40 mg by mouth daily before breakfast.  01/02/19  Yes [name], [name], MD  ramipril (ALTACE) 5 MG capsule TAKE ONE CAPSULE BY MOUTH DAILY Patient taking differently: Take 5 mg by mouth daily.  03/20/19  Yes Minus Breeding, MD  ranolazine (RANEXA) 500 MG 12 hr tablet Take 1 tablet (500 mg total) by mouth 2 (two) times daily. Patient taking differently: Take 250 mg by mouth daily with supper.  04/06/19  Yes [name], [name], MD  valACYclovir (VALTREX) 500 MG tablet TAKE ONE TABLET BY MOUTH DAILY AS NEEDED FOR OUTBREAK Patient not taking: Reported on 05/02/2019 12/03/17   [name], [name], MD    Allergies    Ranexa [ranolazine]  Review of Systems   Review of Systems  Constitutional: Positive for activity change (confusion) and fatigue. Negative for chills and fever.  HENT: Negative for congestion and facial swelling.   Eyes: Negative for discharge and visual disturbance.  Respiratory: Negative for shortness of breath.   Cardiovascular: Positive for chest pain. Negative for palpitations.  Gastrointestinal: Positive for nausea. Negative for abdominal pain, diarrhea and vomiting.  Musculoskeletal: Negative for arthralgias and myalgias.  Skin: Negative for color change and rash.  Neurological:  Negative for tremors, syncope and headaches.  Psychiatric/Behavioral: Negative for confusion and dysphoric mood.    Physical Exam Updated Vital Signs BP (!) 158/71   Pulse (!) 59   Temp 98.1 F (36.7 C) (Oral)   Resp 15   Ht 5\' 10"  (1.778 m)   Wt 90.7 kg   SpO2 96%   BMI 28.70  kg/m   Physical Exam Vitals and nursing note reviewed.  Constitutional:      Appearance: He is well-developed.  HENT:     Head: Normocephalic and atraumatic.  Eyes:     Pupils: Pupils are equal, round, and reactive to light.  Neck:     Vascular: No JVD.  Cardiovascular:     Rate and Rhythm: Normal rate and regular rhythm.     Heart sounds: No murmur. No friction rub. No gallop.   Pulmonary:     Effort: No respiratory distress.     Breath sounds: No wheezing.  Abdominal:     General: There is no distension.     Tenderness: There is no abdominal tenderness. There is no guarding or rebound.  Musculoskeletal:        General: Normal range of motion.     Cervical back: Normal range of motion and neck supple.  Skin:    Coloration: Skin is not pale.     Findings: No rash.  Neurological:     Mental Status: He is alert and oriented to person, place, and time.  Psychiatric:        Behavior: Behavior normal.     ED Results / Procedures / Treatments   Labs (all labs ordered are listed, but only abnormal results are displayed) Labs Reviewed  BASIC METABOLIC PANEL - Abnormal; Notable for the following components:      Result Value   Glucose, Bld 133 (*)    BUN 27 (*)    Creatinine, Ser 1.29 (*)    GFR calc non Af Amer 53 (*)    All other components within normal limits  CBG MONITORING, ED - Abnormal; Notable for the following components:   Glucose-Capillary 121 (*)    All other components within normal limits  CBC WITH DIFFERENTIAL/PLATELET  MAGNESIUM  TROPONIN I (HIGH SENSITIVITY)  TROPONIN I (HIGH SENSITIVITY)    EKG EKG Interpretation  Date/Time:  Tuesday May 02 2019 23:16:40  EDT Ventricular Rate:  60 PR Interval:    QRS Duration: 148 QT Interval:  490 QTC Calculation: 490 R Axis:   -64 Text Interpretation: Sinus rhythm Short PR interval Nonspecific IVCD with LAD LVH with secondary repolarization abnormality Inferior infarct, old Anterior infarct, old No significant change since last tracing Confirmed by Deno Etienne [phone]) on 05/02/2019 11:17:46 PM   Radiology DG Chest 2 View  Result Date: 05/02/2019 CLINICAL DATA:  Weakness, chest pain EXAM: CHEST - 2 VIEW COMPARISON:  02/19/2016 FINDINGS: Prior CABG and valve replacement. Left AICD remains in place, unchanged. Cardiomegaly. No confluent opacities or effusions. No acute bony abnormality. IMPRESSION: No active cardiopulmonary disease. Electronically Signed   By: Rolm Baptise M.D.   On: 05/02/2019 21:19    Procedures Procedures (including critical care time)  Medications Ordered in ED Medications - No data to display  ED Course  I have reviewed the triage vital signs and the nursing notes.  Pertinent labs & imaging results that were available during my care of the patient were reviewed by me and considered in my medical decision making (see chart for details).    MDM Rules/Calculators/A&P                      74 yo M with a chief complaint of a events that occurred earlier today.  Felt like it could be a vasovagal event the patient suddenly felt unwell and had some chest discomfort and feeling fatigued short of breath and was  somewhat confused.  This resolved with time.  He was given nitroglycerin for his chest pain which is also resolved.  We will interrogate the patient's pacemaker.  EMS on arrival had found him to be in what looks like bigeminy.  Will obtain lab work delta troponin reassess.  Pacemaker interrogation without any arrhythmias since 2018.  Delta troponin is negative.  Patient continues to be asymptomatic.  I still feel this is most likely vasovagal.  Given that the patient has an EF of 30% I  think with his pacemaker and should show if he had had an arrhythmia or a defibrillation.  We will have him call his cardiologist tomorrow and have him schedule outpatient follow-up.  He is already scheduled to follow-up in 8 days time.  11:46 PM:  I have discussed the diagnosis/risks/treatment options with the patient and family and believe the pt to be eligible for discharge home to follow-up with Cards. We also discussed returning to the ED immediately if new or worsening sx occur. We discussed the sx which are most concerning (e.g., repeat episode, worsening chest pain) that necessitate immediate return. Medications administered to the patient during their visit and any new prescriptions provided to the patient are listed below.  Medications given during this visit Medications - No data to display   The patient appears reasonably screen and/or stabilized for discharge and I doubt any other medical condition or other [facility] requiring further screening, evaluation, or treatment in the ED at this time prior to discharge.     Final Clinical Impression(s) / ED Diagnoses Final diagnoses:  Near syncope    Rx / DC Orders ED Discharge Orders    None       Deno Etienne, DO 05/02/19 2346

## 2019-05-02 NOTE — ED Triage Notes (Signed)
Pt here from home via [facility] for sudden weakness, nausea, and cp that started at 1900. Pt has hx of CABG, stents, MI, and pacemaker. Pt took 1 SL nitro. EMS gave 324mg  aspirin and another SL nitro, pt endorsed relief. Pt currently denies cp, states he feels fine. AOx4, VSS.

## 2019-05-10 ENCOUNTER — Other Ambulatory Visit: Payer: Self-pay

## 2019-05-10 ENCOUNTER — Ambulatory Visit: Payer: Medicare HMO | Admitting: Cardiology

## 2019-05-10 ENCOUNTER — Encounter: Payer: Self-pay | Admitting: Cardiology

## 2019-05-10 VITALS — BP 118/62 | HR 60 | Ht 70.0 in | Wt 213.4 lb

## 2019-05-10 DIAGNOSIS — Z8679 Personal history of other diseases of the circulatory system: Secondary | ICD-10-CM | POA: Diagnosis not present

## 2019-05-10 DIAGNOSIS — I25729 Atherosclerosis of autologous artery coronary artery bypass graft(s) with unspecified angina pectoris: Secondary | ICD-10-CM | POA: Diagnosis not present

## 2019-05-10 DIAGNOSIS — Z952 Presence of prosthetic heart valve: Secondary | ICD-10-CM | POA: Diagnosis not present

## 2019-05-10 DIAGNOSIS — I5022 Chronic systolic (congestive) heart failure: Secondary | ICD-10-CM

## 2019-05-10 DIAGNOSIS — I447 Left bundle-branch block, unspecified: Secondary | ICD-10-CM

## 2019-05-10 MED ORDER — NITROGLYCERIN 0.4 MG SL SUBL: SUBLINGUAL_TABLET | SUBLINGUAL | 5 refills | Status: DC

## 2019-05-11 ENCOUNTER — Ambulatory Visit (INDEPENDENT_AMBULATORY_CARE_PROVIDER_SITE_OTHER): Payer: Medicare HMO | Admitting: *Deleted

## 2019-05-11 DIAGNOSIS — I5022 Chronic systolic (congestive) heart failure: Secondary | ICD-10-CM

## 2019-05-12 LAB — CUP PACEART REMOTE DEVICE CHECK
Battery Remaining Longevity: 27 mo
Battery Voltage: 2.93 V
Brady Statistic AP VP Percent: 58.37 %
Brady Statistic AP VS Percent: 0.19 %
Brady Statistic AS VP Percent: 41.34 %
Brady Statistic AS VS Percent: 0.1 %
Brady Statistic RA Percent Paced: 58.29 %
Brady Statistic RV Percent Paced: 99.37 %
Date Time Interrogation Session: 20210429224231
HighPow Impedance: 71 Ohm
Implantable Lead Implant Date: 20161205
Implantable Lead Implant Date: 20161205
Implantable Lead Implant Date: 20161205
Implantable Lead Location: 753858
Implantable Lead Location: 753859
Implantable Lead Location: 753860
Implantable Lead Model: 4598
Implantable Lead Model: 5076
Implantable Lead Model: 6935
Implantable Pulse Generator Implant Date: 20161205
Lead Channel Impedance Value: 342 Ohm
Lead Channel Impedance Value: 342 Ohm
Lead Channel Impedance Value: 342 Ohm
Lead Channel Impedance Value: 399 Ohm
Lead Channel Impedance Value: 418 Ohm
Lead Channel Impedance Value: 418 Ohm
Lead Channel Impedance Value: 418 Ohm
Lead Channel Impedance Value: 456 Ohm
Lead Channel Impedance Value: 589 Ohm
Lead Channel Impedance Value: 589 Ohm
Lead Channel Impedance Value: 646 Ohm
Lead Channel Impedance Value: 665 Ohm
Lead Channel Impedance Value: 665 Ohm
Lead Channel Pacing Threshold Amplitude: 0.625 V
Lead Channel Pacing Threshold Amplitude: 0.625 V
Lead Channel Pacing Threshold Amplitude: 0.75 V
Lead Channel Pacing Threshold Pulse Width: 0.4 ms
Lead Channel Pacing Threshold Pulse Width: 0.4 ms
Lead Channel Pacing Threshold Pulse Width: 0.4 ms
Lead Channel Sensing Intrinsic Amplitude: 1.875 mV
Lead Channel Sensing Intrinsic Amplitude: 1.875 mV
Lead Channel Sensing Intrinsic Amplitude: 17 mV
Lead Channel Sensing Intrinsic Amplitude: 17 mV
Lead Channel Setting Pacing Amplitude: 1.75 V
Lead Channel Setting Pacing Amplitude: 2 V
Lead Channel Setting Pacing Amplitude: 2.5 V
Lead Channel Setting Pacing Pulse Width: 0.4 ms
Lead Channel Setting Pacing Pulse Width: 0.4 ms
Lead Channel Setting Sensing Sensitivity: 0.3 mV

## 2019-05-12 NOTE — Progress Notes (Signed)
ICD Remote  

## 2019-06-12 ENCOUNTER — Other Ambulatory Visit: Payer: Self-pay | Admitting: Cardiology

## 2019-06-14 ENCOUNTER — Telehealth: Payer: Self-pay | Admitting: Cardiology

## 2019-06-14 NOTE — Telephone Encounter (Signed)
Spoke to patient he stated he has not been getting NTG # 200 tablets as Dr.Jordan ordered.Spoke to pharmacist at [facility].She stated he got 100 tablets twice this month due to not enough quantity.She will make a note he is to receive 200 tablets.

## 2019-06-14 NOTE — Telephone Encounter (Signed)
New Message    Pt c/o medication issue:  1. Name of Medication: nitroGLYCERIN (NITROSTAT) 0.4 MG SL tablet  2. How are you currently taking this medication (dosage and times per day)? 0.4mg  as needed   3. Are you having a reaction (difficulty breathing--STAT)? No   4. What is your medication issue? Pt is calling and says he got the medication from the pharmacy and says the directions are suppose to be different. He would like for the nurse to call    Please call

## 2019-06-29 ENCOUNTER — Telehealth: Payer: Self-pay | Admitting: Cardiology

## 2019-06-29 MED ORDER — NITROGLYCERIN 0.4 MG SL SUBL: SUBLINGUAL_TABLET | SUBLINGUAL | 6 refills | Status: DC

## 2019-06-29 NOTE — Telephone Encounter (Signed)
Received a call from patient he stated directions on NTG need to say may take 5 to 7 tablets per day in order for insurance to pay for 200 tablets.Prescription sent to pharmacy.

## 2019-06-29 NOTE — Telephone Encounter (Signed)
Pt c/o medication issue:  1. Name of Medication: nitroGLYCERIN (NITROSTAT) 0.4 MG SL tablet  2. How are you currently taking this medication (dosage and times per day)?   3. Are you having a reaction (difficulty breathing--STAT)? no  4. What is your medication issue? Pt wants to speak with Malachy Mood about the number of pills he requested

## 2019-07-01 ENCOUNTER — Other Ambulatory Visit: Payer: Self-pay | Admitting: Cardiology

## 2019-07-01 DIAGNOSIS — I350 Nonrheumatic aortic (valve) stenosis: Secondary | ICD-10-CM

## 2019-07-22 ENCOUNTER — Other Ambulatory Visit: Payer: Self-pay | Admitting: Cardiology

## 2019-07-22 DIAGNOSIS — I5022 Chronic systolic (congestive) heart failure: Secondary | ICD-10-CM

## 2019-07-22 DIAGNOSIS — I350 Nonrheumatic aortic (valve) stenosis: Secondary | ICD-10-CM

## 2019-08-10 ENCOUNTER — Ambulatory Visit (INDEPENDENT_AMBULATORY_CARE_PROVIDER_SITE_OTHER): Payer: Medicare HMO | Admitting: *Deleted

## 2019-08-10 DIAGNOSIS — I5022 Chronic systolic (congestive) heart failure: Secondary | ICD-10-CM | POA: Diagnosis not present

## 2019-08-10 DIAGNOSIS — I447 Left bundle-branch block, unspecified: Secondary | ICD-10-CM

## 2019-08-10 LAB — CUP PACEART REMOTE DEVICE CHECK
Battery Remaining Longevity: 24 mo
Battery Voltage: 2.93 V
Brady Statistic AP VP Percent: 69.57 %
Brady Statistic AP VS Percent: 0.1 %
Brady Statistic AS VP Percent: 30.31 %
Brady Statistic AS VS Percent: 0.03 %
Brady Statistic RA Percent Paced: 69.48 %
Brady Statistic RV Percent Paced: 99.58 %
Date Time Interrogation Session: 20210729033422
HighPow Impedance: 65 Ohm
Implantable Lead Implant Date: 20161205
Implantable Lead Implant Date: 20161205
Implantable Lead Implant Date: 20161205
Implantable Lead Location: 753858
Implantable Lead Location: 753859
Implantable Lead Location: 753860
Implantable Lead Model: 4598
Implantable Lead Model: 5076
Implantable Lead Model: 6935
Implantable Pulse Generator Implant Date: 20161205
Lead Channel Impedance Value: 342 Ohm
Lead Channel Impedance Value: 342 Ohm
Lead Channel Impedance Value: 342 Ohm
Lead Channel Impedance Value: 361 Ohm
Lead Channel Impedance Value: 361 Ohm
Lead Channel Impedance Value: 399 Ohm
Lead Channel Impedance Value: 399 Ohm
Lead Channel Impedance Value: 456 Ohm
Lead Channel Impedance Value: 589 Ohm
Lead Channel Impedance Value: 589 Ohm
Lead Channel Impedance Value: 646 Ohm
Lead Channel Impedance Value: 646 Ohm
Lead Channel Impedance Value: 646 Ohm
Lead Channel Pacing Threshold Amplitude: 0.5 V
Lead Channel Pacing Threshold Amplitude: 0.625 V
Lead Channel Pacing Threshold Amplitude: 0.875 V
Lead Channel Pacing Threshold Pulse Width: 0.4 ms
Lead Channel Pacing Threshold Pulse Width: 0.4 ms
Lead Channel Pacing Threshold Pulse Width: 0.4 ms
Lead Channel Sensing Intrinsic Amplitude: 1.375 mV
Lead Channel Sensing Intrinsic Amplitude: 1.375 mV
Lead Channel Sensing Intrinsic Amplitude: 17 mV
Lead Channel Sensing Intrinsic Amplitude: 17 mV
Lead Channel Setting Pacing Amplitude: 2 V
Lead Channel Setting Pacing Amplitude: 2 V
Lead Channel Setting Pacing Amplitude: 2.5 V
Lead Channel Setting Pacing Pulse Width: 0.4 ms
Lead Channel Setting Pacing Pulse Width: 0.4 ms
Lead Channel Setting Sensing Sensitivity: 0.3 mV

## 2019-08-11 ENCOUNTER — Telehealth: Payer: Self-pay | Admitting: Cardiology

## 2019-08-11 NOTE — Telephone Encounter (Signed)
Pt c/o medication issue:  1. Name of Medication: isosorbide mononitrate (IMDUR) 120 MG 24 hr tablet  2. How are you currently taking this medication (dosage and times per day)? Not currently taking medication  3. Are you having a reaction (difficulty breathing--STAT)? No   4. What is your medication issue? Daxtin is calling wanting to know if he is supposed to be taking this medication. Please advise.

## 2019-08-11 NOTE — Telephone Encounter (Signed)
Left message to call back  

## 2019-08-11 NOTE — Telephone Encounter (Signed)
Spoke to pt who was questioning whether he should be taking Imdur 120 mg daily. Pt report he found the medication in his "do not take" pile and not sure why he placed it there. Nurse informed that per last office note Dr. [name] documented to continue taking.  Nurse questioned since stopping medication has he experienced any chest pain. Pt report he has noticed he's taken nitro more than usual.   Will route to MD for recommendation to restart medication

## 2019-08-11 NOTE — Telephone Encounter (Signed)
He should continue to take Imdur 120 mg daily. I don't know why it was in his " do not take pile"  [name] [name] MD, [facility]

## 2019-08-14 NOTE — Progress Notes (Signed)
Remote ICD transmission.   

## 2019-08-15 MED ORDER — ISOSORBIDE MONONITRATE ER 120 MG PO TB24: 120.0000 mg | ORAL_TABLET | Freq: Every day | ORAL | 3 refills | Status: DC

## 2019-08-15 NOTE — Telephone Encounter (Signed)
Spoke to patient Dr.Jordan's advice given.Isosorbide 120 mg prescription sent to pharmacy.

## 2019-10-01 DIAGNOSIS — H43819 Vitreous degeneration, unspecified eye: Secondary | ICD-10-CM | POA: Diagnosis not present

## 2019-10-06 ENCOUNTER — Telehealth: Payer: Self-pay | Admitting: Cardiology

## 2019-10-06 NOTE — Telephone Encounter (Signed)
Patient called stating he was returning a call to [place]. Lars Mage stated Malachy Mood had already spoken with him.

## 2019-11-06 ENCOUNTER — Telehealth: Payer: Self-pay

## 2019-11-06 ENCOUNTER — Telehealth (INDEPENDENT_AMBULATORY_CARE_PROVIDER_SITE_OTHER): Payer: Medicare HMO | Admitting: Physician Assistant

## 2019-11-06 ENCOUNTER — Encounter: Payer: Self-pay | Admitting: Physician Assistant

## 2019-11-06 VITALS — Ht 70.0 in | Wt 200.0 lb

## 2019-11-06 DIAGNOSIS — E785 Hyperlipidemia, unspecified: Secondary | ICD-10-CM

## 2019-11-06 DIAGNOSIS — Z95810 Presence of automatic (implantable) cardiac defibrillator: Secondary | ICD-10-CM

## 2019-11-06 DIAGNOSIS — Z952 Presence of prosthetic heart valve: Secondary | ICD-10-CM | POA: Diagnosis not present

## 2019-11-06 DIAGNOSIS — I10 Essential (primary) hypertension: Secondary | ICD-10-CM

## 2019-11-06 DIAGNOSIS — I25810 Atherosclerosis of coronary artery bypass graft(s) without angina pectoris: Secondary | ICD-10-CM

## 2019-11-06 NOTE — Patient Instructions (Signed)
Medication Instructions:  Your physician recommends that you continue on your current medications as directed. Please refer to the Current Medication list given to you today.  *If you need a refill on your cardiac medications before your next appointment, please call your pharmacy*  Lab Work: NONE ordered at this time of appointment   If you have labs (blood work) drawn today and your tests are completely normal, you will receive your results only by: . MyChart Message (if you have MyChart) OR . A paper copy in the mail If you have any lab test that is abnormal or we need to change your treatment, we will call you to review the results.  Testing/Procedures: NONE ordered at this time of appointment   Follow-Up: At [facility], you and your health needs are our priority.  As part of our continuing mission to provide you with exceptional heart care, we have created designated Provider Care Teams.  These Care Teams include your primary Cardiologist (physician) and Advanced Practice Providers (APPs -  Physician Assistants and Nurse Practitioners) who all work together to provide you with the care you need, when you need it.  We recommend signing up for the patient portal called "MyChart".  Sign up information is provided on this After Visit Summary.  MyChart is used to connect with patients for Virtual Visits (Telemedicine).  Patients are able to view lab/test results, encounter notes, upcoming appointments, etc.  Non-urgent messages can be sent to your provider as well.   To learn more about what you can do with MyChart, go to [email or web address].    Your next appointment:   6 month(s)  The format for your next appointment:   In Person  Provider:   Peter Jordan, MD  Other Instructions   

## 2019-11-06 NOTE — Progress Notes (Signed)
Virtual Visit via Telephone Note   This visit type was conducted due to national recommendations for restrictions regarding the COVID-19 Pandemic (e.g. social distancing) in an effort to limit this patient's exposure and mitigate transmission in our community.  Due to his co-morbid illnesses, this patient is at least at moderate risk for complications without adequate follow up.  This format is felt to be most appropriate for this patient at this time.  The patient did not have access to video technology/had technical difficulties with video requiring transitioning to audio format only (telephone).  All issues noted in this document were discussed and addressed.  No physical exam could be performed with this format.  Please refer to the patient's chart for his  consent to telehealth for [facility].    Date:  11/06/2019   ID:  [name], DOB Dec 04, 1945, MRN [id number] The patient was identified using 2 identifiers.  Patient Location: Home Provider Location: Office/Clinic  PCP:  Janeann Merl, MD  Cardiologist:  Peter [name], MD  Electrophysiologist:  None   Evaluation Performed:  Follow-Up Visit  Chief Complaint:  Follow up  History of Present Illness:    [name] is a 74 y.o. male with PMH of Barrett's esophagus, hypertension, hyperlipidemia, history of syncope s/p BiV ICD, obstructive sleep apnea, h/o AS s/p TAVR, CAD s/p CABG 1996 (LIMA-LAD-Diag, seq vein graft to acute marginal, PDA, and PLA, sequential SVG to OM and the distal left circumflex).  Cardiac catheterization in 2007 showed patent grafts.  In 2016, he had cardiac catheterization for chest pain, grafts again were patent however there was not a 90% stenosis of SVG to OM 2, this was stented with drug-eluting stent.  Despite intervention, he continued to have chest pain, CT was negative for PE, EGD performed by GI service revealed Barrett's esophagus without evidence of esophagitis.  He was admitted for syncope  in December 2016, EF 30 to 35% with moderate to severe aortic stenosis.  He also had left bundle branch block.  He underwent biventricular ICD placement by Dr. Lovena Le.  Dobutamine echo shows improvement of EF to 40 to 45%, increasing AV gradient.  He subsequently underwent TAVR on 02/18/2016 with a 26 mm Edwards SAPIEN 3 transcatheter heart valve.  Repeat echocardiogram obtained in March 2018 showed EF improved to 40 to 45%.  Due to recurrent anginal symptom, he was started on Ranexa, evening dose was reduced to help alleviate dizziness.  He was last seen by Dr. [name] on 05/10/2019, it was felt that if his chest pain still persist, may need repeat cardiac catheterization.  Patient presents today for virtual visit.  He has not had any further chest discomfort on the current therapy.  Instead of 500 a.m. and a 250 mg p.m. dose of Ranexa, he is actually taking 500 mg twice a day.  He has not had a lower any lower extremity edema.  He does have shortness of breath with more strenuous activity but not with every day activity.  Last lipid panel in the epic system was dating back to 2017, however his primary care provider Dr. Sabra Heck has been checking his cholesterol on a yearly basis.  Overall, he is doing well from cardiac perspective and can follow-up in 6 months.  The patient does not have symptoms concerning for COVID-19 infection (fever, chills, cough, or new shortness of breath).    Past Medical History:  Diagnosis Date  . Aortic stenosis   . Barrett's esophageal ulceration   . Chronic  systolic CHF (congestive heart failure) ([place])   . Coronary artery disease    a. CABG 1996: LIMA graft to the LAD and diagonal, sequential vein graft to the acute marginal, PDA, and posterior lateral branches of the right coronary, sequential saphenous vein graft to the obtuse marginal vessel and distal circumflex. b. Cath 2007 patent grafts. c. s/p DES to SVG-OM2 in 03/2014.  Marland Kitchen Coronary artery disease involving autologous  vein coronary bypass graft with angina pectoris ([place]) 03/13/2014   PCI using DES in SVG to LCx system  . Dental caries    pre-heart valve surgery protocol  . Gastroesophageal reflux disease   . History of erectile dysfunction   . History of hiatal hernia   . History of kidney stones   . History of obesity   . History of prostatitis   . Hypercholesterolemia   . Hypertension   . Kidney stones   . LBBB (left bundle branch block)   . Nodule of kidney 02/04/2016   Incidental 92mm nodule upper pole right kidney noted on CT angiogram - MRI with and without gadolinium contrast recommened in 6 months  . OSA (obstructive sleep apnea)    "suppose to wear mask; I don't" (03/13/2014)  . PVC's (premature ventricular contractions)   . S/P CABG x 7 01/07/1995   LIMA to LAD-diagonal, SVG to OM-LCx, SVG to AM-PD-RPL  . S/P TAVR (transcatheter aortic valve replacement) 02/18/2016   26 mm Edwards Sapien 3 transcatheter heart valve placed via percutaneous right transfemoral approach   Past Surgical History:  Procedure Laterality Date  . [place]; ~ 2006   ; Ejection fraction is estimated at 45%  . CARDIAC CATHETERIZATION N/A 01/22/2016   Procedure: Right/Left Heart Cath and Coronary/Graft Angiography;  Surgeon: [name] [name], MD;  Location: [place] [facility];  Service: Cardiovascular;  Laterality: N/A;  . CHOLECYSTECTOMY N/A 01/23/2014   Procedure: LAPAROSCOPIC CHOLECYSTECTOMY WITH INTRAOPERATIVE CHOLANGIOGRAM ;  Surgeon: Fanny Skates, MD;  Location: [place];  Service: General;  Laterality: N/A;  . COLONOSCOPY W/ POLYPECTOMY    . CORONARY ANGIOPLASTY WITH STENT PLACEMENT  03/13/2014   "1"  . CORONARY ARTERY BYPASS GRAFT  1996   "CABG X 7"  . CYSTOSCOPY WITH STENT PLACEMENT    . EP IMPLANTABLE DEVICE N/A 12/17/2014   Procedure: BiV ICD Insertion CRT-D;  Surgeon: Evans Lance, MD;  Location: [place];  Service: Cardiovascular;  Laterality: N/A;  . LEFT HEART CATHETERIZATION  WITH CORONARY/GRAFT ANGIOGRAM N/A 03/13/2014   Procedure: LEFT HEART CATHETERIZATION WITH Beatrix Fetters;  Surgeon: [name] [name], MD;  Location: [facility];  Service: Cardiovascular;  Laterality: N/A;  . MULTIPLE EXTRACTIONS WITH ALVEOLOPLASTY N/A 02/07/2016   Procedure: Extraction of tooth #'s 1,2,3,14,15, 18, 20, 29,30,31 with alveoloplasty and dental cleaning of teeth.;  Surgeon: Lenn Cal, DDS;  Location: [facility];  Service: Oral Surgery;  Laterality: N/A;  . TEE WITHOUT CARDIOVERSION N/A 02/18/2016   Procedure: TRANSESOPHAGEAL ECHOCARDIOGRAM (TEE);  Surgeon: Sherren Mocha, MD;  Location: [place];  Service: Open Heart Surgery;  Laterality: N/A;  . TONSILLECTOMY  ~ 1950  . TRANSCATHETER AORTIC VALVE REPLACEMENT, TRANSFEMORAL N/A 02/18/2016   Procedure: TRANSCATHETER AORTIC VALVE REPLACEMENT, TRANSFEMORAL;  Surgeon: Sherren Mocha, MD;  Location: [facility] [facility];  Service: Open Heart Surgery;  Laterality: N/A;     Current Meds  Medication Sig  . amoxicillin (AMOXIL) 500 MG tablet Take 2g (four 500 mg tablets) one hour prior to dental procedures.  . Ascorbic  Acid (VITAMIN C) 1000 MG tablet Take 1,000 mg by mouth daily.  Marland Kitchen aspirin EC 81 MG EC tablet Take 1 tablet (81 mg total) by mouth daily.  Marland Kitchen atorvastatin (LIPITOR) 80 MG tablet TAKE ONE TABLET BY MOUTH DAILY  . Cholecalciferol (VITAMIN D-3) 5000 units TABS Take 5,000 Units by mouth daily.   . finasteride (PROSCAR) 5 MG tablet Take 5 mg by mouth daily.  . isosorbide mononitrate (IMDUR) 120 MG 24 hr tablet Take 1 tablet (120 mg total) by mouth daily.  . metoprolol succinate (TOPROL-XL) 50 MG 24 hr tablet TAKE ONE TABLET BY MOUTH DAILY WITH OR IMMEDIATELY FOLLOWING A MEAL  . nitroGLYCERIN (NITROSTAT) 0.4 MG SL tablet May take 5 to 7 tablets per day  . oxybutynin (DITROPAN) 5 MG tablet Take 5 mg by mouth daily.   . pantoprazole (PROTONIX) 40 MG tablet TAKE ONE TABLET BY MOUTH DAILY  . ramipril (ALTACE) 5 MG capsule TAKE ONE CAPSULE BY MOUTH  DAILY  . ranolazine (RANEXA) 500 MG 12 hr tablet Take 750 mg by mouth daily. 500 mg in the AM, 250mg  in the PM  . valACYclovir (VALTREX) 500 MG tablet TAKE ONE TABLET BY MOUTH DAILY AS NEEDED FOR OUTBREAK     Allergies:   Ranexa [ranolazine]   Social History   Tobacco Use  . Smoking status: Former Smoker    Packs/day: 0.75    Years: 15.00    Pack years: 11.25    Types: Cigarettes    Quit date: 01/12/1997    Years since quitting: 22.8  . Smokeless tobacco: Never Used  . Tobacco comment: 3/4 pack per day. smoked 15 years off and on  Vaping Use  . Vaping Use: Never used  Substance Use Topics  . Alcohol use: No    Alcohol/week: 4.0 standard drinks    Types: 4 Cans of beer per week  . Drug use: No     Family Hx: The patient's family history includes Cancer in his sister; Heart attack in his father.  ROS:   Please see the history of present illness.     All other systems reviewed and are negative.   Prior CV studies:   The following studies were reviewed today:  Cath 6/37/8588  LV end diastolic pressure is normal.  There is moderate aortic valve stenosis.  Ost LM lesion, 100 %stenosed.  Prox Cx to Mid Cx lesion, 70 %stenosed.  Mid Cx lesion, 80 %stenosed.  Ost LAD to Prox LAD lesion, 100 %stenosed.  Ost RCA lesion, 99 %stenosed.  Prox RCA lesion, 100 %stenosed.  LIMA graft was visualized by angiography and is normal in caliber and anatomically normal.  Seq SVG- graft was visualized by angiography and is normal in caliber and anatomically normal.  1st Mrg-2 lesion, 90 %stenosed.  1st Mrg-1 lesion, 99 %stenosed.  Dist Graft lesion, 0 %stenosed.  Seq SVG- graft was visualized by angiography and is normal in caliber and anatomically normal.  Origin lesion, 30 %stenosed.  LV end diastolic pressure is normal.   1. Severe 3 vessel and left main occlusive CAD 2. Patent LIMA to the LAD and first diagonal 3. Patent SVG to OM1 and OM2. The first OM is tiny and  diffusely diseased. The stent in the mid SVG is widely patent 4. Patent SVG to PDA and PLOM 5. Moderate Aortic stenosis by cath but Severe AS by Dobutamine stress Echo c/w low gradient severe AS 6. Normal right heart pressures. 7. Normal LV filling pressures 8. Normal cardiac  output.  Plan: consider for AVR/TAVR.    Echo 02/17/2017 LV EF: 40% -  45%   Study Conclusions   - Left ventricle: The cavity size was normal. Wall thickness was  increased in a pattern of mild LVH. Systolic function was mildly  to moderately reduced. The estimated ejection fraction was in the  range of 40% to 45%. Diffuse hypokinesis. Doppler parameters are  consistent with abnormal left ventricular relaxation (grade 1  diastolic dysfunction).  - Aortic valve: Bioprosthetic aortic valve s/p TAVR. Trivial  perivalvular aortic regurgitation. No significant stenosis. Mean  gradient (S): 15 mm Hg.  - Mitral valve: There was no significant regurgitation.  - Left atrium: The atrium was mildly to moderately dilated.  - Right ventricle: The cavity size was mildly dilated. Pacer wire  or catheter noted in right ventricle. Systolic function was  mildly reduced.  - Right atrium: The atrium was mildly dilated.  - Tricuspid valve: Peak RV-RA gradient (S): 25 mm Hg.  - Systemic veins: The IVC was not visualized.   Impressions:   - Normal LV size with mild LV hypertrophy. EF 40-45%, diffuse  hypokinesis. Mildly dilated RV with mildly decreased systolic  function. Normally functioning bioprosthetic aortic valve s/p  TAVR, trivial perivalvular regurgitation.    Labs/Other Tests and Data Reviewed:    EKG:  An ECG dated 05/03/2019 was personally reviewed today and demonstrated:  paced rhythm  Recent Labs: 05/02/2019: BUN 27; Creatinine, Ser 1.29; Hemoglobin 13.6; Magnesium 2.0; Platelets 159; Potassium 4.0; Sodium 140   Recent Lipid Panel Lab Results  Component Value Date/Time   CHOL 150  03/12/2015 09:32 AM   TRIG 105 03/12/2015 09:32 AM   HDL 38 (L) 03/12/2015 09:32 AM   CHOLHDL 3.9 03/12/2015 09:32 AM   LDLCALC 91 03/12/2015 09:32 AM    Wt Readings from Last 3 Encounters:  11/06/19 200 lb (90.7 kg)  05/10/19 213 lb 6.4 oz (96.8 kg)  05/02/19 200 lb (90.7 kg)     Risk Assessment/Calculations:      Objective:    Vital Signs:  Ht 5\' 10"  (1.778 m)   Wt 200 lb (90.7 kg)   BMI 28.70 kg/m    VITAL SIGNS:  reviewed  ASSESSMENT & PLAN:    1. CAD s/p CABG: Chest discomfort has resolved after starting on Ranexa.  Instead of the previous 500 mg a.m. and 250 mg p.m., he is taking 500 mg twice daily dosing.  Continue aspirin and Lipitor.  2. ICM s/p biventricular ICD: Followed by EP service.  Most recent EF 40 to 45%.  He denies any lower extremity edema, orthopnea or PND.   3. History of aortic stenosis s/p TAVR: Stable on last echocardiogram. Consider repeat echocardiogram on the next visit.  SBE prophylaxis prior to any dental procedure.  He has amoxicillin 2 g (4 tablets of 500 mg) taken 1 hour prior to any dental procedure.  4. Hypertension: Continue on current therapy  5. Hyperlipidemia: Continue statin therapy, annual lipid panel per PCP.  COVID-19 Education: The signs and symptoms of COVID-19 were discussed with the patient and how to seek care for testing (follow up with PCP or arrange E-visit).  The importance of social distancing was discussed today.  Time:   Today, I have spent 5 minutes with the patient with telehealth technology discussing the above problems.     Medication Adjustments/Labs and Tests Ordered: Current medicines are reviewed at length with the patient today.  Concerns regarding medicines are outlined above.  Tests Ordered: No orders of the defined types were placed in this encounter.   Medication Changes: No orders of the defined types were placed in this encounter.   Follow Up:  In Person in 6 month(s)  Signed, Almyra Deforest, [place]   11/06/2019 8:29 AM    [facility]

## 2019-11-06 NOTE — Telephone Encounter (Signed)
Attempted to contact patient abut telehealth AVS. Left a voice message informing patient why I was calling and that his AVS will be mailed to his address we have on file.

## 2019-11-06 NOTE — Telephone Encounter (Signed)
°  Patient Consent for Virtual Visit         [name] has provided verbal consent on 11/06/2019 for a virtual visit (video or telephone).   CONSENT FOR VIRTUAL VISIT FOR:  [name]  By participating in this virtual visit I agree to the following:  I hereby voluntarily request, consent and authorize [facility] and its employed or contracted physicians, physician assistants, nurse practitioners or other licensed health care professionals (the Practitioner), to provide me with telemedicine health care services (the Services") as deemed necessary by the treating Practitioner. I acknowledge and consent to receive the Services by the Practitioner via telemedicine. I understand that the telemedicine visit will involve communicating with the Practitioner through live audiovisual communication technology and the disclosure of certain medical information by electronic transmission. I acknowledge that I have been given the opportunity to request an in-person assessment or other available alternative prior to the telemedicine visit and am voluntarily participating in the telemedicine visit.  I understand that I have the right to withhold or withdraw my consent to the use of telemedicine in the course of my care at any time, without affecting my right to future care or treatment, and that the Practitioner or I may terminate the telemedicine visit at any time. I understand that I have the right to inspect all information obtained and/or recorded in the course of the telemedicine visit and may receive copies of available information for a reasonable fee.  I understand that some of the potential risks of receiving the Services via telemedicine include:   Delay or interruption in medical evaluation due to technological equipment failure or disruption;  Information transmitted may not be sufficient (e.g. poor resolution of images) to allow for appropriate medical decision making by the  Practitioner; and/or   In rare instances, security protocols could fail, causing a breach of personal health information.  Furthermore, I acknowledge that it is my responsibility to provide information about my medical history, conditions and care that is complete and accurate to the best of my ability. I acknowledge that Practitioner's advice, recommendations, and/or decision may be based on factors not within their control, such as incomplete or inaccurate data provided by me or distortions of diagnostic images or specimens that may result from electronic transmissions. I understand that the practice of medicine is not an exact science and that Practitioner makes no warranties or guarantees regarding treatment outcomes. I acknowledge that a copy of this consent can be made available to me via my patient portal ([facility]), or I can request a printed copy by calling the office of [facility].    I understand that my insurance will be billed for this visit.   I have read or had this consent read to me.  I understand the contents of this consent, which adequately explains the benefits and risks of the Services being provided via telemedicine.   I have been provided ample opportunity to ask questions regarding this consent and the Services and have had my questions answered to my satisfaction.  I give my informed consent for the services to be provided through the use of telemedicine in my medical care

## 2019-11-08 ENCOUNTER — Encounter: Payer: Self-pay | Admitting: Physician Assistant

## 2019-11-09 ENCOUNTER — Ambulatory Visit (INDEPENDENT_AMBULATORY_CARE_PROVIDER_SITE_OTHER): Payer: Medicare HMO

## 2019-11-09 DIAGNOSIS — I5022 Chronic systolic (congestive) heart failure: Secondary | ICD-10-CM | POA: Diagnosis not present

## 2019-11-09 LAB — CUP PACEART REMOTE DEVICE CHECK
Battery Remaining Longevity: 22 mo
Battery Voltage: 2.93 V
Brady Statistic AP VP Percent: 83.02 %
Brady Statistic AP VS Percent: 0.03 %
Brady Statistic AS VP Percent: 16.93 %
Brady Statistic AS VS Percent: 0.02 %
Brady Statistic RA Percent Paced: 82.86 %
Brady Statistic RV Percent Paced: 99.73 %
Date Time Interrogation Session: 20211028022505
HighPow Impedance: 70 Ohm
Implantable Lead Implant Date: 20161205
Implantable Lead Implant Date: 20161205
Implantable Lead Implant Date: 20161205
Implantable Lead Location: 753858
Implantable Lead Location: 753859
Implantable Lead Location: 753860
Implantable Lead Model: 4598
Implantable Lead Model: 5076
Implantable Lead Model: 6935
Implantable Pulse Generator Implant Date: 20161205
Lead Channel Impedance Value: 342 Ohm
Lead Channel Impedance Value: 342 Ohm
Lead Channel Impedance Value: 361 Ohm
Lead Channel Impedance Value: 399 Ohm
Lead Channel Impedance Value: 399 Ohm
Lead Channel Impedance Value: 418 Ohm
Lead Channel Impedance Value: 456 Ohm
Lead Channel Impedance Value: 456 Ohm
Lead Channel Impedance Value: 589 Ohm
Lead Channel Impedance Value: 608 Ohm
Lead Channel Impedance Value: 608 Ohm
Lead Channel Impedance Value: 646 Ohm
Lead Channel Impedance Value: 665 Ohm
Lead Channel Pacing Threshold Amplitude: 0.5 V
Lead Channel Pacing Threshold Amplitude: 0.625 V
Lead Channel Pacing Threshold Amplitude: 0.875 V
Lead Channel Pacing Threshold Pulse Width: 0.4 ms
Lead Channel Pacing Threshold Pulse Width: 0.4 ms
Lead Channel Pacing Threshold Pulse Width: 0.4 ms
Lead Channel Sensing Intrinsic Amplitude: 1.375 mV
Lead Channel Sensing Intrinsic Amplitude: 1.375 mV
Lead Channel Sensing Intrinsic Amplitude: 11.75 mV
Lead Channel Sensing Intrinsic Amplitude: 11.75 mV
Lead Channel Setting Pacing Amplitude: 2 V
Lead Channel Setting Pacing Amplitude: 2 V
Lead Channel Setting Pacing Amplitude: 2.5 V
Lead Channel Setting Pacing Pulse Width: 0.4 ms
Lead Channel Setting Pacing Pulse Width: 0.4 ms
Lead Channel Setting Sensing Sensitivity: 0.3 mV

## 2019-11-11 ENCOUNTER — Other Ambulatory Visit: Payer: Self-pay | Admitting: Urology

## 2019-11-14 NOTE — Progress Notes (Signed)
Remote ICD transmission.   

## 2019-11-18 ENCOUNTER — Other Ambulatory Visit: Payer: Self-pay | Admitting: Urology

## 2019-11-21 ENCOUNTER — Other Ambulatory Visit: Payer: Self-pay | Admitting: Urology

## 2019-11-23 DIAGNOSIS — N401 Enlarged prostate with lower urinary tract symptoms: Secondary | ICD-10-CM | POA: Diagnosis not present

## 2019-11-27 DIAGNOSIS — N401 Enlarged prostate with lower urinary tract symptoms: Secondary | ICD-10-CM | POA: Diagnosis not present

## 2019-11-27 DIAGNOSIS — R351 Nocturia: Secondary | ICD-10-CM | POA: Diagnosis not present

## 2019-11-27 DIAGNOSIS — R972 Elevated prostate specific antigen [PSA]: Secondary | ICD-10-CM | POA: Diagnosis not present

## 2019-12-05 ENCOUNTER — Other Ambulatory Visit: Payer: Self-pay | Admitting: Cardiology

## 2019-12-09 ENCOUNTER — Other Ambulatory Visit: Payer: Self-pay | Admitting: Cardiology

## 2019-12-11 ENCOUNTER — Telehealth: Payer: Self-pay | Admitting: Cardiology

## 2019-12-11 DIAGNOSIS — I5022 Chronic systolic (congestive) heart failure: Secondary | ICD-10-CM

## 2019-12-11 DIAGNOSIS — I350 Nonrheumatic aortic (valve) stenosis: Secondary | ICD-10-CM

## 2019-12-11 DIAGNOSIS — Z952 Presence of prosthetic heart valve: Secondary | ICD-10-CM

## 2019-12-11 NOTE — Telephone Encounter (Signed)
   Pt c/o medication issue:  1. Name of Medication: isosorbide mononitrate (IMDUR) 120 MG 24 hr tablet  2. How are you currently taking this medication (dosage and times per day)? Pt takes 120 mg 2x daily   3. Are you having a reaction (difficulty breathing--STAT)? no  4. What is your medication issue? Pt needs a new rx for the isosorbide. He states Dr. [name] changed it from once daily to 2x daily and the patient is out of medication. Please send new rx and refills for the other medications listed below to the patient's pharmacy     *STAT* If patient is at the pharmacy, call can be transferred to refill team.   1. Which medications need to be refilled? (please list name of each medication and dose if known)  isosorbide mononitrate (IMDUR) 120 MG 24 hr tablet nitroGLYCERIN (NITROSTAT) 0.4 MG SL tablet atorvastatin (LIPITOR) 80 MG tablet ranolazine (RANEXA) 500 MG 12 hr tablet pantoprazole (PROTONIX) 40 MG tablet ramipril (ALTACE) 5 MG capsule  2. Which pharmacy/location (including street and city if local pharmacy) is medication to be sent to? [place], [place]  3. Do they need a 30 day or 90 day supply? [place]

## 2019-12-12 ENCOUNTER — Telehealth: Payer: Self-pay | Admitting: Cardiology

## 2019-12-12 MED ORDER — ATORVASTATIN CALCIUM 80 MG PO TABS: 80.0000 mg | ORAL_TABLET | Freq: Every day | ORAL | 0 refills | Status: DC

## 2019-12-12 MED ORDER — NITROGLYCERIN 0.4 MG SL SUBL: SUBLINGUAL_TABLET | SUBLINGUAL | 3 refills | Status: DC

## 2019-12-12 MED ORDER — ISOSORBIDE MONONITRATE ER 120 MG PO TB24: 120.0000 mg | ORAL_TABLET | Freq: Two times a day (BID) | ORAL | 3 refills | Status: DC

## 2019-12-12 MED ORDER — RAMIPRIL 5 MG PO CAPS: 5.0000 mg | ORAL_CAPSULE | Freq: Every day | ORAL | 3 refills | Status: DC

## 2019-12-12 MED ORDER — RANOLAZINE ER 500 MG PO TB12: 500.0000 mg | ORAL_TABLET | Freq: Two times a day (BID) | ORAL | 3 refills | Status: DC

## 2019-12-12 MED ORDER — PANTOPRAZOLE SODIUM 40 MG PO TBEC: 40.0000 mg | DELAYED_RELEASE_TABLET | Freq: Every day | ORAL | 1 refills | Status: DC

## 2019-12-12 MED ORDER — ISOSORBIDE MONONITRATE ER 120 MG PO TB24: 120.0000 mg | ORAL_TABLET | Freq: Every day | ORAL | 3 refills | Status: DC

## 2019-12-12 NOTE — Telephone Encounter (Signed)
Prescription were  Sent to pharmacy earlier today on another encounter  this encounter will be closed

## 2019-12-12 NOTE — Telephone Encounter (Signed)
Contacted patient about his medication refill request. Patient states he takes 120 mg table of Imdur twice daily but orders only indicate to take 120 mg Imdur daily.  I have reached out to provider as I could not find cleared orders stating he should increase the dose.   Sending refills as ordered to preferred Pharmacy. Patient aware awaiting orders from MD for the adjusted dosage.

## 2019-12-12 NOTE — Telephone Encounter (Signed)
Got verbal orders from Dr. [name] okay for patient to take Imdur 120 mg BID.  Contacted pharmacy with updated Rx information.

## 2019-12-12 NOTE — Telephone Encounter (Signed)
Pt c/o medication issue:  1. Name of Medication:   isosorbide mononitrate (IMDUR) 120 MG 24 hr tablet  2. How are you currently taking this medication (dosage and times per day)?  Pt takes 120 mg 2x daily   3. Are you having a reaction (difficulty breathing--STAT)? no  4. What is your medication issue? Pt needs a new rx for the isosorbide. He states Dr. [name] changed it from once daily to 2x daily and the patient is out of medication. Please send new rx and refills for the other medications listed below to the patient's pharmacy    *STAT* If patient is at the pharmacy, call can be transferred to refill team.   1. Which medications need to be refilled? (please list name of each medication and dose if known) nitroGLYCERIN (NITROSTAT) 0.4 MG SL tablet isosorbide mononitrate (IMDUR) 120 MG 24 hr tablet  2. Which pharmacy/location (including street and city if local pharmacy) is medication to be sent to? [place], [place]  3. Do they need a 30 day or 90 day supply?  90 day supply - patient states he should receive 200 tablets per 30 days for nitroGLYCERIN 90 days. He states he should be taking 2x per day but the pharmacy did not have the orders for it. Looks as if this has been corrected  This is a second attempt refill request, sent separate to try to make it clearer what the patient is requesting. Please call patient with any questions.

## 2019-12-12 NOTE — Addendum Note (Signed)
Addended by: Newt Minion on: 12/12/2019 12:20 PM   Modules accepted: Orders

## 2019-12-13 ENCOUNTER — Other Ambulatory Visit: Payer: Self-pay | Admitting: Cardiology

## 2019-12-13 NOTE — Telephone Encounter (Signed)
*  STAT* If patient is at the pharmacy, call can be transferred to refill team.   1. Which medications need to be refilled? (please list name of each medication and dose if known) Nitroglycerin  2. Which pharmacy/location (including street and city if local pharmacy) is medication to be sent to? [facility] at [employer], [place]  3. Do they need a 30 day or 90 day supply? 200- he said they keep filling the wrong amount

## 2020-01-03 ENCOUNTER — Other Ambulatory Visit: Payer: Self-pay | Admitting: Cardiology

## 2020-01-03 DIAGNOSIS — I350 Nonrheumatic aortic (valve) stenosis: Secondary | ICD-10-CM

## 2020-01-08 ENCOUNTER — Telehealth: Payer: Self-pay | Admitting: Cardiology

## 2020-01-08 DIAGNOSIS — R972 Elevated prostate specific antigen [PSA]: Secondary | ICD-10-CM | POA: Diagnosis not present

## 2020-01-08 NOTE — Telephone Encounter (Signed)
Spoke to patient Handicap form signed by Dr.Jordan and left at front desk.Stated he will pick up tomorrow.

## 2020-01-08 NOTE — Telephone Encounter (Signed)
Will route to primary nurse.   Thank you!

## 2020-01-08 NOTE — Telephone Encounter (Signed)
To you 

## 2020-01-08 NOTE — Telephone Encounter (Signed)
Patient is calling to confirm some medication that the nurse needs. Please call back

## 2020-02-08 ENCOUNTER — Ambulatory Visit (INDEPENDENT_AMBULATORY_CARE_PROVIDER_SITE_OTHER): Payer: Medicare HMO

## 2020-02-08 DIAGNOSIS — I5022 Chronic systolic (congestive) heart failure: Secondary | ICD-10-CM

## 2020-02-08 LAB — CUP PACEART REMOTE DEVICE CHECK
Battery Remaining Longevity: 20 mo
Battery Voltage: 2.92 V
Brady Statistic AP VP Percent: 42.53 %
Brady Statistic AP VS Percent: 0.01 %
Brady Statistic AS VP Percent: 57.38 %
Brady Statistic AS VS Percent: 0.08 %
Brady Statistic RA Percent Paced: 42.2 %
Brady Statistic RV Percent Paced: 99.3 %
Date Time Interrogation Session: 20220127001604
HighPow Impedance: 70 Ohm
Implantable Lead Implant Date: 20161205
Implantable Lead Implant Date: 20161205
Implantable Lead Implant Date: 20161205
Implantable Lead Location: 753858
Implantable Lead Location: 753859
Implantable Lead Location: 753860
Implantable Lead Model: 4598
Implantable Lead Model: 5076
Implantable Lead Model: 6935
Implantable Pulse Generator Implant Date: 20161205
Lead Channel Impedance Value: 342 Ohm
Lead Channel Impedance Value: 342 Ohm
Lead Channel Impedance Value: 361 Ohm
Lead Channel Impedance Value: 361 Ohm
Lead Channel Impedance Value: 399 Ohm
Lead Channel Impedance Value: 418 Ohm
Lead Channel Impedance Value: 418 Ohm
Lead Channel Impedance Value: 456 Ohm
Lead Channel Impedance Value: 589 Ohm
Lead Channel Impedance Value: 608 Ohm
Lead Channel Impedance Value: 646 Ohm
Lead Channel Impedance Value: 665 Ohm
Lead Channel Impedance Value: 665 Ohm
Lead Channel Pacing Threshold Amplitude: 0.625 V
Lead Channel Pacing Threshold Amplitude: 0.625 V
Lead Channel Pacing Threshold Amplitude: 1 V
Lead Channel Pacing Threshold Pulse Width: 0.4 ms
Lead Channel Pacing Threshold Pulse Width: 0.4 ms
Lead Channel Pacing Threshold Pulse Width: 0.4 ms
Lead Channel Sensing Intrinsic Amplitude: 1.125 mV
Lead Channel Sensing Intrinsic Amplitude: 1.125 mV
Lead Channel Sensing Intrinsic Amplitude: 11.75 mV
Lead Channel Sensing Intrinsic Amplitude: 11.75 mV
Lead Channel Setting Pacing Amplitude: 2 V
Lead Channel Setting Pacing Amplitude: 2 V
Lead Channel Setting Pacing Amplitude: 2.5 V
Lead Channel Setting Pacing Pulse Width: 0.4 ms
Lead Channel Setting Pacing Pulse Width: 0.4 ms
Lead Channel Setting Sensing Sensitivity: 0.3 mV

## 2020-02-19 NOTE — Progress Notes (Signed)
Remote ICD transmission.   

## 2020-03-15 ENCOUNTER — Other Ambulatory Visit: Payer: Self-pay | Admitting: Cardiology

## 2020-03-20 DIAGNOSIS — R972 Elevated prostate specific antigen [PSA]: Secondary | ICD-10-CM | POA: Diagnosis not present

## 2020-03-27 DIAGNOSIS — R351 Nocturia: Secondary | ICD-10-CM | POA: Diagnosis not present

## 2020-03-27 DIAGNOSIS — N401 Enlarged prostate with lower urinary tract symptoms: Secondary | ICD-10-CM | POA: Diagnosis not present

## 2020-03-27 DIAGNOSIS — R972 Elevated prostate specific antigen [PSA]: Secondary | ICD-10-CM | POA: Diagnosis not present

## 2020-04-05 NOTE — Progress Notes (Signed)
[name] Date of Birth: May 01, 1945 Medical Record #[id number]  History of Present Illness: [name] is seen for evaluation of chest pain.  He has a known history of coronary disease and is status post CABG in 1996. This included an LIMA graft to the LAD and diagonal, sequential vein graft to the acute marginal, PDA, and posterior lateral branches of the right coronary, sequential saphenous vein graft to the obtuse marginal vessel and distal circumflex. He had a cardiac catheterization in 2007 which showed that his grafts were all patent. On 04/02/14 he had repeat cath for chest pain and grafts were again patent but there was a 90% stenosis in the SVG to OM2. This was stented with a DES.  Despite this intervention he has continued to have chest pain. He had cholecystectomy in January 3976 without complications and without relief of his pain. CXR was unremarkable. CT of the chest was negative for PE or aortic pathology. He had EGD by Dr. Oletta Lamas that showed Barrett's esophagus but no evidence of esophagitis. He is on Protonix.   In Dec 2016  he was admitted for evaluation of syncope. Echo showed EF 30-35% with moderate to severe AS. Mean gradient 24 mm Hg. AVA 0.8-0.9. There was concern that AVA was underestimated due to low output. EEG was negative. He had a LBBB. He underwent placement of a BIV-ICD by Dr. Lovena Le.  He then  had a repeat Echo showing some increase in AV gradient. He underwent a Dobutamine Echo. This showed improvement in EF to 40-45%. There was also increase in AV gradient with a mean gradient of 41 mm Hg and severe AS. He underwent uncomplicated TAVR 73/41/9379 via a percutaneous right transfemoral approach. He was treated with a 26 mm Edwards Sapien 3 transcatheter heart valve. Repeat Echo in March 2018 showed improvement in EF to 40-45%. Echo in February 2019 was unchanged.   Despite all these interventions he has continued to have chest pain. He previously described his pain as  right parasternal radiating to his jaw. It is relieved with sl Ntg. Typically occurs at night. He was able to do heavy yard work such as lifting, hauling, and blowing leaves without change. He does have some SOB. We did switch to him taking Imdur at bedtime and this seemed to help.  Pacemaker check in January was stable.   On follow up today he is doing OK. Notes more swelling in his feet. Also complains of neuropathy- feels like he is walking on rocks. He hasn't been as active this winter and has gained weight. Notes some SOB with activity that improves with rest. Still has CP that is nitrate responsive. This may be exertional but also is worse after spicy food. He is active with yard work  Current Outpatient Medications on File Prior to Visit  Medication Sig Dispense Refill  . amoxicillin (AMOXIL) 500 MG tablet Take 2g (four 500 mg tablets) one hour prior to dental procedures. 4 tablet 3  . Ascorbic Acid (VITAMIN C) 1000 MG tablet Take 1,000 mg by mouth daily.    Marland Kitchen aspirin EC 81 MG EC tablet Take 1 tablet (81 mg total) by mouth daily.    Marland Kitchen atorvastatin (LIPITOR) 80 MG tablet TAKE ONE TABLET BY MOUTH DAILY 90 tablet 3  . Cholecalciferol (VITAMIN D-3) 5000 units TABS Take 5,000 Units by mouth daily.     . finasteride (PROSCAR) 5 MG tablet Take 5 mg by mouth daily.    . isosorbide mononitrate (IMDUR) 120 MG  24 hr tablet Take 1 tablet (120 mg total) by mouth 2 (two) times daily after a meal. 180 tablet 3  . metoprolol succinate (TOPROL-XL) 50 MG 24 hr tablet TAKE ONE TABLET BY MOUTH DAILY WITH OR IMMEDIATELY FOLLOWING A MEAL 90 tablet 2  . nitroGLYCERIN (NITROSTAT) 0.4 MG SL tablet TAKE 5 TO 7 TABLETS BY MOUTH PER DAY AS NEEDED 75 tablet 1  . oxybutynin (DITROPAN) 5 MG tablet Take 5 mg by mouth daily.     . pantoprazole (PROTONIX) 40 MG tablet Take 1 tablet (40 mg total) by mouth daily. 90 tablet 1  . ramipril (ALTACE) 5 MG capsule Take 1 capsule (5 mg total) by mouth daily. 90 capsule 3  .  ranolazine (RANEXA) 500 MG 12 hr tablet Take 1 tablet (500 mg total) by mouth 2 (two) times daily. 180 tablet 3  . valACYclovir (VALTREX) 500 MG tablet TAKE ONE TABLET BY MOUTH DAILY AS NEEDED FOR OUTBREAK 30 tablet 0   No current facility-administered medications on file prior to visit.    Allergies  Allergen Reactions  . Ranexa [Ranolazine] Other (See Comments)    Caused vertigo at 500 mg twice-a-day dosing    Past Medical History:  Diagnosis Date  . Aortic stenosis   . Barrett's esophageal ulceration   . Chronic systolic CHF (congestive heart failure) ([place])   . Coronary artery disease    a. CABG 1996: LIMA graft to the LAD and diagonal, sequential vein graft to the acute marginal, PDA, and posterior lateral branches of the right coronary, sequential saphenous vein graft to the obtuse marginal vessel and distal circumflex. b. Cath 2007 patent grafts. c. s/p DES to SVG-OM2 in 03/2014.  Marland Kitchen Coronary artery disease involving autologous vein coronary bypass graft with angina pectoris ([place]) 03/13/2014   PCI using DES in SVG to LCx system  . Dental caries    pre-heart valve surgery protocol  . Gastroesophageal reflux disease   . History of erectile dysfunction   . History of hiatal hernia   . History of kidney stones   . History of obesity   . History of prostatitis   . Hypercholesterolemia   . Hypertension   . Kidney stones   . LBBB (left bundle branch block)   . Nodule of kidney 02/04/2016   Incidental 29mm nodule upper pole right kidney noted on CT angiogram - MRI with and without gadolinium contrast recommened in 6 months  . OSA (obstructive sleep apnea)    "suppose to wear mask; I don't" (03/13/2014)  . PVC's (premature ventricular contractions)   . S/P CABG x 7 01/07/1995   LIMA to LAD-diagonal, SVG to OM-LCx, SVG to AM-PD-RPL  . S/P TAVR (transcatheter aortic valve replacement) 02/18/2016   26 mm Edwards Sapien 3 transcatheter heart valve placed via percutaneous right transfemoral  approach    Past Surgical History:  Procedure Laterality Date  . [place]; ~ 2006   ; Ejection fraction is estimated at 45%  . CARDIAC CATHETERIZATION N/A 01/22/2016   Procedure: Right/Left Heart Cath and Coronary/Graft Angiography;  Surgeon: Dezarae Mcclaran M [name], MD;  Location: [facility];  Service: Cardiovascular;  Laterality: N/A;  . CHOLECYSTECTOMY N/A 01/23/2014   Procedure: LAPAROSCOPIC CHOLECYSTECTOMY WITH INTRAOPERATIVE CHOLANGIOGRAM ;  Surgeon: Fanny Skates, MD;  Location: [facility];  Service: General;  Laterality: N/A;  . COLONOSCOPY W/ POLYPECTOMY    . CORONARY ANGIOPLASTY WITH STENT PLACEMENT  03/13/2014   "1"  . CORONARY ARTERY BYPASS GRAFT  1996   "  CABG X 7"  . CYSTOSCOPY WITH STENT PLACEMENT    . EP IMPLANTABLE DEVICE N/A 12/17/2014   Procedure: BiV ICD Insertion CRT-D;  Surgeon: Evans Lance, MD;  Location: [facility];  Service: Cardiovascular;  Laterality: N/A;  . LEFT HEART CATHETERIZATION WITH CORONARY/GRAFT ANGIOGRAM N/A 03/13/2014   Procedure: LEFT HEART CATHETERIZATION WITH Beatrix Fetters;  Surgeon: Cynethia Schindler M [name], MD;  [facility];  Service: Cardiovascular;  Laterality: N/A;  . MULTIPLE EXTRACTIONS WITH ALVEOLOPLASTY N/A 02/07/2016   Procedure: Extraction of tooth #'s 1,2,3,14,15, 18, 20, 29,30,31 with alveoloplasty and dental cleaning of teeth.;  Surgeon: Lenn Cal, DDS;  Location: [facility];  Service: Oral Surgery;  Laterality: N/A;  . TEE WITHOUT CARDIOVERSION N/A 02/18/2016   Procedure: TRANSESOPHAGEAL ECHOCARDIOGRAM (TEE);  Surgeon: Sherren Mocha, MD;  Location: [place];  Service: Open Heart Surgery;  Laterality: N/A;  . TONSILLECTOMY  ~ 1950  . TRANSCATHETER AORTIC VALVE REPLACEMENT, TRANSFEMORAL N/A 02/18/2016   Procedure: TRANSCATHETER AORTIC VALVE REPLACEMENT, TRANSFEMORAL;  Surgeon: Sherren Mocha, MD;  Location: [facility];  Service: Open Heart Surgery;  Laterality: N/A;    Social History   Tobacco Use  Smoking  Status Former Smoker  . Packs/day: 0.75  . Years: 15.00  . Pack years: 11.25  . Types: Cigarettes  . Quit date: 01/12/1997  . Years since quitting: 23.2  Smokeless Tobacco Never Used  Tobacco Comment   3/4 pack per day. smoked 15 years off and on    Social History   Substance and Sexual Activity  Alcohol Use No  . Alcohol/week: 4.0 standard drinks  . Types: 4 Cans of beer per week    Family History  Problem Relation Age of Onset  . Heart attack Father   . Cancer Sister        Uterine Cancer    Review of Systems: As noted in HPI.  All other systems were reviewed and are negative.  Physical Exam: There were no vitals taken for this visit. GENERAL:  Well appearing WM in NAD HEENT:  PERRL, EOMI, sclera are clear. Oropharynx is clear. NECK:  No jugular venous distention, carotid upstroke brisk and symmetric, no bruits, no thyromegaly or adenopathy LUNGS:  Clear to auscultation bilaterally CHEST:  Unremarkable HEART:  RRR,  PMI not displaced or sustained,S1 and S2 within normal limits, no S3, no S4: no clicks, no rubs, gr 1/6 systolic murmur RUSB. ABD:  Soft, nontender. BS +, no masses or bruits. No hepatomegaly, no splenomegaly EXT:  2 + pulses throughout, tr-1+ edema, no cyanosis no clubbing SKIN:  Warm and dry.  No rashes NEURO:  Alert and oriented x 3. Cranial nerves II through XII intact. PSYCH:  Cognitively intact    LABORATORY DATA:   Lab Results  Component Value Date   WBC 5.2 05/02/2019   HGB 13.6 05/02/2019   HCT 41.6 05/02/2019   PLT 159 05/02/2019   GLUCOSE 133 (H) 05/02/2019   CHOL 150 03/12/2015   TRIG 105 03/12/2015   HDL 38 (L) 03/12/2015   LDLCALC 91 03/12/2015   ALT 37 02/14/2016   AST 39 02/14/2016   NA 140 05/02/2019   K 4.0 05/02/2019   CL 104 05/02/2019   CREATININE 1.29 (H) 05/02/2019   BUN 27 (H) 05/02/2019   CO2 26 05/02/2019   TSH 1.572 12/16/2014   INR 2.4 09/16/2016   HGBA1C 6.0 (H) 02/14/2016   Ecg today shows AV pacing 60.  I have personally reviewed and interpreted this  study.   Echo dated 03/13/16: Study Conclusions  - Left ventricle: The cavity size was normal. There was moderate   concentric hypertrophy. Systolic function was mildly to   moderately reduced. The estimated ejection fraction was in the   range of 40% to 45%. Hypokinesis of the anterolateral and   inferolateral myocardium; consistent with ischemia in the   distribution of the left circumflex coronary artery. Doppler   parameters are consistent with abnormal left ventricular   relaxation (grade 1 diastolic dysfunction). Doppler parameters   are consistent with elevated mean left atrial filling pressure. - Ventricular septum: Septal motion showed paradox. These changes   are consistent with a left bundle branch block. - Aortic valve: A stent-valve bioprosthesis (TAVR) was present and   functioning normally. - Left atrium: The atrium was severely dilated. - Right ventricle: The cavity size was mildly dilated.  Impressions:  - Aortic valve gradients are similar to the 02/18/2016 study.  ------------------------------------------------------------------- Labs, prior tests, procedures, and surgery: Status post TAVR (26 mm Edwards Sapien 3 Transcatheter Bioprosthesis model # [id number]; serial # [id number]).  Coronary artery bypass grafting.  Echo 02/17/17: Study Conclusions  - Left ventricle: The cavity size was normal. Wall thickness was   increased in a pattern of mild LVH. Systolic function was mildly   to moderately reduced. The estimated ejection fraction was in the   range of 40% to 45%. Diffuse hypokinesis. Doppler parameters are   consistent with abnormal left ventricular relaxation (grade 1   diastolic dysfunction). - Aortic valve: Bioprosthetic aortic valve s/p TAVR. Trivial   perivalvular aortic regurgitation. No significant stenosis. Mean   gradient (S): 15 mm Hg. - Mitral valve: There was no significant regurgitation. - Left  atrium: The atrium was mildly to moderately dilated. - Right ventricle: The cavity size was mildly dilated. Pacer wire   or catheter noted in right ventricle. Systolic function was   mildly reduced. - Right atrium: The atrium was mildly dilated. - Tricuspid valve: Peak RV-RA gradient (S): 25 mm Hg. - Systemic veins: The IVC was not visualized.   Assessment / Plan: 1. Coronary disease with remote coronary bypass surgery in 1996. Cath in March 2016 showed high grade disease in SVG to OM. s/p successful PCI with DES.  Native vessels all occluded and he is graft dependent. Repeat cath in January 2018 showed patent grafts. He has chronic nitrate responsive chest pain but symptoms are stable.  He is on Imdur 120 mg daily and Toprol XL. He is doing better on Ranexa but still takes Ntg frequently. He is comfortable with current status. If symptoms worsen cardiac cath would be needed. He is adamant that he will not have another bypass operation.  2.  Severe aortic stenosis. S/p TAVR with #23 Edwards Sapien valve. Stable by exam  3. Chronic systolic CHF. Asymptomatic. On ACEi and Toprol XL. EF improved to 40-45%. He has mild swelling in feet and ankles. At this point have recommended sodium restriction. If swelling worsens could consider diuretic therapy.  4. Syncope-  Now s/p BiV ICD. Paced.  5. Obesity. Encourage weight loss.   6. Hyperlipidemia. On lipitor.   7. LBBB  We will check lab work today including CMET, CBC, lipids.    Follow up in one year

## 2020-04-06 ENCOUNTER — Other Ambulatory Visit: Payer: Self-pay | Admitting: Cardiology

## 2020-04-06 DIAGNOSIS — I350 Nonrheumatic aortic (valve) stenosis: Secondary | ICD-10-CM

## 2020-04-07 ENCOUNTER — Other Ambulatory Visit: Payer: Self-pay | Admitting: Cardiology

## 2020-04-08 ENCOUNTER — Telehealth: Payer: Self-pay | Admitting: Cardiology

## 2020-04-08 NOTE — Telephone Encounter (Signed)
*  STAT* If patient is at the pharmacy, call can be transferred to refill team.   1. Which medications need to be refilled? (please list name of each medication and dose if known) nitroGLYCERIN (NITROSTAT) 0.4 MG SL tablet  2. Which pharmacy/location (including street and city if local pharmacy) is medication to be sent to? [place], [place]  3. Do they need a 30 day or 90 day supply? 30 day supply  Patient is requesting a 200 tablet/1 month supply. He states that is the amount he normally gets.

## 2020-04-10 ENCOUNTER — Other Ambulatory Visit: Payer: Self-pay

## 2020-04-10 ENCOUNTER — Ambulatory Visit: Payer: Medicare HMO | Admitting: Cardiology

## 2020-04-10 ENCOUNTER — Encounter: Payer: Self-pay | Admitting: Cardiology

## 2020-04-10 VITALS — BP 124/66 | HR 60 | Ht 70.0 in | Wt 222.0 lb

## 2020-04-10 DIAGNOSIS — Z952 Presence of prosthetic heart valve: Secondary | ICD-10-CM | POA: Diagnosis not present

## 2020-04-10 DIAGNOSIS — Z95810 Presence of automatic (implantable) cardiac defibrillator: Secondary | ICD-10-CM | POA: Diagnosis not present

## 2020-04-10 DIAGNOSIS — I447 Left bundle-branch block, unspecified: Secondary | ICD-10-CM

## 2020-04-10 DIAGNOSIS — E785 Hyperlipidemia, unspecified: Secondary | ICD-10-CM

## 2020-04-10 DIAGNOSIS — I25810 Atherosclerosis of coronary artery bypass graft(s) without angina pectoris: Secondary | ICD-10-CM

## 2020-04-10 DIAGNOSIS — I5022 Chronic systolic (congestive) heart failure: Secondary | ICD-10-CM

## 2020-04-10 DIAGNOSIS — I350 Nonrheumatic aortic (valve) stenosis: Secondary | ICD-10-CM | POA: Diagnosis not present

## 2020-04-10 LAB — CBC WITH DIFFERENTIAL/PLATELET
Basophils Absolute: 0 10*3/uL (ref 0.0–0.2)
Basos: 1 %
EOS (ABSOLUTE): 0.1 10*3/uL (ref 0.0–0.4)
Eos: 2 %
Hematocrit: 42.6 % (ref 37.5–51.0)
Hemoglobin: 14.1 g/dL (ref 13.0–17.7)
Immature Grans (Abs): 0.1 10*3/uL (ref 0.0–0.1)
Immature Granulocytes: 1 %
Lymphocytes Absolute: 0.6 10*3/uL — ABNORMAL LOW (ref 0.7–3.1)
Lymphs: 13 %
MCH: 30.5 pg (ref 26.6–33.0)
MCHC: 33.1 g/dL (ref 31.5–35.7)
MCV: 92 fL (ref 79–97)
Monocytes Absolute: 0.6 10*3/uL (ref 0.1–0.9)
Monocytes: 13 %
Neutrophils Absolute: 3.5 10*3/uL (ref 1.4–7.0)
Neutrophils: 70 %
Platelets: 189 10*3/uL (ref 150–450)
RBC: 4.62 x10E6/uL (ref 4.14–5.80)
RDW: 13.1 % (ref 11.6–15.4)
WBC: 5 10*3/uL (ref 3.4–10.8)

## 2020-04-10 LAB — COMPREHENSIVE METABOLIC PANEL
ALT: 26 IU/L (ref 0–44)
AST: 39 IU/L (ref 0–40)
Albumin/Globulin Ratio: 2.1 (ref 1.2–2.2)
Albumin: 4.4 g/dL (ref 3.7–4.7)
Alkaline Phosphatase: 66 IU/L (ref 44–121)
BUN/Creatinine Ratio: 18 (ref 10–24)
BUN: 21 mg/dL (ref 8–27)
Bilirubin Total: 2 mg/dL — ABNORMAL HIGH (ref 0.0–1.2)
CO2: 24 mmol/L (ref 20–29)
Calcium: 9.4 mg/dL (ref 8.6–10.2)
Chloride: 102 mmol/L (ref 96–106)
Creatinine, Ser: 1.16 mg/dL (ref 0.76–1.27)
Globulin, Total: 2.1 g/dL (ref 1.5–4.5)
Glucose: 105 mg/dL — ABNORMAL HIGH (ref 65–99)
Potassium: 4.7 mmol/L (ref 3.5–5.2)
Sodium: 141 mmol/L (ref 134–144)
Total Protein: 6.5 g/dL (ref 6.0–8.5)
eGFR: 65 mL/min/{1.73_m2} (ref >59)

## 2020-04-10 LAB — LIPID PANEL
Chol/HDL Ratio: 3 ratio (ref 0.0–5.0)
Cholesterol, Total: 151 mg/dL (ref 100–199)
HDL: 51 mg/dL (ref >39)
LDL Chol Calc (NIH): 84 mg/dL (ref 0–99)
Triglycerides: 82 mg/dL (ref 0–149)
VLDL Cholesterol Cal: 16 mg/dL (ref 5–40)

## 2020-04-10 MED ORDER — NITROGLYCERIN 0.4 MG SL SUBL: SUBLINGUAL_TABLET | SUBLINGUAL | 21 refills | Status: DC

## 2020-04-10 NOTE — Patient Instructions (Signed)
Low-Sodium Eating Plan Sodium, which is an element that makes up salt, helps you maintain a healthy balance of fluids in your body. Too much sodium can increase your blood pressure and cause fluid and waste to be held in your body. Your health care provider or dietitian may recommend following this plan if you have high blood pressure (hypertension), kidney disease, liver disease, or heart failure. Eating less sodium can help lower your blood pressure, reduce swelling, and protect your heart, liver, and kidneys. What are tips for following this plan? Reading food labels  The Nutrition Facts label lists the amount of sodium in one serving of the food. If you eat more than one serving, you must multiply the listed amount of sodium by the number of servings.  Choose foods with less than 140 mg of sodium per serving.  Avoid foods with 300 mg of sodium or more per serving. Shopping  Look for lower-sodium products, often labeled as "low-sodium" or "no salt added."  Always check the sodium content, even if foods are labeled as "unsalted" or "no salt added."  Buy fresh foods. ? Avoid canned foods and pre-made or frozen meals. ? Avoid canned, cured, or processed meats.  Buy breads that have less than 80 mg of sodium per slice.   Cooking  Eat more home-cooked food and less restaurant, buffet, and fast food.  Avoid adding salt when cooking. Use salt-free seasonings or herbs instead of table salt or sea salt. Check with your health care provider or pharmacist before using salt substitutes.  Cook with plant-based oils, such as canola, sunflower, or olive oil.   Meal planning  When eating at a restaurant, ask that your food be prepared with less salt or no salt, if possible. Avoid dishes labeled as brined, pickled, cured, smoked, or made with soy sauce, miso, or teriyaki sauce.  Avoid foods that contain MSG (monosodium glutamate). MSG is sometimes added to Chinese food, bouillon, and some canned  foods.  Make meals that can be grilled, baked, poached, roasted, or steamed. These are generally made with less sodium. General information Most people on this plan should limit their sodium intake to 1,500-2,000 mg (milligrams) of sodium each day. What foods should I eat? Fruits Fresh, frozen, or canned fruit. Fruit juice. Vegetables Fresh or frozen vegetables. "No salt added" canned vegetables. "No salt added" tomato sauce and paste. Low-sodium or reduced-sodium tomato and vegetable juice. Grains Low-sodium cereals, including oats, puffed wheat and rice, and shredded wheat. Low-sodium crackers. Unsalted rice. Unsalted pasta. Low-sodium bread. Whole-grain breads and whole-grain pasta. Meats and other proteins Fresh or frozen (no salt added) meat, poultry, seafood, and fish. Low-sodium canned tuna and salmon. Unsalted nuts. Dried peas, beans, and lentils without added salt. Unsalted canned beans. Eggs. Unsalted nut butters. Dairy Milk. Soy milk. Cheese that is naturally low in sodium, such as ricotta cheese, fresh mozzarella, or Swiss cheese. Low-sodium or reduced-sodium cheese. Cream cheese. Yogurt. Seasonings and condiments Fresh and dried herbs and spices. Salt-free seasonings. Low-sodium mustard and ketchup. Sodium-free salad dressing. Sodium-free light mayonnaise. Fresh or refrigerated horseradish. Lemon juice. Vinegar. Other foods Homemade, reduced-sodium, or low-sodium soups. Unsalted popcorn and pretzels. Low-salt or salt-free chips. The items listed above may not be a complete list of foods and beverages you can eat. Contact a dietitian for more information. What foods should I avoid? Vegetables Sauerkraut, pickled vegetables, and relishes. Olives. French fries. Onion rings. Regular canned vegetables (not low-sodium or reduced-sodium). Regular canned tomato sauce and paste (not low-sodium   or reduced-sodium). Regular tomato and vegetable juice (not low-sodium or reduced-sodium). Frozen  vegetables in sauces. Grains Instant hot cereals. Bread stuffing, pancake, and biscuit mixes. Croutons. Seasoned rice or pasta mixes. Noodle soup cups. Boxed or frozen macaroni and cheese. Regular salted crackers. Self-rising flour. Meats and other proteins Meat or fish that is salted, canned, smoked, spiced, or pickled. Precooked or cured meat, such as sausages or meat loaves. Bacon. Ham. Pepperoni. Hot dogs. Corned beef. Chipped beef. Salt pork. Jerky. Pickled herring. Anchovies and sardines. Regular canned tuna. Salted nuts. Dairy Processed cheese and cheese spreads. Hard cheeses. Cheese curds. Blue cheese. Feta cheese. String cheese. Regular cottage cheese. Buttermilk. Canned milk. Fats and oils Salted butter. Regular margarine. Ghee. Bacon fat. Seasonings and condiments Onion salt, garlic salt, seasoned salt, table salt, and sea salt. Canned and packaged gravies. Worcestershire sauce. Tartar sauce. Barbecue sauce. Teriyaki sauce. Soy sauce, including reduced-sodium. Steak sauce. Fish sauce. Oyster sauce. Cocktail sauce. Horseradish that you find on the shelf. Regular ketchup and mustard. Meat flavorings and tenderizers. Bouillon cubes. Hot sauce. Pre-made or packaged marinades. Pre-made or packaged taco seasonings. Relishes. Regular salad dressings. Salsa. Other foods Salted popcorn and pretzels. Corn chips and puffs. Potato and tortilla chips. Canned or dried soups. Pizza. Frozen entrees and pot pies. The items listed above may not be a complete list of foods and beverages you should avoid. Contact a dietitian for more information. Summary  Eating less sodium can help lower your blood pressure, reduce swelling, and protect your heart, liver, and kidneys.  Most people on this plan should limit their sodium intake to 1,500-2,000 mg (milligrams) of sodium each day.  Canned, boxed, and frozen foods are high in sodium. Restaurant foods, fast foods, and pizza are also very high in sodium. You  also get sodium by adding salt to food.  Try to cook at home, eat more fresh fruits and vegetables, and eat less fast food and canned, processed, or prepared foods. This information is not intended to replace advice given to you by your health care provider. Make sure you discuss any questions you have with your health care provider. Document Revised: 02/03/2019 Document Reviewed: 11/30/2018 Elsevier Patient Education  2021 Elsevier Inc.  

## 2020-04-11 ENCOUNTER — Other Ambulatory Visit: Payer: Self-pay

## 2020-04-11 DIAGNOSIS — E785 Hyperlipidemia, unspecified: Secondary | ICD-10-CM

## 2020-04-11 DIAGNOSIS — I25119 Atherosclerotic heart disease of native coronary artery with unspecified angina pectoris: Secondary | ICD-10-CM

## 2020-04-11 MED ORDER — EZETIMIBE 10 MG PO TABS: 10.0000 mg | ORAL_TABLET | Freq: Every day | ORAL | 3 refills | Status: DC

## 2020-05-06 ENCOUNTER — Telehealth: Payer: Self-pay | Admitting: Cardiology

## 2020-05-06 MED ORDER — NITROGLYCERIN 0.4 MG SL SUBL: SUBLINGUAL_TABLET | SUBLINGUAL | 6 refills | Status: DC

## 2020-05-06 NOTE — Telephone Encounter (Signed)
Spoke to patient NTG refill sent to pharmacy.

## 2020-05-06 NOTE — Telephone Encounter (Signed)
Pt c/o medication issue:  1. Name of Medication:  nitroGLYCERIN (NITROSTAT) 0.4 MG SL tablet  2. How are you currently taking this medication (dosage and times per day)? As written   3. Are you having a reaction (difficulty breathing--STAT)?  No   4. What is your medication issue?  Patient says that his insurance will not let him get the medication until May and he can't wait that long. Also needs a 6 month supply instead of 3 months.

## 2020-05-07 ENCOUNTER — Telehealth: Payer: Self-pay | Admitting: Cardiology

## 2020-05-07 MED ORDER — NITROGLYCERIN 0.4 MG SL SUBL: SUBLINGUAL_TABLET | SUBLINGUAL | 6 refills | Status: DC

## 2020-05-07 NOTE — Telephone Encounter (Signed)
Spoke with patient of Dr. [name]. He said his pharmacy will not fill his NTG as prescribed as it needs to state it is for 30 days. He gets a quantity of 200 tablets and requested 6 refills as he states he takes this for a hiatal hernia issue also, of which he reports Dr. [name] is aware. Re-sent refill to pharmacy with added note of "for 30 day supply"

## 2020-05-07 NOTE — Telephone Encounter (Signed)
Patient called in asking speak to the nurse in regards to medication nitroGLYCERIN (NITROSTAT) 0.4 MG SL tablet

## 2020-05-07 NOTE — Telephone Encounter (Signed)
nitroGLYCERIN (NITROSTAT) 0.4 MG SL tablet 200 tablet 6 05/06/2020    Sig: DISSOLVE 1 TAB UNDER TONGUE FOR CHEST PAIN - IF PAIN REMAINS AFTER 5 MIN, CALL 911 AND REPEAT DOSE. MAX 3 TABS IN 15 MINUTES   Sent to pharmacy as: nitroGLYCERIN (NITROSTAT) 0.4 MG SL tablet   E-Prescribing Status: Receipt confirmed by pharmacy (05/06/2020 8:40 AM EDT)     [place], [place]

## 2020-05-08 ENCOUNTER — Other Ambulatory Visit: Payer: Self-pay

## 2020-05-09 ENCOUNTER — Ambulatory Visit (INDEPENDENT_AMBULATORY_CARE_PROVIDER_SITE_OTHER): Payer: Medicare HMO

## 2020-05-09 DIAGNOSIS — I447 Left bundle-branch block, unspecified: Secondary | ICD-10-CM

## 2020-05-10 LAB — CUP PACEART REMOTE DEVICE CHECK
Battery Remaining Longevity: 19 mo
Battery Voltage: 2.91 V
Brady Statistic AP VP Percent: 46.83 %
Brady Statistic AP VS Percent: 0.01 %
Brady Statistic AS VP Percent: 53.06 %
Brady Statistic AS VS Percent: 0.09 %
Brady Statistic RA Percent Paced: 46.52 %
Brady Statistic RV Percent Paced: 99.44 %
Date Time Interrogation Session: 20220429013723
HighPow Impedance: 72 Ohm
Implantable Lead Implant Date: 20161205
Implantable Lead Implant Date: 20161205
Implantable Lead Implant Date: 20161205
Implantable Lead Location: 753858
Implantable Lead Location: 753859
Implantable Lead Location: 753860
Implantable Lead Model: 4598
Implantable Lead Model: 5076
Implantable Lead Model: 6935
Implantable Pulse Generator Implant Date: 20161205
Lead Channel Impedance Value: 342 Ohm
Lead Channel Impedance Value: 342 Ohm
Lead Channel Impedance Value: 361 Ohm
Lead Channel Impedance Value: 361 Ohm
Lead Channel Impedance Value: 399 Ohm
Lead Channel Impedance Value: 399 Ohm
Lead Channel Impedance Value: 418 Ohm
Lead Channel Impedance Value: 456 Ohm
Lead Channel Impedance Value: 589 Ohm
Lead Channel Impedance Value: 608 Ohm
Lead Channel Impedance Value: 646 Ohm
Lead Channel Impedance Value: 646 Ohm
Lead Channel Impedance Value: 665 Ohm
Lead Channel Pacing Threshold Amplitude: 0.625 V
Lead Channel Pacing Threshold Amplitude: 0.625 V
Lead Channel Pacing Threshold Amplitude: 0.875 V
Lead Channel Pacing Threshold Pulse Width: 0.4 ms
Lead Channel Pacing Threshold Pulse Width: 0.4 ms
Lead Channel Pacing Threshold Pulse Width: 0.4 ms
Lead Channel Sensing Intrinsic Amplitude: 1.625 mV
Lead Channel Sensing Intrinsic Amplitude: 1.625 mV
Lead Channel Sensing Intrinsic Amplitude: 9.5 mV
Lead Channel Sensing Intrinsic Amplitude: 9.5 mV
Lead Channel Setting Pacing Amplitude: 2 V
Lead Channel Setting Pacing Amplitude: 2 V
Lead Channel Setting Pacing Amplitude: 2.5 V
Lead Channel Setting Pacing Pulse Width: 0.4 ms
Lead Channel Setting Pacing Pulse Width: 0.4 ms
Lead Channel Setting Sensing Sensitivity: 0.3 mV

## 2020-05-10 NOTE — Telephone Encounter (Signed)
This encounter was created in error - please disregard.

## 2020-05-13 ENCOUNTER — Telehealth: Payer: Self-pay

## 2020-05-13 NOTE — Telephone Encounter (Signed)
Spoke with pt regarding episode of Af/ Afl occurring 3/22.  He does not recall it specifically, although does say he had a couple of days in March wear he did feel fatigued unexpectedly.    Provided some education regarding EF and concerns/ risks.  Also overview of treatment/ precautions.  advised Dr. Lovena Le recommends AF clinic consult to determine if [facility] needed/ best course of action.    Pt agreeable to consult.  Stated he can be reached at either home or Mobile number on record which is his wife cellphone number.

## 2020-05-13 NOTE — Telephone Encounter (Signed)
-----   Message from Evans Lance, MD sent at 05/12/2020  8:16 PM EDT ----- Mackie Goon, please send him to the atrial fib clinic to initiate eliquis or xarelto. GT ----- Message ----- From: York Ram, RN Sent: 05/10/2020   1:55 PM EDT To: Evans Lance, MD  AF/AFl episode occuring 04/02/20 lasting >6.5 hours with Max atrial rate of 545 (average rate 286).  No documented history of AF/ AFl.  Patient has not had in person check since 2018.  Message sent to scheduling to contact patient.

## 2020-05-13 NOTE — Telephone Encounter (Signed)
Called and spoke with patient, he is aware of appt 05/16/20 at 9:30 am with Roderic Palau, NP.

## 2020-05-16 ENCOUNTER — Other Ambulatory Visit: Payer: Self-pay

## 2020-05-16 ENCOUNTER — Encounter (HOSPITAL_COMMUNITY): Payer: Self-pay | Admitting: Nurse Practitioner

## 2020-05-16 ENCOUNTER — Ambulatory Visit (HOSPITAL_COMMUNITY)
Admission: RE | Admit: 2020-05-16 | Discharge: 2020-05-16 | Disposition: A | Discharge status: Home / Self Care | Payer: Medicare HMO | Source: Ambulatory Visit | Attending: Nurse Practitioner | Admitting: Nurse Practitioner

## 2020-05-16 VITALS — BP 114/70 | HR 60 | Ht 70.0 in | Wt 217.6 lb

## 2020-05-16 DIAGNOSIS — Z7982 Long term (current) use of aspirin: Secondary | ICD-10-CM | POA: Insufficient documentation

## 2020-05-16 DIAGNOSIS — I480 Paroxysmal atrial fibrillation: Secondary | ICD-10-CM | POA: Diagnosis not present

## 2020-05-16 DIAGNOSIS — I4891 Unspecified atrial fibrillation: Secondary | ICD-10-CM | POA: Insufficient documentation

## 2020-05-16 DIAGNOSIS — Z95810 Presence of automatic (implantable) cardiac defibrillator: Secondary | ICD-10-CM | POA: Diagnosis not present

## 2020-05-16 DIAGNOSIS — Z952 Presence of prosthetic heart valve: Secondary | ICD-10-CM | POA: Insufficient documentation

## 2020-05-16 DIAGNOSIS — I2510 Atherosclerotic heart disease of native coronary artery without angina pectoris: Secondary | ICD-10-CM | POA: Insufficient documentation

## 2020-05-16 DIAGNOSIS — Z951 Presence of aortocoronary bypass graft: Secondary | ICD-10-CM | POA: Insufficient documentation

## 2020-05-16 DIAGNOSIS — Z87891 Personal history of nicotine dependence: Secondary | ICD-10-CM | POA: Insufficient documentation

## 2020-05-16 DIAGNOSIS — Z79899 Other long term (current) drug therapy: Secondary | ICD-10-CM | POA: Insufficient documentation

## 2020-05-16 MED ORDER — ELIQUIS 5 MG PO TABS: 5.0000 mg | ORAL_TABLET | Freq: Two times a day (BID) | ORAL | 3 refills | Status: DC

## 2020-05-16 NOTE — Progress Notes (Addendum)
Primary Care Physician: Janeann Merl, MD Referring Physician: Device clinic Cardiologist: Dr. [name]  EP: Dr. Loraine Grip is a 75 y.o. male with a h/o CAD,  is status post CABG in 1996, s/p TAVR 02/18/16. Medtronic CRT-D placed by Dr. Lovena Le in 2016. He was found to have afib  March 22/22 for over 6.5 hours and is here as Dr. Lovena Le wanted to initiate  [place]  for a CHA2DS2VASc score of 4. He is currently on ASA. He was not aware of procedure and is on metoprolol succinate 50 mg daily at baseline. He is AV paced today. Denies a bleeding history. No alcohol/tobacco/excessive caffeine identified.   Of incidental note- pt's wife became restless and then very faint while discussing blood thinners with husband  and was placed   quickly on the stretcher but had  brief syncopal episode. She  states she has a tendency to pass out when drawing blood/seeing blood  or talking about blood. She was pale and diaphoretic for several minutes but recuperated quickly. She was given water and with satisfactory blood pressure was walked to the lobby to wait  for the remainder of the clinic visit to  be over. No further issues.   T oday, he denies symptoms of palpitations, chest pain, shortness of breath, orthopnea, PND, lower extremity edema, dizziness, presyncope, syncope, or neurologic sequela. The patient is tolerating medications without difficulties and is otherwise without complaint today.   Past Medical History:  Diagnosis Date  . Aortic stenosis   . Barrett's esophageal ulceration   . Chronic systolic CHF (congestive heart failure) ([place])   . Coronary artery disease    a. CABG 1996: LIMA graft to the LAD and diagonal, sequential vein graft to the acute marginal, PDA, and posterior lateral branches of the right coronary, sequential saphenous vein graft to the obtuse marginal vessel and distal circumflex. b. Cath 2007 patent grafts. c. s/p DES to SVG-OM2 in 03/2014.  Marland Kitchen Coronary artery disease  involving autologous vein coronary bypass graft with angina pectoris ([place]) 03/13/2014   PCI using DES in SVG to LCx system  . Dental caries    pre-heart valve surgery protocol  . Gastroesophageal reflux disease   . History of erectile dysfunction   . History of hiatal hernia   . History of kidney stones   . History of obesity   . History of prostatitis   . Hypercholesterolemia   . Hypertension   . Kidney stones   . LBBB (left bundle branch block)   . Nodule of kidney 02/04/2016   Incidental 43mm nodule upper pole right kidney noted on CT angiogram - MRI with and without gadolinium contrast recommened in 6 months  . OSA (obstructive sleep apnea)    "suppose to wear mask; I don't" (03/13/2014)  . PVC's (premature ventricular contractions)   . S/P CABG x 7 01/07/1995   LIMA to LAD-diagonal, SVG to OM-LCx, SVG to AM-PD-RPL  . S/P TAVR (transcatheter aortic valve replacement) 02/18/2016   26 mm Edwards Sapien 3 transcatheter heart valve placed via percutaneous right transfemoral approach   Past Surgical History:  Procedure Laterality Date  . [place]; ~ 2006   ; Ejection fraction is estimated at 45%  . CARDIAC CATHETERIZATION N/A 01/22/2016   Procedure: Right/Left Heart Cath and Coronary/Graft Angiography;  Surgeon: [name] [name], MD;  Location: [facility];  Service: Cardiovascular;  Laterality: N/A;  . CHOLECYSTECTOMY N/A 01/23/2014   Procedure: LAPAROSCOPIC CHOLECYSTECTOMY  WITH INTRAOPERATIVE CHOLANGIOGRAM ;  Surgeon: Fanny Skates, MD;  Location: [facility];  Service: General;  Laterality: N/A;  . COLONOSCOPY W/ POLYPECTOMY    . CORONARY ANGIOPLASTY WITH STENT PLACEMENT  03/13/2014   "1"  . CORONARY ARTERY BYPASS GRAFT  1996   "CABG X 7"  . CYSTOSCOPY WITH STENT PLACEMENT    . EP IMPLANTABLE DEVICE N/A 12/17/2014   Procedure: BiV ICD Insertion CRT-D;  Surgeon: Evans Lance, MD;  Location: [facility];  Service: Cardiovascular;  Laterality: N/A;  . LEFT  HEART CATHETERIZATION WITH CORONARY/GRAFT ANGIOGRAM N/A 03/13/2014   Procedure: LEFT HEART CATHETERIZATION WITH Beatrix Fetters;  Surgeon: [name] [name], MD;  Location: [facility] C[facility] LAB;  Service: Cardiovascular;  Laterality: N/A;  . MULTIPLE EXTRACTIONS WITH ALVEOLOPLASTY N/A 02/07/2016   Procedure: Extraction of tooth #'s 1,2,3,14,15, 18, 20, 29,30,31 with alveoloplasty and dental cleaning of teeth.;  Surgeon: Lenn Cal, DDS;  Location: [place] [facility];  Service: Oral Surgery;  Laterality: N/A;  . TEE WITHOUT CARDIOVERSION N/A 02/18/2016   Procedure: TRANSESOPHAGEAL ECHOCARDIOGRAM (TEE);  Surgeon: Sherren Mocha, MD;  Location: [facility];  Service: Open Heart Surgery;  Laterality: N/A;  . TONSILLECTOMY  ~ 1950  . TRANSCATHETER AORTIC VALVE REPLACEMENT, TRANSFEMORAL N/A 02/18/2016   Procedure: TRANSCATHETER AORTIC VALVE REPLACEMENT, TRANSFEMORAL;  Surgeon: Sherren Mocha, MD;  Location: [facility];  Service: Open Heart Surgery;  Laterality: N/A;    Current Outpatient Medications  Medication Sig Dispense Refill  . amoxicillin (AMOXIL) 500 MG tablet Take 2g (four 500 mg tablets) one hour prior to dental procedures. 4 tablet 3  . apixaban (ELIQUIS) 5 MG TABS tablet Take 1 tablet (5 mg total) by mouth 2 (two) times daily. 60 tablet 3  . Ascorbic Acid (VITAMIN C) 1000 MG tablet Take 1,000 mg by mouth daily.    Marland Kitchen atorvastatin (LIPITOR) 80 MG tablet TAKE ONE TABLET BY MOUTH DAILY 90 tablet 3  . Cholecalciferol (VITAMIN D-3) 5000 units TABS Take 5,000 Units by mouth daily.     Marland Kitchen ezetimibe (ZETIA) 10 MG tablet Take 1 tablet (10 mg total) by mouth daily. 90 tablet 3  . finasteride (PROSCAR) 5 MG tablet Take 5 mg by mouth daily.    . isosorbide mononitrate (IMDUR) 120 MG 24 hr tablet Take 1 tablet (120 mg total) by mouth 2 (two) times daily after a meal. 180 tablet 3  . metoprolol succinate (TOPROL-XL) 50 MG 24 hr tablet TAKE ONE TABLET BY MOUTH DAILY WITH OR IMMEDIATELY FOLLOWING A MEAL 90 tablet 1  . nitroGLYCERIN  (NITROSTAT) 0.4 MG SL tablet DISSOLVE 1 TAB UNDER TONGUE FOR CHEST PAIN - IF PAIN REMAINS AFTER 5 MIN, CALL 911 AND REPEAT DOSE. MAX 3 TABS IN 15 MINUTES. For 30 day supply. 200 tablet 6  . oxybutynin (DITROPAN) 5 MG tablet Take 5 mg by mouth daily.     . pantoprazole (PROTONIX) 40 MG tablet Take 1 tablet (40 mg total) by mouth daily. 90 tablet 1  . ramipril (ALTACE) 5 MG capsule Take 1 capsule (5 mg total) by mouth daily. 90 capsule 3  . ranolazine (RANEXA) 500 MG 12 hr tablet Take 1 tablet (500 mg total) by mouth 2 (two) times daily. 180 tablet 3  . valACYclovir (VALTREX) 500 MG tablet TAKE ONE TABLET BY MOUTH DAILY AS NEEDED FOR OUTBREAK 30 tablet 0   No current facility-administered medications for this encounter.    No Active Allergies  Social History   Socioeconomic History  . Marital status:  Married    Spouse name: Not on file  . Number of children: 3  . Years of education: Not on file  . Highest education level: Not on file  Occupational History  . 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 Smoking status: Former Smoker    Packs/day: 0.75    Years: 15.00    Pack years: 11.25    Types: Cigarettes    Quit date: 01/12/1997    Years since quitting: 23.3  . Smokeless tobacco: Never Used  . Tobacco comment: 3/4 pack per day. smoked 15 years off and on  Vaping Use  . Vaping Use: Never used  Substance and Sexual Activity  . Alcohol use: No    Alcohol/week: 4.0 standard drinks    Types: 4 Cans of beer per week  . Drug use: No  . Sexual activity: Yes  Other Topics Concern  . Not on file  Social History Narrative  . Not on file   Social Determinants of Health   Financial Resource Strain: Not on file  Food Insecurity: Not on file  Transportation Needs: Not on file  Physical Activity: Not on file  Stress: Not on file  Social Connections: Not on file  Intimate Partner Violence: Not on file    Family History  Problem Relation Age of  Onset  . Heart attack Father   . Cancer Sister        Uterine Cancer    ROS- All systems are reviewed and negative except as per the HPI above  Physical Exam: Vitals:   05/16/20 0933  BP: 114/70  Pulse: 60  Weight: 98.7 kg  Height: 5\' 10"  (1.778 m)   Wt Readings from Last 3 Encounters:  05/16/20 98.7 kg  04/10/20 100.7 kg  11/06/19 90.7 kg    Labs: Lab Results  Component Value Date   NA 141 04/10/2020   K 4.7 04/10/2020   CL 102 04/10/2020   CO2 24 04/10/2020   GLUCOSE 105 (H) 04/10/2020   BUN 21 04/10/2020   CREATININE 1.16 04/10/2020   CALCIUM 9.4 04/10/2020   MG 2.0 05/02/2019   Lab Results  Component Value Date   INR 2.4 09/16/2016   Lab Results  Component Value Date   CHOL 151 04/10/2020   HDL 51 04/10/2020   LDLCALC 84 04/10/2020   TRIG 82 04/10/2020     GEN- The patient is well appearing, alert and oriented x 3 today.   Head- normocephalic, atraumatic Eyes-  Sclera clear, conjunctiva pink Ears- hearing intact Oropharynx- clear Neck- supple, no JVP Lymph- no cervical lymphadenopathy Lungs- Clear to ausculation bilaterally, normal work of breathing Heart- Regular rate and rhythm, no murmurs, rubs or gallops, PMI not laterally displaced GI- soft, NT, ND, + BS Extremities- no clubbing, cyanosis, or edema MS- no significant deformity or atrophy Skin- no rash or lesion Psych- euthymic mood, full affect Neuro- strength and sensation are intact  EKG- AV  dual paced rhythm at 60 bpm, pr int 148 ms, qrs int 178 ms, qtc 512 ms     Assessment and Plan: 1. New onset  afib  General education re afib  Low afib burden  Unaware of episode  Already on BB's at baseline Echo ordered to update   2. CHA2DS2VASc of 4  Discussed anticoagulation with pt and risk of stroke  No bleeding tendency He will stop ASA , discussed with Dr. [name]  He will start eliquis 5  mg bid  Bleeding precautions discussed   Cbc/cmet from  03/2020 reviewed    F/u with Dr.  Lovena Le per schedule 06/04/20 and Dr. [name] per recall   [name], [facility] [address] [place], [place] [address] [phone]

## 2020-05-16 NOTE — Patient Instructions (Signed)
Stop aspirin  Start Eliquis 5 mg twice a day.

## 2020-05-30 NOTE — Progress Notes (Signed)
Remote ICD transmission.   

## 2020-06-04 ENCOUNTER — Encounter: Payer: Self-pay | Admitting: Internal Medicine

## 2020-06-04 ENCOUNTER — Other Ambulatory Visit: Payer: Self-pay

## 2020-06-04 ENCOUNTER — Ambulatory Visit: Payer: Medicare HMO | Admitting: Internal Medicine

## 2020-06-04 VITALS — BP 108/60 | HR 64 | Ht 70.0 in | Wt 219.8 lb

## 2020-06-04 DIAGNOSIS — Z95810 Presence of automatic (implantable) cardiac defibrillator: Secondary | ICD-10-CM | POA: Diagnosis not present

## 2020-06-04 DIAGNOSIS — I5022 Chronic systolic (congestive) heart failure: Secondary | ICD-10-CM

## 2020-06-04 DIAGNOSIS — I447 Left bundle-branch block, unspecified: Secondary | ICD-10-CM | POA: Diagnosis not present

## 2020-06-04 NOTE — Patient Instructions (Signed)
Medication Instructions:  Your physician recommends that you continue on your current medications as directed. Please refer to the Current Medication list given to you today.  Labwork: None ordered.  Testing/Procedures: None ordered.  Follow-Up: Your physician wants you to follow-up in: one year with [name], MD or one of the following Advanced Practice Providers on your designated Care Team:    Chanetta Marshall, NP  Tommye Standard, PA-C  Legrand Como "Jonni Sanger" [place], [place]  Remote monitoring is used to monitor your ICD from home. This monitoring reduces the number of office visits required to check your device to one time per year. It allows [place] to keep an eye on the functioning of your device to ensure it is working properly. You are scheduled for a device check from home on 08/08/2020. You may send your transmission at any time that day. If you have a wireless device, the transmission will be sent automatically. After your physician reviews your transmission, you will receive a postcard with your next transmission date.  Any Other Special Instructions Will Be Listed Below (If Applicable).  If you need a refill on your cardiac medications before your next appointment, please call your pharmacy.

## 2020-06-04 NOTE — Progress Notes (Signed)
HPI [name] returns today for followup. He is a pleasant 75 yo man with a h/o chronic systolic heart failure, CAD, s/p CABG remotely, s/p TAVR in 2/18, s/p Medtronic biv ICD in 2016. He developed atrial fib and was placed on Eliquis. He denies covid vaccination. He has not been sick.  No Active Allergies   Current Outpatient Medications  Medication Sig Dispense Refill  . amoxicillin (AMOXIL) 500 MG tablet Take 2g (four 500 mg tablets) one hour prior to dental procedures. 4 tablet 3  . apixaban (ELIQUIS) 5 MG TABS tablet Take 1 tablet (5 mg total) by mouth 2 (two) times daily. 60 tablet 3  . Ascorbic Acid (VITAMIN C) 1000 MG tablet Take 1,000 mg by mouth daily.    Marland Kitchen atorvastatin (LIPITOR) 80 MG tablet TAKE ONE TABLET BY MOUTH DAILY 90 tablet 3  . Cholecalciferol (VITAMIN D-3) 5000 units TABS Take 5,000 Units by mouth daily.     Marland Kitchen ezetimibe (ZETIA) 10 MG tablet Take 1 tablet (10 mg total) by mouth daily. 90 tablet 3  . finasteride (PROSCAR) 5 MG tablet Take 5 mg by mouth daily.    . isosorbide mononitrate (IMDUR) 120 MG 24 hr tablet Take 1 tablet (120 mg total) by mouth 2 (two) times daily after a meal. 180 tablet 3  . metoprolol succinate (TOPROL-XL) 50 MG 24 hr tablet TAKE ONE TABLET BY MOUTH DAILY WITH OR IMMEDIATELY FOLLOWING A MEAL 90 tablet 1  . nitroGLYCERIN (NITROSTAT) 0.4 MG SL tablet DISSOLVE 1 TAB UNDER TONGUE FOR CHEST PAIN - IF PAIN REMAINS AFTER 5 MIN, CALL 911 AND REPEAT DOSE. MAX 3 TABS IN 15 MINUTES. For 30 day supply. 200 tablet 6  . oxybutynin (DITROPAN) 5 MG tablet Take 5 mg by mouth daily.     . pantoprazole (PROTONIX) 40 MG tablet Take 1 tablet (40 mg total) by mouth daily. 90 tablet 1  . ramipril (ALTACE) 5 MG capsule Take 1 capsule (5 mg total) by mouth daily. 90 capsule 3  . ranolazine (RANEXA) 500 MG 12 hr tablet Take 1 tablet (500 mg total) by mouth 2 (two) times daily. 180 tablet 3  . valACYclovir (VALTREX) 500 MG tablet TAKE ONE TABLET BY MOUTH DAILY AS  NEEDED FOR OUTBREAK 30 tablet 0   No current facility-administered medications for this visit.     Past Medical History:  Diagnosis Date  . Aortic stenosis   . Barrett's esophageal ulceration   . Chronic systolic CHF (congestive heart failure) ([place])   . Coronary artery disease    a. CABG 1996: LIMA graft to the LAD and diagonal, sequential vein graft to the acute marginal, PDA, and posterior lateral branches of the right coronary, sequential saphenous vein graft to the obtuse marginal vessel and distal circumflex. b. Cath 2007 patent grafts. c. s/p DES to SVG-OM2 in 03/2014.  Marland Kitchen Coronary artery disease involving autologous vein coronary bypass graft with angina pectoris ([place]) 03/13/2014   PCI using DES in SVG to LCx system  . Dental caries    pre-heart valve surgery protocol  . Gastroesophageal reflux disease   . History of erectile dysfunction   . History of hiatal hernia   . History of kidney stones   . History of obesity   . History of prostatitis   . Hypercholesterolemia   . Hypertension   . Kidney stones   . LBBB (left bundle branch block)   . Nodule of kidney 02/04/2016   Incidental 79mm nodule upper  pole right kidney noted on CT angiogram - MRI with and without gadolinium contrast recommened in 6 months  . OSA (obstructive sleep apnea)    "suppose to wear mask; I don't" (03/13/2014)  . PVC's (premature ventricular contractions)   . S/P CABG x 7 01/07/1995   LIMA to LAD-diagonal, SVG to OM-LCx, SVG to AM-PD-RPL  . S/P TAVR (transcatheter aortic valve replacement) 02/18/2016   26 mm Edwards Sapien 3 transcatheter heart valve placed via percutaneous right transfemoral approach    ROS:   All systems reviewed and negative except as noted in the HPI.   Past Surgical History:  Procedure Laterality Date  . [place]; ~ 2006   ; Ejection fraction is estimated at 45%  . CARDIAC CATHETERIZATION N/A 01/22/2016   Procedure: Right/Left Heart Cath and Coronary/Graft  Angiography;  Surgeon: [name] [name], MD;  Location: [facility];  Service: Cardiovascular;  Laterality: N/A;  . CHOLECYSTECTOMY N/A 01/23/2014   Procedure: LAPAROSCOPIC CHOLECYSTECTOMY WITH INTRAOPERATIVE CHOLANGIOGRAM ;  Surgeon: Fanny Skates, MD;  Location: [facility];  Service: General;  Laterality: N/A;  . COLONOSCOPY W/ POLYPECTOMY    . CORONARY ANGIOPLASTY WITH STENT PLACEMENT  03/13/2014   "1"  . CORONARY ARTERY BYPASS GRAFT  1996   "CABG X 7"  . CYSTOSCOPY WITH STENT PLACEMENT    . EP IMPLANTABLE DEVICE N/A 12/17/2014   Procedure: BiV ICD Insertion CRT-D;  Surgeon: Evans Lance, MD;  Location: [facility];  Service: Cardiovascular;  Laterality: N/A;  . LEFT HEART CATHETERIZATION WITH CORONARY/GRAFT ANGIOGRAM N/A 03/13/2014   Procedure: LEFT HEART CATHETERIZATION WITH Beatrix Fetters;  Surgeon: [name] [name], MD;  Location[facility];  Service: Cardiovascular;  Laterality: N/A;  . MULTIPLE EXTRACTIONS WITH ALVEOLOPLASTY N/A 02/07/2016   Procedure: Extraction of tooth #'s 1,2,3,14,15, 18, 20, 29,30,31 with alveoloplasty and dental cleaning of teeth.;  Surgeon: Lenn Cal, DDS;  Location: [place] [facility];  Service: Oral Surgery;  Laterality: N/A;  . TEE WITHOUT CARDIOVERSION N/A 02/18/2016   Procedure: TRANSESOPHAGEAL ECHOCARDIOGRAM (TEE);  Surgeon: Sherren Mocha, MD;  Location: [facility];  Service: Open Heart Surgery;  Laterality: N/A;  . TONSILLECTOMY  ~ 1950  . TRANSCATHETER AORTIC VALVE REPLACEMENT, TRANSFEMORAL N/A 02/18/2016   Procedure: TRANSCATHETER AORTIC VALVE REPLACEMENT, TRANSFEMORAL;  Surgeon: Sherren Mocha, MD;  Location: [facility];  Service: Open Heart Surgery;  Laterality: N/A;     Family History  Problem Relation Age of Onset  . Heart attack Father   . Cancer Sister        Uterine Cancer     Social History   Socioeconomic History  . Marital status: Married    Spouse name: Not on file  . Number of children: 3  . Years of education: Not on file  .  Highest education level: Not on file  Occupational History  . 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 Smoking status: Former Smoker    Packs/day: 0.75    Years: 15.00    Pack years: 11.25    Types: Cigarettes    Quit date: 01/12/1997    Years since quitting: 23.4  . Smokeless tobacco: Never Used  . Tobacco comment: 3/4 pack per day. smoked 15 years off and on  Vaping Use  . Vaping Use: Never used  Substance and Sexual Activity  . Alcohol use: No    Alcohol/week: 4.0 standard drinks    Types: 4 Cans  of beer per week  . Drug use: No  . Sexual activity: Yes  Other Topics Concern  . Not on file  Social History Narrative  . Not on file   Social Determinants of Health   Financial Resource Strain: Not on file  Food Insecurity: Not on file  Transportation Needs: Not on file  Physical Activity: Not on file  Stress: Not on file  Social Connections: Not on file  Intimate Partner Violence: Not on file     BP 108/60   Pulse 64   Ht 5\' 10"  (1.778 m)   Wt 219 lb 12.8 oz (99.7 kg)   SpO2 98%   BMI 31.54 kg/m   Physical Exam:  Well appearing NAD HEENT: Unremarkable Neck:  No JVD, no thyromegally Lymphatics:  No adenopathy Back:  No CVA tenderness Lungs:  Clear with no wheezes HEART:  Regular rate rhythm, no murmurs, no rubs, no clicks Abd:  soft, positive bowel sounds, no organomegally, no rebound, no guarding Ext:  2 plus pulses, no edema, no cyanosis, no clubbing Skin:  No rashes no nodules Neuro:  CN II through XII intact, motor grossly intact  DEVICE  Normal device function.  See PaceArt for details.   Assess/Plan: 1. Chronic systolic heart failure - his symptoms are class 2. He will continue his current meds.  2. PAF - he has not had any more symptoms and he appears to be maintaining NSR. He will continue eliquis. 3. ICD - his medtronic biv ICD is working normally. He is about 18 months from ERI.  4. Dyslipidemia - he will  continue lipitor. He will maintain a heart healthy diet.  Carleene Overlie Sharai [place],MD

## 2020-06-25 ENCOUNTER — Ambulatory Visit (HOSPITAL_COMMUNITY)
Admission: RE | Admit: 2020-06-25 | Discharge: 2020-06-25 | Disposition: A | Discharge status: Home / Self Care | Payer: Medicare HMO | Source: Ambulatory Visit | Attending: Nurse Practitioner | Admitting: Nurse Practitioner

## 2020-06-25 ENCOUNTER — Other Ambulatory Visit: Payer: Self-pay

## 2020-06-25 DIAGNOSIS — I4891 Unspecified atrial fibrillation: Secondary | ICD-10-CM | POA: Diagnosis not present

## 2020-06-25 DIAGNOSIS — Z950 Presence of cardiac pacemaker: Secondary | ICD-10-CM | POA: Insufficient documentation

## 2020-06-25 DIAGNOSIS — I480 Paroxysmal atrial fibrillation: Secondary | ICD-10-CM | POA: Diagnosis not present

## 2020-06-25 DIAGNOSIS — Z951 Presence of aortocoronary bypass graft: Secondary | ICD-10-CM | POA: Insufficient documentation

## 2020-06-25 DIAGNOSIS — I080 Rheumatic disorders of both mitral and aortic valves: Secondary | ICD-10-CM | POA: Insufficient documentation

## 2020-06-25 LAB — ECHOCARDIOGRAM COMPLETE
AR max vel: 1.62 cm2
AV Area VTI: 1.44 cm2
AV Area mean vel: 1.5 cm2
AV Mean grad: 6 mmHg
AV Peak grad: 11.4 mmHg
Ao pk vel: 1.69 m/s
Area-P 1/2: 4.31 cm2
Calc EF: 24.8 %
S' Lateral: 5.2 cm
Single Plane A2C EF: 34.2 %
Single Plane A4C EF: 20 %

## 2020-06-25 MED ORDER — PERFLUTREN LIPID MICROSPHERE
1.0000 mL | INTRAVENOUS | Status: AC | PRN
  Administered 2020-06-25: 2 mL via INTRAVENOUS
  Filled 2020-06-25: qty 10

## 2020-07-11 DIAGNOSIS — E785 Hyperlipidemia, unspecified: Secondary | ICD-10-CM | POA: Diagnosis not present

## 2020-07-11 DIAGNOSIS — I25119 Atherosclerotic heart disease of native coronary artery with unspecified angina pectoris: Secondary | ICD-10-CM | POA: Diagnosis not present

## 2020-07-11 LAB — HEPATIC FUNCTION PANEL
ALT: 21 IU/L (ref 0–44)
AST: 38 IU/L (ref 0–40)
Albumin: 4.2 g/dL (ref 3.7–4.7)
Alkaline Phosphatase: 57 IU/L (ref 44–121)
Bilirubin Total: 1.6 mg/dL — ABNORMAL HIGH (ref 0.0–1.2)
Bilirubin, Direct: 0.38 mg/dL (ref 0.00–0.40)
Total Protein: 6.3 g/dL (ref 6.0–8.5)

## 2020-07-11 LAB — LIPID PANEL
Chol/HDL Ratio: 2.9 ratio (ref 0.0–5.0)
Cholesterol, Total: 120 mg/dL (ref 100–199)
HDL: 41 mg/dL (ref >39)
LDL Chol Calc (NIH): 65 mg/dL (ref 0–99)
Triglycerides: 65 mg/dL (ref 0–149)
VLDL Cholesterol Cal: 14 mg/dL (ref 5–40)

## 2020-08-08 ENCOUNTER — Ambulatory Visit (INDEPENDENT_AMBULATORY_CARE_PROVIDER_SITE_OTHER): Payer: Medicare HMO

## 2020-08-08 DIAGNOSIS — I480 Paroxysmal atrial fibrillation: Secondary | ICD-10-CM | POA: Diagnosis not present

## 2020-08-09 LAB — CUP PACEART REMOTE DEVICE CHECK
Battery Remaining Longevity: 17 mo
Battery Voltage: 2.91 V
Brady Statistic AP VP Percent: 71.36 %
Brady Statistic AP VS Percent: 0.02 %
Brady Statistic AS VP Percent: 28.6 %
Brady Statistic AS VS Percent: 0.02 %
Brady Statistic RA Percent Paced: 70.78 %
Brady Statistic RV Percent Paced: 99.29 %
Date Time Interrogation Session: 20220728033426
HighPow Impedance: 65 Ohm
Implantable Lead Implant Date: 20161205
Implantable Lead Implant Date: 20161205
Implantable Lead Implant Date: 20161205
Implantable Lead Location: 753858
Implantable Lead Location: 753859
Implantable Lead Location: 753860
Implantable Lead Model: 4598
Implantable Lead Model: 5076
Implantable Lead Model: 6935
Implantable Pulse Generator Implant Date: 20161205
Lead Channel Impedance Value: 304 Ohm
Lead Channel Impedance Value: 342 Ohm
Lead Channel Impedance Value: 361 Ohm
Lead Channel Impedance Value: 361 Ohm
Lead Channel Impedance Value: 399 Ohm
Lead Channel Impedance Value: 399 Ohm
Lead Channel Impedance Value: 418 Ohm
Lead Channel Impedance Value: 456 Ohm
Lead Channel Impedance Value: 589 Ohm
Lead Channel Impedance Value: 589 Ohm
Lead Channel Impedance Value: 608 Ohm
Lead Channel Impedance Value: 665 Ohm
Lead Channel Impedance Value: 665 Ohm
Lead Channel Pacing Threshold Amplitude: 0.5 V
Lead Channel Pacing Threshold Amplitude: 0.625 V
Lead Channel Pacing Threshold Amplitude: 0.875 V
Lead Channel Pacing Threshold Pulse Width: 0.4 ms
Lead Channel Pacing Threshold Pulse Width: 0.4 ms
Lead Channel Pacing Threshold Pulse Width: 0.4 ms
Lead Channel Sensing Intrinsic Amplitude: 0.875 mV
Lead Channel Sensing Intrinsic Amplitude: 0.875 mV
Lead Channel Sensing Intrinsic Amplitude: 9.5 mV
Lead Channel Sensing Intrinsic Amplitude: 9.5 mV
Lead Channel Setting Pacing Amplitude: 2 V
Lead Channel Setting Pacing Amplitude: 2 V
Lead Channel Setting Pacing Amplitude: 2.5 V
Lead Channel Setting Pacing Pulse Width: 0.4 ms
Lead Channel Setting Pacing Pulse Width: 0.4 ms
Lead Channel Setting Sensing Sensitivity: 0.3 mV

## 2020-08-27 DIAGNOSIS — R300 Dysuria: Secondary | ICD-10-CM | POA: Diagnosis not present

## 2020-09-02 ENCOUNTER — Other Ambulatory Visit: Payer: Self-pay | Admitting: Cardiology

## 2020-09-02 DIAGNOSIS — I5022 Chronic systolic (congestive) heart failure: Secondary | ICD-10-CM

## 2020-09-04 NOTE — Progress Notes (Signed)
Remote ICD transmission.   

## 2020-09-08 ENCOUNTER — Other Ambulatory Visit: Payer: Self-pay | Admitting: Cardiology

## 2020-09-08 ENCOUNTER — Other Ambulatory Visit (HOSPITAL_COMMUNITY): Payer: Self-pay | Admitting: Nurse Practitioner

## 2020-09-09 NOTE — Telephone Encounter (Signed)
Prescription refill request for Eliquis received. Indication:Afib Last office visit:06/04/20 Lovena Le)  Scr: 1.16 (04/10/20) Age: 75 Weight: 99.7kg  Appropriate dose and refill sent to requested pharmacy.

## 2020-10-03 ENCOUNTER — Other Ambulatory Visit: Payer: Self-pay | Admitting: Cardiology

## 2020-10-03 DIAGNOSIS — I350 Nonrheumatic aortic (valve) stenosis: Secondary | ICD-10-CM

## 2020-10-26 ENCOUNTER — Other Ambulatory Visit: Payer: Self-pay | Admitting: Cardiology

## 2020-10-29 ENCOUNTER — Encounter: Payer: Self-pay | Admitting: Cardiology

## 2020-10-29 ENCOUNTER — Telehealth: Payer: Self-pay | Admitting: Cardiology

## 2020-10-29 ENCOUNTER — Ambulatory Visit: Payer: Medicare HMO | Admitting: Cardiology

## 2020-10-29 ENCOUNTER — Other Ambulatory Visit: Payer: Self-pay

## 2020-10-29 VITALS — BP 140/86 | HR 72 | Ht 70.0 in | Wt 222.0 lb

## 2020-10-29 DIAGNOSIS — Z952 Presence of prosthetic heart valve: Secondary | ICD-10-CM | POA: Diagnosis not present

## 2020-10-29 DIAGNOSIS — E785 Hyperlipidemia, unspecified: Secondary | ICD-10-CM

## 2020-10-29 DIAGNOSIS — I25119 Atherosclerotic heart disease of native coronary artery with unspecified angina pectoris: Secondary | ICD-10-CM

## 2020-10-29 DIAGNOSIS — I480 Paroxysmal atrial fibrillation: Secondary | ICD-10-CM

## 2020-10-29 DIAGNOSIS — I447 Left bundle-branch block, unspecified: Secondary | ICD-10-CM | POA: Diagnosis not present

## 2020-10-29 DIAGNOSIS — I5043 Acute on chronic combined systolic (congestive) and diastolic (congestive) heart failure: Secondary | ICD-10-CM

## 2020-10-29 MED ORDER — FUROSEMIDE 20 MG PO TABS: 20.0000 mg | ORAL_TABLET | Freq: Every day | ORAL | 3 refills | Status: DC

## 2020-10-29 MED ORDER — ENTRESTO 24-26 MG PO TABS: 1.0000 | ORAL_TABLET | Freq: Two times a day (BID) | ORAL | 3 refills | Status: DC

## 2020-10-29 NOTE — Patient Instructions (Signed)
Restrict your sodium intake.  Monitor your weight daily  Stop taking ramipril  After you have been off ramipril for at least 3 days then start Entresto 24/26 mg twice a day  Start lasix 20 mg daily  We will check your potassium and kidney function in 2 weeks  I will have you follow up with our pharm D to adjust your meds  I will see you in 2 months

## 2020-10-29 NOTE — Telephone Encounter (Signed)
Pt c/o Shortness Of Breath: STAT if SOB developed within the last 24 hours or pt is noticeably SOB on the phone  1. Are you currently SOB (can you hear that pt is SOB on the phone)? Yes, cannot hear over the phone   2. How long have you been experiencing SOB? Couple days   3. Are you SOB when sitting or when up moving around? All the time   4. Are you currently experiencing any other symptoms? Congested with a cough

## 2020-10-29 NOTE — Telephone Encounter (Signed)
Spoke to patient he stated he has been sob,wheezing for the past 4 to 5 days. No cough.He always has chest pain,chest pain no worse.He has swelling in both lower legs,no worse.Stated he feels bad and would like to see Dr.Jordan.Appointment scheduled with Dr.Jordan this afternoon at 4:20 am.

## 2020-10-29 NOTE — Progress Notes (Signed)
[name] Date of Birth: 1945/09/19 Medical Record #[id number]  History of Present Illness: [name] is seen for evaluation of chest pain.  He has a known history of coronary disease and is status post CABG in 1996. This included an LIMA graft to the LAD and diagonal, sequential vein graft to the acute marginal, PDA, and posterior lateral branches of the right coronary, sequential saphenous vein graft to the obtuse marginal vessel and distal circumflex. He had a cardiac catheterization in 2007 which showed that his grafts were all patent. On 04/02/14 he had repeat cath for chest pain and grafts were again patent but there was a 90% stenosis in the SVG to OM2. This was stented with a DES.  Despite this intervention he has continued to have chest pain. He had cholecystectomy in January 9024 without complications and without relief of his pain. CXR was unremarkable. CT of the chest was negative for PE or aortic pathology. He had EGD by Dr. Oletta Lamas that showed Barrett's esophagus but no evidence of esophagitis. He is on Protonix.   In Dec 2016  he was admitted for evaluation of syncope. Echo showed EF 30-35% with moderate to severe AS. Mean gradient 24 mm Hg. AVA 0.8-0.9. There was concern that AVA was underestimated due to low output. EEG was negative. He had a LBBB. He underwent placement of a BIV-ICD by Dr. Lovena Le.  He then  had a repeat Echo showing some increase in AV gradient. He underwent a Dobutamine Echo. This showed improvement in EF to 40-45%. There was also increase in AV gradient with a mean gradient of 41 mm Hg and severe AS. He underwent uncomplicated TAVR 09/73/5329 via a percutaneous right transfemoral approach. He was treated with a 26 mm Edwards Sapien 3 transcatheter heart valve. Repeat Echo in March 2018 showed improvement in EF to 40-45%. Echo in February 2019 was unchanged.   Despite all these interventions he has continued to have chest pain. He previously described his pain as  right parasternal radiating to his jaw. It is relieved with sl Ntg. Typically occurs at night. He was able to do heavy yard work such as lifting, hauling, and blowing leaves without change. He does have some SOB. We did switch to him taking Imdur at bedtime and this seemed to help.  Pacemaker check in January was stable.   On pacer check in April he was noted to have AFib lasting 6.5 hours. He was asymptomatic. Was seen in Afib clinic and started on Eliquis. Echo was updated and showed significant worsening of LV function with EF 25-30%. Mild to moderate MR. TAVR functioning well. Severe LAE and moderate RAE. Seen today with complaints of SOB. His pacer check in July showed no recurrent Afib.   He reports that he is more SOB. Has a hard time doing any yard work without stopping. Gets SOB at night and has been sleeping sitting up. Notes LE edema but has attributed this to his neuropathy. He continues to have chest pain which he has had for years. Takes up to 200 sl Ntg a month. States the pattern has not changed.    Current Outpatient Medications on File Prior to Visit  Medication Sig Dispense Refill   amoxicillin (AMOXIL) 500 MG tablet Take 2g (four 500 mg tablets) one hour prior to dental procedures. 4 tablet 3   Ascorbic Acid (VITAMIN C) 1000 MG tablet Take 1,000 mg by mouth daily.     atorvastatin (LIPITOR) 80 MG tablet TAKE ONE TABLET  BY MOUTH DAILY 90 tablet 3   Cholecalciferol (VITAMIN D-3) 5000 units TABS Take 5,000 Units by mouth daily.      ELIQUIS 5 MG TABS tablet TAKE ONE TABLET BY MOUTH TWICE A DAY 60 tablet 3   finasteride (PROSCAR) 5 MG tablet Take 5 mg by mouth daily.     isosorbide mononitrate (IMDUR) 120 MG 24 hr tablet TAKE ONE TABLET BY MOUTH DAILY 90 tablet 2   metoprolol succinate (TOPROL-XL) 50 MG 24 hr tablet TAKE ONE TABLET BY MOUTH DAILY WITH OR IMMEDIATELY FOLLOWING A MEAL 90 tablet 3   nitroGLYCERIN (NITROSTAT) 0.4 MG SL tablet DISSOLVE 1 TABLET UNDER TONGUE FOR CHEST  PAIN - IF PAIN REMAINS AFTER 5 MIN, CALL 911 AND REPEAT DOSE. MAX 3 TABLETS IN 15 MINUTES FOR 30 DAY SUPPLY 200 tablet 6   oxybutynin (DITROPAN) 5 MG tablet Take 5 mg by mouth daily.      pantoprazole (PROTONIX) 40 MG tablet TAKE ONE TABLET BY MOUTH DAILY 90 tablet 1   ranolazine (RANEXA) 500 MG 12 hr tablet Take 1 tablet (500 mg total) by mouth 2 (two) times daily. 180 tablet 3   valACYclovir (VALTREX) 500 MG tablet TAKE ONE TABLET BY MOUTH DAILY AS NEEDED FOR OUTBREAK 30 tablet 0   ezetimibe (ZETIA) 10 MG tablet Take 1 tablet (10 mg total) by mouth daily. 90 tablet 3   No current facility-administered medications on file prior to visit.    No Active Allergies   Past Medical History:  Diagnosis Date   Aortic stenosis    Barrett's esophageal ulceration    Chronic systolic CHF (congestive heart failure) (HCC)    Coronary artery disease    a. CABG 1996: LIMA graft to the LAD and diagonal, sequential vein graft to the acute marginal, PDA, and posterior lateral branches of the right coronary, sequential saphenous vein graft to the obtuse marginal vessel and distal circumflex. b. Cath 2007 patent grafts. c. s/p DES to SVG-OM2 in 03/2014.   Coronary artery disease involving autologous vein coronary bypass graft with angina pectoris ([place]) 03/13/2014   PCI using DES in SVG to LCx system   Dental caries    pre-heart valve surgery protocol   Gastroesophageal reflux disease    History of erectile dysfunction    History of hiatal hernia    History of kidney stones    History of obesity    History of prostatitis    Hypercholesterolemia    Hypertension    Kidney stones    LBBB (left bundle branch block)    Nodule of kidney 02/04/2016   Incidental 79mm nodule upper pole right kidney noted on CT angiogram - MRI with and without gadolinium contrast recommened in 6 months   OSA (obstructive sleep apnea)    "suppose to wear mask; I don't" (03/13/2014)   PVC's (premature ventricular contractions)     S/P CABG x 7 01/07/1995   LIMA to LAD-diagonal, SVG to OM-LCx, SVG to AM-PD-RPL   S/P TAVR (transcatheter aortic valve replacement) 02/18/2016   26 mm Edwards Sapien 3 transcatheter heart valve placed via percutaneous right transfemoral approach    Past Surgical History:  Procedure Laterality Date   [place]; ~ 2006   ; Ejection fraction is estimated at 45%   CARDIAC CATHETERIZATION N/A 01/22/2016   Procedure: Right/Left Heart Cath and Coronary/Graft Angiography;  Surgeon: Dewain Platz M [name], MD;  Location: [facility];  Service: Cardiovascular;  Laterality: N/A;   CHOLECYSTECTOMY N/A 01/23/2014  Procedure: LAPAROSCOPIC CHOLECYSTECTOMY WITH INTRAOPERATIVE CHOLANGIOGRAM ;  Surgeon: Fanny Skates, MD;  Location: [facility];  Service: General;  Laterality: N/A;   COLONOSCOPY W/ POLYPECTOMY     CORONARY ANGIOPLASTY WITH STENT PLACEMENT  03/13/2014   "1"   CORONARY ARTERY BYPASS GRAFT  1996   "CABG X 7"   CYSTOSCOPY WITH STENT PLACEMENT     EP IMPLANTABLE DEVICE N/A 12/17/2014   Procedure: BiV ICD Insertion CRT-D;  Surgeon: Evans Lance, MD;  Location: [facility];  Service: Cardiovascular;  Laterality: N/A;   LEFT HEART CATHETERIZATION WITH CORONARY/GRAFT ANGIOGRAM N/A 03/13/2014   Procedure: LEFT HEART CATHETERIZATION WITH Beatrix Fetters;  Surgeon: Lexy Meininger M [name], MD;  Location: [facility];  Service: Cardiovascular;  Laterality: N/A;   MULTIPLE EXTRACTIONS WITH ALVEOLOPLASTY N/A 02/07/2016   Procedure: Extraction of tooth #'s 1,2,3,14,15, 18, 20, 29,30,31 with alveoloplasty and dental cleaning of teeth.;  Surgeon: Lenn Cal, DDS;  Location: [facility];  Service: Oral Surgery;  Laterality: N/A;   TEE WITHOUT CARDIOVERSION N/A 02/18/2016   Procedure: TRANSESOPHAGEAL ECHOCARDIOGRAM (TEE);  Surgeon: Sherren Mocha, MD;  Location: [facility];  Service: Open Heart Surgery;  Laterality: N/A;   TONSILLECTOMY  ~ 1950   TRANSCATHETER AORTIC VALVE REPLACEMENT, TRANSFEMORAL  N/A 02/18/2016   Procedure: TRANSCATHETER AORTIC VALVE REPLACEMENT, TRANSFEMORAL;  Surgeon: Sherren Mocha, MD;  Location: [facility];  Service: Open Heart Surgery;  Laterality: N/A;    Social History   Tobacco Use  Smoking Status Former   Packs/day: 0.75   Years: 15.00   Pack years: 11.25   Types: Cigarettes   Quit date: 01/12/1997   Years since quitting: 23.8  Smokeless Tobacco Never  Tobacco Comments   3/4 pack per day. smoked 15 years off and on    Social History   Substance and Sexual Activity  Alcohol Use No   Alcohol/week: 4.0 standard drinks   Types: 4 Cans of beer per week    Family History  Problem Relation Age of Onset   Heart attack Father    Cancer Sister        Uterine Cancer    Review of Systems: As noted in HPI.  All other systems were reviewed and are negative.  Physical Exam: BP 140/86   Pulse 72   Ht 5\' 10"  (1.778 m)   Wt 222 lb (100.7 kg)   SpO2 95%   BMI 31.85 kg/m  GENERAL:  Well appearing WM in NAD HEENT:  PERRL, EOMI, sclera are clear. Oropharynx is clear. NECK:  No jugular venous distention, carotid upstroke brisk and symmetric, no bruits, no thyromegaly or adenopathy LUNGS:  Clear to auscultation bilaterally CHEST:  Unremarkable HEART:  RRR,  PMI not displaced or sustained,S1 and S2 within normal limits, no S3, no S4: no clicks, no rubs, gr 1/6 systolic murmur RUSB. ABD:  Soft, nontender. BS +, no masses or bruits. No hepatomegaly, no splenomegaly EXT:  2 + pulses throughout, 2+ edema R>L, no cyanosis no clubbing SKIN:  Warm and dry.  No rashes NEURO:  Alert and oriented x 3. Cranial nerves II through XII intact. PSYCH:  Cognitively intact    LABORATORY DATA:   Lab Results  Component Value Date   WBC 5.0 04/10/2020   HGB 14.1 04/10/2020   HCT 42.6 04/10/2020   PLT 189 04/10/2020   GLUCOSE 105 (H) 04/10/2020   CHOL 120 07/11/2020   TRIG 65 07/11/2020   HDL 41 07/11/2020   LDLCALC 65 07/11/2020   ALT  21 07/11/2020   AST 38  07/11/2020   NA 141 04/10/2020   K 4.7 04/10/2020   CL 102 04/10/2020   CREATININE 1.16 04/10/2020   BUN 21 04/10/2020   CO2 24 04/10/2020   TSH 1.572 12/16/2014   INR 2.4 09/16/2016   HGBA1C 6.0 (H) 02/14/2016   Ecg today shows AV pacing 61. I have personally reviewed and interpreted this study.   Echo dated 03/13/16: Study Conclusions   - Left ventricle: The cavity size was normal. There was moderate   concentric hypertrophy. Systolic function was mildly to   moderately reduced. The estimated ejection fraction was in the   range of 40% to 45%. Hypokinesis of the anterolateral and   inferolateral myocardium; consistent with ischemia in the   distribution of the left circumflex coronary artery. Doppler   parameters are consistent with abnormal left ventricular   relaxation (grade 1 diastolic dysfunction). Doppler parameters   are consistent with elevated mean left atrial filling pressure. - Ventricular septum: Septal motion showed paradox. These changes   are consistent with a left bundle branch block. - Aortic valve: A stent-valve bioprosthesis (TAVR) was present and   functioning normally. - Left atrium: The atrium was severely dilated. - Right ventricle: The cavity size was mildly dilated.   Impressions:   - Aortic valve gradients are similar to the 02/18/2016 study.   ------------------------------------------------------------------- Labs, prior tests, procedures, and surgery: Status post TAVR (26 mm Edwards Sapien 3 Transcatheter Bioprosthesis model # [id number]; serial # [id number]).  Coronary artery bypass grafting.  Echo 02/17/17: Study Conclusions   - Left ventricle: The cavity size was normal. Wall thickness was   increased in a pattern of mild LVH. Systolic function was mildly   to moderately reduced. The estimated ejection fraction was in the   range of 40% to 45%. Diffuse hypokinesis. Doppler parameters are   consistent with abnormal left ventricular relaxation  (grade 1   diastolic dysfunction). - Aortic valve: Bioprosthetic aortic valve s/p TAVR. Trivial   perivalvular aortic regurgitation. No significant stenosis. Mean   gradient (S): 15 mm Hg. - Mitral valve: There was no significant regurgitation. - Left atrium: The atrium was mildly to moderately dilated. - Right ventricle: The cavity size was mildly dilated. Pacer wire   or catheter noted in right ventricle. Systolic function was   mildly reduced. - Right atrium: The atrium was mildly dilated. - Tricuspid valve: Peak RV-RA gradient (S): 25 mm Hg. - Systemic veins: The IVC was not visualized.    06/25/20: IMPRESSIONS     1. Left ventricular ejection fraction, by estimation, is 25 to 30%. The  left ventricle has severely decreased function. The left ventricle  demonstrates global hypokinesis. The left ventricular internal cavity size  was moderately dilated. There is  moderate left ventricular hypertrophy. Left ventricular diastolic  parameters are consistent with Grade II diastolic dysfunction  (pseudonormalization).   2. Right ventricular systolic function is moderately reduced. The right  ventricular size is mildly enlarged.   3. Left atrial size was severely dilated.   4. Right atrial size was moderately dilated.   5. The mitral valve is normal in structure. Mild to moderate mitral valve  regurgitation.   6. There is a small perivalvular leak.. The aortic valve has been  repaired/replaced. Aortic valve regurgitation is trivial.    Assessment / Plan: 1. Coronary disease with remote coronary bypass surgery in 1996. Cath in March 2016 showed high grade disease in SVG to OM. s/p successful  PCI with DES.  Native vessels all occluded and he is graft dependent. Repeat cath in January 2018 showed patent grafts. He has chronic nitrate responsive chest pain but symptoms are stable.  He is on Imdur 120 mg daily and Toprol XL. He is doing better on Ranexa but still takes Ntg frequently.  He is  adamant that he will not have another bypass operation. At some point we will need to consider repeat cardiac cath given drop in EF and CHF.  2.  Severe aortic stenosis. S/p TAVR with #23 Edwards Sapien valve. Stable by exam and recent Echo in June  3. Acute on Chronic systolic/diastolic  CHF. Worsening EF by Echo now 25-30%. Class 3 symptoms. Need to ramp up CHF therapy. Will start lasix 20 mg daily. Continue Toprol XL. Stop ramipril. After being off this for 3 days start Entresto 24/26 mg bid. Check BMET in 2 weeks. Will arrange follow up with pharm D for medication titration. Consider adding aldactone later. Also consider SGLT 2 inhibitor but will need to check on cost. I will follow up Dec 19. Will plan to repeat Echo once meds optimized and we'll also consider [employer] at some point  4. Syncope-  Now s/p BiV ICD. Paced.  5. Obesity.   6. Hyperlipidemia. On lipitor.   7. LBBB  8. Paroxysmal Afib - now on Eliquis

## 2020-10-31 ENCOUNTER — Telehealth: Payer: Self-pay

## 2020-10-31 ENCOUNTER — Telehealth: Payer: Self-pay | Admitting: Cardiology

## 2020-10-31 NOTE — Telephone Encounter (Signed)
**Note De-Identified Arlis Yale Obfuscation** The pt called back to discuss the cost of his Crestor.  I advised him that Crestor is not on his med list but that Atorvastatin which is the generic for Lipitor is.  I asked if he was referring to [facility] and he checked his AVS from his office visit with Dr [name] on 10/18 and stated that he was.  I advised him that I did a PA on his Entresto this morning and that it takes 3-7 days for a reply from [place].  I advised him to continue to take his Ramipril and that once his Delene Loll PA has been approved he will need to stop his Ramipril for 36 hours prior to starting Entresto.   He verbalized understanding and thanked me for taking his call.

## 2020-10-31 NOTE — Telephone Encounter (Signed)
Pt is not able to fill his rx for Crestor due to an authorization that is needed.. pt says that pharmacist stated they will contact nurse Malachy Mood to have this authorized.. pt calling in to check on the status of this... please advise

## 2020-10-31 NOTE — Telephone Encounter (Signed)
**Note De-Identified [name] Obfuscation** Entresto PA started through covermymeds. Key: [id number]

## 2020-11-04 ENCOUNTER — Telehealth: Payer: Self-pay

## 2020-11-04 NOTE — Telephone Encounter (Signed)
Spoke to patient he stated he cannot afford Eliquis.Dr.Jordan advised ok to change to Coumadin.Scheduler will call back with PharmD appointment.

## 2020-11-04 NOTE — Telephone Encounter (Signed)
**Note De-Identified Tenaya Hilyer Obfuscation** Letter received [place]ah [place] fax from [facility] stating they have approved the pts Entresto PA until 01/11/2022. I have notified [facility] [id number] - [place], [address] of this approval.

## 2020-11-06 ENCOUNTER — Telehealth: Payer: Self-pay | Admitting: Cardiology

## 2020-11-06 NOTE — Telephone Encounter (Signed)
Pt called questioning if he need to bring lab slip for blood work. Nurse informed pt that orders are in the system but bring lab slip would be helpful.  Pt also state he can't afford Eliquis and was informed someone was going to call to schedule and appointment with pharm D to transition to coumadin.  Will forward to coumadin clinic.

## 2020-11-06 NOTE — Telephone Encounter (Signed)
Patient would like nurse Malachy Mood to give him a call.

## 2020-11-06 NOTE — Telephone Encounter (Signed)
Happy to transition to Coumadin, however has patient assistance been tried?

## 2020-11-06 NOTE — Telephone Encounter (Signed)
I called and spoke to [name] to ask him if he filled out [name] assistance forms for eliquis and he state that he had not so I will place samples for the [name] and assistance forms and they voiced understanding. We would like him to try to stay on the eliquis instead of him switching to coumadin if we can get the assistance approved.

## 2020-11-07 ENCOUNTER — Other Ambulatory Visit: Payer: Self-pay

## 2020-11-07 ENCOUNTER — Ambulatory Visit (INDEPENDENT_AMBULATORY_CARE_PROVIDER_SITE_OTHER): Payer: Medicare HMO

## 2020-11-07 DIAGNOSIS — I480 Paroxysmal atrial fibrillation: Secondary | ICD-10-CM | POA: Diagnosis not present

## 2020-11-07 LAB — CUP PACEART REMOTE DEVICE CHECK
Battery Remaining Longevity: 16 mo
Battery Voltage: 2.89 V
Brady Statistic AP VP Percent: 64.49 %
Brady Statistic AP VS Percent: 0.01 %
Brady Statistic AS VP Percent: 35.42 %
Brady Statistic AS VS Percent: 0.08 %
Brady Statistic RA Percent Paced: 63.95 %
Brady Statistic RV Percent Paced: 99.27 %
Date Time Interrogation Session: 20221027042504
HighPow Impedance: 74 Ohm
Implantable Lead Implant Date: 20161205
Implantable Lead Implant Date: 20161205
Implantable Lead Implant Date: 20161205
Implantable Lead Location: 753858
Implantable Lead Location: 753859
Implantable Lead Location: 753860
Implantable Lead Model: 4598
Implantable Lead Model: 5076
Implantable Lead Model: 6935
Implantable Pulse Generator Implant Date: 20161205
Lead Channel Impedance Value: 304 Ohm
Lead Channel Impedance Value: 304 Ohm
Lead Channel Impedance Value: 304 Ohm
Lead Channel Impedance Value: 342 Ohm
Lead Channel Impedance Value: 361 Ohm
Lead Channel Impedance Value: 361 Ohm
Lead Channel Impedance Value: 418 Ohm
Lead Channel Impedance Value: 456 Ohm
Lead Channel Impedance Value: 532 Ohm
Lead Channel Impedance Value: 532 Ohm
Lead Channel Impedance Value: 589 Ohm
Lead Channel Impedance Value: 589 Ohm
Lead Channel Impedance Value: 589 Ohm
Lead Channel Pacing Threshold Amplitude: 0.625 V
Lead Channel Pacing Threshold Amplitude: 0.625 V
Lead Channel Pacing Threshold Amplitude: 0.875 V
Lead Channel Pacing Threshold Pulse Width: 0.4 ms
Lead Channel Pacing Threshold Pulse Width: 0.4 ms
Lead Channel Pacing Threshold Pulse Width: 0.4 ms
Lead Channel Sensing Intrinsic Amplitude: 0.875 mV
Lead Channel Sensing Intrinsic Amplitude: 0.875 mV
Lead Channel Sensing Intrinsic Amplitude: 10.375 mV
Lead Channel Sensing Intrinsic Amplitude: 10.375 mV
Lead Channel Setting Pacing Amplitude: 2 V
Lead Channel Setting Pacing Amplitude: 2 V
Lead Channel Setting Pacing Amplitude: 2.5 V
Lead Channel Setting Pacing Pulse Width: 0.4 ms
Lead Channel Setting Pacing Pulse Width: 0.4 ms
Lead Channel Setting Sensing Sensitivity: 0.3 mV

## 2020-11-07 MED ORDER — APIXABAN 5 MG PO TABS: 5.0000 mg | ORAL_TABLET | Freq: Two times a day (BID) | ORAL | 3 refills | Status: DC

## 2020-11-07 NOTE — Telephone Encounter (Signed)
Patient assistance forms completed and faxed to [facility] Patient Assistance at fax # [phone].

## 2020-11-12 NOTE — Telephone Encounter (Signed)
Received a fax from [facility] Patient Assistance patient not eligible to receive Eliquis.

## 2020-11-13 ENCOUNTER — Ambulatory Visit: Payer: Medicare HMO | Admitting: Pharmacist

## 2020-11-13 ENCOUNTER — Other Ambulatory Visit: Payer: Self-pay

## 2020-11-13 VITALS — BP 106/68 | HR 63 | Resp 17 | Ht 70.0 in | Wt 214.8 lb

## 2020-11-13 DIAGNOSIS — E785 Hyperlipidemia, unspecified: Secondary | ICD-10-CM | POA: Diagnosis not present

## 2020-11-13 DIAGNOSIS — I350 Nonrheumatic aortic (valve) stenosis: Secondary | ICD-10-CM

## 2020-11-13 DIAGNOSIS — I25119 Atherosclerotic heart disease of native coronary artery with unspecified angina pectoris: Secondary | ICD-10-CM | POA: Diagnosis not present

## 2020-11-13 DIAGNOSIS — I480 Paroxysmal atrial fibrillation: Secondary | ICD-10-CM | POA: Diagnosis not present

## 2020-11-13 DIAGNOSIS — E7800 Pure hypercholesterolemia, unspecified: Secondary | ICD-10-CM | POA: Diagnosis not present

## 2020-11-13 DIAGNOSIS — Z952 Presence of prosthetic heart valve: Secondary | ICD-10-CM

## 2020-11-13 DIAGNOSIS — I5043 Acute on chronic combined systolic (congestive) and diastolic (congestive) heart failure: Secondary | ICD-10-CM | POA: Diagnosis not present

## 2020-11-13 DIAGNOSIS — I25708 Atherosclerosis of coronary artery bypass graft(s), unspecified, with other forms of angina pectoris: Secondary | ICD-10-CM

## 2020-11-13 DIAGNOSIS — I5022 Chronic systolic (congestive) heart failure: Secondary | ICD-10-CM

## 2020-11-13 DIAGNOSIS — I447 Left bundle-branch block, unspecified: Secondary | ICD-10-CM | POA: Diagnosis not present

## 2020-11-13 MED ORDER — ENTRESTO 24-26 MG PO TABS: 1.0000 | ORAL_TABLET | Freq: Two times a day (BID) | ORAL | 1 refills | Status: DC

## 2020-11-13 MED ORDER — EMPAGLIFLOZIN 10 MG PO TABS: 10.0000 mg | ORAL_TABLET | Freq: Every day | ORAL | 1 refills | Status: DC

## 2020-11-13 MED ORDER — FUROSEMIDE 20 MG PO TABS: 20.0000 mg | ORAL_TABLET | Freq: Every day | ORAL | 3 refills | Status: DC

## 2020-11-13 MED ORDER — ATORVASTATIN CALCIUM 80 MG PO TABS: 80.0000 mg | ORAL_TABLET | Freq: Every day | ORAL | 3 refills | Status: DC

## 2020-11-13 MED ORDER — EZETIMIBE 10 MG PO TABS: 10.0000 mg | ORAL_TABLET | Freq: Every day | ORAL | 3 refills | Status: DC

## 2020-11-13 MED ORDER — PANTOPRAZOLE SODIUM 40 MG PO TBEC: 40.0000 mg | DELAYED_RELEASE_TABLET | Freq: Every day | ORAL | 1 refills | Status: DC

## 2020-11-13 MED ORDER — ISOSORBIDE MONONITRATE ER 120 MG PO TB24: 120.0000 mg | ORAL_TABLET | Freq: Every day | ORAL | 3 refills | Status: DC

## 2020-11-13 MED ORDER — RANOLAZINE ER 500 MG PO TB12: 500.0000 mg | ORAL_TABLET | Freq: Two times a day (BID) | ORAL | 3 refills | Status: DC

## 2020-11-13 MED ORDER — APIXABAN 5 MG PO TABS: 5.0000 mg | ORAL_TABLET | Freq: Two times a day (BID) | ORAL | 1 refills | Status: DC

## 2020-11-13 NOTE — Progress Notes (Signed)
Patient ID: [name]                 DOB: [birth year]                      MRN: [id number]     HPI: [name] is a 75 y.o. male referred by Dr. [name] to pharmacy clinic for HF medication management. PMH is significant for LBBB, angina, CHF, CAD, HLD, and HTN. Most recent LVEF 25-30% on 06/25/20.  Today he returns to pharmacy clinic for further medication titration. At last visit with Dr [name] patient complained of SOB and LEE and was taking 200 NTG a month. Patient was switched to [facility] and started on daily Lasix.  Symptomatically, he reports one episode of dizziness and lightheadedness.  Reports fatigue but he believes this is because he does not sleep well. Continues to admit to chest pain. Feels SOB when working.   He has been checking his weight at home.  10/19: 214# 10/22: 210# 10/26: 210# 10/28: 207# 10/31: 209# Has not been checking blood pressure at home.  Does not have a blood pressure cuff.  Appetite has been "good".  However he does eat candy and fried foods.  Does not add salt to food to admits to snacks such as chips.  Current CHF meds:   Furosemide 20mg  daily Entresto 24-26mg  BID Toprol 50mg  daily Isosorbide 120mg  daily  BP goal: <130/80   Wt Readings from Last 3 Encounters:  11/13/20 214 lb 12.8 oz (97.4 kg)  10/29/20 222 lb (100.7 kg)  06/04/20 219 lb 12.8 oz (99.7 kg)   BP Readings from Last 3 Encounters:  11/13/20 106/68  10/29/20 140/86  06/04/20 108/60   Pulse Readings from Last 3 Encounters:  11/13/20 63  10/29/20 72  06/04/20 64    Renal function: CrCl cannot be calculated (Patient's most recent lab result is older than the maximum 21 days allowed.).  Past Medical History:  Diagnosis Date   Aortic stenosis    Barrett's esophageal ulceration    Chronic systolic CHF (congestive heart failure) (HCC)    Coronary artery disease    a. CABG 1996: LIMA graft to the LAD and diagonal, sequential vein graft to the acute marginal, PDA,  and posterior lateral branches of the right coronary, sequential saphenous vein graft to the obtuse marginal vessel and distal circumflex. b. Cath 2007 patent grafts. c. s/p DES to SVG-OM2 in 03/2014.   Coronary artery disease involving autologous vein coronary bypass graft with angina pectoris ([place]) 03/13/2014   PCI using DES in SVG to LCx system   Dental caries    pre-heart valve surgery protocol   Gastroesophageal reflux disease    History of erectile dysfunction    History of hiatal hernia    History of kidney stones    History of obesity    History of prostatitis    Hypercholesterolemia    Hypertension    Kidney stones    LBBB (left bundle branch block)    Nodule of kidney 02/04/2016   Incidental 67mm nodule upper pole right kidney noted on CT angiogram - MRI with and without gadolinium contrast recommened in 6 months   OSA (obstructive sleep apnea)    "suppose to wear mask; I don't" (03/13/2014)   PVC's (premature ventricular contractions)    S/P CABG x 7 01/07/1995   LIMA to LAD-diagonal, SVG to OM-LCx, SVG to AM-PD-RPL   S/P TAVR (transcatheter aortic valve replacement) 02/18/2016  26 mm Edwards Sapien 3 transcatheter heart valve placed via percutaneous right transfemoral approach    Current Outpatient Medications on File Prior to Visit  Medication Sig Dispense Refill   amoxicillin (AMOXIL) 500 MG tablet Take 2g (four 500 mg tablets) one hour prior to dental procedures. 4 tablet 3   apixaban (ELIQUIS) 5 MG TABS tablet Take 1 tablet (5 mg total) by mouth 2 (two) times daily. 180 tablet 3   Ascorbic Acid (VITAMIN C) 1000 MG tablet Take 1,000 mg by mouth daily.     atorvastatin (LIPITOR) 80 MG tablet TAKE ONE TABLET BY MOUTH DAILY 90 tablet 3   Cholecalciferol (VITAMIN D-3) 5000 units TABS Take 5,000 Units by mouth daily.      ezetimibe (ZETIA) 10 MG tablet Take 1 tablet (10 mg total) by mouth daily. 90 tablet 3   finasteride (PROSCAR) 5 MG tablet Take 5 mg by mouth daily.      furosemide (LASIX) 20 MG tablet Take 1 tablet (20 mg total) by mouth daily. 90 tablet 3   isosorbide mononitrate (IMDUR) 120 MG 24 hr tablet TAKE ONE TABLET BY MOUTH DAILY 90 tablet 2   metoprolol succinate (TOPROL-XL) 50 MG 24 hr tablet TAKE ONE TABLET BY MOUTH DAILY WITH OR IMMEDIATELY FOLLOWING A MEAL 90 tablet 3   nitroGLYCERIN (NITROSTAT) 0.4 MG SL tablet DISSOLVE 1 TABLET UNDER TONGUE FOR CHEST PAIN - IF PAIN REMAINS AFTER 5 MIN, CALL 911 AND REPEAT DOSE. MAX 3 TABLETS IN 15 MINUTES FOR 30 DAY SUPPLY 200 tablet 6   oxybutynin (DITROPAN) 5 MG tablet Take 5 mg by mouth daily.      pantoprazole (PROTONIX) 40 MG tablet TAKE ONE TABLET BY MOUTH DAILY 90 tablet 1   ranolazine (RANEXA) 500 MG 12 hr tablet Take 1 tablet (500 mg total) by mouth 2 (two) times daily. 180 tablet 3   sacubitril-valsartan (ENTRESTO) 24-26 MG Take 1 tablet by mouth 2 (two) times daily. 60 tablet 3   valACYclovir (VALTREX) 500 MG tablet TAKE ONE TABLET BY MOUTH DAILY AS NEEDED FOR OUTBREAK 30 tablet 0   No current facility-administered medications on file prior to visit.    No Active Allergies   Assessment/Plan:  1. CHF -  Patient BP in room today 106/68 which is at goal of <130/80. Concern blood pressure may have been low since patient reported dizziness however this only occurred once.  Patient has not been checking BP at home since he did not have a cuff.  Gave examples of recommended [employer] BP cuffs and wrote them for him on AVS.  Discussed basics of GDMT and role of medication classes in CHF.  Due to pulse and BP, do not want to titrate metoprolol or Entresto at this time and hesistant to start spironolactone.  Will start Jardiance 10mg  at this time.  Patient due for BMP.    Since patient not reporting edema or SOB, advised he could begin using furosemide prn for weight gain >3# overnight or 5# in a week.  Continue: Furosemide 20mg  daily Entresto 24-26mg  BID Toprol 50mg  daily Isosorbide 120mg   daily  Start: Jardiance 10mg  daily  Karren Cobble, PharmD, [place], [address], [place], [place], [place], [address] Phone: [phone], Fax: [phone]

## 2020-11-13 NOTE — Patient Instructions (Addendum)
It was nice meeting you today  We would like your blood pressure to stay less than 130/80  The blood pressure cuffs we recommend are made by [employer].  We recommend the Series 3 or greater or the Bronze series or greater  Continue your: Entresto 24-26mg  twice a day Metoprolol 50mg  once a day  You can stop taking your furosemide every day now.  Begin taking it if you gain more than 3# overnight or 5# in a week  We will start a new medication called Jardiance 10mg .  You can take 1 tablet in the morning  Continue to weight yourself daily  We will call you with your lab results  Call the number for patient assistance to see why you were denied.  Please call with any questions!  Karren Cobble, PharmD, BCACP, [place], [place], [place], [place], [address] Phone: [phone], Fax: [phone]

## 2020-11-14 LAB — BASIC METABOLIC PANEL
BUN/Creatinine Ratio: 19 (ref 10–24)
BUN: 26 mg/dL (ref 8–27)
CO2: 24 mmol/L (ref 20–29)
Calcium: 9.4 mg/dL (ref 8.6–10.2)
Chloride: 100 mmol/L (ref 96–106)
Creatinine, Ser: 1.34 mg/dL — ABNORMAL HIGH (ref 0.76–1.27)
Glucose: 167 mg/dL — ABNORMAL HIGH (ref 70–99)
Potassium: 4.2 mmol/L (ref 3.5–5.2)
Sodium: 138 mmol/L (ref 134–144)
eGFR: 55 mL/min/{1.73_m2} — ABNORMAL LOW (ref >59)

## 2020-11-15 NOTE — Progress Notes (Signed)
Remote ICD transmission.   

## 2020-11-18 ENCOUNTER — Encounter: Payer: Self-pay | Admitting: Cardiology

## 2020-11-18 NOTE — Telephone Encounter (Signed)
Patient calling back about the patient assistance for the eliquis. He says there are some major problems with it.

## 2020-11-18 NOTE — Telephone Encounter (Signed)
error 

## 2020-11-19 ENCOUNTER — Telehealth: Payer: Self-pay | Admitting: Cardiology

## 2020-11-19 NOTE — Telephone Encounter (Signed)
Returned call to pt. He state would prefer to only speak with Malachy Mood as she is aware of medication concerns.   Will forward to nurse.

## 2020-11-19 NOTE — Telephone Encounter (Signed)
Called patient left message on personal voice mail to call back. 

## 2020-11-19 NOTE — Telephone Encounter (Signed)
   Pt is requesting to speak with Malachy Mood, he said it about his meds and its a little urgent

## 2020-11-19 NOTE — Telephone Encounter (Signed)
Spoke to patient he stated he cannot afford Jardiance and Eliquis.He was not approved for patient assistance.Stated he saw our pharmacist and he did not recommend taking coumadin.Advised Dr.Jordan is out of office.I will speak to him when he is back in office and call you back.

## 2020-11-19 NOTE — Telephone Encounter (Signed)
Patient's wife is returning call. 

## 2020-11-19 NOTE — Telephone Encounter (Signed)
See previous 11/8 telphone note.

## 2020-12-02 ENCOUNTER — Other Ambulatory Visit: Payer: Self-pay | Admitting: Cardiology

## 2020-12-02 ENCOUNTER — Telehealth: Payer: Self-pay | Admitting: Cardiology

## 2020-12-02 DIAGNOSIS — I350 Nonrheumatic aortic (valve) stenosis: Secondary | ICD-10-CM

## 2020-12-02 DIAGNOSIS — Z952 Presence of prosthetic heart valve: Secondary | ICD-10-CM

## 2020-12-02 NOTE — Telephone Encounter (Signed)
Patient is calling to talk with Dr. [name] or nurse in regards to his medication

## 2020-12-02 NOTE — Telephone Encounter (Signed)
Spoke with pt, he reports having trouble affording eliquis and entresto. He reports he had talked with cheryl about talking about alternatives for those drugs. He is calling to follow up on that. Aware will forward to cheryl, aware dr [name] is not in the office today.

## 2020-12-03 NOTE — Telephone Encounter (Signed)
Spoke to patient Dr.Jordan advised ok to stop Jardiance.He advised to stop Eliquis and start Coumadin.Scheduler will call back with PharmD appointment to change to Coumadin.

## 2020-12-04 ENCOUNTER — Ambulatory Visit: Payer: Medicare HMO

## 2020-12-04 MED ORDER — APIXABAN 5 MG PO TABS: 5.0000 mg | ORAL_TABLET | Freq: Two times a day (BID) | ORAL | 0 refills | Status: DC

## 2020-12-04 NOTE — Addendum Note (Signed)
Addended by: Rollen Sox on: 12/04/2020 09:59 AM   Modules accepted: Orders

## 2020-12-04 NOTE — Telephone Encounter (Signed)
Saw patient appeared on PharmD schedule today to convert from Eliquis to coumadin. Called patient and advised we have enough samples to get him through until 2023 when his copay goes back down.  Patient will pick up samples and was grateful for assistance because he said he preferred to be on Eliquis.

## 2020-12-05 ENCOUNTER — Other Ambulatory Visit: Payer: Self-pay | Admitting: Cardiology

## 2020-12-05 DIAGNOSIS — I25708 Atherosclerosis of coronary artery bypass graft(s), unspecified, with other forms of angina pectoris: Secondary | ICD-10-CM

## 2020-12-10 NOTE — Telephone Encounter (Signed)
Spoke to patient Dr.Jordan advised ok to stop Jardiance.Patient already spoke to our pharmacist and was given enough Eliquis samples until he gets out of [place].

## 2020-12-17 ENCOUNTER — Telehealth: Payer: Self-pay | Admitting: Cardiology

## 2020-12-17 MED ORDER — NITROGLYCERIN 0.4 MG SL SUBL: SUBLINGUAL_TABLET | SUBLINGUAL | 6 refills | Status: DC

## 2020-12-17 NOTE — Telephone Encounter (Signed)
*  STAT* If patient is at the pharmacy, call can be transferred to refill team.   1. Which medications need to be refilled? (please list name of each medication and dose if known)  nitroGLYCERIN (NITROSTAT) 0.4 MG SL tablet  2. Which pharmacy/location (including street and city if local pharmacy) is medication to be sent to? [facility] [phone] - [place], [address]  3. Do they need a 30 day or 90 day supply? 30 day supply

## 2020-12-17 NOTE — Telephone Encounter (Signed)
Refills has been sent to pharmacy. 

## 2020-12-23 ENCOUNTER — Telehealth: Payer: Self-pay | Admitting: Cardiology

## 2020-12-23 NOTE — Telephone Encounter (Signed)
   Pt c/o of Chest Pain: STAT if CP now or developed within 24 hours  1. Are you having CP right now? No  2. Are you experiencing any other symptoms (ex. SOB, nausea, vomiting, sweating)? SOB   3. How long have you been experiencing CP? Few days   4. Is your CP continuous or coming and going? Anytime activity   5. Have you taken Nitroglycerin? Yes  ?   Pt said his CP is more frequent than it was, for the last few days he had taken total of 12 nitroglycerin, he also have SOB and loss of appetite   Call came in after 5 pm

## 2020-12-24 ENCOUNTER — Other Ambulatory Visit: Payer: Self-pay

## 2020-12-24 ENCOUNTER — Encounter: Payer: Self-pay | Admitting: Student

## 2020-12-24 ENCOUNTER — Ambulatory Visit: Payer: Medicare HMO | Admitting: Physician Assistant

## 2020-12-24 VITALS — BP 152/76 | HR 79 | Ht 70.0 in | Wt 215.0 lb

## 2020-12-24 DIAGNOSIS — I10 Essential (primary) hypertension: Secondary | ICD-10-CM | POA: Diagnosis not present

## 2020-12-24 DIAGNOSIS — I25729 Atherosclerosis of autologous artery coronary artery bypass graft(s) with unspecified angina pectoris: Secondary | ICD-10-CM | POA: Diagnosis not present

## 2020-12-24 DIAGNOSIS — I5043 Acute on chronic combined systolic (congestive) and diastolic (congestive) heart failure: Secondary | ICD-10-CM

## 2020-12-24 DIAGNOSIS — I447 Left bundle-branch block, unspecified: Secondary | ICD-10-CM

## 2020-12-24 DIAGNOSIS — E785 Hyperlipidemia, unspecified: Secondary | ICD-10-CM

## 2020-12-24 DIAGNOSIS — K219 Gastro-esophageal reflux disease without esophagitis: Secondary | ICD-10-CM

## 2020-12-24 DIAGNOSIS — I350 Nonrheumatic aortic (valve) stenosis: Secondary | ICD-10-CM

## 2020-12-24 DIAGNOSIS — I25110 Atherosclerotic heart disease of native coronary artery with unstable angina pectoris: Secondary | ICD-10-CM

## 2020-12-24 DIAGNOSIS — I493 Ventricular premature depolarization: Secondary | ICD-10-CM

## 2020-12-24 NOTE — Progress Notes (Addendum)
Office Visit    Patient Name: [name] Date of Encounter: 12/24/2020  PCP:  Preston Merl, MD   [place]  Cardiologist:  Preston [name], MD  Advanced Practice Provider:  No care team member to display Electrophysiologist:  None    Chief Complaint    [name] is a 75 y.o. male with a hx of  LBBB, angina, CAD s/p CABG and previous PCI, CHF, HLD, and HTN presents today for evaluation of chest pain.   Past Medical History    Past Medical History:  Diagnosis Date   Aortic stenosis    Barrett's esophageal ulceration    Chronic systolic CHF (congestive heart failure) (HCC)    Coronary artery disease    a. CABG 1996: LIMA graft to the LAD and diagonal, sequential vein graft to the acute marginal, PDA, and posterior lateral branches of the right coronary, sequential saphenous vein graft to the obtuse marginal vessel and distal circumflex. b. Cath 2007 patent grafts. c. s/p DES to SVG-OM2 in 03/2014.   Coronary artery disease involving autologous vein coronary bypass graft with angina pectoris ([place]) 03/13/2014   PCI using DES in SVG to LCx system   Dental caries    pre-heart valve surgery protocol   Gastroesophageal reflux disease    History of erectile dysfunction    History of hiatal hernia    History of kidney stones    History of obesity    History of prostatitis    Hypercholesterolemia    Hypertension    Kidney stones    LBBB (left bundle branch block)    Nodule of kidney 02/04/2016   Incidental 71mm nodule upper pole right kidney noted on CT angiogram - MRI with and without gadolinium contrast recommened in 6 months   OSA (obstructive sleep apnea)    "suppose to wear mask; I don't" (03/13/2014)   PVC's (premature ventricular contractions)    S/P CABG x 7 01/07/1995   LIMA to LAD-diagonal, SVG to OM-LCx, SVG to AM-PD-RPL   S/P TAVR (transcatheter aortic valve replacement) 02/18/2016   26 mm Edwards Sapien 3 transcatheter heart valve  placed via percutaneous right transfemoral approach   Past Surgical History:  Procedure Laterality Date   [place]; ~ 2006   ; Ejection fraction is estimated at 45%   CARDIAC CATHETERIZATION N/A 01/22/2016   Procedure: Right/Left Heart Cath and Coronary/Graft Angiography;  Surgeon: Preston M [name], MD;  Location: [facility];  Service: Cardiovascular;  Laterality: N/A;   CHOLECYSTECTOMY N/A 01/23/2014   Procedure: LAPAROSCOPIC CHOLECYSTECTOMY WITH INTRAOPERATIVE CHOLANGIOGRAM ;  Surgeon: Preston Skates, MD;  Location: [facility];  Service: General;  Laterality: N/A;   COLONOSCOPY W/ POLYPECTOMY     CORONARY ANGIOPLASTY WITH STENT PLACEMENT  03/13/2014   "1"   CORONARY ARTERY BYPASS GRAFT  1996   "CABG X 7"   CYSTOSCOPY WITH STENT PLACEMENT     EP IMPLANTABLE DEVICE N/A 12/17/2014   Procedure: BiV ICD Insertion CRT-D;  Surgeon: Preston Lance, MD;  Location: [facility];  Service: Cardiovascular;  Laterality: N/A;   LEFT HEART CATHETERIZATION WITH CORONARY/GRAFT ANGIOGRAM N/A 03/13/2014   Procedure: LEFT HEART CATHETERIZATION WITH Beatrix Fetters;  Surgeon: Preston M [name], MD;  Location: [facility];  Service: Cardiovascular;  Laterality: N/A;   MULTIPLE EXTRACTIONS WITH ALVEOLOPLASTY N/A 02/07/2016   Procedure: Extraction of tooth #'s 1,2,3,14,15, 18, 20, 29,30,31 with alveoloplasty and dental cleaning of teeth.;  Surgeon: Preston Moll  [name], DDS;  Location: [facility];  Service: Oral Surgery;  Laterality: N/A;   TEE WITHOUT CARDIOVERSION N/A 02/18/2016   Procedure: TRANSESOPHAGEAL ECHOCARDIOGRAM (TEE);  Surgeon: Preston Mocha, MD;  Location: [facility];  Service: Open Heart Surgery;  Laterality: N/A;   TONSILLECTOMY  ~ 1950   TRANSCATHETER AORTIC VALVE REPLACEMENT, TRANSFEMORAL N/A 02/18/2016   Procedure: TRANSCATHETER AORTIC VALVE REPLACEMENT, TRANSFEMORAL;  Surgeon: Preston Mocha, MD;  Location: [facility];  Service: Open Heart Surgery;  Laterality: N/A;    Allergies  No  Active Allergies  History of Present Illness    [name] is a 75 y.o. male with a hx of hx of  LBBB, angina, CHF, CAD s/p CABG and previous PCI, HLD, and HTN presents today for evaluation of chest pain.  last seen 11/13/2020 by [name], [place].  He has a known history of coronary disease and is status post CABG in 1996. This included an LIMA graft to the LAD and diagonal, sequential vein graft to the acute marginal, PDA, and posterior lateral branches of the right coronary, sequential saphenous vein graft to the obtuse marginal vessel and distal circumflex. He had a cardiac catheterization in 2007 which showed that his grafts were all patent. On 04/02/14 he had repeat cath for chest pain and grafts were again patent but there was a 90% stenosis in the SVG to OM2. This was stented with a DES.  Despite this intervention he has continued to have chest pain. He had cholecystectomy in January 8341 without complications and without relief of his pain. CXR was unremarkable. CT of the chest was negative for PE or aortic pathology. He had EGD by [name] that showed Barrett's esophagus but no evidence of esophagitis. He is on Protonix.    In Dec 2016  he was admitted for evaluation of syncope. Echo showed EF 30-35% with moderate to severe AS. Mean gradient 24 mm Hg. AVA 0.8-0.9. There was concern that AVA was underestimated due to low output. EEG was negative. He had a LBBB. He underwent placement of a BIV-ICD by Dr. Lovena Barnes.  He then  had a repeat Echo showing some increase in AV gradient. He underwent a Dobutamine Echo. This showed improvement in EF to 40-45%. There was also increase in AV gradient with a mean gradient of 41 mm Hg and severe AS. He underwent uncomplicated TAVR 96/22/2979 via a percutaneous right transfemoral approach. He was treated with a 26 mm Edwards Sapien 3 transcatheter heart valve. Repeat Echo in March 2018 showed improvement in EF to 40-45%. Echo in February 2019 was unchanged.     Despite all these interventions he has continued to have chest pain. He previously described his pain as right parasternal radiating to his jaw. It is relieved with sl Ntg. Typically occurs at night. He was able to do heavy yard work such as lifting, hauling, and blowing leaves without change. He does have some SOB. We did switch to him taking Imdur at bedtime and this seemed to help.   Pacemaker check in January was stable.    On pacer check in April he was noted to have AFib lasting 6.5 hours. He was asymptomatic. Was seen in Afib clinic and started on Eliquis. Echo was updated and showed significant worsening of LV function with EF 25-30%. Mild to moderate MR. TAVR functioning well. Severe LAE and moderate RAE. Seen today with complaints of SOB. His pacer check in July showed no recurrent Afib.   He was seen for further  medication titration about a month ago but prior to that he was  seen by Dr. [name] back in 10/2020 and at that time the patient complained of SOB and LEE and was taking 200 NGT a month. He reported more SOB and was having a hard time doing any yard work without stopping. He does get short of breath at night and has been sleeping sitting up. He notes Barnes edema but has attributed this to his neuropathy. He continued to have chest pain. States that this pattern has not changed.  Today, he presents for an evaluation of his chest pain which has become worse over the last 1 to 2 months.  It has become more severe and more frequent mostly with activity.  It has progressed in severity and now occurs when he is just walking to the mailbox and back. He has had shortness of breath with activity and at rest.  He has lost his appetite because every time he eats he also gets chest pain.  He has never been able to determine if the chest pain is from indigestion or cardiac related. He has been taking his Eliquis but experiencing nosebleeds.  He has 1 about every 3 to 4 weeks.  He usually has to pack  his nostrils and they stop pretty quickly.  He has noticed more swelling in his bilateral lower extremities but attributes this to lack of activity due to his chest pain.  He also endorses neuropathy and sometimes cannot tell the difference between neuropathy and swelling.  His weight has been stable.  He does not experience any palpitations or fluttering in his chest.  When he has associated shortness of breath he does notice that his heart rate accelerates.  He can lay flat but does not lay flat because he does not sleep much at night.  He usually is up at night and takes naps during the day.  He has never woken up from a nap short of breath.  His blood pressure is little bit elevated today 152/76 and he does not usually take it at home.  He is compliant with all his medications and I have encouraged him to purchase a blood pressure cuff and take his blood pressures for 2 to 3 weeks and send them to me via MyChart.  He is also encouraged to take daily weights and record those as well.  We have decided to set him up with a cardiac catheterization today for his worsening chest pain.  We discussed this with Dr. [name] who has availability on Thursday.  We will obtain labs today including a CBC and a BMP.  No orthopnea, PND. Reports no palpitations.     EKGs/Labs/Other Studies Reviewed:   The following studies were reviewed today:  ECHO 06/25/2020  Impression  1. Left ventricular ejection fraction, by estimation, is 25 to 30%. The left ventricle has severely decreased function. The left ventricle demonstrates global hypokinesis. The left ventricular internal cavity size was moderately dilated. There is moderate left ventricular hypertrophy. Left ventricular diastolic parameters are consistent with Grade II diastolic dysfunction (pseudonormalization). 2. Right ventricular systolic function is moderately reduced. The right ventricular size is mildly enlarged. 3. Left atrial size was severely  dilated. 4. Right atrial size was moderately dilated. 5. The mitral valve is normal in structure. Mild to moderate mitral valve regurgitation. 6. There is a small perivalvular leak.. The aortic valve has been repaired/replaced. Aortic valve regurgitation is trivial.  EKG:  EKG is  ordered today.  The  ekg ordered today demonstrates biventricular pacemaker detected, consistent with previous EKGs.   Recent Labs: 04/10/2020: Hemoglobin 14.1; Platelets 189 07/11/2020: ALT 21 11/13/2020: BUN 26; Creatinine, Ser 1.34; Potassium 4.2; Sodium 138  Recent Lipid Panel    Component Value Date/Time   CHOL 120 07/11/2020 1015   TRIG 65 07/11/2020 1015   HDL 41 07/11/2020 1015   CHOLHDL 2.9 07/11/2020 1015   CHOLHDL 3.9 03/12/2015 0932   VLDL 21 03/12/2015 0932   LDLCALC 65 07/11/2020 1015     Home Medications   Current Meds  Medication Sig   amoxicillin (AMOXIL) 500 MG tablet Take 2g (four 500 mg tablets) one hour prior to dental procedures.   apixaban (ELIQUIS) 5 MG TABS tablet Take 1 tablet (5 mg total) by mouth 2 (two) times daily.   Ascorbic Acid (VITAMIN C) 1000 MG tablet Take 1,000 mg by mouth daily.   atorvastatin (LIPITOR) 80 MG tablet Take 1 tablet (80 mg total) by mouth daily.   Cholecalciferol (VITAMIN D-3) 5000 units TABS Take 5,000 Units by mouth daily.    finasteride (PROSCAR) 5 MG tablet Take 5 mg by mouth daily.   furosemide (LASIX) 20 MG tablet Take 1 tablet (20 mg total) by mouth daily.   isosorbide mononitrate (IMDUR) 120 MG 24 hr tablet Take 1 tablet (120 mg total) by mouth daily.   metoprolol succinate (TOPROL-XL) 50 MG 24 hr tablet TAKE ONE TABLET BY MOUTH DAILY WITH OR IMMEDIATELY FOLLOWING A MEAL   nitroGLYCERIN (NITROSTAT) 0.4 MG SL tablet DISSOLVE 1 TABLET UNDER TONGUE FOR CHEST PAIN - IF PAIN REMAINS AFTER 5 MIN, CALL 911 AND REPEAT DOSE. MAX 3 TABLETS IN 15 MINUTES FOR 30 DAY SUPPLY   oxybutynin (DITROPAN) 5 MG tablet Take 5 mg by mouth daily.    pantoprazole  (PROTONIX) 40 MG tablet Take 1 tablet (40 mg total) by mouth daily.   ranolazine (RANEXA) 500 MG 12 hr tablet Take 1 tablet (500 mg total) by mouth 2 (two) times daily.   sacubitril-valsartan (ENTRESTO) 24-26 MG Take 1 tablet by mouth 2 (two) times daily.   valACYclovir (VALTREX) 500 MG tablet TAKE ONE TABLET BY MOUTH DAILY AS NEEDED FOR OUTBREAK     Review of Systems      All other systems reviewed and are otherwise negative except as noted above.  Physical Exam    VS:  BP (!) 152/76    Pulse 79    Ht 5\' 10"  (1.778 m)    Wt 215 lb (97.5 kg)    SpO2 97%    BMI 30.85 kg/m  , BMI Body mass index is 30.85 kg/m.  Wt Readings from Last 3 Encounters:  12/24/20 215 lb (97.5 kg)  11/13/20 214 lb 12.8 oz (97.4 kg)  10/29/20 222 lb (100.7 kg)     GEN: Well nourished, well developed, in no acute distress. HEENT: normal. Neck: Supple, no JVD, carotid bruits, or masses. Cardiac: RRR, no murmurs, rubs, or gallops. No clubbing, cyanosis, 1-2+ pitting edema.  Radials/PT 2+ and equal bilaterally.  Respiratory:  Respirations regular and unlabored, clear to auscultation bilaterally. GI: Soft, nontender, nondistended. MS: No deformity or atrophy. Skin: Warm and dry, no rash. Neuro:  Strength and sensation are intact. Psych: Normal affect.  Assessment & Plan    Unstable angina/history of CAD with previous stenting and CABG -Over the past 1 to 2 months he has experienced more frequent more severe unstable angina.  He also have associated shortness of breath with activity and at  rest. -He reports taking as much is 200 tablets of sublingual nitroglycerin over the last month.  In addition he is on Imdur and Ranexa -After discussing the patient with Dr. [name], we have decided to schedule a cardiac catheterization with possible PCI -We have ordered a CBC and BMP today for preop labs -He will hold his Eliquis today for the next 36 hours preprocedure.  We have scheduled his cardiac catheterization on  Thursday with Dr. [name] -He has been experiencing nosebleeds every 3 to 4 weeks on his Eliquis.  We will review his CBC once completed.  Hypertension -Elevated today in the clinic.  BP 152/76 -He does not have a blood pressure cuff at home but is willing to purchase one and keep track of his blood pressures for the next 2 to 3 weeks and send me a message via MyChart -Continue current medication regimen of Entresto and Toprol XL  Hyperlipidemia -Recent lipid panel was done 06/2020 which showed HDL 41, LDL 65, total cholesterol 120, triglycerides 65 -Continue Zetia and Lipitor -He will need a follow-up lipid panel in the next 6 months.  Well-controlled at this time  CHF -Appears volume overloaded on exam today with 1-2+ pitting edema bilaterally in his lower extremities.  He states this is associated with lack of activity due to his chest pain -The patient states that his weight has been stable -Encouraged a low-sodium diet -He is on daily Lasix 20 mg -Encourage daily weights and to record these weights and send them to me via MyChart -If LVEDP elevated on cath then may need to increase Lasix for better fluid optimization   GERD -Continue Protonix and as needed antacids -He states that sometimes he also takes a nitroglycerin when having acid reflux symptoms  Disposition: Follow up in 2 week(s) with Preston [name], MD or APP.  Signed, Elgie Collard, PA-C 12/24/2020, 3:51 PM [facility]

## 2020-12-24 NOTE — H&P (View-Only) (Signed)
Office Visit    Patient Name: [name] Date of Encounter: 12/24/2020  PCP:  Janeann Merl, MD   [place]  Cardiologist:  Peter [name], MD  Advanced Practice Provider:  No care team member to display Electrophysiologist:  None    Chief Complaint    [name] is a 75 y.o. male with a hx of  LBBB, angina, CAD s/p CABG and previous PCI, CHF, HLD, and HTN presents today for evaluation of chest pain.   Past Medical History    Past Medical History:  Diagnosis Date   Aortic stenosis    Barrett's esophageal ulceration    Chronic systolic CHF (congestive heart failure) (HCC)    Coronary artery disease    a. CABG 1996: LIMA graft to the LAD and diagonal, sequential vein graft to the acute marginal, PDA, and posterior lateral branches of the right coronary, sequential saphenous vein graft to the obtuse marginal vessel and distal circumflex. b. Cath 2007 patent grafts. c. s/p DES to SVG-OM2 in 03/2014.   Coronary artery disease involving autologous vein coronary bypass graft with angina pectoris ([place]) 03/13/2014   PCI using DES in SVG to LCx system   Dental caries    pre-heart valve surgery protocol   Gastroesophageal reflux disease    History of erectile dysfunction    History of hiatal hernia    History of kidney stones    History of obesity    History of prostatitis    Hypercholesterolemia    Hypertension    Kidney stones    LBBB (left bundle branch block)    Nodule of kidney 02/04/2016   Incidental 49mm nodule upper pole right kidney noted on CT angiogram - MRI with and without gadolinium contrast recommened in 6 months   OSA (obstructive sleep apnea)    "suppose to wear mask; I don't" (03/13/2014)   PVC's (premature ventricular contractions)    S/P CABG x 7 01/07/1995   LIMA to LAD-diagonal, SVG to OM-LCx, SVG to AM-PD-RPL   S/P TAVR (transcatheter aortic valve replacement) 02/18/2016   26 mm Edwards Sapien 3 transcatheter heart valve  placed via percutaneous right transfemoral approach   Past Surgical History:  Procedure Laterality Date   [place]; ~ 2006   ; Ejection fraction is estimated at 45%   CARDIAC CATHETERIZATION N/A 01/22/2016   Procedure: Right/Left Heart Cath and Coronary/Graft Angiography;  Surgeon: [name] [name], MD;  Location: [facility];  Service: Cardiovascular;  Laterality: N/A;   CHOLECYSTECTOMY N/A 01/23/2014   Procedure: LAPAROSCOPIC CHOLECYSTECTOMY WITH INTRAOPERATIVE CHOLANGIOGRAM ;  Surgeon: Fanny Skates, MD;  Location: [facility];  Service: General;  Laterality: N/A;   COLONOSCOPY W/ POLYPECTOMY     CORONARY ANGIOPLASTY WITH STENT PLACEMENT  03/13/2014   "1"   CORONARY ARTERY BYPASS GRAFT  1996   "CABG X 7"   CYSTOSCOPY WITH STENT PLACEMENT     EP IMPLANTABLE DEVICE N/A 12/17/2014   Procedure: BiV ICD Insertion CRT-D;  Surgeon: Evans Lance, MD;  Location: [facility];  Service: Cardiovascular;  Laterality: N/A;   LEFT HEART CATHETERIZATION WITH CORONARY/GRAFT ANGIOGRAM N/A 03/13/2014   Procedure: LEFT HEART CATHETERIZATION WITH Beatrix Fetters;  Surgeon: [name] [name], MD;  Location: [facility];  Service: Cardiovascular;  Laterality: N/A;   MULTIPLE EXTRACTIONS WITH ALVEOLOPLASTY N/A 02/07/2016   Procedure: Extraction of tooth #'s 1,2,3,14,15, 18, 20, 29,30,31 with alveoloplasty and dental cleaning of teeth.;  Surgeon: Jori Moll  Carlos Levering, DDS;  Location: [facility];  Service: Oral Surgery;  Laterality: N/A;   TEE WITHOUT CARDIOVERSION N/A 02/18/2016   Procedure: TRANSESOPHAGEAL ECHOCARDIOGRAM (TEE);  Surgeon: Sherren Mocha, MD;  Location: [facility];  Service: Open Heart Surgery;  Laterality: N/A;   TONSILLECTOMY  ~ 1950   TRANSCATHETER AORTIC VALVE REPLACEMENT, TRANSFEMORAL N/A 02/18/2016   Procedure: TRANSCATHETER AORTIC VALVE REPLACEMENT, TRANSFEMORAL;  Surgeon: Sherren Mocha, MD;  Location: [facility];  Service: Open Heart Surgery;  Laterality: N/A;    Allergies  No  Active Allergies  History of Present Illness    [name] is a 75 y.o. male with a hx of hx of  LBBB, angina, CHF, CAD s/p CABG and previous PCI, HLD, and HTN presents today for evaluation of chest pain.  last seen 11/13/2020 by Karren Cobble, [place].  He has a known history of coronary disease and is status post CABG in 1996. This included an LIMA graft to the LAD and diagonal, sequential vein graft to the acute marginal, PDA, and posterior lateral branches of the right coronary, sequential saphenous vein graft to the obtuse marginal vessel and distal circumflex. He had a cardiac catheterization in 2007 which showed that his grafts were all patent. On 04/02/14 he had repeat cath for chest pain and grafts were again patent but there was a 90% stenosis in the SVG to OM2. This was stented with a DES.  Despite this intervention he has continued to have chest pain. He had cholecystectomy in January 2353 without complications and without relief of his pain. CXR was unremarkable. CT of the chest was negative for PE or aortic pathology. He had EGD by Dr. Oletta Lamas that showed Barrett's esophagus but no evidence of esophagitis. He is on Protonix.    In Dec 2016  he was admitted for evaluation of syncope. Echo showed EF 30-35% with moderate to severe AS. Mean gradient 24 mm Hg. AVA 0.8-0.9. There was concern that AVA was underestimated due to low output. EEG was negative. He had a LBBB. He underwent placement of a BIV-ICD by Dr. Lovena Le.  He then  had a repeat Echo showing some increase in AV gradient. He underwent a Dobutamine Echo. This showed improvement in EF to 40-45%. There was also increase in AV gradient with a mean gradient of 41 mm Hg and severe AS. He underwent uncomplicated TAVR 61/44/3154 via a percutaneous right transfemoral approach. He was treated with a 26 mm Edwards Sapien 3 transcatheter heart valve. Repeat Echo in March 2018 showed improvement in EF to 40-45%. Echo in February 2019 was unchanged.     Despite all these interventions he has continued to have chest pain. He previously described his pain as right parasternal radiating to his jaw. It is relieved with sl Ntg. Typically occurs at night. He was able to do heavy yard work such as lifting, hauling, and blowing leaves without change. He does have some SOB. We did switch to him taking Imdur at bedtime and this seemed to help.   Pacemaker check in January was stable.    On pacer check in April he was noted to have AFib lasting 6.5 hours. He was asymptomatic. Was seen in Afib clinic and started on Eliquis. Echo was updated and showed significant worsening of LV function with EF 25-30%. Mild to moderate MR. TAVR functioning well. Severe LAE and moderate RAE. Seen today with complaints of SOB. His pacer check in July showed no recurrent Afib.   He was seen for further  medication titration about a month ago but prior to that he was  seen by Dr. [name] back in 10/2020 and at that time the patient complained of SOB and LEE and was taking 200 NGT a month. He reported more SOB and was having a hard time doing any yard work without stopping. He does get short of breath at night and has been sleeping sitting up. He notes LE edema but has attributed this to his neuropathy. He continued to have chest pain. States that this pattern has not changed.  Today, he presents for an evaluation of his chest pain which has become worse over the last 1 to 2 months.  It has become more severe and more frequent mostly with activity.  It has progressed in severity and now occurs when he is just walking to the mailbox and back. He has had shortness of breath with activity and at rest.  He has lost his appetite because every time he eats he also gets chest pain.  He has never been able to determine if the chest pain is from indigestion or cardiac related. He has been taking his Eliquis but experiencing nosebleeds.  He has 1 about every 3 to 4 weeks.  He usually has to pack  his nostrils and they stop pretty quickly.  He has noticed more swelling in his bilateral lower extremities but attributes this to lack of activity due to his chest pain.  He also endorses neuropathy and sometimes cannot tell the difference between neuropathy and swelling.  His weight has been stable.  He does not experience any palpitations or fluttering in his chest.  When he has associated shortness of breath he does notice that his heart rate accelerates.  He can lay flat but does not lay flat because he does not sleep much at night.  He usually is up at night and takes naps during the day.  He has never woken up from a nap short of breath.  His blood pressure is little bit elevated today 152/76 and he does not usually take it at home.  He is compliant with all his medications and I have encouraged him to purchase a blood pressure cuff and take his blood pressures for 2 to 3 weeks and send them to me via MyChart.  He is also encouraged to take daily weights and record those as well.  We have decided to set him up with a cardiac catheterization today for his worsening chest pain.  We discussed this with Dr. [name] who has availability on Thursday.  We will obtain labs today including a CBC and a BMP.  No orthopnea, PND. Reports no palpitations.     EKGs/Labs/Other Studies Reviewed:   The following studies were reviewed today:  ECHO 06/25/2020  Impression  1. Left ventricular ejection fraction, by estimation, is 25 to 30%. The left ventricle has severely decreased function. The left ventricle demonstrates global hypokinesis. The left ventricular internal cavity size was moderately dilated. There is moderate left ventricular hypertrophy. Left ventricular diastolic parameters are consistent with Grade II diastolic dysfunction (pseudonormalization). 2. Right ventricular systolic function is moderately reduced. The right ventricular size is mildly enlarged. 3. Left atrial size was severely  dilated. 4. Right atrial size was moderately dilated. 5. The mitral valve is normal in structure. Mild to moderate mitral valve regurgitation. 6. There is a small perivalvular leak.. The aortic valve has been repaired/replaced. Aortic valve regurgitation is trivial.  EKG:  EKG is  ordered today.  The  ekg ordered today demonstrates biventricular pacemaker detected, consistent with previous EKGs.   Recent Labs: 04/10/2020: Hemoglobin 14.1; Platelets 189 07/11/2020: ALT 21 11/13/2020: BUN 26; Creatinine, Ser 1.34; Potassium 4.2; Sodium 138  Recent Lipid Panel    Component Value Date/Time   CHOL 120 07/11/2020 1015   TRIG 65 07/11/2020 1015   HDL 41 07/11/2020 1015   CHOLHDL 2.9 07/11/2020 1015   CHOLHDL 3.9 03/12/2015 0932   VLDL 21 03/12/2015 0932   LDLCALC 65 07/11/2020 1015     Home Medications   Current Meds  Medication Sig   amoxicillin (AMOXIL) 500 MG tablet Take 2g (four 500 mg tablets) one hour prior to dental procedures.   apixaban (ELIQUIS) 5 MG TABS tablet Take 1 tablet (5 mg total) by mouth 2 (two) times daily.   Ascorbic Acid (VITAMIN C) 1000 MG tablet Take 1,000 mg by mouth daily.   atorvastatin (LIPITOR) 80 MG tablet Take 1 tablet (80 mg total) by mouth daily.   Cholecalciferol (VITAMIN D-3) 5000 units TABS Take 5,000 Units by mouth daily.    finasteride (PROSCAR) 5 MG tablet Take 5 mg by mouth daily.   furosemide (LASIX) 20 MG tablet Take 1 tablet (20 mg total) by mouth daily.   isosorbide mononitrate (IMDUR) 120 MG 24 hr tablet Take 1 tablet (120 mg total) by mouth daily.   metoprolol succinate (TOPROL-XL) 50 MG 24 hr tablet TAKE ONE TABLET BY MOUTH DAILY WITH OR IMMEDIATELY FOLLOWING A MEAL   nitroGLYCERIN (NITROSTAT) 0.4 MG SL tablet DISSOLVE 1 TABLET UNDER TONGUE FOR CHEST PAIN - IF PAIN REMAINS AFTER 5 MIN, CALL 911 AND REPEAT DOSE. MAX 3 TABLETS IN 15 MINUTES FOR 30 DAY SUPPLY   oxybutynin (DITROPAN) 5 MG tablet Take 5 mg by mouth daily.    pantoprazole  (PROTONIX) 40 MG tablet Take 1 tablet (40 mg total) by mouth daily.   ranolazine (RANEXA) 500 MG 12 hr tablet Take 1 tablet (500 mg total) by mouth 2 (two) times daily.   sacubitril-valsartan (ENTRESTO) 24-26 MG Take 1 tablet by mouth 2 (two) times daily.   valACYclovir (VALTREX) 500 MG tablet TAKE ONE TABLET BY MOUTH DAILY AS NEEDED FOR OUTBREAK     Review of Systems      All other systems reviewed and are otherwise negative except as noted above.  Physical Exam    VS:  BP (!) 152/76    Pulse 79    Ht 5\' 10"  (1.778 m)    Wt 215 lb (97.5 kg)    SpO2 97%    BMI 30.85 kg/m  , BMI Body mass index is 30.85 kg/m.  Wt Readings from Last 3 Encounters:  12/24/20 215 lb (97.5 kg)  11/13/20 214 lb 12.8 oz (97.4 kg)  10/29/20 222 lb (100.7 kg)     GEN: Well nourished, well developed, in no acute distress. HEENT: normal. Neck: Supple, no JVD, carotid bruits, or masses. Cardiac: RRR, no murmurs, rubs, or gallops. No clubbing, cyanosis, 1-2+ pitting edema.  Radials/PT 2+ and equal bilaterally.  Respiratory:  Respirations regular and unlabored, clear to auscultation bilaterally. GI: Soft, nontender, nondistended. MS: No deformity or atrophy. Skin: Warm and dry, no rash. Neuro:  Strength and sensation are intact. Psych: Normal affect.  Assessment & Plan    Unstable angina/history of CAD with previous stenting and CABG -Over the past 1 to 2 months he has experienced more frequent more severe unstable angina.  He also have associated shortness of breath with activity and at  rest. -He reports taking as much is 200 tablets of sublingual nitroglycerin over the last month.  In addition he is on Imdur and Ranexa -After discussing the patient with Dr. [name], we have decided to schedule a cardiac catheterization with possible PCI -We have ordered a CBC and BMP today for preop labs -He will hold his Eliquis today for the next 36 hours preprocedure.  We have scheduled his cardiac catheterization on  Thursday with Dr. [name] -He has been experiencing nosebleeds every 3 to 4 weeks on his Eliquis.  We will review his CBC once completed.  Hypertension -Elevated today in the clinic.  BP 152/76 -He does not have a blood pressure cuff at home but is willing to purchase one and keep track of his blood pressures for the next 2 to 3 weeks and send me a message via MyChart -Continue current medication regimen of Entresto and Toprol XL  Hyperlipidemia -Recent lipid panel was done 06/2020 which showed HDL 41, LDL 65, total cholesterol 120, triglycerides 65 -Continue Zetia and Lipitor -He will need a follow-up lipid panel in the next 6 months.  Well-controlled at this time  CHF -Appears volume overloaded on exam today with 1-2+ pitting edema bilaterally in his lower extremities.  He states this is associated with lack of activity due to his chest pain -The patient states that his weight has been stable -Encouraged a low-sodium diet -He is on daily Lasix 20 mg -Encourage daily weights and to record these weights and send them to me via MyChart -If LVEDP elevated on cath then may need to increase Lasix for better fluid optimization   GERD -Continue Protonix and as needed antacids -He states that sometimes he also takes a nitroglycerin when having acid reflux symptoms  Disposition: Follow up in 2 week(s) with Peter [name], MD or APP.  Signed, Elgie Collard, PA-C 12/24/2020, 3:51 PM [facility]

## 2020-12-24 NOTE — Patient Instructions (Signed)
Medication Instructions:  No Changes *If you need a refill on your cardiac medications before your next appointment, please call your pharmacy*   Lab Work: BMET, CBC : Today If you have labs (blood work) drawn today and your tests are completely normal, you will receive your results only by: [place] (if you have MyChart) OR A paper copy in the mail If you have any lab test that is abnormal or we need to change your treatment, we will call you to review the results.   Testing/Procedures:  [facility] [place] [address] Dept: [phone] Loc: [phone]  [name]  12/24/2020  You are scheduled for a Cardiac Catheterization on Thursday, December 15 with Dr. Peter [name].  1. Please arrive at the [facility] (Main Entrance A) at [facility]: [address] [place], [place] [address] at 11:30 AM (This time is two hours before your procedure to ensure your preparation). Free valet parking service is available.   Special note: Every effort is made to have your procedure done on time. Please understand that emergencies sometimes delay scheduled procedures.  2. Diet: Do not eat solid foods after midnight.  The patient may have clear liquids until 5am upon the day of the procedure.  3. Labs: You will need to have blood drawn on Monday, December 13 at [facility]  Open: 8am - 5pm (Lunch 12:30 - 1:30)   Phone: [phone]. You do not need to be fasting.  4. Medication instructions in preparation for your procedure:   Contrast Allergy: No    Current Outpatient Medications (Cardiovascular):    atorvastatin (LIPITOR) 80 MG tablet, Take 1 tablet (80 mg total) by mouth daily.   furosemide (LASIX) 20 MG tablet, Take 1 tablet (20 mg total) by mouth daily.   isosorbide mononitrate (IMDUR) 120 MG 24 hr tablet, Take 1 tablet (120  mg total) by mouth daily.   metoprolol succinate (TOPROL-XL) 50 MG 24 hr tablet, TAKE ONE TABLET BY MOUTH DAILY WITH OR IMMEDIATELY FOLLOWING A MEAL   nitroGLYCERIN (NITROSTAT) 0.4 MG SL tablet, DISSOLVE 1 TABLET UNDER TONGUE FOR CHEST PAIN - IF PAIN REMAINS AFTER 5 MIN, CALL 911 AND REPEAT DOSE. MAX 3 TABLETS IN 15 MINUTES FOR 30 DAY SUPPLY   ranolazine (RANEXA) 500 MG 12 hr tablet, Take 1 tablet (500 mg total) by mouth 2 (two) times daily.   sacubitril-valsartan (ENTRESTO) 24-26 MG, Take 1 tablet by mouth 2 (two) times daily.    Current Outpatient Medications (Hematological):    apixaban (ELIQUIS) 5 MG TABS tablet, Take 1 tablet (5 mg total) by mouth 2 (two) times daily.  Current Outpatient Medications (Other):    amoxicillin (AMOXIL) 500 MG tablet, Take 2g (four 500 mg tablets) one hour prior to dental procedures.   Ascorbic Acid (VITAMIN C) 1000 MG tablet, Take 1,000 mg by mouth daily.   Cholecalciferol (VITAMIN D-3) 5000 units TABS, Take 5,000 Units by mouth daily.    finasteride (PROSCAR) 5 MG tablet, Take 5 mg by mouth daily.   oxybutynin (DITROPAN) 5 MG tablet, Take 5 mg by mouth daily.    pantoprazole (PROTONIX) 40 MG tablet, Take 1 tablet (40 mg total) by mouth daily.   valACYclovir (VALTREX) 500 MG tablet, TAKE ONE TABLET BY MOUTH DAILY AS NEEDED FOR OUTBREAK *For reference purposes while preparing patient instructions.   Delete this med list prior to printing instructions for  patient.*  Stop taking Eliquis (Apixiban) on Monday, December 13.  Stop taking, Lasix (Furosemide)  Wednesday, December 15,  Hold Entresto Day of Procedure    On the morning of your procedure, take your and any morning medicines NOT listed above.  You may use sips of water.  5. Plan for one night stay--bring personal belongings. 6. Bring a current list of your medications and current insurance cards. 7. You MUST have a responsible person to drive you home. 8. Someone MUST be with you the first 24  hours after you arrive home or your discharge will be delayed. 9. Please wear clothes that are easy to get on and off and wear slip-on shoes.  Thank you for allowing [place] to care for you!   -- [facility]  Follow-Up: At [facility], you and your health needs are our priority.  As part of our continuing mission to provide you with exceptional heart care, we have created designated Provider Care Teams.  These Care Teams include your primary Cardiologist (physician) and Advanced Practice Providers (APPs -  Physician Assistants and Nurse Practitioners) who all work together to provide you with the care you need, when you need it.  We recommend signing up for the patient portal called "MyChart".  Sign up information is provided on this After Visit Summary.  MyChart is used to connect with patients for Virtual Visits (Telemedicine).  Patients are able to view lab/test results, encounter notes, upcoming appointments, etc.  Non-urgent messages can be sent to your provider as well.   To learn more about what you can do with MyChart, go to [email or web address].    Your next appointment:   2-3 week(s)  The format for your next appointment:   In Person  Provider:   Sande Rives, PA-C    Then, Peter [name], MD will plan to see you again in 2 month(s).{`    Other Instructions

## 2020-12-24 NOTE — Telephone Encounter (Signed)
Pt states that his intermittent CP has worsened over the last few days.  He is taking about 12 nitro a day.  Denies any CP since he called yesterday.  States he use to be able to get out in the yard and do more physical activity but now develops CP more easily.  Gets SOB and dizzy with episodes.  Pt prefers to come in and be seen vs going to the hospital.  Scheduled pt to come in today to see Sande Rives, PA-C for eval.

## 2020-12-25 ENCOUNTER — Telehealth: Payer: Self-pay | Admitting: *Deleted

## 2020-12-25 LAB — BASIC METABOLIC PANEL
BUN/Creatinine Ratio: 19 (ref 10–24)
BUN: 25 mg/dL (ref 8–27)
CO2: 20 mmol/L (ref 20–29)
Calcium: 9.7 mg/dL (ref 8.6–10.2)
Chloride: 103 mmol/L (ref 96–106)
Creatinine, Ser: 1.29 mg/dL — ABNORMAL HIGH (ref 0.76–1.27)
Glucose: 109 mg/dL — ABNORMAL HIGH (ref 70–99)
Potassium: 4.1 mmol/L (ref 3.5–5.2)
Sodium: 140 mmol/L (ref 134–144)
eGFR: 57 mL/min/{1.73_m2} — ABNORMAL LOW (ref >59)

## 2020-12-25 LAB — CBC
Hematocrit: 35.3 % — ABNORMAL LOW (ref 37.5–51.0)
Hemoglobin: 11.6 g/dL — ABNORMAL LOW (ref 13.0–17.7)
MCH: 30 pg (ref 26.6–33.0)
MCHC: 32.9 g/dL (ref 31.5–35.7)
MCV: 91 fL (ref 79–97)
Platelets: 301 10*3/uL (ref 150–450)
RBC: 3.87 x10E6/uL — ABNORMAL LOW (ref 4.14–5.80)
RDW: 13 % (ref 11.6–15.4)
WBC: 7.6 10*3/uL (ref 3.4–10.8)

## 2020-12-25 NOTE — Telephone Encounter (Signed)
Cardiac catheterization scheduled at [facility] for: Thursday December 26, 2020 1:30 PM [facility] Main Entrance A [facility]) at: 11:30 AM   Diet-no solid food after midnight prior to cath, clear liquids until 5 AM day of procedure.  Medication instructions for procedure: -Hold:  Eliquis-last dose AM 12/24/20 until post procedure  Lasix/Entresto-pt reports he is not currently taking either of these medications  -Except hold medications usual morning medications can be taken pre-cath with sips of water including aspirin 81 mg.    Confirmed patient has responsible adult to drive home post procedure and be with patient first 24 hours after arriving home.  [facility] does allow one visitor to accompany you and wait in the hospital waiting room while you are there for your procedure. You and your visitor will be asked to wear a mask once you enter the hospital.   Patient reports does not currently have any new symptoms concerning for COVID-19 and no household members with COVID-19 like illness.    Reviewed procedure /mask/visitor instructions with patient.

## 2020-12-26 ENCOUNTER — Inpatient Hospital Stay (HOSPITAL_COMMUNITY)
Admission: RE | Admit: 2020-12-26 | Discharge: 2020-12-30 | DRG: 246 | Disposition: A | Discharge status: Home / Self Care | Payer: Medicare HMO | Source: Ambulatory Visit | Attending: Cardiology | Admitting: Cardiology

## 2020-12-26 ENCOUNTER — Encounter (HOSPITAL_COMMUNITY)
Admission: RE | Disposition: A | Discharge status: Home / Self Care | Payer: Self-pay | Source: Ambulatory Visit | Attending: Cardiology

## 2020-12-26 ENCOUNTER — Other Ambulatory Visit: Payer: Self-pay

## 2020-12-26 DIAGNOSIS — I350 Nonrheumatic aortic (valve) stenosis: Secondary | ICD-10-CM | POA: Diagnosis present

## 2020-12-26 DIAGNOSIS — I169 Hypertensive crisis, unspecified: Secondary | ICD-10-CM | POA: Diagnosis present

## 2020-12-26 DIAGNOSIS — I214 Non-ST elevation (NSTEMI) myocardial infarction: Secondary | ICD-10-CM | POA: Diagnosis present

## 2020-12-26 DIAGNOSIS — K2270 Barrett's esophagus without dysplasia: Secondary | ICD-10-CM | POA: Diagnosis present

## 2020-12-26 DIAGNOSIS — K219 Gastro-esophageal reflux disease without esophagitis: Secondary | ICD-10-CM | POA: Diagnosis present

## 2020-12-26 DIAGNOSIS — G629 Polyneuropathy, unspecified: Secondary | ICD-10-CM | POA: Diagnosis present

## 2020-12-26 DIAGNOSIS — Z7901 Long term (current) use of anticoagulants: Secondary | ICD-10-CM

## 2020-12-26 DIAGNOSIS — Z6829 Body mass index (BMI) 29.0-29.9, adult: Secondary | ICD-10-CM

## 2020-12-26 DIAGNOSIS — Z952 Presence of prosthetic heart valve: Secondary | ICD-10-CM

## 2020-12-26 DIAGNOSIS — I447 Left bundle-branch block, unspecified: Secondary | ICD-10-CM | POA: Diagnosis present

## 2020-12-26 DIAGNOSIS — I5021 Acute systolic (congestive) heart failure: Secondary | ICD-10-CM

## 2020-12-26 DIAGNOSIS — I5023 Acute on chronic systolic (congestive) heart failure: Secondary | ICD-10-CM | POA: Diagnosis present

## 2020-12-26 DIAGNOSIS — I2582 Chronic total occlusion of coronary artery: Secondary | ICD-10-CM | POA: Diagnosis present

## 2020-12-26 DIAGNOSIS — E7800 Pure hypercholesterolemia, unspecified: Secondary | ICD-10-CM | POA: Diagnosis present

## 2020-12-26 DIAGNOSIS — I130 Hypertensive heart and chronic kidney disease with heart failure and stage 1 through stage 4 chronic kidney disease, or unspecified chronic kidney disease: Secondary | ICD-10-CM | POA: Diagnosis present

## 2020-12-26 DIAGNOSIS — Z7982 Long term (current) use of aspirin: Secondary | ICD-10-CM

## 2020-12-26 DIAGNOSIS — I480 Paroxysmal atrial fibrillation: Secondary | ICD-10-CM | POA: Diagnosis present

## 2020-12-26 DIAGNOSIS — Z87442 Personal history of urinary calculi: Secondary | ICD-10-CM | POA: Diagnosis not present

## 2020-12-26 DIAGNOSIS — I25710 Atherosclerosis of autologous vein coronary artery bypass graft(s) with unstable angina pectoris: Principal | ICD-10-CM | POA: Diagnosis present

## 2020-12-26 DIAGNOSIS — Z79899 Other long term (current) drug therapy: Secondary | ICD-10-CM | POA: Diagnosis not present

## 2020-12-26 DIAGNOSIS — N1831 Chronic kidney disease, stage 3a: Secondary | ICD-10-CM | POA: Diagnosis present

## 2020-12-26 DIAGNOSIS — Z953 Presence of xenogenic heart valve: Secondary | ICD-10-CM

## 2020-12-26 DIAGNOSIS — Z95810 Presence of automatic (implantable) cardiac defibrillator: Secondary | ICD-10-CM | POA: Diagnosis not present

## 2020-12-26 DIAGNOSIS — I517 Cardiomegaly: Secondary | ICD-10-CM | POA: Diagnosis not present

## 2020-12-26 DIAGNOSIS — R0602 Shortness of breath: Secondary | ICD-10-CM | POA: Diagnosis not present

## 2020-12-26 DIAGNOSIS — G4733 Obstructive sleep apnea (adult) (pediatric): Secondary | ICD-10-CM | POA: Diagnosis present

## 2020-12-26 DIAGNOSIS — Z7984 Long term (current) use of oral hypoglycemic drugs: Secondary | ICD-10-CM | POA: Diagnosis not present

## 2020-12-26 DIAGNOSIS — Z951 Presence of aortocoronary bypass graft: Secondary | ICD-10-CM

## 2020-12-26 DIAGNOSIS — I25110 Atherosclerotic heart disease of native coronary artery with unstable angina pectoris: Secondary | ICD-10-CM | POA: Diagnosis present

## 2020-12-26 DIAGNOSIS — I255 Ischemic cardiomyopathy: Secondary | ICD-10-CM | POA: Diagnosis present

## 2020-12-26 DIAGNOSIS — E669 Obesity, unspecified: Secondary | ICD-10-CM | POA: Diagnosis present

## 2020-12-26 DIAGNOSIS — I5022 Chronic systolic (congestive) heart failure: Secondary | ICD-10-CM | POA: Diagnosis present

## 2020-12-26 DIAGNOSIS — I200 Unstable angina: Secondary | ICD-10-CM | POA: Diagnosis not present

## 2020-12-26 HISTORY — PX: LEFT HEART CATH AND CORS/GRAFTS ANGIOGRAPHY: CATH118250

## 2020-12-26 HISTORY — PX: CORONARY STENT INTERVENTION: CATH118234

## 2020-12-26 LAB — POTASSIUM: Potassium: 3.3 mmol/L — ABNORMAL LOW (ref 3.5–5.1)

## 2020-12-26 LAB — POCT ACTIVATED CLOTTING TIME
Activated Clotting Time: 353 seconds
Activated Clotting Time: 179 seconds
Activated Clotting Time: 167 seconds

## 2020-12-26 LAB — MAGNESIUM: Magnesium: 1.9 mg/dL (ref 1.7–2.4)

## 2020-12-26 SURGERY — LEFT HEART CATH AND CORS/GRAFTS ANGIOGRAPHY
Anesthesia: LOCAL

## 2020-12-26 MED ORDER — NITROGLYCERIN IN D5W 200-5 MCG/ML-% IV SOLN
5.0000 ug/min | INTRAVENOUS | Status: DC
  Administered 2020-12-26: 10 ug/min via INTRAVENOUS
  Filled 2020-12-26: qty 250

## 2020-12-26 MED ORDER — SODIUM CHLORIDE 0.9 % IV SOLN: 250.0000 mL | INTRAVENOUS | Status: DC | PRN

## 2020-12-26 MED ORDER — NITROGLYCERIN 1 MG/10 ML FOR IR/CATH LAB
INTRA_ARTERIAL | Status: AC
  Filled 2020-12-26: qty 10

## 2020-12-26 MED ORDER — TICAGRELOR 90 MG PO TABS
ORAL_TABLET | ORAL | Status: AC
  Filled 2020-12-26: qty 1

## 2020-12-26 MED ORDER — ATORVASTATIN CALCIUM 80 MG PO TABS
80.0000 mg | ORAL_TABLET | Freq: Every day | ORAL | Status: DC
  Administered 2020-12-27 – 2020-12-30 (×4): 80 mg via ORAL
  Filled 2020-12-26 (×4): qty 1

## 2020-12-26 MED ORDER — IOHEXOL 350 MG/ML SOLN
INTRAVENOUS | Status: DC | PRN
  Administered 2020-12-26: 155 mL

## 2020-12-26 MED ORDER — SODIUM CHLORIDE 0.9 % IV SOLN: INTRAVENOUS | Status: DC

## 2020-12-26 MED ORDER — BIVALIRUDIN BOLUS VIA INFUSION - CUPID
INTRAVENOUS | Status: DC | PRN
  Administered 2020-12-26: 71.1 mg via INTRAVENOUS

## 2020-12-26 MED ORDER — LIDOCAINE HCL (PF) 1 % IJ SOLN
INTRAMUSCULAR | Status: AC
  Filled 2020-12-26: qty 30

## 2020-12-26 MED ORDER — FUROSEMIDE 10 MG/ML IJ SOLN
INTRAMUSCULAR | Status: AC
  Filled 2020-12-26: qty 8

## 2020-12-26 MED ORDER — TICAGRELOR 90 MG PO TABS
ORAL_TABLET | ORAL | Status: DC | PRN
  Administered 2020-12-26: 180 mg via ORAL

## 2020-12-26 MED ORDER — HEPARIN (PORCINE) IN NACL 1000-0.9 UT/500ML-% IV SOLN
INTRAVENOUS | Status: AC
  Filled 2020-12-26: qty 1000

## 2020-12-26 MED ORDER — SODIUM CHLORIDE 0.9% FLUSH: 3.0000 mL | INTRAVENOUS | Status: DC | PRN

## 2020-12-26 MED ORDER — NITROGLYCERIN 0.4 MG SL SUBL: 0.4000 mg | SUBLINGUAL_TABLET | SUBLINGUAL | Status: DC | PRN

## 2020-12-26 MED ORDER — MAGNESIUM SULFATE 2 GM/50ML IV SOLN
2.0000 g | Freq: Once | INTRAVENOUS | Status: AC
  Administered 2020-12-26: 2 g via INTRAVENOUS
  Filled 2020-12-26: qty 50

## 2020-12-26 MED ORDER — HYDRALAZINE HCL 20 MG/ML IJ SOLN: 10.0000 mg | INTRAMUSCULAR | Status: AC | PRN

## 2020-12-26 MED ORDER — FENTANYL CITRATE (PF) 100 MCG/2ML IJ SOLN
INTRAMUSCULAR | Status: DC | PRN
  Administered 2020-12-26: 25 ug via INTRAVENOUS
  Administered 2020-12-26: 50 ug via INTRAVENOUS
  Administered 2020-12-26: 25 ug via INTRAVENOUS

## 2020-12-26 MED ORDER — NITROGLYCERIN 0.4 MG SL SUBL
SUBLINGUAL_TABLET | SUBLINGUAL | Status: AC
  Filled 2020-12-26: qty 1

## 2020-12-26 MED ORDER — FUROSEMIDE 10 MG/ML IJ SOLN
INTRAMUSCULAR | Status: AC
  Filled 2020-12-26: qty 4

## 2020-12-26 MED ORDER — SODIUM CHLORIDE 0.9 % IV SOLN
INTRAVENOUS | Status: DC | PRN
  Administered 2020-12-26: 1.75 mg/kg/h via INTRAVENOUS

## 2020-12-26 MED ORDER — BIVALIRUDIN TRIFLUOROACETATE 250 MG IV SOLR
INTRAVENOUS | Status: AC
  Filled 2020-12-26: qty 250

## 2020-12-26 MED ORDER — ACETAMINOPHEN 325 MG PO TABS
650.0000 mg | ORAL_TABLET | ORAL | Status: DC | PRN
  Administered 2020-12-27: 650 mg via ORAL
  Filled 2020-12-26: qty 2

## 2020-12-26 MED ORDER — RAMIPRIL 5 MG PO CAPS
5.0000 mg | ORAL_CAPSULE | Freq: Every day | ORAL | Status: DC
  Administered 2020-12-27: 5 mg via ORAL
  Filled 2020-12-26 (×2): qty 1

## 2020-12-26 MED ORDER — ASPIRIN EC 81 MG PO TBEC: 81.0000 mg | DELAYED_RELEASE_TABLET | Freq: Every day | ORAL | Status: DC

## 2020-12-26 MED ORDER — RANOLAZINE ER 500 MG PO TB12
500.0000 mg | ORAL_TABLET | Freq: Two times a day (BID) | ORAL | Status: DC
  Administered 2020-12-26 – 2020-12-30 (×8): 500 mg via ORAL
  Filled 2020-12-26 (×8): qty 1

## 2020-12-26 MED ORDER — ONDANSETRON HCL 4 MG/2ML IJ SOLN: 4.0000 mg | Freq: Four times a day (QID) | INTRAMUSCULAR | Status: DC | PRN

## 2020-12-26 MED ORDER — CLOPIDOGREL BISULFATE 75 MG PO TABS
75.0000 mg | ORAL_TABLET | Freq: Every day | ORAL | Status: DC
  Administered 2020-12-28 – 2020-12-30 (×3): 75 mg via ORAL
  Filled 2020-12-26 (×3): qty 1

## 2020-12-26 MED ORDER — NITROGLYCERIN 1 MG/10 ML FOR IR/CATH LAB
INTRA_ARTERIAL | Status: DC | PRN
  Administered 2020-12-26 (×3): 200 ug via INTRACORONARY

## 2020-12-26 MED ORDER — ASPIRIN 81 MG PO CHEW
81.0000 mg | CHEWABLE_TABLET | Freq: Every day | ORAL | Status: DC
  Administered 2020-12-26 – 2020-12-30 (×5): 81 mg via ORAL
  Filled 2020-12-26 (×5): qty 1

## 2020-12-26 MED ORDER — ISOSORBIDE MONONITRATE ER 60 MG PO TB24
120.0000 mg | ORAL_TABLET | Freq: Every day | ORAL | Status: DC
  Administered 2020-12-27 – 2020-12-30 (×4): 120 mg via ORAL
  Filled 2020-12-26 (×4): qty 2

## 2020-12-26 MED ORDER — FENTANYL CITRATE (PF) 100 MCG/2ML IJ SOLN
INTRAMUSCULAR | Status: AC
  Filled 2020-12-26: qty 2

## 2020-12-26 MED ORDER — FUROSEMIDE 20 MG PO TABS
20.0000 mg | ORAL_TABLET | Freq: Every day | ORAL | Status: DC
  Administered 2020-12-26: 20 mg via ORAL
  Filled 2020-12-26 (×2): qty 1

## 2020-12-26 MED ORDER — FUROSEMIDE 10 MG/ML IJ SOLN
INTRAMUSCULAR | Status: DC | PRN
  Administered 2020-12-26: 40 mg via INTRAVENOUS

## 2020-12-26 MED ORDER — CLOPIDOGREL BISULFATE 300 MG PO TABS
300.0000 mg | ORAL_TABLET | Freq: Once | ORAL | Status: AC
  Administered 2020-12-27: 300 mg via ORAL
  Filled 2020-12-26: qty 1

## 2020-12-26 MED ORDER — SODIUM CHLORIDE 0.9% FLUSH: 3.0000 mL | Freq: Two times a day (BID) | INTRAVENOUS | Status: DC

## 2020-12-26 MED ORDER — HEPARIN (PORCINE) IN NACL 1000-0.9 UT/500ML-% IV SOLN
INTRAVENOUS | Status: DC | PRN
  Administered 2020-12-26: 500 mL

## 2020-12-26 MED ORDER — LIDOCAINE HCL (PF) 1 % IJ SOLN
INTRAMUSCULAR | Status: DC | PRN
  Administered 2020-12-26: 14 mL

## 2020-12-26 MED ORDER — NITROGLYCERIN IN D5W 200-5 MCG/ML-% IV SOLN
INTRAVENOUS | Status: AC
  Filled 2020-12-26: qty 250

## 2020-12-26 MED ORDER — SODIUM CHLORIDE 0.9% FLUSH
3.0000 mL | Freq: Two times a day (BID) | INTRAVENOUS | Status: DC
  Administered 2020-12-26 – 2020-12-30 (×7): 3 mL via INTRAVENOUS

## 2020-12-26 MED ORDER — POTASSIUM CHLORIDE ER 10 MEQ PO TBCR
40.0000 meq | EXTENDED_RELEASE_TABLET | Freq: Once | ORAL | Status: AC
  Administered 2020-12-26: 40 meq via ORAL
  Filled 2020-12-26 (×2): qty 4

## 2020-12-26 MED ORDER — HEPARIN (PORCINE) IN NACL 2000-0.9 UNIT/L-% IV SOLN
INTRAVENOUS | Status: DC | PRN
  Administered 2020-12-26: 1000 mL

## 2020-12-26 MED ORDER — FUROSEMIDE 10 MG/ML IJ SOLN
80.0000 mg | Freq: Once | INTRAMUSCULAR | Status: AC
  Administered 2020-12-26: 80 mg via INTRAVENOUS

## 2020-12-26 MED ORDER — LABETALOL HCL 5 MG/ML IV SOLN: 10.0000 mg | INTRAVENOUS | Status: AC | PRN

## 2020-12-26 MED ORDER — NITROGLYCERIN 0.4 MG SL SUBL
SUBLINGUAL_TABLET | SUBLINGUAL | Status: DC | PRN
  Administered 2020-12-26: .4 mg via SUBLINGUAL

## 2020-12-26 MED ORDER — METOPROLOL SUCCINATE ER 50 MG PO TB24
50.0000 mg | ORAL_TABLET | Freq: Every day | ORAL | Status: DC
  Administered 2020-12-27 – 2020-12-30 (×4): 50 mg via ORAL
  Filled 2020-12-26 (×4): qty 1

## 2020-12-26 MED ORDER — OXYBUTYNIN CHLORIDE 5 MG PO TABS
5.0000 mg | ORAL_TABLET | Freq: Every day | ORAL | Status: DC
  Administered 2020-12-27 – 2020-12-30 (×4): 5 mg via ORAL
  Filled 2020-12-26 (×4): qty 1

## 2020-12-26 MED ORDER — PANTOPRAZOLE SODIUM 40 MG PO TBEC
40.0000 mg | DELAYED_RELEASE_TABLET | Freq: Every day | ORAL | Status: DC
  Administered 2020-12-27 – 2020-12-30 (×4): 40 mg via ORAL
  Filled 2020-12-26 (×4): qty 1

## 2020-12-26 MED ORDER — FINASTERIDE 5 MG PO TABS
5.0000 mg | ORAL_TABLET | Freq: Every day | ORAL | Status: DC
  Administered 2020-12-27 – 2020-12-30 (×4): 5 mg via ORAL
  Filled 2020-12-26 (×4): qty 1

## 2020-12-26 MED ORDER — VITAMIN D 25 MCG (1000 UNIT) PO TABS
5000.0000 [IU] | ORAL_TABLET | Freq: Every day | ORAL | Status: DC
  Administered 2020-12-27 – 2020-12-30 (×4): 5000 [IU] via ORAL
  Filled 2020-12-26 (×4): qty 5

## 2020-12-26 MED ORDER — MIDAZOLAM HCL 2 MG/2ML IJ SOLN
INTRAMUSCULAR | Status: DC | PRN
  Administered 2020-12-26: 2 mg via INTRAVENOUS

## 2020-12-26 MED ORDER — MIDAZOLAM HCL 2 MG/2ML IJ SOLN
INTRAMUSCULAR | Status: AC
  Filled 2020-12-26: qty 2

## 2020-12-26 SURGICAL SUPPLY — 27 items
BALLN SAPPHIRE 2.5X15 (BALLOONS) ×3
BALLN SAPPHIRE ~~LOC~~ 4.0X8 (BALLOONS) ×2 IMPLANT
BALLN ~~LOC~~ SAPPHIRE 4.5X8 (BALLOONS) ×3
BALLOON SAPPHIRE 2.5X15 (BALLOONS) IMPLANT
BALLOON ~~LOC~~ SAPPHIRE 4.5X8 (BALLOONS) IMPLANT
CATH EXPO 5F MPA-1 (CATHETERS) ×2 IMPLANT
CATH INFINITI 5 FR IM (CATHETERS) ×2 IMPLANT
CATH INFINITI 5FR JL4 (CATHETERS) ×2 IMPLANT
CATH INFINITI JR4 5F (CATHETERS) ×2 IMPLANT
CATH VISTA GUIDE 6FR MPA1 (CATHETERS) ×2 IMPLANT
CATHETER LAUNCHER 6FR MP1 (CATHETERS) ×2 IMPLANT
CATHETER LAUNCHER 6FR MP1 SH (CATHETERS) ×2 IMPLANT
DEVICE SPIDERFX EMB PROT 4MM (WIRE) ×2 IMPLANT
KIT ENCORE 26 ADVANTAGE (KITS) ×2 IMPLANT
KIT HEART LEFT (KITS) ×3 IMPLANT
PACK CARDIAC CATHETERIZATION (CUSTOM PROCEDURE TRAY) ×3 IMPLANT
SHEATH BRITE TIP 6FR 35CM (SHEATH) ×2 IMPLANT
SHEATH PINNACLE 5F 10CM (SHEATH) ×2 IMPLANT
SHEATH PINNACLE 6F 10CM (SHEATH) ×2 IMPLANT
SHEATH PROBE COVER 6X72 (BAG) ×2 IMPLANT
STENT ONYX FRONTIER 4.0X22 (Permanent Stent) ×2 IMPLANT
TRANSDUCER W/STOPCOCK (MISCELLANEOUS) ×3 IMPLANT
TUBING CIL FLEX 10 FLL-RA (TUBING) ×3 IMPLANT
WIRE ASAHI PROWATER 180CM (WIRE) ×2 IMPLANT
WIRE EMERALD 3MM-J .025X260CM (WIRE) ×2 IMPLANT
WIRE EMERALD 3MM-J .035X150CM (WIRE) ×2 IMPLANT
WIRE HI TORQ VERSACORE-J 145CM (WIRE) ×2 IMPLANT

## 2020-12-26 NOTE — Interval H&P Note (Signed)
History and Physical Interval Note:  12/26/2020 2:30 PM  [name]  has presented today for surgery, with the diagnosis of chest pain.  The various methods of treatment have been discussed with the patient and family. After consideration of risks, benefits and other options for treatment, the patient has consented to  Procedure(s): LEFT HEART CATH AND CORS/GRAFTS ANGIOGRAPHY (N/A) as a surgical intervention.  The patient's history has been reviewed, patient examined, no change in status, stable for surgery.  I have reviewed the patient's chart and labs.  Questions were answered to the patient's satisfaction.   Cath Lab Visit (complete for each Cath Lab visit)  Clinical Evaluation Leading to the Procedure:   ACS: Yes.    Non-ACS:    Anginal Classification: CCS IV  Anti-ischemic medical therapy: Maximal Therapy (2 or more classes of medications)  Non-Invasive Test Results: No non-invasive testing performed  Prior CABG: Previous CABG        [name] [facility] 12/26/2020 2:30 PM

## 2020-12-26 NOTE — Progress Notes (Signed)
86 French foley catheter inserted per order, approximately 850cc of yellow with pink tinged urine noted for return, sterility maintained, tolerated well, sheath remains intact to right groin with pressure bag noted, right groin remains soft, safety maintained

## 2020-12-26 NOTE — Progress Notes (Signed)
Dr [name] notified by cath cath lab staff of pt condition regarding increased bp, irregular breathing/dyspnea, Dr [name] to bedside, orders obtained

## 2020-12-26 NOTE — Progress Notes (Signed)
Patient assessed in the holding area post PCI. Visibly short of breath. Hypertensive. Positive JVD. Received a dose of lasix in the lab but unable to void well lying down. Lungs are tight.   Baseline EF 20-25% in June. EDP high at cath. With addition of contrast and stress of PCI I think he has exacerbation of CHF. Foley cathter placed- good output. Will give additional lasix 80 mg and start IV Ntg for preload and afterload reduction.   Given these findings will monitor overnight in ICU.   Paulena Servais [name] MD, [facility]

## 2020-12-27 ENCOUNTER — Other Ambulatory Visit (HOSPITAL_COMMUNITY): Payer: Self-pay

## 2020-12-27 ENCOUNTER — Ambulatory Visit (HOSPITAL_COMMUNITY): Payer: Medicare HMO

## 2020-12-27 ENCOUNTER — Encounter (HOSPITAL_COMMUNITY): Payer: Self-pay | Admitting: Cardiology

## 2020-12-27 DIAGNOSIS — I5021 Acute systolic (congestive) heart failure: Secondary | ICD-10-CM

## 2020-12-27 DIAGNOSIS — I517 Cardiomegaly: Secondary | ICD-10-CM | POA: Diagnosis not present

## 2020-12-27 DIAGNOSIS — I169 Hypertensive crisis, unspecified: Secondary | ICD-10-CM

## 2020-12-27 DIAGNOSIS — I200 Unstable angina: Secondary | ICD-10-CM

## 2020-12-27 DIAGNOSIS — R0602 Shortness of breath: Secondary | ICD-10-CM | POA: Diagnosis not present

## 2020-12-27 LAB — BASIC METABOLIC PANEL
Anion gap: 14 (ref 5–15)
BUN: 24 mg/dL — ABNORMAL HIGH (ref 8–23)
CO2: 24 mmol/L (ref 22–32)
Calcium: 9.3 mg/dL (ref 8.9–10.3)
Chloride: 100 mmol/L (ref 98–111)
Creatinine, Ser: 1.4 mg/dL — ABNORMAL HIGH (ref 0.61–1.24)
GFR, Estimated: 52 mL/min — ABNORMAL LOW (ref >60)
Glucose, Bld: 116 mg/dL — ABNORMAL HIGH (ref 70–99)
Potassium: 3.6 mmol/L (ref 3.5–5.1)
Sodium: 138 mmol/L (ref 135–145)

## 2020-12-27 LAB — ECHOCARDIOGRAM LIMITED
AR max vel: 2.87 cm2
AV Area VTI: 2.79 cm2
AV Area mean vel: 2.95 cm2
AV Mean grad: 5 mmHg
AV Peak grad: 8.5 mmHg
Ao pk vel: 1.46 m/s
Area-P 1/2: 2.4 cm2
Calc EF: 30.6 %
Height: 69 in
Single Plane A2C EF: 42.5 %
Single Plane A4C EF: 18.7 %
Weight: 3344 oz

## 2020-12-27 LAB — CBC
HCT: 35.1 % — ABNORMAL LOW (ref 39.0–52.0)
Hemoglobin: 11.8 g/dL — ABNORMAL LOW (ref 13.0–17.0)
MCH: 30.2 pg (ref 26.0–34.0)
MCHC: 33.6 g/dL (ref 30.0–36.0)
MCV: 89.8 fL (ref 80.0–100.0)
Platelets: 291 10*3/uL (ref 150–400)
RBC: 3.91 MIL/uL — ABNORMAL LOW (ref 4.22–5.81)
RDW: 13.4 % (ref 11.5–15.5)
WBC: 9.5 10*3/uL (ref 4.0–10.5)
nRBC: 0.4 % — ABNORMAL HIGH (ref 0.0–0.2)

## 2020-12-27 LAB — BRAIN NATRIURETIC PEPTIDE: B Natriuretic Peptide: 1329.5 pg/mL — ABNORMAL HIGH (ref 0.0–100.0)

## 2020-12-27 LAB — POCT ACTIVATED CLOTTING TIME
Activated Clotting Time: 155 seconds
Activated Clotting Time: 149 seconds

## 2020-12-27 MED ORDER — CHLORHEXIDINE GLUCONATE CLOTH 2 % EX PADS
6.0000 | MEDICATED_PAD | Freq: Every day | CUTANEOUS | Status: DC
  Administered 2020-12-27 – 2020-12-28 (×2): 6 via TOPICAL

## 2020-12-27 MED ORDER — ATROPINE SULFATE 1 MG/10ML IJ SOSY
PREFILLED_SYRINGE | INTRAMUSCULAR | Status: AC
  Filled 2020-12-27: qty 10

## 2020-12-27 MED ORDER — PERFLUTREN LIPID MICROSPHERE
1.0000 mL | INTRAVENOUS | Status: AC | PRN
Start: 2020-12-27 — End: 2020-12-27
  Administered 2020-12-27: 2 mL via INTRAVENOUS
  Filled 2020-12-27: qty 10

## 2020-12-27 MED ORDER — LOSARTAN POTASSIUM 50 MG PO TABS: 50.0000 mg | ORAL_TABLET | Freq: Every day | ORAL | Status: DC

## 2020-12-27 MED ORDER — APIXABAN 5 MG PO TABS
5.0000 mg | ORAL_TABLET | Freq: Two times a day (BID) | ORAL | Status: DC
  Administered 2020-12-27 – 2020-12-30 (×6): 5 mg via ORAL
  Filled 2020-12-27 (×6): qty 1

## 2020-12-27 MED ORDER — HYDRALAZINE HCL 50 MG PO TABS: 100.0000 mg | ORAL_TABLET | Freq: Three times a day (TID) | ORAL | Status: DC

## 2020-12-27 MED ORDER — EMPAGLIFLOZIN 10 MG PO TABS
10.0000 mg | ORAL_TABLET | Freq: Every day | ORAL | Status: DC
  Administered 2020-12-27 – 2020-12-29 (×3): 10 mg via ORAL
  Filled 2020-12-27 (×3): qty 1

## 2020-12-27 MED ORDER — RAMIPRIL 5 MG PO CAPS
5.0000 mg | ORAL_CAPSULE | Freq: Every day | ORAL | Status: DC
  Administered 2020-12-28: 5 mg via ORAL
  Filled 2020-12-27: qty 1

## 2020-12-27 MED ORDER — POTASSIUM CHLORIDE CRYS ER 20 MEQ PO TBCR
40.0000 meq | EXTENDED_RELEASE_TABLET | Freq: Two times a day (BID) | ORAL | Status: DC
Start: 2020-12-27 — End: 2020-12-29
  Administered 2020-12-27 – 2020-12-29 (×5): 40 meq via ORAL
  Filled 2020-12-27 (×5): qty 2

## 2020-12-27 MED ORDER — SPIRONOLACTONE 25 MG PO TABS
25.0000 mg | ORAL_TABLET | Freq: Every day | ORAL | Status: DC
  Administered 2020-12-27 – 2020-12-28 (×2): 25 mg via ORAL
  Filled 2020-12-27 (×2): qty 1

## 2020-12-27 MED ORDER — FUROSEMIDE 10 MG/ML IJ SOLN
80.0000 mg | Freq: Two times a day (BID) | INTRAMUSCULAR | Status: DC
  Administered 2020-12-27 (×2): 80 mg via INTRAVENOUS
  Administered 2020-12-28: 40 mg via INTRAVENOUS
  Filled 2020-12-27 (×3): qty 8

## 2020-12-27 MED ORDER — OXYCODONE HCL 5 MG PO TABS: 5.0000 mg | ORAL_TABLET | Freq: Four times a day (QID) | ORAL | Status: DC | PRN

## 2020-12-27 NOTE — Progress Notes (Addendum)
Cardiology Progress Note  Patient ID: [name] MRN: [id number] DOB: 05-05-1945 Date of Encounter: 12/28/2020  Primary Cardiologist: Peter [name], MD  Subjective   Chief Complaint: SOB  HPI: Admitted with hypertensive crisis and pulmonary edema.  Left heart catheterization with PCI of the vein graft to the RPDA.  LVEDP 30.    Net negative 1.7L overnight.  Cr 1.4>1.7. Wt 209>199  ROS:  All other ROS reviewed and negative. Pertinent positives noted in the HPI.     Inpatient Medications  Scheduled Meds:  apixaban  5 mg Oral BID   aspirin  81 mg Oral Daily   atorvastatin  80 mg Oral Daily   Chlorhexidine Gluconate Cloth  6 each Topical Daily   cholecalciferol  5,000 Units Oral Daily   clopidogrel  75 mg Oral Daily   empagliflozin  10 mg Oral Daily   finasteride  5 mg Oral Daily   [START ON 12/29/2020] furosemide  40 mg Oral Daily   isosorbide mononitrate  120 mg Oral Daily   metoprolol succinate  50 mg Oral Daily   oxybutynin  5 mg Oral Daily   pantoprazole  40 mg Oral Daily   potassium chloride  40 mEq Oral BID   ranolazine  500 mg Oral BID   sodium chloride flush  3 mL Intravenous Q12H   spironolactone  25 mg Oral Daily   Continuous Infusions:  sodium chloride Stopped (12/27/20 1409)   PRN Meds: sodium chloride, acetaminophen, nitroGLYCERIN, ondansetron (ZOFRAN) IV, oxyCODONE, sodium chloride flush   Vital Signs   Vitals:   12/28/20 0600 12/28/20 0700 12/28/20 0751 12/28/20 0800  BP: (!) 142/73 136/80  125/62  Pulse: 71 60  69  Resp: 17 15  (!) 23  Temp:   97.6 F (36.4 C)   TempSrc:   Oral   SpO2: 94% 91%  95%  Weight:    90.4 kg  Height:        Intake/Output Summary (Last 24 hours) at 12/28/2020 0940 Last data filed at 12/28/2020 0600 Gross per 24 hour  Intake 1052.77 ml  Output 3035 ml  Net -1982.23 ml   Last 3 Weights 12/28/2020 12/26/2020 12/24/2020  Weight (lbs) 199 lb 6.4 oz 209 lb 215 lb  Weight (kg) 90.447 kg 94.802 kg 97.523 kg       Telemetry  Tele: V-paced in 70s   ECG  No new ECG today   Physical Exam   Vitals:   12/28/20 0600 12/28/20 0700 12/28/20 0751 12/28/20 0800  BP: (!) 142/73 136/80  125/62  Pulse: 71 60  69  Resp: 17 15  (!) 23  Temp:   97.6 F (36.4 C)   TempSrc:   Oral   SpO2: 94% 91%  95%  Weight:    90.4 kg  Height:        Intake/Output Summary (Last 24 hours) at 12/28/2020 0940 Last data filed at 12/28/2020 0600 Gross per 24 hour  Intake 1052.77 ml  Output 3035 ml  Net -1982.23 ml    Last 3 Weights 12/28/2020 12/26/2020 12/24/2020  Weight (lbs) 199 lb 6.4 oz 209 lb 215 lb  Weight (kg) 90.447 kg 94.802 kg 97.523 kg    Body mass index is 29.45 kg/m.   General: Comfortable appearing, sitting up in the chair Head: Atraumatic, normal size  Eyes: PEERLA, EOMI  Neck: Supple, JVD 8cm Cardiac: Normal S1, S2; RRR; no murmurs, rubs, or gallops Lungs: Clear to auscultation bilaterally Abd: Soft, nontender, no hepatomegaly  Ext:  Trace LE edema in the ankles; warm Musculoskeletal: No deformities, BUE and BLE strength normal and equal Skin: Warm and dry, no rashes   Neuro: Alert and oriented to person, place, time, and situation, CNII-XII grossly intact, no focal deficits  Psych: Normal mood and affect   Labs  High Sensitivity Troponin:  No results for input(s): TROPONINIHS in the last 720 hours.   Cardiac EnzymesNo results for input(s): TROPONINI in the last 168 hours. No results for input(s): TROPIPOC in the last 168 hours.  Chemistry Recent Labs  Lab 12/24/20 1553 12/26/20 2027 12/27/20 0349 12/28/20 0328  NA 140  --  138 136  K 4.1 3.3* 3.6 3.9  CL 103  --  100 97*  CO2 20  --  24 28  GLUCOSE 109*  --  116* 109*  BUN 25  --  24* 31*  CREATININE 1.29*  --  1.40* 1.68*  CALCIUM 9.7  --  9.3 9.2  GFRNONAA  --   --  52* 42*  ANIONGAP  --   --  14 11    Hematology Recent Labs  Lab 12/24/20 1553 12/27/20 0349 12/28/20 0328  WBC 7.6 9.5 10.1  RBC 3.87* 3.91* 3.76*  HGB  11.6* 11.8* 11.3*  HCT 35.3* 35.1* 34.6*  MCV 91 89.8 92.0  MCH 30.0 30.2 30.1  MCHC 32.9 33.6 32.7  RDW 13.0 13.4 13.5  PLT 301 291 274   BNP Recent Labs  Lab 12/27/20 0349  BNP 1,329.5*    DDimer No results for input(s): DDIMER in the last 168 hours.   Radiology  CARDIAC CATHETERIZATION  Result Date: 12/27/2020   Ost LM to Dist LM lesion is 100% stenosed.   Ost Cx to Dist Cx lesion is 100% stenosed.   Dist LM to Mid LAD lesion is 100% stenosed.   Ost RCA to Prox RCA lesion is 100% stenosed.   Origin to Prox Graft lesion before 1st Mrg  is 100% stenosed.   Origin to Prox Graft lesion before RPDA  is 95% stenosed.   A drug-eluting stent was successfully placed using a STENT ONYX FRONTIER 4.0X22.   Post intervention, there is a 20% residual stenosis.   LIMA graft was visualized by angiography and is normal in caliber.   SVG graft was visualized by angiography.   SVG graft was not visualized.   The graft exhibits no disease.   LV end diastolic pressure is moderately elevated. Left main and RCA occlusive disease. The patient is graft dependent. Patent LIMA to the LAD Occluded SVG to OM1 and OM3. This is new since 2018. Some collaterals to OM3 Severe stenosis in sequential SVG to PDA and PL branches of the RCA. Also new since 2018. Moderately elevated LVEDP Successful PCI of the SVG to PDA and PL. Used distal protection and DES x 1. Lesion was quite resistant. Plan: will observe overnight. Patient was given Brilinta in the lab but I would transition to Plavix in am since he is also on anticoagulation and cost is a major issue. Would give ASA for one month and Plavix for 12 months. May resume Eliquis tomorrow if no bleeding. Will give IV lasix x 1. Need to update Echo- this can be done as outpatient. Will again need to assess CHF therapy. Entresto and SGLT 2 inhibitors have been too expensive. Could consider aldactone.   DG CHEST PORT 1 VIEW  Result Date: 12/27/2020 CLINICAL DATA:  Shortness of  breath EXAM: PORTABLE CHEST 1 VIEW COMPARISON:  Previous  studies including the examination of 05/02/2019 FINDINGS: Transverse diameter of heart is increased. There is prosthetic device in the region of the aortic annulus. Pacemaker/defibrillator battery is seen in the left infraclavicular region. Biventricular pacer leads are noted in place. Lung fields are clear of any infiltrates or pulmonary edema. There is no significant pleural effusion or pneumothorax. IMPRESSION: Cardiomegaly. There are no new infiltrates or signs of pulmonary edema. Electronically Signed   By: Elmer Picker M.D.   On: 12/27/2020 12:17   ECHOCARDIOGRAM LIMITED  Result Date: 12/27/2020    ECHOCARDIOGRAM LIMITED REPORT   Patient Name:   [name] Date of Exam: 12/27/2020 Medical Rec #:  [id number]        Height:       69.0 in Accession #:    [id number]       Weight:       209.0 lb Date of Birth:  01/21/1945         BSA:          2.105 m Patient Age:    36 years         BP:           100/53 mmHg Patient Gender: M                HR:           72 bpm. Exam Location:  Inpatient Procedure: Limited Echo, Cardiac Doppler, Color Doppler and Intracardiac            Opacification Agent Indications:    CHF-Acute Systolic R74.08  History:        Patient has prior history of Echocardiogram examinations. Acute                 on chronic systolic heart failure, EF 25-30%                 #Ischemic cardiomyopathy                 #Left bundle branch block status post CRT-D                 #CAD s/p CABG                 Paroxysmal Afib.                 Aortic Valve: 26 mm Sapien prosthetic, stented (TAVR) valve is                 present in the aortic position. Procedure Date: 02/18/2016.  Sonographer:    Darlina Sicilian RDCS Referring Phys: [id number] [place]  1. Left ventricular ejection fraction by 2D MOD biplane is 30.6 %. Left ventricular diastolic parameters are consistent with Grade I diastolic dysfunction (impaired  relaxation).  2. Right ventricular systolic function was not well visualized. There is normal pulmonary artery systolic pressure.  3. Mild paravalvular leak is present, mildy worsened from prior echo 06/25/2020. No dehiscence. AV peak velocity 1.4 m/s; peak gradient 8.5 mmHg. Aortic valve regurgitation is trivial. There is a 26 mm Sapien prosthetic (TAVR) valve present in the aortic  position. Procedure Date: 02/18/2016. FINDINGS  Left Ventricle: Left ventricular ejection fraction by 2D MOD biplane is 30.6 %. Abnormal (paradoxical) septal motion, consistent with left bundle branch block. Left ventricular diastolic parameters are consistent with Grade I diastolic dysfunction (impaired relaxation). Right Ventricle: Right ventricular systolic function was not well visualized. There is normal pulmonary artery systolic pressure. The  tricuspid regurgitant velocity is 1.64 m/s, and with an assumed right atrial pressure of 8 mmHg, the estimated right ventricular systolic pressure is 49.4 mmHg. Tricuspid Valve: Tricuspid valve regurgitation is not demonstrated. Aortic Valve: Mild paravalvular leak is present, mildy worsened from prior echo 06/25/2020. No dehiscence. AV peak velocity 1.4 m/s; peak gradient 8.5 mmHg. Aortic valve regurgitation is trivial. Aortic valve mean gradient measures 5.0 mmHg. Aortic valve peak gradient measures 8.5 mmHg. Aortic valve area, by VTI measures 2.79 cm. There is a 26 mm Sapien prosthetic, stented (TAVR) valve present in the aortic position. Procedure Date: 02/18/2016. Pulmonic Valve: Pulmonic valve regurgitation is not visualized. LEFT VENTRICLE PLAX 2D                        Biplane EF (MOD) LVOT diam:     2.50 cm         LV Biplane EF:   Left LV SV:         79                               ventricular LV SV Index:   38                               ejection LVOT Area:     4.91 cm                         fraction by                                                 2D MOD                                                  biplane is LV Volumes (MOD)                                30.6 %. LV vol d, MOD    240.0 ml A2C:                           Diastology LV vol d, MOD    219.0 ml      LV e' medial:    4.02 cm/s A4C:                           LV E/e' medial:  11.5 LV vol s, MOD    138.0 ml      LV e' lateral:   3.07 cm/s A2C:                           LV E/e' lateral: 15.1 LV vol s, MOD    178.0 ml A4C: LV SV MOD A2C:   102.0 ml LV SV MOD A4C:   219.0 ml LV SV MOD BP:    70.7 ml AORTIC VALVE AV Area (Vmax):    2.87 cm  AV Area (Vmean):   2.95 cm AV Area (VTI):     2.79 cm AV Vmax:           145.67 cm/s AV Vmean:          104.667 cm/s AV VTI:            0.284 m AV Peak Grad:      8.5 mmHg AV Mean Grad:      5.0 mmHg LVOT Vmax:         85.10 cm/s LVOT Vmean:        62.800 cm/s LVOT VTI:          0.161 m LVOT/AV VTI ratio: 0.57 MITRAL VALVE               TRICUSPID VALVE MV Area (PHT): 2.40 cm    TR Peak grad:   10.8 mmHg MV Decel Time: 316 msec    TR Vmax:        164.00 cm/s MV E velocity: 46.30 cm/s MV A velocity: 76.80 cm/s  SHUNTS MV E/A ratio:  0.60        Systemic VTI:  0.16 m                            Systemic Diam: 2.50 cm Phineas Inches Electronically signed by Phineas Inches Signature Date/Time: 12/27/2020/4:08:09 PM    Final     Cardiac Studies  LHC 12/26/2020 Left main and RCA occlusive disease. The patient is graft dependent. Patent LIMA to the LAD Occluded SVG to OM1 and OM3. This is new since 2018. Some collaterals to OM3 Severe stenosis in sequential SVG to PDA and PL branches of the RCA. Also new since 2018. Moderately elevated LVEDP Successful PCI of the SVG to PDA and PL. Used distal protection and DES x 1. Lesion was quite resistant.  TTE 12/27/20: IMPRESSIONS   1. Left ventricular ejection fraction by 2D MOD biplane is 30.6 %. Left  ventricular diastolic parameters are consistent with Grade I diastolic  dysfunction (impaired relaxation).   2. Right ventricular systolic function  was not well visualized. There is  normal pulmonary artery systolic pressure.   3. Mild paravalvular leak is present, mildy worsened from prior echo  06/25/2020. No dehiscence. AV peak velocity 1.4 m/s; peak gradient 8.5  mmHg. Aortic valve regurgitation is trivial. There is a 26 mm Sapien  prosthetic (TAVR) valve present in the aortic   position. Procedure Date: 02/18/2016.    TTE 06/25/2020  1. Left ventricular ejection fraction, by estimation, is 25 to 30%. The  left ventricle has severely decreased function. The left ventricle  demonstrates global hypokinesis. The left ventricular internal cavity size  was moderately dilated. There is  moderate left ventricular hypertrophy. Left ventricular diastolic  parameters are consistent with Grade II diastolic dysfunction  (pseudonormalization).   2. Right ventricular systolic function is moderately reduced. The right  ventricular size is mildly enlarged.   3. Left atrial size was severely dilated.   4. Right atrial size was moderately dilated.   5. The mitral valve is normal in structure. Mild to moderate mitral valve  regurgitation.   6. There is a small perivalvular leak.. The aortic valve has been  repaired/replaced. Aortic valve regurgitation is trivial.   Patient Profile  [name] is a 75 y.o. male with CAD status post CABG, systolic heart failure, EF 25-30%, left bundle branch block status post CRT-D, hypertension, hyperlipidemia who  was admitted on 17/40/8144 for acute systolic heart failure after left heart catheterization.  Assessment & Plan   #Acute on chronic systolic heart failure, EF 25-30% #Ischemic cardiomyopathy #Left bundle branch block status post CRT-D -Underwent left heart catheterization 12/15 for unstable angina.  Underwent PCI to the SVG to RPDA. -LVEDP 30 mmHg.  Admitted with hypertensive crisis. -TTE with LVEF 30%, G1DD, normal TAVR valve function (no significant change from prior) -Brisk response to IV  diuresis. Wt down 10lbs. -Got lasix 40mg  IV this AM; will transition to lasix 40mg  PO tomorrow -Holding ramipril today with plans to eventually transition to entresto -Continue metoprolol succinate 50 mg daily -Continue Aldactone 25 mg daily -Continue Imdur 120 mg daily for chronic angina.  -Continue hydralazine 100 mg 3 times daily. -Continue Jardiance 10 mg daily. -Off nitro gtt -He is BiV paced.  Per review of interrogation BiV paced 99% of the time.  QRS remains above 170 ms.  Discussed with EP.  May need lead optimization of the LV lead.  #CAD s/p CABG #PCI to SVG-RPDA #Unstable Angina -Status post PCI of vein graft RPDA.  He is graft dependent. -LIMA-LAD patent. LCX 100% with collateral flow, SVG-PDA patent  -Continue ASA/plavix -Continue imdur/ranexa  -Continue lipitor 80 mg daily   #Paroxysmal Afib -Resumed apxiaban 5mg  BID post-cath -Continue metop XL 50mg  daily  #CKDIIIa -Cr rising to 1.6 this AM; wt down 10lbs -Lasix 40mg  IV this AM -Plan to transition to PO tomorrow -Holding ramipril with plans to transition to entresto if able  #TAVR -02/18/2016 26 mm S3 -ASA 81 mg QD  -TTE 12/16 with mild PVL, mean gradient 8.22mmHg  FEN -No IVF -code: full -diet: heart healthy -dvt ppx: Eliquis  CRITICAL CARE TIME: I have spent a total of 30 minutes with patient reviewing hospital notes, telemetry, EKGs, labs and examining the patient as well as establishing an assessment and plan that was discussed with the patient.  > 50% of time was spent in direct patient care. The patient is critically ill with multi-organ system failure and requires high complexity decision making for assessment and support, frequent evaluation and titration of therapies, application of advanced monitoring technologies and extensive interpretation of multiple databases.   For questions or updates, please contact [place] Please consult [email or web address] for contact info under    Signed, Gwyndolyn Kaufman, MD [place]  12/28/2020 9:40 AM

## 2020-12-27 NOTE — Progress Notes (Signed)
Cardiology Progress Note  Patient ID: [name] MRN: [id number] DOB: Aug 21, 1945 Date of Encounter: 12/27/2020  Primary Cardiologist: Peter [name], MD  Subjective   Chief Complaint: SOB  HPI: Admitted with hypertensive crisis and pulmonary edema.  Left heart catheterization with PCI of the vein graft to the RPDA.  On nitro drip.  LVEDP 30.  Congestive heart failure appears to be a big issue here.  Diuresis well overnight.  Needs further diuresis.  ROS:  All other ROS reviewed and negative. Pertinent positives noted in the HPI.     Inpatient Medications  Scheduled Meds:  aspirin  81 mg Oral Daily   atorvastatin  80 mg Oral Daily   atropine       Chlorhexidine Gluconate Cloth  6 each Topical Daily   cholecalciferol  5,000 Units Oral Daily   [START ON 12/28/2020] clopidogrel  75 mg Oral Daily   finasteride  5 mg Oral Daily   furosemide  20 mg Oral Daily   isosorbide mononitrate  120 mg Oral Daily   metoprolol succinate  50 mg Oral Daily   oxybutynin  5 mg Oral Daily   pantoprazole  40 mg Oral Daily   ramipril  5 mg Oral Daily   ranolazine  500 mg Oral BID   sodium chloride flush  3 mL Intravenous Q12H   Continuous Infusions:  sodium chloride 10 mL/hr at 12/27/20 0600   nitroGLYCERIN 100 mcg/min (12/27/20 0600)   PRN Meds: sodium chloride, acetaminophen, nitroGLYCERIN, ondansetron (ZOFRAN) IV, sodium chloride flush   Vital Signs   Vitals:   12/27/20 0627 12/27/20 0630 12/27/20 0633 12/27/20 0636  BP: 137/69 108/72 121/71 116/71  Pulse: 71 70 68 73  Resp: (!) 24 (!) 26 17 (!) 32  Temp:      TempSrc:      SpO2: (!) 89% 90% 94% (!) 87%  Weight:      Height:        Intake/Output Summary (Last 24 hours) at 12/27/2020 0757 Last data filed at 12/27/2020 0600 Gross per 24 hour  Intake 185.54 ml  Output 4075 ml  Net -3889.46 ml   Last 3 Weights 12/26/2020 12/24/2020 11/13/2020  Weight (lbs) 209 lb 215 lb 214 lb 12.8 oz  Weight (kg) 94.802 kg 97.523 kg 97.433 kg       Telemetry  Overnight telemetry shows sinus rhythm in the 70s, V pacing, which I personally reviewed.   ECG  The most recent ECG shows V paced rhythm 90 bpm, which I personally reviewed.   Physical Exam   Vitals:   12/27/20 0627 12/27/20 0630 12/27/20 0633 12/27/20 0636  BP: 137/69 108/72 121/71 116/71  Pulse: 71 70 68 73  Resp: (!) 24 (!) 26 17 (!) 32  Temp:      TempSrc:      SpO2: (!) 89% 90% 94% (!) 87%  Weight:      Height:        Intake/Output Summary (Last 24 hours) at 12/27/2020 0757 Last data filed at 12/27/2020 0600 Gross per 24 hour  Intake 185.54 ml  Output 4075 ml  Net -3889.46 ml    Last 3 Weights 12/26/2020 12/24/2020 11/13/2020  Weight (lbs) 209 lb 215 lb 214 lb 12.8 oz  Weight (kg) 94.802 kg 97.523 kg 97.433 kg    Body mass index is 30.86 kg/m.   General: Ill-appearing Head: Atraumatic, normal size  Eyes: PEERLA, EOMI  Neck: Supple, JVD 15 to 17 cm of water Endocrine: No thryomegaly Cardiac:  Normal S1, S2; RRR; no murmurs, rubs, or gallops Lungs: Crackles at the lung bases Abd: Soft, nontender, no hepatomegaly  Ext: 2+ pitting edema Musculoskeletal: No deformities, BUE and BLE strength normal and equal Skin: Warm and dry, no rashes   Neuro: Alert and oriented to person, place, time, and situation, CNII-XII grossly intact, no focal deficits  Psych: Normal mood and affect   Labs  High Sensitivity Troponin:  No results for input(s): TROPONINIHS in the last 720 hours.   Cardiac EnzymesNo results for input(s): TROPONINI in the last 168 hours. No results for input(s): TROPIPOC in the last 168 hours.  Chemistry Recent Labs  Lab 12/24/20 1553 12/26/20 2027 12/27/20 0349  NA 140  --  138  K 4.1 3.3* 3.6  CL 103  --  100  CO2 20  --  24  GLUCOSE 109*  --  116*  BUN 25  --  24*  CREATININE 1.29*  --  1.40*  CALCIUM 9.7  --  9.3  GFRNONAA  --   --  52*  ANIONGAP  --   --  14    Hematology Recent Labs  Lab 12/24/20 1553 12/27/20 0349   WBC 7.6 9.5  RBC 3.87* 3.91*  HGB 11.6* 11.8*  HCT 35.3* 35.1*  MCV 91 89.8  MCH 30.0 30.2  MCHC 32.9 33.6  RDW 13.0 13.4  PLT 301 291   BNPNo results for input(s): BNP, PROBNP in the last 168 hours.  DDimer No results for input(s): DDIMER in the last 168 hours.   Radiology  CARDIAC CATHETERIZATION  Result Date: 12/27/2020   Ost LM to Dist LM lesion is 100% stenosed.   Ost Cx to Dist Cx lesion is 100% stenosed.   Dist LM to Mid LAD lesion is 100% stenosed.   Ost RCA to Prox RCA lesion is 100% stenosed.   Origin to Prox Graft lesion before 1st Mrg  is 100% stenosed.   Origin to Prox Graft lesion before RPDA  is 95% stenosed.   A drug-eluting stent was successfully placed using a STENT ONYX FRONTIER 4.0X22.   Post intervention, there is a 20% residual stenosis.   LIMA graft was visualized by angiography and is normal in caliber.   SVG graft was visualized by angiography.   SVG graft was not visualized.   The graft exhibits no disease.   LV end diastolic pressure is moderately elevated. Left main and RCA occlusive disease. The patient is graft dependent. Patent LIMA to the LAD Occluded SVG to OM1 and OM3. This is new since 2018. Some collaterals to OM3 Severe stenosis in sequential SVG to PDA and PL branches of the RCA. Also new since 2018. Moderately elevated LVEDP Successful PCI of the SVG to PDA and PL. Used distal protection and DES x 1. Lesion was quite resistant. Plan: will observe overnight. Patient was given Brilinta in the lab but I would transition to Plavix in am since he is also on anticoagulation and cost is a major issue. Would give ASA for one month and Plavix for 12 months. May resume Eliquis tomorrow if no bleeding. Will give IV lasix x 1. Need to update Echo- this can be done as outpatient. Will again need to assess CHF therapy. Entresto and SGLT 2 inhibitors have been too expensive. Could consider aldactone.    Cardiac Studies  LHC 12/26/2020 Left main and RCA occlusive  disease. The patient is graft dependent. Patent LIMA to the LAD Occluded SVG to OM1 and OM3. This  is new since 2018. Some collaterals to OM3 Severe stenosis in sequential SVG to PDA and PL branches of the RCA. Also new since 2018. Moderately elevated LVEDP Successful PCI of the SVG to PDA and PL. Used distal protection and DES x 1. Lesion was quite resistant.   TTE 06/25/2020  1. Left ventricular ejection fraction, by estimation, is 25 to 30%. The  left ventricle has severely decreased function. The left ventricle  demonstrates global hypokinesis. The left ventricular internal cavity size  was moderately dilated. There is  moderate left ventricular hypertrophy. Left ventricular diastolic  parameters are consistent with Grade II diastolic dysfunction  (pseudonormalization).   2. Right ventricular systolic function is moderately reduced. The right  ventricular size is mildly enlarged.   3. Left atrial size was severely dilated.   4. Right atrial size was moderately dilated.   5. The mitral valve is normal in structure. Mild to moderate mitral valve  regurgitation.   6. There is a small perivalvular leak.. The aortic valve has been  repaired/replaced. Aortic valve regurgitation is trivial.   Patient Profile  [name] is a 75 y.o. male with CAD status post CABG, systolic heart failure, EF 25-30%, left bundle branch block status post CRT-D, hypertension, hyperlipidemia who was admitted on 21/30/8657 for acute systolic heart failure after left heart catheterization.  Assessment & Plan   #Acute on chronic systolic heart failure, EF 25-30% #Ischemic cardiomyopathy #Left bundle branch block status post CRT-D -Underwent left heart catheterization yesterday for unstable angina.  Underwent PCI to the SVG to RPDA. -LVEDP 30 mmHg.  Admitted with hypertensive crisis on nitro drip. -Good response to Lasix but remains volume overloaded.  Continue 80 mg IV twice daily. -He has been unable to  afford Entresto in the past.  Transition to losartan tomorrow.  I have asked pharmacy to help with patient assistance.  I believe Delene Loll would be a good option for him.  If he cannot afford Entresto or not willing to would recommend transition to lisinopril 40 mg daily. -no signs of shock. Continue metoprolol succinate 50 mg daily.  -I have added Aldactone 25 mg daily. -He is on Imdur 120 mg daily for chronic angina.  On hydralazine 100 mg 3 times daily. -We will also add Jardiance 10 mg daily. -Remains on nitro drip.  He will remain in the ICU today.  We will get him off the nitro drip.  We will continue with aggressive diuresis.  Likely transition to floor tomorrow. -He is BiV paced.  Per review of interrogation BiV paced 99% of the time.  QRS remains above 170 ms.  Discussed with EP.  May need lead optimization of the LV lead. -repeat echo  #CAD s/p CABG #PCI to SVG-RPDA #Unstable Angina -Status post PCI of vein graft RPDA.  He is graft dependent. -LIMA-LAD patent. LCX 100% with collateral flow, SVG-PDA patent  -continue ASA/plavix -home imdur/ranexa  -continue lipitor 80 mg daily   #Paroxysmal Afib -Restart Eliquis later tonight once the sheath is out -SCDs for now until sheath out -in NSR  #CKDIIIa -close monitoring   #TAVR -02/18/2016 26 mm S3 -ASA 81 mg QD  -repeat echo but suspect normal  FEN -No IVF -code: full -diet: heart healthy -dvt ppx: scds. Eliquis likely tonight. Sheath not out yet   For questions or updates, please contact [place] Please consult [email or web address] for contact info under   CRITICAL CARE Performed by: [place] Bells T O'Neal  Total critical care time: 45 minutes. Critical  care time was exclusive of separately billable procedures and treating other patients. Critical care was necessary to treat or prevent imminent or life-threatening deterioration. Critical care was time spent personally by me on the following activities: development of treatment  plan with patient and/or surrogate as well as nursing, discussions with consultants, evaluation of patient's response to treatment, examination of patient, obtaining history from patient or surrogate, ordering and performing treatments and interventions, ordering and review of laboratory studies, ordering and review of radiographic studies, pulse oximetry and re-evaluation of patient's condition.    Signed, Addison Naegeli. Audie Box, MD, [place]  12/27/2020 7:57 AM

## 2020-12-27 NOTE — Progress Notes (Signed)
°  Echocardiogram 2D Echocardiogram has been performed.  [name] M 12/27/2020, 1:32 PM

## 2020-12-27 NOTE — Progress Notes (Signed)
Rt. Femoral sheath removed at 0855.  Held pressure at site.  Currently Level "0" with gauze pressure dressing at site.  Altha Harm, RN at bedside with site assessment.  Instructions given to patient.  Bedrest started at [place].

## 2020-12-27 NOTE — TOC Benefit Eligibility Note (Signed)
Patient Research scientist (life sciences) completed.    Pt is in coverage gap.   The patient is currently admitted and upon discharge could be taking ranolazine 500mg    The current 30 day co-pay is, $33.90 through [place]   The patient is currently admitted and upon discharge could be taking Entresto 24-26 mg.  The current 30 day co-pay is, $166.58 through a [facility] pharmacy  The patient is currently admitted and upon discharge could be taking Eliquis 5mg .   The current 30 day co-pay is, $137.93 through a [facility] pharmacy   The patient is currently admitted and upon discharge could be taking Jardiance 10mg .   The current 30 day co-pay is, $149.02 through a [facility] pharmacy

## 2020-12-28 DIAGNOSIS — Z87442 Personal history of urinary calculi: Secondary | ICD-10-CM | POA: Diagnosis not present

## 2020-12-28 DIAGNOSIS — G4733 Obstructive sleep apnea (adult) (pediatric): Secondary | ICD-10-CM | POA: Diagnosis present

## 2020-12-28 DIAGNOSIS — I25710 Atherosclerosis of autologous vein coronary artery bypass graft(s) with unstable angina pectoris: Secondary | ICD-10-CM | POA: Diagnosis present

## 2020-12-28 DIAGNOSIS — Z953 Presence of xenogenic heart valve: Secondary | ICD-10-CM | POA: Diagnosis not present

## 2020-12-28 DIAGNOSIS — I25110 Atherosclerotic heart disease of native coronary artery with unstable angina pectoris: Secondary | ICD-10-CM | POA: Diagnosis present

## 2020-12-28 DIAGNOSIS — I214 Non-ST elevation (NSTEMI) myocardial infarction: Secondary | ICD-10-CM | POA: Diagnosis present

## 2020-12-28 DIAGNOSIS — I200 Unstable angina: Secondary | ICD-10-CM | POA: Diagnosis not present

## 2020-12-28 DIAGNOSIS — I255 Ischemic cardiomyopathy: Secondary | ICD-10-CM | POA: Diagnosis present

## 2020-12-28 DIAGNOSIS — Z79899 Other long term (current) drug therapy: Secondary | ICD-10-CM | POA: Diagnosis not present

## 2020-12-28 DIAGNOSIS — Z7982 Long term (current) use of aspirin: Secondary | ICD-10-CM | POA: Diagnosis not present

## 2020-12-28 DIAGNOSIS — I447 Left bundle-branch block, unspecified: Secondary | ICD-10-CM | POA: Diagnosis present

## 2020-12-28 DIAGNOSIS — Z7901 Long term (current) use of anticoagulants: Secondary | ICD-10-CM | POA: Diagnosis not present

## 2020-12-28 DIAGNOSIS — I480 Paroxysmal atrial fibrillation: Secondary | ICD-10-CM | POA: Diagnosis present

## 2020-12-28 DIAGNOSIS — K2270 Barrett's esophagus without dysplasia: Secondary | ICD-10-CM | POA: Diagnosis present

## 2020-12-28 DIAGNOSIS — I169 Hypertensive crisis, unspecified: Secondary | ICD-10-CM

## 2020-12-28 DIAGNOSIS — E7800 Pure hypercholesterolemia, unspecified: Secondary | ICD-10-CM | POA: Diagnosis present

## 2020-12-28 DIAGNOSIS — I350 Nonrheumatic aortic (valve) stenosis: Secondary | ICD-10-CM | POA: Diagnosis present

## 2020-12-28 DIAGNOSIS — G629 Polyneuropathy, unspecified: Secondary | ICD-10-CM | POA: Diagnosis present

## 2020-12-28 DIAGNOSIS — N1831 Chronic kidney disease, stage 3a: Secondary | ICD-10-CM | POA: Diagnosis present

## 2020-12-28 DIAGNOSIS — I5023 Acute on chronic systolic (congestive) heart failure: Secondary | ICD-10-CM | POA: Diagnosis present

## 2020-12-28 DIAGNOSIS — I2582 Chronic total occlusion of coronary artery: Secondary | ICD-10-CM | POA: Diagnosis present

## 2020-12-28 DIAGNOSIS — Z7984 Long term (current) use of oral hypoglycemic drugs: Secondary | ICD-10-CM | POA: Diagnosis not present

## 2020-12-28 DIAGNOSIS — Z95810 Presence of automatic (implantable) cardiac defibrillator: Secondary | ICD-10-CM | POA: Diagnosis not present

## 2020-12-28 DIAGNOSIS — I5021 Acute systolic (congestive) heart failure: Secondary | ICD-10-CM | POA: Diagnosis not present

## 2020-12-28 DIAGNOSIS — Z6829 Body mass index (BMI) 29.0-29.9, adult: Secondary | ICD-10-CM | POA: Diagnosis not present

## 2020-12-28 DIAGNOSIS — I130 Hypertensive heart and chronic kidney disease with heart failure and stage 1 through stage 4 chronic kidney disease, or unspecified chronic kidney disease: Secondary | ICD-10-CM | POA: Diagnosis present

## 2020-12-28 DIAGNOSIS — K219 Gastro-esophageal reflux disease without esophagitis: Secondary | ICD-10-CM | POA: Diagnosis present

## 2020-12-28 DIAGNOSIS — E669 Obesity, unspecified: Secondary | ICD-10-CM | POA: Diagnosis present

## 2020-12-28 LAB — URINALYSIS, ROUTINE W REFLEX MICROSCOPIC
Glucose, UA: >=500 mg/dL — AB
Ketones, ur: NEGATIVE mg/dL
Nitrite: POSITIVE — AB
Protein, ur: 100 mg/dL — AB
Specific Gravity, Urine: 1.025 (ref 1.005–1.030)
pH: 5 (ref 5.0–8.0)

## 2020-12-28 LAB — URINALYSIS, MICROSCOPIC (REFLEX)
RBC / HPF: >50 RBC/hpf (ref 0–5)
WBC, UA: >50 WBC/hpf (ref 0–5)

## 2020-12-28 LAB — CBC
HCT: 34.6 % — ABNORMAL LOW (ref 39.0–52.0)
Hemoglobin: 11.3 g/dL — ABNORMAL LOW (ref 13.0–17.0)
MCH: 30.1 pg (ref 26.0–34.0)
MCHC: 32.7 g/dL (ref 30.0–36.0)
MCV: 92 fL (ref 80.0–100.0)
Platelets: 274 10*3/uL (ref 150–400)
RBC: 3.76 MIL/uL — ABNORMAL LOW (ref 4.22–5.81)
RDW: 13.5 % (ref 11.5–15.5)
WBC: 10.1 10*3/uL (ref 4.0–10.5)
nRBC: 0.3 % — ABNORMAL HIGH (ref 0.0–0.2)

## 2020-12-28 LAB — BASIC METABOLIC PANEL
Anion gap: 11 (ref 5–15)
BUN: 31 mg/dL — ABNORMAL HIGH (ref 8–23)
CO2: 28 mmol/L (ref 22–32)
Calcium: 9.2 mg/dL (ref 8.9–10.3)
Chloride: 97 mmol/L — ABNORMAL LOW (ref 98–111)
Creatinine, Ser: 1.68 mg/dL — ABNORMAL HIGH (ref 0.61–1.24)
GFR, Estimated: 42 mL/min — ABNORMAL LOW (ref >60)
Glucose, Bld: 109 mg/dL — ABNORMAL HIGH (ref 70–99)
Potassium: 3.9 mmol/L (ref 3.5–5.1)
Sodium: 136 mmol/L (ref 135–145)

## 2020-12-28 MED ORDER — FUROSEMIDE 40 MG PO TABS: 40.0000 mg | ORAL_TABLET | Freq: Every day | ORAL | Status: DC

## 2020-12-28 NOTE — Progress Notes (Signed)
CARDIAC REHAB PHASE I   Offered to walk with pt. Pt states ambulation with RN this morning. Notes some weakness, but denied CP or dizziness. Pt educated on importance of ASA, Plavix, and NTG. Pt given stent card and heart healthy diet. Reviewed site care, restrictions, and exercise guidelines. Will refer to CRP II [place] to meet the requirement, but pt not interested in attending. Hopeful for d/c tomorrow.  [id number] Rufina Falco, RN BSN 12/28/2020 12:59 PM

## 2020-12-28 NOTE — Progress Notes (Signed)
Cardiology Progress Note  Patient ID: [name] MRN: [id number] DOB: 1945/06/13 Date of Encounter: 12/29/2020  Primary Cardiologist: Peter [name], MD  Subjective   Chief Complaint: SOB  HPI: Admitted with hypertensive crisis and pulmonary edema.  Left heart catheterization with PCI of the vein graft to the RPDA.  LVEDP 30.    Cr rising from 1.7>1.9. UA concerning for UTI. Ucx pending.  Patient feels well this morning. Burning with urination improved. Asking when he can go home.   ROS:  All other ROS reviewed and negative. Pertinent positives noted in the HPI.     Inpatient Medications  Scheduled Meds:  apixaban  5 mg Oral BID   aspirin  81 mg Oral Daily   atorvastatin  80 mg Oral Daily   Chlorhexidine Gluconate Cloth  6 each Topical Daily   cholecalciferol  5,000 Units Oral Daily   clopidogrel  75 mg Oral Daily   empagliflozin  10 mg Oral Daily   finasteride  5 mg Oral Daily   furosemide  40 mg Oral Daily   isosorbide mononitrate  120 mg Oral Daily   metoprolol succinate  50 mg Oral Daily   oxybutynin  5 mg Oral Daily   pantoprazole  40 mg Oral Daily   potassium chloride  40 mEq Oral BID   ranolazine  500 mg Oral BID   sodium chloride flush  3 mL Intravenous Q12H   spironolactone  25 mg Oral Daily   Continuous Infusions:  sodium chloride Stopped (12/27/20 1409)   PRN Meds: sodium chloride, acetaminophen, nitroGLYCERIN, ondansetron (ZOFRAN) IV, oxyCODONE, sodium chloride flush   Vital Signs   Vitals:   12/28/20 1957 12/28/20 2318 12/29/20 0200 12/29/20 0413  BP: 119/70 126/65  128/72  Pulse: 70 66  70  Resp: 17 12  18   Temp: 98.1 F (36.7 C) 97.8 F (36.6 C)  (!) 97.4 F (36.3 C)  TempSrc: Oral Oral  Oral  SpO2: 98% 90%  97%  Weight:   90.4 kg   Height:   5\' 9"  (1.753 m)     Intake/Output Summary (Last 24 hours) at 12/29/2020 0555 Last data filed at 12/29/2020 0400 Gross per 24 hour  Intake 6 ml  Output 50 ml  Net -44 ml   Last 3 Weights  12/29/2020 12/28/2020 12/26/2020  Weight (lbs) 199 lb 6.4 oz 199 lb 6.4 oz 209 lb  Weight (kg) 90.447 kg 90.447 kg 94.802 kg      Telemetry  Tele: V-paced  ECG  No new tracing  Physical Exam   Vitals:   12/28/20 1957 12/28/20 2318 12/29/20 0200 12/29/20 0413  BP: 119/70 126/65  128/72  Pulse: 70 66  70  Resp: 17 12  18   Temp: 98.1 F (36.7 C) 97.8 F (36.6 C)  (!) 97.4 F (36.3 C)  TempSrc: Oral Oral  Oral  SpO2: 98% 90%  97%  Weight:   90.4 kg   Height:   5\' 9"  (1.753 m)     Intake/Output Summary (Last 24 hours) at 12/29/2020 0555 Last data filed at 12/29/2020 0400 Gross per 24 hour  Intake 6 ml  Output 50 ml  Net -44 ml    Last 3 Weights 12/29/2020 12/28/2020 12/26/2020  Weight (lbs) 199 lb 6.4 oz 199 lb 6.4 oz 209 lb  Weight (kg) 90.447 kg 90.447 kg 94.802 kg    Body mass index is 29.45 kg/m.   General: Comfortable, sitting up in a chair Head: Atraumatic, normal size  Eyes:  PEERLA, EOMI  Neck: Supple, normal JVD Cardiac: Normal S1, S2; RRR; no murmurs, rubs, or gallops Lungs: Clear to auscultation bilaterally Abd: Soft, nontender, no hepatomegaly  Ext: warm, no edema Musculoskeletal: No deformities, BUE and BLE strength normal and equal Skin: Warm and dry, no rashes   Neuro: Alert and oriented to person, place, time, and situation, CNII-XII grossly intact, no focal deficits  Psych: Normal mood and affect   Labs  High Sensitivity Troponin:  No results for input(s): TROPONINIHS in the last 720 hours.   Cardiac EnzymesNo results for input(s): TROPONINI in the last 168 hours. No results for input(s): TROPIPOC in the last 168 hours.  Chemistry Recent Labs  Lab 12/27/20 0349 12/28/20 0328 12/29/20 0019  NA 138 136 135  K 3.6 3.9 5.3*  CL 100 97* 100  CO2 24 28 25   GLUCOSE 116* 109* 112*  BUN 24* 31* 36*  CREATININE 1.40* 1.68* 1.90*  CALCIUM 9.3 9.2 9.5  GFRNONAA 52* 42* 36*  ANIONGAP 14 11 10     Hematology Recent Labs  Lab 12/27/20 0349  12/28/20 0328 12/29/20 0019  WBC 9.5 10.1 12.1*  RBC 3.91* 3.76* 3.92*  HGB 11.8* 11.3* 12.0*  HCT 35.1* 34.6* 36.2*  MCV 89.8 92.0 92.3  MCH 30.2 30.1 30.6  MCHC 33.6 32.7 33.1  RDW 13.4 13.5 13.7  PLT 291 274 291   BNP Recent Labs  Lab 12/27/20 0349  BNP 1,329.5*    DDimer No results for input(s): DDIMER in the last 168 hours.   Radiology  DG CHEST PORT 1 VIEW  Result Date: 12/27/2020 CLINICAL DATA:  Shortness of breath EXAM: PORTABLE CHEST 1 VIEW COMPARISON:  Previous studies including the examination of 05/02/2019 FINDINGS: Transverse diameter of heart is increased. There is prosthetic device in the region of the aortic annulus. Pacemaker/defibrillator battery is seen in the left infraclavicular region. Biventricular pacer leads are noted in place. Lung fields are clear of any infiltrates or pulmonary edema. There is no significant pleural effusion or pneumothorax. IMPRESSION: Cardiomegaly. There are no new infiltrates or signs of pulmonary edema. Electronically Signed   By: Elmer Picker M.D.   On: 12/27/2020 12:17   ECHOCARDIOGRAM LIMITED  Result Date: 12/27/2020    ECHOCARDIOGRAM LIMITED REPORT   Patient Name:   [name] Date of Exam: 12/27/2020 Medical Rec #:  [id number]        Height:       69.0 in Accession #:    [id number]       Weight:       209.0 lb Date of Birth:  01-20-1945         BSA:          2.105 m Patient Age:    75 years         BP:           100/53 mmHg Patient Gender: M                HR:           72 bpm. Exam Location:  Inpatient Procedure: Limited Echo, Cardiac Doppler, Color Doppler and Intracardiac            Opacification Agent Indications:    CHF-Acute Systolic Y30.16  History:        Patient has prior history of Echocardiogram examinations. Acute                 on chronic systolic heart failure, EF 25-30%                 #  Ischemic cardiomyopathy                 #Left bundle branch block status post CRT-D                 #CAD s/p CABG                  Paroxysmal Afib.                 Aortic Valve: 26 mm Sapien prosthetic, stented (TAVR) valve is                 present in the aortic position. Procedure Date: 02/18/2016.  Sonographer:    Darlina Sicilian RDCS Referring Phys: [id number] [place]  1. Left ventricular ejection fraction by 2D MOD biplane is 30.6 %. Left ventricular diastolic parameters are consistent with Grade I diastolic dysfunction (impaired relaxation).  2. Right ventricular systolic function was not well visualized. There is normal pulmonary artery systolic pressure.  3. Mild paravalvular leak is present, mildy worsened from prior echo 06/25/2020. No dehiscence. AV peak velocity 1.4 m/s; peak gradient 8.5 mmHg. Aortic valve regurgitation is trivial. There is a 26 mm Sapien prosthetic (TAVR) valve present in the aortic  position. Procedure Date: 02/18/2016. FINDINGS  Left Ventricle: Left ventricular ejection fraction by 2D MOD biplane is 30.6 %. Abnormal (paradoxical) septal motion, consistent with left bundle branch block. Left ventricular diastolic parameters are consistent with Grade I diastolic dysfunction (impaired relaxation). Right Ventricle: Right ventricular systolic function was not well visualized. There is normal pulmonary artery systolic pressure. The tricuspid regurgitant velocity is 1.64 m/s, and with an assumed right atrial pressure of 8 mmHg, the estimated right ventricular systolic pressure is 29.9 mmHg. Tricuspid Valve: Tricuspid valve regurgitation is not demonstrated. Aortic Valve: Mild paravalvular leak is present, mildy worsened from prior echo 06/25/2020. No dehiscence. AV peak velocity 1.4 m/s; peak gradient 8.5 mmHg. Aortic valve regurgitation is trivial. Aortic valve mean gradient measures 5.0 mmHg. Aortic valve peak gradient measures 8.5 mmHg. Aortic valve area, by VTI measures 2.79 cm. There is a 26 mm Sapien prosthetic, stented (TAVR) valve present in the aortic position. Procedure Date:  02/18/2016. Pulmonic Valve: Pulmonic valve regurgitation is not visualized. LEFT VENTRICLE PLAX 2D                        Biplane EF (MOD) LVOT diam:     2.50 cm         LV Biplane EF:   Left LV SV:         79                               ventricular LV SV Index:   38                               ejection LVOT Area:     4.91 cm                         fraction by                                                 2D MOD  biplane is LV Volumes (MOD)                                30.6 %. LV vol d, MOD    240.0 ml A2C:                           Diastology LV vol d, MOD    219.0 ml      LV e' medial:    4.02 cm/s A4C:                           LV E/e' medial:  11.5 LV vol s, MOD    138.0 ml      LV e' lateral:   3.07 cm/s A2C:                           LV E/e' lateral: 15.1 LV vol s, MOD    178.0 ml A4C: LV SV MOD A2C:   102.0 ml LV SV MOD A4C:   219.0 ml LV SV MOD BP:    70.7 ml AORTIC VALVE AV Area (Vmax):    2.87 cm AV Area (Vmean):   2.95 cm AV Area (VTI):     2.79 cm AV Vmax:           145.67 cm/s AV Vmean:          104.667 cm/s AV VTI:            0.284 m AV Peak Grad:      8.5 mmHg AV Mean Grad:      5.0 mmHg LVOT Vmax:         85.10 cm/s LVOT Vmean:        62.800 cm/s LVOT VTI:          0.161 m LVOT/AV VTI ratio: 0.57 MITRAL VALVE               TRICUSPID VALVE MV Area (PHT): 2.40 cm    TR Peak grad:   10.8 mmHg MV Decel Time: 316 msec    TR Vmax:        164.00 cm/s MV E velocity: 46.30 cm/s MV A velocity: 76.80 cm/s  SHUNTS MV E/A ratio:  0.60        Systemic VTI:  0.16 m                            Systemic Diam: 2.50 cm Phineas Inches Electronically signed by Phineas Inches Signature Date/Time: 12/27/2020/4:08:09 PM    Final     Cardiac Studies  LHC 12/26/2020 Left main and RCA occlusive disease. The patient is graft dependent. Patent LIMA to the LAD Occluded SVG to OM1 and OM3. This is new since 2018. Some collaterals to OM3 Severe stenosis in sequential SVG  to PDA and PL branches of the RCA. Also new since 2018. Moderately elevated LVEDP Successful PCI of the SVG to PDA and PL. Used distal protection and DES x 1. Lesion was quite resistant.  TTE 12/27/20: IMPRESSIONS   1. Left ventricular ejection fraction by 2D MOD biplane is 30.6 %. Left  ventricular diastolic parameters are consistent with Grade I diastolic  dysfunction (impaired relaxation).   2. Right ventricular systolic function was not well visualized. There is  normal  pulmonary artery systolic pressure.   3. Mild paravalvular leak is present, mildy worsened from prior echo  06/25/2020. No dehiscence. AV peak velocity 1.4 m/s; peak gradient 8.5  mmHg. Aortic valve regurgitation is trivial. There is a 26 mm Sapien  prosthetic (TAVR) valve present in the aortic   position. Procedure Date: 02/18/2016.    TTE 06/25/2020  1. Left ventricular ejection fraction, by estimation, is 25 to 30%. The  left ventricle has severely decreased function. The left ventricle  demonstrates global hypokinesis. The left ventricular internal cavity size  was moderately dilated. There is  moderate left ventricular hypertrophy. Left ventricular diastolic  parameters are consistent with Grade II diastolic dysfunction  (pseudonormalization).   2. Right ventricular systolic function is moderately reduced. The right  ventricular size is mildly enlarged.   3. Left atrial size was severely dilated.   4. Right atrial size was moderately dilated.   5. The mitral valve is normal in structure. Mild to moderate mitral valve  regurgitation.   6. There is a small perivalvular leak.. The aortic valve has been  repaired/replaced. Aortic valve regurgitation is trivial.   Patient Profile  [name] is a 75 y.o. male with CAD status post CABG, systolic heart failure, EF 25-30%, left bundle branch block status post CRT-D, hypertension, hyperlipidemia who was admitted on 35/57/3220 for acute systolic heart failure  after left heart catheterization.  Assessment & Plan   #Acute on chronic systolic heart failure, EF 25-30% #Ischemic cardiomyopathy #Left bundle branch block status post CRT-D -Underwent left heart catheterization 12/15 for unstable angina.  Underwent PCI to the SVG to RPDA. -LVEDP 30 mmHg.  Admitted with hypertensive crisis. -TTE with LVEF 30%, G1DD, normal TAVR valve function (no significant change from prior) -Brisk response to IV diuresis. Wt down 10lbs. -Hold lasix today given rising Cr -Holding ramipril  (last dose 12/16) with plans to eventually transition to entresto -Continue metoprolol succinate 50 mg daily -Hold spiro given rising Cr -Continue Imdur 120 mg daily for chronic angina.  -Continue hydralazine 100 mg 3 times daily. -Continue Jardiance 10 mg daily. -Off nitro gtt -He is BiV paced.  Per review of interrogation BiV paced 99% of the time.  QRS remains above 170 ms.  Discussed with EP.  May need lead optimization of the LV lead.  #CAD s/p CABG #PCI to SVG-RPDA #Unstable Angina -Status post PCI of vein graft RPDA.  He is graft dependent. -LIMA-LAD patent. LCX 100% with collateral flow, SVG-PDA patent  -Continue ASA/plavix -Continue imdur/ranexa  -Continue lipitor 80 mg daily   #Suspected UTI: -Patient complained of dysuria yesterday with UA concerning for UTI -Check urine culture -Foley pulled -Start empiric CTX pending culture data  #Paroxysmal Afib -Continue apxiaban 5mg  BID post-cath -Continue metop XL 50mg  daily  #CKDIIIa -Cr rising to 1.9 this AM -Hold lasix -Hold spiro and ACE pending improvement in renal function; goal to ultimately be on entresto if able to afford  #TAVR -02/18/2016 26 mm S3 -ASA 81 mg QD  -TTE 12/16 with mild PVL, mean gradient 8.41mmHg  FEN -No IVF -code: full -diet: heart healthy -dvt ppx: Eliquis   For questions or updates, please contact [facility] Please consult [email or web address] for contact info under     Signed, Gwyndolyn Kaufman, [place]  12/29/2020 5:55 AM

## 2020-12-29 LAB — BASIC METABOLIC PANEL
Anion gap: 10 (ref 5–15)
BUN: 36 mg/dL — ABNORMAL HIGH (ref 8–23)
CO2: 25 mmol/L (ref 22–32)
Calcium: 9.5 mg/dL (ref 8.9–10.3)
Chloride: 100 mmol/L (ref 98–111)
Creatinine, Ser: 1.9 mg/dL — ABNORMAL HIGH (ref 0.61–1.24)
GFR, Estimated: 36 mL/min — ABNORMAL LOW (ref >60)
Glucose, Bld: 112 mg/dL — ABNORMAL HIGH (ref 70–99)
Potassium: 5.3 mmol/L — ABNORMAL HIGH (ref 3.5–5.1)
Sodium: 135 mmol/L (ref 135–145)

## 2020-12-29 LAB — CBC
HCT: 36.2 % — ABNORMAL LOW (ref 39.0–52.0)
Hemoglobin: 12 g/dL — ABNORMAL LOW (ref 13.0–17.0)
MCH: 30.6 pg (ref 26.0–34.0)
MCHC: 33.1 g/dL (ref 30.0–36.0)
MCV: 92.3 fL (ref 80.0–100.0)
Platelets: 291 10*3/uL (ref 150–400)
RBC: 3.92 MIL/uL — ABNORMAL LOW (ref 4.22–5.81)
RDW: 13.7 % (ref 11.5–15.5)
WBC: 12.1 10*3/uL — ABNORMAL HIGH (ref 4.0–10.5)
nRBC: 0.2 % (ref 0.0–0.2)

## 2020-12-29 MED ORDER — SODIUM CHLORIDE 0.9 % IV SOLN
1.0000 g | INTRAVENOUS | Status: DC
  Administered 2020-12-29 – 2020-12-30 (×2): 1 g via INTRAVENOUS
  Filled 2020-12-29 (×2): qty 10

## 2020-12-29 NOTE — Plan of Care (Signed)

## 2020-12-30 ENCOUNTER — Other Ambulatory Visit: Payer: Self-pay | Admitting: Physician Assistant

## 2020-12-30 ENCOUNTER — Ambulatory Visit: Payer: Medicare HMO | Admitting: Cardiology

## 2020-12-30 DIAGNOSIS — N179 Acute kidney failure, unspecified: Secondary | ICD-10-CM

## 2020-12-30 LAB — CBC
HCT: 34.4 % — ABNORMAL LOW (ref 39.0–52.0)
Hemoglobin: 11 g/dL — ABNORMAL LOW (ref 13.0–17.0)
MCH: 29.9 pg (ref 26.0–34.0)
MCHC: 32 g/dL (ref 30.0–36.0)
MCV: 93.5 fL (ref 80.0–100.0)
Platelets: 275 10*3/uL (ref 150–400)
RBC: 3.68 MIL/uL — ABNORMAL LOW (ref 4.22–5.81)
RDW: 13.6 % (ref 11.5–15.5)
WBC: 9.5 10*3/uL (ref 4.0–10.5)
nRBC: 0 % (ref 0.0–0.2)

## 2020-12-30 LAB — URINE CULTURE: Culture: NO GROWTH

## 2020-12-30 LAB — BASIC METABOLIC PANEL
Anion gap: 7 (ref 5–15)
BUN: 34 mg/dL — ABNORMAL HIGH (ref 8–23)
CO2: 24 mmol/L (ref 22–32)
Calcium: 9.2 mg/dL (ref 8.9–10.3)
Chloride: 104 mmol/L (ref 98–111)
Creatinine, Ser: 1.59 mg/dL — ABNORMAL HIGH (ref 0.61–1.24)
GFR, Estimated: 44 mL/min — ABNORMAL LOW (ref >60)
Glucose, Bld: 109 mg/dL — ABNORMAL HIGH (ref 70–99)
Potassium: 4.3 mmol/L (ref 3.5–5.1)
Sodium: 135 mmol/L (ref 135–145)

## 2020-12-30 MED ORDER — FUROSEMIDE 20 MG PO TABS
20.0000 mg | ORAL_TABLET | Freq: Every day | ORAL | 3 refills | Status: DC | PRN
Start: 2020-12-30 — End: 2021-06-23

## 2020-12-30 MED ORDER — CLOPIDOGREL BISULFATE 75 MG PO TABS: 75.0000 mg | ORAL_TABLET | Freq: Every day | ORAL | 3 refills | Status: DC

## 2020-12-30 MED ORDER — ASPIRIN 81 MG PO CHEW: 81.0000 mg | CHEWABLE_TABLET | Freq: Every day | ORAL | Status: DC

## 2020-12-30 MED ORDER — CEPHALEXIN 250 MG PO CAPS: 250.0000 mg | ORAL_CAPSULE | Freq: Four times a day (QID) | ORAL | 0 refills | Status: AC

## 2020-12-30 NOTE — Progress Notes (Signed)
Progress Note  Patient Name: [name] Date of Encounter: 12/30/2020  Primary Cardiologist: Peter [name], MD   Subjective   Patient seen and examined at his bedside.  He is awake in bed offer no complaints at this time.  He is looking forward to going home.  Inpatient Medications    Scheduled Meds:  apixaban  5 mg Oral BID   aspirin  81 mg Oral Daily   atorvastatin  80 mg Oral Daily   Chlorhexidine Gluconate Cloth  6 each Topical Daily   cholecalciferol  5,000 Units Oral Daily   clopidogrel  75 mg Oral Daily   finasteride  5 mg Oral Daily   isosorbide mononitrate  120 mg Oral Daily   metoprolol succinate  50 mg Oral Daily   oxybutynin  5 mg Oral Daily   pantoprazole  40 mg Oral Daily   ranolazine  500 mg Oral BID   sodium chloride flush  3 mL Intravenous Q12H   Continuous Infusions:  sodium chloride Stopped (12/27/20 1409)   cefTRIAXone (ROCEPHIN)  IV 1 g (12/30/20 0645)   PRN Meds: sodium chloride, acetaminophen, nitroGLYCERIN, ondansetron (ZOFRAN) IV, oxyCODONE, sodium chloride flush   Vital Signs    Vitals:   12/29/20 2017 12/29/20 2323 12/30/20 0503 12/30/20 0747  BP: (!) 143/73 (!) 150/75 139/85 131/63  Pulse: 76 78 67 67  Resp: 20 20 20 18   Temp: 98.8 F (37.1 C) 97.8 F (36.6 C) 97.9 F (36.6 C) 97.9 F (36.6 C)  TempSrc: Oral Oral Oral   SpO2: 98% 99% 95% 93%  Weight:      Height:        Intake/Output Summary (Last 24 hours) at 12/30/2020 1023 Last data filed at 12/30/2020 0500 Gross per 24 hour  Intake 746 ml  Output --  Net 746 ml   Filed Weights   12/26/20 1131 12/28/20 0800 12/29/20 0200  Weight: 94.8 kg 90.4 kg 90.4 kg    Telemetry    V pacing- Personally Reviewed  ECG    None today- Personally Reviewed  Physical Exam   GEN: No acute distress.   Neck: No JVD Cardiac: RRR, no murmurs, rubs, or gallops.  Respiratory: Clear to auscultation bilaterally. GI: Soft, nontender, non-distended  MS: No edema; No  deformity. Neuro:  Nonfocal  Psych: Normal affect   Labs    Chemistry Recent Labs  Lab 12/28/20 0328 12/29/20 0019 12/30/20 0101  NA 136 135 135  K 3.9 5.3* 4.3  CL 97* 100 104  CO2 28 25 24   GLUCOSE 109* 112* 109*  BUN 31* 36* 34*  CREATININE 1.68* 1.90* 1.59*  CALCIUM 9.2 9.5 9.2  GFRNONAA 42* 36* 44*  ANIONGAP 11 10 7      Hematology Recent Labs  Lab 12/28/20 0328 12/29/20 0019 12/30/20 0101  WBC 10.1 12.1* 9.5  RBC 3.76* 3.92* 3.68*  HGB 11.3* 12.0* 11.0*  HCT 34.6* 36.2* 34.4*  MCV 92.0 92.3 93.5  MCH 30.1 30.6 29.9  MCHC 32.7 33.1 32.0  RDW 13.5 13.7 13.6  PLT 274 291 275    Cardiac EnzymesNo results for input(s): TROPONINI in the last 168 hours. No results for input(s): TROPIPOC in the last 168 hours.   BNP Recent Labs  Lab 12/27/20 0349  BNP 1,329.5*     DDimer No results for input(s): DDIMER in the last 168 hours.   Radiology    No results found.  Cardiac Studies   LHC 12/26/2020 Left main and RCA occlusive disease.  The patient is graft dependent. Patent LIMA to the LAD Occluded SVG to OM1 and OM3. This is new since 2018. Some collaterals to OM3 Severe stenosis in sequential SVG to PDA and PL branches of the RCA. Also new since 2018. Moderately elevated LVEDP Successful PCI of the SVG to PDA and PL. Used distal protection and DES x 1. Lesion was quite resistant.   TTE 12/27/20: IMPRESSIONS   1. Left ventricular ejection fraction by 2D MOD biplane is 30.6 %. Left  ventricular diastolic parameters are consistent with Grade I diastolic  dysfunction (impaired relaxation).   2. Right ventricular systolic function was not well visualized. There is  normal pulmonary artery systolic pressure.   3. Mild paravalvular leak is present, mildy worsened from prior echo  06/25/2020. No dehiscence. AV peak velocity 1.4 m/s; peak gradient 8.5  mmHg. Aortic valve regurgitation is trivial. There is a 26 mm Sapien  prosthetic (TAVR) valve present in the  aortic   position. Procedure Date: 02/18/2016.     TTE 06/25/2020  1. Left ventricular ejection fraction, by estimation, is 25 to 30%. The  left ventricle has severely decreased function. The left ventricle  demonstrates global hypokinesis. The left ventricular internal cavity size  was moderately dilated. There is  moderate left ventricular hypertrophy. Left ventricular diastolic  parameters are consistent with Grade II diastolic dysfunction  (pseudonormalization).   2. Right ventricular systolic function is moderately reduced. The right  ventricular size is mildly enlarged.   3. Left atrial size was severely dilated.   4. Right atrial size was moderately dilated.   5. The mitral valve is normal in structure. Mild to moderate mitral valve  regurgitation.   6. There is a small perivalvular leak.. The aortic valve has been  repaired/replaced. Aortic valve regurgitation is trivial  Patient Profile     75 y.o. male coronary artery disease status post CABG, systolic heart failure EF 25 to 30%, left bundle branch block, status post CRT-D, hypertension, chronic kidney disease, hyperlipidemia recently admitted on December 27, 2018 underwent left heart catheterization\and has been treated for acute exacerbation of heart failure.   Assessment & Plan    Acute on chronic systolic heart failure, EF 25 to 30% Ischemic cardiomyopathy status post cardiac resynchronization therapy CRT-D Coronary artery disease status post CABG with recent PCI to the SVG-RPDA Recent unstable angina resolved Suspected UTI-treated with antibiotics Paroxysmal atrial fibrillation Chronic kidney disease stage IIIa Baseline creatinine 1.5-1.6 Status post TAVR  Clinically he appears to be improved at his baseline.  He started for recent left heart catheterization due to his unstable angina with PCI to the SVG graft.  Initially there was also hypertensive crisis which has improved. For now continue patient on his Imdur 120  mg for his chronic angina, metoprolol succinate 50 mg daily, hydralazine 100 mg 3 times a day and Jardiance 10 mg daily. I would recommend holding his spironolactone until his follow-up with his primary cardiologist and repeated blood work. He continues to be BiV paced 99% of the time. Continue his rate control agents as well as his anticoagulation 5 mg twice daily of Eliquis for his atrial fibrillation. He had previously been placed on Entresto but he was unable to afford this medication.  He went back to taking ramipril.  This has been stopped for a few days due to his kidney function.  I would suggest starting the patient on low-dose valsartan at 40 mg daily for easier transition to [facility].  Continue to hold his  Aldactone until he follows in the outpatient setting. He will need blood work in 1 week. The patient  should follow-up with Dr. Ofilia Neas in 2 weeks.  Okay to discharge home.  For questions or updates, please contact [facility] Please consult [email or web address] for contact info under Cardiology/STEMI.      Signed, Berniece Salines, DO  12/30/2020, 10:23 AM

## 2020-12-30 NOTE — Progress Notes (Signed)
CARDIAC REHAB PHASE I   Pt denies questions or concerns. Anxious for d/c. Referred to CRP II [place] to meet the requirement.  Rufina Falco, RN BSN 12/30/2020 1:02 PM

## 2020-12-30 NOTE — Discharge Instructions (Signed)
No lifting over 5 lbs for 1 week. No sexual activity for 1 week. Keep procedure site clean & dry. If you notice increased pain, swelling, bleeding or pus, call/return!  You may shower, but no soaking baths/hot tubs/pools for 1 week °

## 2020-12-30 NOTE — Progress Notes (Signed)
°  Transition of Care [facility]) Screening Note   Patient Details  Name: [name] Date of Birth: 1945-08-01   Transition of Care [facility]) CM/SW Contact:    Bartholomew Crews, RN Phone Number: [phone] 12/30/2020, 12:39 PM    Transition of Care Department [facility]) has reviewed patient and no TOC needs have been identified at this time. We will continue to monitor patient advancement through interdisciplinary progression rounds. If new patient transition needs arise, please place a TOC consult.

## 2020-12-30 NOTE — Discharge Summary (Signed)
Discharge Summary    Patient ID: [name] MRN: [id number]; DOB: 15-Mar-1945  Admit date: 12/26/2020 Discharge date: 12/30/2020  PCP:  Janeann Merl, MD   [facility] Providers Cardiologist:  Peter [name], MD        Discharge Diagnoses    Principal Problem:   Unstable angina [facility]) Active Problems:   Hypercholesterolemia   LBBB (left bundle branch block)   Aortic stenosis   Chronic systolic CHF (congestive heart failure) (HCC)   S/P CABG x 7   S/P TAVR (transcatheter aortic valve replacement)   Biventricular ICD (implantable cardioverter-defibrillator) in place   Acute systolic heart failure ([place])   Hypertensive crisis   NSTEMI (non-ST elevated myocardial infarction) [facility])    Diagnostic Studies/Procedures    Cath 12/26/2020   Ost LM to Dist LM lesion is 100% stenosed.   Ost Cx to Dist Cx lesion is 100% stenosed.   Dist LM to Mid LAD lesion is 100% stenosed.   Ost RCA to Prox RCA lesion is 100% stenosed.   Origin to Prox Graft lesion before 1st Mrg  is 100% stenosed.   Origin to Prox Graft lesion before RPDA  is 95% stenosed.   A drug-eluting stent was successfully placed using a STENT ONYX FRONTIER 4.0X22.   Post intervention, there is a 20% residual stenosis.   LIMA graft was visualized by angiography and is normal in caliber.   SVG graft was visualized by angiography.   SVG graft was not visualized.   The graft exhibits no disease.   LV end diastolic pressure is moderately elevated.   Left main and RCA occlusive disease. The patient is graft dependent. Patent LIMA to the LAD Occluded SVG to OM1 and OM3. This is new since 2018. Some collaterals to OM3 Severe stenosis in sequential SVG to PDA and PL branches of the RCA. Also new since 2018. Moderately elevated LVEDP Successful PCI of the SVG to PDA and PL. Used distal protection and DES x 1. Lesion was quite resistant.    Plan: will observe overnight. Patient was given Brilinta in the lab but I would  transition to Plavix in am since he is also on anticoagulation and cost is a major issue. Would give ASA for one month and Plavix for 12 months. May resume Eliquis tomorrow if no bleeding. Will give IV lasix x 1. Need to update Echo- this can be done as outpatient. Will again need to assess CHF therapy. Entresto and SGLT 2 inhibitors have been too expensive. Could consider aldactone.    Echo 12/27/2020  1. Left ventricular ejection fraction by 2D MOD biplane is 30.6 %. Left  ventricular diastolic parameters are consistent with Grade I diastolic  dysfunction (impaired relaxation).   2. Right ventricular systolic function was not well visualized. There is  normal pulmonary artery systolic pressure.   3. Mild paravalvular leak is present, mildy worsened from prior echo  06/25/2020. No dehiscence. AV peak velocity 1.4 m/s; peak gradient 8.5  mmHg. Aortic valve regurgitation is trivial. There is a 26 mm Sapien  prosthetic (TAVR) valve present in the aortic   position. Procedure Date: 02/18/2016.  _____________   History of Present Illness     [name] is a 75 y.o. male with a hx of  LBBB, angina, CAD s/p CABG and previous PCI, CHF, HLD, and HTN presents today for evaluation of chest pain.  He has a known history of coronary disease and is status post CABG in 1996. This included an LIMA  graft to the LAD and diagonal, sequential vein graft to the acute marginal, PDA, and posterior lateral branches of the right coronary, sequential saphenous vein graft to the obtuse marginal vessel and distal circumflex. He had a cardiac catheterization in 2007 which showed that his grafts were all patent. On 04/02/14 he had repeat cath for chest pain and grafts were again patent but there was a 90% stenosis in the SVG to OM2. This was stented with a DES.  Despite this intervention he has continued to have chest pain. He had cholecystectomy in January 1610 without complications and without relief of his pain. CXR was  unremarkable. CT of the chest was negative for PE or aortic pathology. He had EGD by Dr. Oletta Lamas that showed Barrett's esophagus but no evidence of esophagitis. He is on Protonix.    In Dec 2016  he was admitted for evaluation of syncope. Echo showed EF 30-35% with moderate to severe AS. Mean gradient 24 mm Hg. AVA 0.8-0.9. There was concern that AVA was underestimated due to low output. EEG was negative. He had a LBBB. He underwent placement of a BIV-ICD by Dr. Lovena Le.  He then  had a repeat Echo showing some increase in AV gradient. He underwent a Dobutamine Echo. This showed improvement in EF to 40-45%. There was also increase in AV gradient with a mean gradient of 41 mm Hg and severe AS. He underwent uncomplicated TAVR 96/04/5407 via a percutaneous right transfemoral approach. He was treated with a 26 mm Edwards Sapien 3 transcatheter heart valve. Repeat Echo in March 2018 showed improvement in EF to 40-45%. Echo in February 2019 was unchanged.    Despite all these interventions he has continued to have chest pain. He previously described his pain as right parasternal radiating to his jaw. It is relieved with sl Ntg. Typically occurs at night. He was able to do heavy yard work such as lifting, hauling, and blowing leaves without change. He does have some SOB. We did switch to him taking Imdur at bedtime and this seemed to help.   Pacemaker check in January was stable.    On pacer check in April he was noted to have AFib lasting 6.5 hours. He was asymptomatic. Was seen in Afib clinic and started on Eliquis. Echo was updated and showed significant worsening of LV function with EF 25-30%. Mild to moderate MR. TAVR functioning well. Severe LAE and moderate RAE. Seen today with complaints of SOB. His pacer check in July showed no recurrent Afib.    He was seen for further medication titration about a month ago but prior to that he was  seen by Dr. [name] back in 10/2020 and at that time the patient  complained of SOB and LEE and was taking 200 NGT a month. He reported more SOB and was having a hard time doing any yard work without stopping. He does get short of breath at night and has been sleeping sitting up. He notes LE edema but has attributed this to his neuropathy. He continued to have chest pain. States that this pattern has not changed.   Today, he presents for an evaluation of his chest pain which has become worse over the last 1 to 2 months.  It has become more severe and more frequent mostly with activity.  It has progressed in severity and now occurs when he is just walking to the mailbox and back. He has had shortness of breath with activity and at rest.  He has lost  his appetite because every time he eats he also gets chest pain.  He has never been able to determine if the chest pain is from indigestion or cardiac related. He has been taking his Eliquis but experiencing nosebleeds.  He has 1 about every 3 to 4 weeks.  He usually has to pack his nostrils and they stop pretty quickly.  He has noticed more swelling in his bilateral lower extremities but attributes this to lack of activity due to his chest pain.  He also endorses neuropathy and sometimes cannot tell the difference between neuropathy and swelling.  His weight has been stable.  He does not experience any palpitations or fluttering in his chest.  When he has associated shortness of breath he does notice that his heart rate accelerates.  He can lay flat but does not lay flat because he does not sleep much at night.  He usually is up at night and takes naps during the day.  He has never woken up from a nap short of breath.  His blood pressure is little bit elevated today 152/76 and he does not usually take it at home.  He is compliant with all his medications and I have encouraged him to purchase a blood pressure cuff and take his blood pressures for 2 to 3 weeks and send them to me via MyChart.  He is also encouraged to take daily  weights and record those as well.  We have decided to set him up with a cardiac catheterization today for his worsening chest pain.  We discussed this with Dr. [name] who has availability on Thursday.  We will obtain labs today including a CBC and a BMP.   No orthopnea, PND. Reports no palpitations.    Patient was evaluated in the office on 12/24/2020 and was set up for outpatient cardiac catheterization.  Hospital Course     Consultants: N/A   Cardiac catheterization performed by Dr. [name] on 12/26/2020 revealed 100% occluded ostial left main, 100% occluded ostial left circumflex artery, 100% occlusion to distal left main to mid LAD, 100% occluded ostial RCA, patent LIMA to LAD, occluded SVG to OM1 and OM 3 which is new since 2018, severe stenosis seen sequential SVG to PDA and PL branch of RCA treated with single drug-eluting stent.  LVEDP was moderately elevated and the patient received a dose of Lasix in the Cath Lab.  Postprocedure, he was visibly short of breath and required an additional dose of IV Lasix and was also placed on a nitro drip for hypertensive crisis. BNP 1329.  Echocardiogram performed on 12/27/2020 showed EF 30%, normal pulmonary artery systolic pressure, 26 mm sapient prosthetic TAVR valve present in the aorta.  He had significant urine output on IV diuresis. Weight decreased by 10 lbs. Home ramipril and spironolactone discontinued due to rising creatinine, with plan to eventually transition to [facility] once renal function improved.  Urinalysis was concerning for urinary tract infection and the patient was treated with empiric antibiotic.  Per Dr. Harriet Masson, consider start on valsartan 40mg  on follow up, then eventually transition back to [facility]. Patient is on triple therapy with ASA, plavix and Eliquis, consider drop the aspirin after 1 month. He will be discharged on 3 more days of Keflex every 6 hours for UTI. He will need BMET in 1 week and follow up with APP in 2 weeks. Note,  patient was not discharged on lasix. He used to be on 20mg  PO lasix prior to admission but he has not  taking this. Lasix stopped during admission due to AKI. Need to reevaluate on follow up to see if need to restart lasix.     Did the patient have an acute coronary syndrome (MI, NSTEMI, STEMI, etc) this admission?:  No                               Did the patient have a percutaneous coronary intervention (stent / angioplasty)?:  Yes.     Cath/PCI Registry Performance & Quality Measures: Aspirin prescribed? - Yes ADP Receptor Inhibitor (Plavix/Clopidogrel, Brilinta/Ticagrelor or Effient/Prasugrel) prescribed (includes medically managed patients)? - Yes High Intensity Statin (Lipitor 40-80mg  or Crestor 20-40mg ) prescribed? - Yes For EF <40%, was ACEI/ARB prescribed? - No - Reason:  AKI For EF <40%, Aldosterone Antagonist (Spironolactone or Eplerenone) prescribed? - No - Reason:  AKI Cardiac Rehab Phase II ordered? - Yes         _____________  Discharge Vitals Blood pressure 131/63, pulse 67, temperature 97.9 F (36.6 C), resp. rate 18, height 5\' 9"  (1.753 m), weight 90.4 kg, SpO2 93 %.  Filed Weights   12/26/20 1131 12/28/20 0800 12/29/20 0200  Weight: 94.8 kg 90.4 kg 90.4 kg    Labs & Radiologic Studies    CBC Recent Labs    12/29/20 0019 12/30/20 0101  WBC 12.1* 9.5  HGB 12.0* 11.0*  HCT 36.2* 34.4*  MCV 92.3 93.5  PLT 291 010   Basic Metabolic Panel Recent Labs    12/29/20 0019 12/30/20 0101  NA 135 135  K 5.3* 4.3  CL 100 104  CO2 25 24  GLUCOSE 112* 109*  BUN 36* 34*  CREATININE 1.90* 1.59*  CALCIUM 9.5 9.2   Liver Function Tests No results for input(s): AST, ALT, ALKPHOS, BILITOT, PROT, ALBUMIN in the last 72 hours. No results for input(s): LIPASE, AMYLASE in the last 72 hours. High Sensitivity Troponin:   No results for input(s): TROPONINIHS in the last 720 hours.  BNP Invalid input(s): POCBNP D-Dimer No results for input(s): DDIMER in the last 72  hours. Hemoglobin A1C No results for input(s): HGBA1C in the last 72 hours. Fasting Lipid Panel No results for input(s): CHOL, HDL, LDLCALC, TRIG, CHOLHDL, LDLDIRECT in the last 72 hours. Thyroid Function Tests No results for input(s): TSH, T4TOTAL, T3FREE, THYROIDAB in the last 72 hours.  Invalid input(s): FREET3 _____________  CARDIAC CATHETERIZATION  Result Date: 12/27/2020   Ost LM to Dist LM lesion is 100% stenosed.   Ost Cx to Dist Cx lesion is 100% stenosed.   Dist LM to Mid LAD lesion is 100% stenosed.   Ost RCA to Prox RCA lesion is 100% stenosed.   Origin to Prox Graft lesion before 1st Mrg  is 100% stenosed.   Origin to Prox Graft lesion before RPDA  is 95% stenosed.   A drug-eluting stent was successfully placed using a STENT ONYX FRONTIER 4.0X22.   Post intervention, there is a 20% residual stenosis.   LIMA graft was visualized by angiography and is normal in caliber.   SVG graft was visualized by angiography.   SVG graft was not visualized.   The graft exhibits no disease.   LV end diastolic pressure is moderately elevated. Left main and RCA occlusive disease. The patient is graft dependent. Patent LIMA to the LAD Occluded SVG to OM1 and OM3. This is new since 2018. Some collaterals to OM3 Severe stenosis in sequential SVG to PDA and PL branches  of the RCA. Also new since 2018. Moderately elevated LVEDP Successful PCI of the SVG to PDA and PL. Used distal protection and DES x 1. Lesion was quite resistant. Plan: will observe overnight. Patient was given Brilinta in the lab but I would transition to Plavix in am since he is also on anticoagulation and cost is a major issue. Would give ASA for one month and Plavix for 12 months. May resume Eliquis tomorrow if no bleeding. Will give IV lasix x 1. Need to update Echo- this can be done as outpatient. Will again need to assess CHF therapy. Entresto and SGLT 2 inhibitors have been too expensive. Could consider aldactone.   DG CHEST PORT 1  VIEW  Result Date: 12/27/2020 CLINICAL DATA:  Shortness of breath EXAM: PORTABLE CHEST 1 VIEW COMPARISON:  Previous studies including the examination of 05/02/2019 FINDINGS: Transverse diameter of heart is increased. There is prosthetic device in the region of the aortic annulus. Pacemaker/defibrillator battery is seen in the left infraclavicular region. Biventricular pacer leads are noted in place. Lung fields are clear of any infiltrates or pulmonary edema. There is no significant pleural effusion or pneumothorax. IMPRESSION: Cardiomegaly. There are no new infiltrates or signs of pulmonary edema. Electronically Signed   By: Elmer Picker M.D.   On: 12/27/2020 12:17   ECHOCARDIOGRAM LIMITED  Result Date: 12/27/2020    ECHOCARDIOGRAM LIMITED REPORT   Patient Name:   [name] Date of Exam: 12/27/2020 Medical Rec #:  [id number]        Height:       69.0 in Accession #:    [id number]       Weight:       209.0 lb Date of Birth:  07-28-1945         BSA:          2.105 m Patient Age:    31 years         BP:           100/53 mmHg Patient Gender: M                HR:           72 bpm. Exam Location:  Inpatient Procedure: Limited Echo, Cardiac Doppler, Color Doppler and Intracardiac            Opacification Agent Indications:    CHF-Acute Systolic Z60.10  History:        Patient has prior history of Echocardiogram examinations. Acute                 on chronic systolic heart failure, EF 25-30%                 #Ischemic cardiomyopathy                 #Left bundle branch block status post CRT-D                 #CAD s/p CABG                 Paroxysmal Afib.                 Aortic Valve: 26 mm Sapien prosthetic, stented (TAVR) valve is                 present in the aortic position. Procedure Date: 02/18/2016.  Sonographer:    Darlina Sicilian RDCS Referring Phys: [id number] [place]  1. Left ventricular ejection fraction by  2D MOD biplane is 30.6 %. Left ventricular diastolic parameters are  consistent with Grade I diastolic dysfunction (impaired relaxation).  2. Right ventricular systolic function was not well visualized. There is normal pulmonary artery systolic pressure.  3. Mild paravalvular leak is present, mildy worsened from prior echo 06/25/2020. No dehiscence. AV peak velocity 1.4 m/s; peak gradient 8.5 mmHg. Aortic valve regurgitation is trivial. There is a 26 mm Sapien prosthetic (TAVR) valve present in the aortic  position. Procedure Date: 02/18/2016. FINDINGS  Left Ventricle: Left ventricular ejection fraction by 2D MOD biplane is 30.6 %. Abnormal (paradoxical) septal motion, consistent with left bundle branch block. Left ventricular diastolic parameters are consistent with Grade I diastolic dysfunction (impaired relaxation). Right Ventricle: Right ventricular systolic function was not well visualized. There is normal pulmonary artery systolic pressure. The tricuspid regurgitant velocity is 1.64 m/s, and with an assumed right atrial pressure of 8 mmHg, the estimated right ventricular systolic pressure is 86.5 mmHg. Tricuspid Valve: Tricuspid valve regurgitation is not demonstrated. Aortic Valve: Mild paravalvular leak is present, mildy worsened from prior echo 06/25/2020. No dehiscence. AV peak velocity 1.4 m/s; peak gradient 8.5 mmHg. Aortic valve regurgitation is trivial. Aortic valve mean gradient measures 5.0 mmHg. Aortic valve peak gradient measures 8.5 mmHg. Aortic valve area, by VTI measures 2.79 cm. There is a 26 mm Sapien prosthetic, stented (TAVR) valve present in the aortic position. Procedure Date: 02/18/2016. Pulmonic Valve: Pulmonic valve regurgitation is not visualized. LEFT VENTRICLE PLAX 2D                        Biplane EF (MOD) LVOT diam:     2.50 cm         LV Biplane EF:   Left LV SV:         79                               ventricular LV SV Index:   38                               ejection LVOT Area:     4.91 cm                         fraction by                                                  2D MOD                                                 biplane is LV Volumes (MOD)                                30.6 %. LV vol d, MOD    240.0 ml A2C:                           Diastology LV vol d, MOD    219.0 ml  LV e' medial:    4.02 cm/s A4C:                           LV E/e' medial:  11.5 LV vol s, MOD    138.0 ml      LV e' lateral:   3.07 cm/s A2C:                           LV E/e' lateral: 15.1 LV vol s, MOD    178.0 ml A4C: LV SV MOD A2C:   102.0 ml LV SV MOD A4C:   219.0 ml LV SV MOD BP:    70.7 ml AORTIC VALVE AV Area (Vmax):    2.87 cm AV Area (Vmean):   2.95 cm AV Area (VTI):     2.79 cm AV Vmax:           145.67 cm/s AV Vmean:          104.667 cm/s AV VTI:            0.284 m AV Peak Grad:      8.5 mmHg AV Mean Grad:      5.0 mmHg LVOT Vmax:         85.10 cm/s LVOT Vmean:        62.800 cm/s LVOT VTI:          0.161 m LVOT/AV VTI ratio: 0.57 MITRAL VALVE               TRICUSPID VALVE MV Area (PHT): 2.40 cm    TR Peak grad:   10.8 mmHg MV Decel Time: 316 msec    TR Vmax:        164.00 cm/s MV E velocity: 46.30 cm/s MV A velocity: 76.80 cm/s  SHUNTS MV E/A ratio:  0.60        Systemic VTI:  0.16 m                            Systemic Diam: 2.50 cm Phineas Inches Electronically signed by Phineas Inches Signature Date/Time: 12/27/2020/4:08:09 PM    Final    Disposition   Pt is being discharged home today in good condition.  Follow-up Plans & Appointments     Follow-up Information     [facility] Follow up in 1 week(s).   Specialty: Cardiology Why: obtain BMET blood work in the clinic. No need to fast. Contact information: [address] [place] [phone]        Shirley Friar, PA-C Follow up on 01/14/2021.   Specialty: Physician Assistant Why: @12 :00PM. EP follow up Contact information: [place] [address] [phone]         Loel Dubonnet, NP Follow up on  01/24/2021.   Specialty: Cardiology Why: @10 :Johnnie.[place]. Cardiology follow up Contact information: [place] [address] [phone]                Discharge Instructions     Amb Referral to Cardiac Rehabilitation   Complete by: As directed    Diagnosis: Coronary Stents   After initial evaluation and assessments completed: Virtual Based Care may be provided alone or in conjunction with Phase 2 Cardiac Rehab based on patient barriers.: Yes       Discharge Medications   Allergies as of 12/30/2020  No Known Allergies      Medication List     STOP taking these medications    amoxicillin 500 MG tablet Commonly known as: AMOXIL   Entresto 24-26 MG Generic drug: sacubitril-valsartan   ramipril 5 MG capsule Commonly known as: ALTACE       TAKE these medications    apixaban 5 MG Tabs tablet Commonly known as: Eliquis Take 1 tablet (5 mg total) by mouth 2 (two) times daily.   aspirin 81 MG chewable tablet Chew 1 tablet (81 mg total) by mouth daily. Start taking on: December 31, 2020   atorvastatin 80 MG tablet Commonly known as: LIPITOR Take 1 tablet (80 mg total) by mouth daily.   cephALEXin 250 MG capsule Commonly known as: KEFLEX Take 1 capsule (250 mg total) by mouth 4 (four) times daily for 3 days.   clopidogrel 75 MG tablet Commonly known as: PLAVIX Take 1 tablet (75 mg total) by mouth daily. Start taking on: December 31, 2020   finasteride 5 MG tablet Commonly known as: PROSCAR Take 5 mg by mouth daily.   furosemide 20 MG tablet Commonly known as: LASIX Take 1 tablet (20 mg total) by mouth daily as needed. What changed:  when to take this reasons to take this   isosorbide mononitrate 120 MG 24 hr tablet Commonly known as: IMDUR Take 1 tablet (120 mg total) by mouth daily.   metoprolol succinate 50 MG 24 hr tablet Commonly known as: TOPROL-XL TAKE ONE TABLET BY MOUTH DAILY WITH OR IMMEDIATELY FOLLOWING A MEAL    nitroGLYCERIN 0.4 MG SL tablet Commonly known as: NITROSTAT DISSOLVE 1 TABLET UNDER TONGUE FOR CHEST PAIN - IF PAIN REMAINS AFTER 5 MIN, CALL 911 AND REPEAT DOSE. MAX 3 TABLETS IN 15 MINUTES FOR 30 DAY SUPPLY   oxybutynin 5 MG tablet Commonly known as: DITROPAN Take 5 mg by mouth daily.   pantoprazole 40 MG tablet Commonly known as: PROTONIX Take 1 tablet (40 mg total) by mouth daily.   ranolazine 500 MG 12 hr tablet Commonly known as: RANEXA Take 1 tablet (500 mg total) by mouth 2 (two) times daily.   valACYclovir 500 MG tablet Commonly known as: VALTREX TAKE ONE TABLET BY MOUTH DAILY AS NEEDED FOR OUTBREAK   vitamin B-12 500 MCG tablet Commonly known as: CYANOCOBALAMIN Take 500 mcg by mouth daily.   Vitamin C 500 MG Caps Take 500 mg by mouth daily.   Vitamin D-3 125 MCG (5000 UT) Tabs Take 5,000 Units by mouth daily.           Outstanding Labs/Studies   BMET in 1 week  Duration of Discharge Encounter   Greater than 30 minutes including physician time.  Hilbert Corrigan, [place] 12/30/2020, 11:47 AM

## 2021-01-07 NOTE — Progress Notes (Signed)
Electrophysiology Office Note Date: 01/14/2021  ID:  [name], DOB [birth year], MRN [id number]  PCP: Preston Merl, MD Primary Cardiologist: Preston [name], MD Electrophysiologist: Preston Peru, MD   CC: Routine ICD follow-up  [name] is a 75 y.o. male seen today for Preston Peru, MD for post hospital follow up.  Since discharge from hospital the patient reports doing OK. He remains somewhat more SOB since his stent. Denies chest pain.   SOB with more than mild/mod activity, mostly OK with ADLs.  Device History: Medtronic BiV ICD implanted 12/2014 for ICM/CHF  Past Medical History:  Diagnosis Date   Aortic stenosis    Barrett's esophageal ulceration    Chronic systolic CHF (congestive heart failure) ([place])    Coronary artery disease    a. CABG 1996: LIMA graft to the LAD and diagonal, sequential vein graft to the acute marginal, PDA, and posterior lateral branches of the right coronary, sequential saphenous vein graft to the obtuse marginal vessel and distal circumflex. b. Cath 2007 patent grafts. c. s/p DES to SVG-OM2 in 03/2014.   Coronary artery disease involving autologous vein coronary bypass graft with angina pectoris ([place]) 03/13/2014   PCI using DES in SVG to LCx system   Dental caries    pre-heart valve surgery protocol   Gastroesophageal reflux disease    History of erectile dysfunction    History of hiatal hernia    History of kidney stones    History of obesity    History of prostatitis    Hypercholesterolemia    Hypertension    Kidney stones    LBBB (left bundle branch block)    Nodule of kidney 02/04/2016   Incidental 66mm nodule upper pole right kidney noted on CT angiogram - MRI with and without gadolinium contrast recommened in 6 months   OSA (obstructive sleep apnea)    "suppose to wear mask; I don't" (03/13/2014)   PVC's (premature ventricular contractions)    S/P CABG x 7 01/07/1995   LIMA to LAD-diagonal, SVG to OM-LCx, SVG to AM-PD-RPL   S/P  TAVR (transcatheter aortic valve replacement) 02/18/2016   26 mm Edwards Sapien 3 transcatheter heart valve placed via percutaneous right transfemoral approach   Past Surgical History:  Procedure Laterality Date   [place]; ~ 2006   ; Ejection fraction is estimated at 45%   CARDIAC CATHETERIZATION N/A 01/22/2016   Procedure: Right/Left Heart Cath and Coronary/Graft Angiography;  Surgeon: Preston M [name], MD;  Location: [facility];  Service: Cardiovascular;  Laterality: N/A;   CHOLECYSTECTOMY N/A 01/23/2014   Procedure: LAPAROSCOPIC CHOLECYSTECTOMY WITH INTRAOPERATIVE CHOLANGIOGRAM ;  Surgeon: Fanny Skates, MD;  Location: [facility];  Service: General;  Laterality: N/A;   COLONOSCOPY W/ POLYPECTOMY     CORONARY ANGIOPLASTY WITH STENT PLACEMENT  03/13/2014   "1"   [place]   "CABG X 7"   CORONARY STENT INTERVENTION N/A 12/26/2020   Procedure: CORONARY STENT INTERVENTION;  Surgeon: Barnes, Preston M, MD;  Location: [facility];  Service: Cardiovascular;  Laterality: N/A;   CYSTOSCOPY WITH STENT PLACEMENT     EP IMPLANTABLE DEVICE N/A 12/17/2014   Procedure: BiV ICD Insertion CRT-D;  Surgeon: Evans Lance, MD;  Location: [facility];  Service: Cardiovascular;  Laterality: N/A;   LEFT HEART CATH AND CORS/GRAFTS ANGIOGRAPHY N/A 12/26/2020   Procedure: LEFT HEART CATH AND CORS/GRAFTS ANGIOGRAPHY;  Surgeon: Barnes, Preston M, MD;  Location: [facility];  Service: Cardiovascular;  Laterality: N/A;   LEFT HEART CATHETERIZATION WITH CORONARY/GRAFT ANGIOGRAM N/A 03/13/2014   Procedure: LEFT HEART CATHETERIZATION WITH Beatrix Fetters;  Surgeon: Preston M [name], MD;  Location: [facility];  Service: Cardiovascular;  Laterality: N/A;   MULTIPLE EXTRACTIONS WITH ALVEOLOPLASTY N/A 02/07/2016   Procedure: Extraction of tooth #'s 1,2,3,14,15, 18, 20, 29,30,31 with alveoloplasty and dental cleaning of teeth.;  Surgeon: Lenn Cal, DDS;   Location: [facility];  Service: Oral Surgery;  Laterality: N/A;   TEE WITHOUT CARDIOVERSION N/A 02/18/2016   Procedure: TRANSESOPHAGEAL ECHOCARDIOGRAM (TEE);  Surgeon: Sherren Mocha, MD;  Location: [facility];  Service: Open Heart Surgery;  Laterality: N/A;   TONSILLECTOMY  ~ 1950   TRANSCATHETER AORTIC VALVE REPLACEMENT, TRANSFEMORAL N/A 02/18/2016   Procedure: TRANSCATHETER AORTIC VALVE REPLACEMENT, TRANSFEMORAL;  Surgeon: Sherren Mocha, MD;  Location: [place];  Service: Open Heart Surgery;  Laterality: N/A;    Current Outpatient Medications  Medication Sig Dispense Refill   apixaban (ELIQUIS) 5 MG TABS tablet Take 1 tablet (5 mg total) by mouth 2 (two) times daily. 84 tablet 0   Ascorbic Acid (VITAMIN C) 500 MG CAPS Take 500 mg by mouth daily.     aspirin 81 MG chewable tablet Chew 1 tablet (81 mg total) by mouth daily.     atorvastatin (LIPITOR) 80 MG tablet Take 1 tablet (80 mg total) by mouth daily. 90 tablet 3   Cholecalciferol (VITAMIN D-3) 5000 units TABS Take 5,000 Units by mouth daily.      clopidogrel (PLAVIX) 75 MG tablet Take 1 tablet (75 mg total) by mouth daily. 90 tablet 3   finasteride (PROSCAR) 5 MG tablet Take 5 mg by mouth daily.     furosemide (LASIX) 20 MG tablet Take 1 tablet (20 mg total) by mouth daily as needed. 30 tablet 3   isosorbide mononitrate (IMDUR) 120 MG 24 hr tablet Take 1 tablet (120 mg total) by mouth daily. 90 tablet 3   metoprolol succinate (TOPROL-XL) 50 MG 24 hr tablet TAKE ONE TABLET BY MOUTH DAILY WITH OR IMMEDIATELY FOLLOWING A MEAL 90 tablet 3   nitroGLYCERIN (NITROSTAT) 0.4 MG SL tablet DISSOLVE 1 TABLET UNDER TONGUE FOR CHEST PAIN - IF PAIN REMAINS AFTER 5 MIN, CALL 911 AND REPEAT DOSE. MAX 3 TABLETS IN 15 MINUTES FOR 30 DAY SUPPLY 200 tablet 6   oxybutynin (DITROPAN) 5 MG tablet Take 5 mg by mouth daily.      pantoprazole (PROTONIX) 40 MG tablet Take 1 tablet (40 mg total) by mouth daily. 90 tablet 1   ranolazine (RANEXA) 500 MG 12 hr tablet Take 1 tablet  (500 mg total) by mouth 2 (two) times daily. 180 tablet 3   valACYclovir (VALTREX) 500 MG tablet TAKE ONE TABLET BY MOUTH DAILY AS NEEDED FOR OUTBREAK 30 tablet 0   vitamin B-12 (CYANOCOBALAMIN) 500 MCG tablet Take 500 mcg by mouth daily.     No current facility-administered medications for this visit.    Allergies:   Patient has no known allergies.   Social History: Social History   Socioeconomic History   Marital status: Married    Spouse name: Not on file   Number of children: 3   Years of education: Not on file   Highest education level: Not on file  Occupational History   Occupation: Scientist, clinical (histocompatibility and immunogenetics): UNEMPLOYED    Comment: retired  Tobacco Use   Smoking status: Former    Packs/day: 0.75  Years: 15.00    Pack years: 11.25    Types: Cigarettes    Quit date: 01/12/1997    Years since quitting: 24.0   Smokeless tobacco: Never   Tobacco comments:    3/4 pack per day. smoked 15 years off and on  Vaping Use   Vaping Use: Never used  Substance and Sexual Activity   Alcohol use: No    Alcohol/week: 4.0 standard drinks    Types: 4 Cans of beer per week   Drug use: No   Sexual activity: Yes  Other Topics Concern   Not on file  Social History Narrative   Not on file   Social Determinants of Health   Financial Resource Strain: Not on file  Food Insecurity: Not on file  Transportation Needs: Not on file  Physical Activity: Not on file  Stress: Not on file  Social Connections: Not on file  Intimate Partner Violence: Not on file    Family History: Family History  Problem Relation Age of Onset   Heart attack Father    Cancer Sister        Uterine Cancer    Review of Systems: All other systems reviewed and are otherwise negative except as noted above.   Physical Exam: Vitals:   01/14/21 1157  BP: 116/66  Pulse: 61  SpO2: 93%  Weight: 208 lb 6.4 oz (94.5 kg)  Height: 5\' 9"  (1.753 m)     GEN- The patient is well appearing, alert and oriented x 3  today.   HEENT: normocephalic, atraumatic; sclera clear, conjunctiva pink; hearing intact; oropharynx clear; neck supple, no JVP Lymph- no cervical lymphadenopathy Lungs- Clear to ausculation bilaterally, normal work of breathing.  No wheezes, rales, rhonchi Heart- Regular rate and rhythm, no murmurs, rubs or gallops, PMI not laterally displaced GI- soft, non-tender, non-distended, bowel sounds present, no hepatosplenomegaly Extremities- no clubbing or cyanosis. No edema; DP/PT/radial pulses 2+ bilaterally MS- no significant deformity or atrophy Skin- warm and dry, no rash or lesion; ICD pocket well healed Psych- euthymic mood, full affect Neuro- strength and sensation are intact  ICD interrogation- reviewed in detail today,  See PACEART report  EKG:  EKG is ordered today. Personal review of EKG ordered today shows BiV pacing with QRS >180 ms  Recent Labs: 07/11/2020: ALT 21 12/26/2020: Magnesium 1.9 12/27/2020: B Natriuretic Peptide 1,329.5 12/30/2020: BUN 34; Creatinine, Ser 1.59; Hemoglobin 11.0; Platelets 275; Potassium 4.3; Sodium 135   Wt Readings from Last 3 Encounters:  01/14/21 208 lb 6.4 oz (94.5 kg)  12/29/20 199 lb 6.4 oz (90.4 kg)  12/24/20 215 lb (97.5 kg)     Other studies Reviewed: Additional studies/ records that were reviewed today include: Previous EP office notes.   Assessment and Plan:  1.  Chronic systolic dysfunction s/p Medtronic CRT-D  euvolemic today Stable on an appropriate medical regimen Normal ICD function See [facility] report Changed CRT settings to LV -> RV 50 ms from 20 ms. This showed the most benefit at current settings of LV4 to RV coil LV3 showed more wide QRS, LV1 produced stim at nearly all vectors at higher threshold. LV2 showed no significant improvement.  Consider barostim if no improvement. GDMT ongoing as well.   2. PAF Maintaining NSR for the most part.  Continue eliquis   Current medicines are reviewed at length with the  patient today.    Labs/ tests ordered today include:  Orders Placed This Encounter  Procedures   Pro b natriuretic peptide (BNP)  EKG 12-Lead     Disposition:   Follow up with EP APP in 3 months    Signed, Shirley Friar, PA-C  01/14/2021 12:06 PM  [address] [place] [phone] (office) [phone] (fax)

## 2021-01-09 DIAGNOSIS — D044 Carcinoma in situ of skin of scalp and neck: Secondary | ICD-10-CM | POA: Diagnosis not present

## 2021-01-09 DIAGNOSIS — L570 Actinic keratosis: Secondary | ICD-10-CM | POA: Diagnosis not present

## 2021-01-09 DIAGNOSIS — X32XXXA Exposure to sunlight, initial encounter: Secondary | ICD-10-CM | POA: Diagnosis not present

## 2021-01-14 ENCOUNTER — Ambulatory Visit: Payer: Medicare HMO | Admitting: Student

## 2021-01-14 ENCOUNTER — Encounter: Payer: Self-pay | Admitting: Student

## 2021-01-14 ENCOUNTER — Other Ambulatory Visit: Payer: Self-pay

## 2021-01-14 VITALS — BP 116/66 | HR 61 | Ht 69.0 in | Wt 208.4 lb

## 2021-01-14 DIAGNOSIS — I10 Essential (primary) hypertension: Secondary | ICD-10-CM | POA: Diagnosis not present

## 2021-01-14 DIAGNOSIS — I25110 Atherosclerotic heart disease of native coronary artery with unstable angina pectoris: Secondary | ICD-10-CM

## 2021-01-14 DIAGNOSIS — I493 Ventricular premature depolarization: Secondary | ICD-10-CM | POA: Diagnosis not present

## 2021-01-14 DIAGNOSIS — I447 Left bundle-branch block, unspecified: Secondary | ICD-10-CM

## 2021-01-14 DIAGNOSIS — I5043 Acute on chronic combined systolic (congestive) and diastolic (congestive) heart failure: Secondary | ICD-10-CM

## 2021-01-14 DIAGNOSIS — I350 Nonrheumatic aortic (valve) stenosis: Secondary | ICD-10-CM

## 2021-01-14 NOTE — Patient Instructions (Addendum)
Medication Instructions:  Your physician recommends that you continue on your current medications as directed. Please refer to the Current Medication list given to you today.  *If you need a refill on your cardiac medications before your next appointment, please call your pharmacy*   Lab Work: TODAY: BNP  If you have labs (blood work) drawn today and your tests are completely normal, you will receive your results only by: [place] (if you have MyChart) OR A paper copy in the mail If you have any lab test that is abnormal or we need to change your treatment, we will call you to review the results.   Follow-Up: At [facility], you and your health needs are our priority.  As part of our continuing mission to provide you with exceptional heart care, we have created designated Provider Care Teams.  These Care Teams include your primary Cardiologist (physician) and Advanced Practice Providers (APPs -  Physician Assistants and Nurse Practitioners) who all work together to provide you with the care you need, when you need it.  We recommend signing up for the patient portal called "MyChart".  Sign up information is provided on this After Visit Summary.  MyChart is used to connect with patients for Virtual Visits (Telemedicine).  Patients are able to view lab/test results, encounter notes, upcoming appointments, etc.  Non-urgent messages can be sent to your provider as well.   To learn more about what you can do with MyChart, go to [email or web address].    Your next appointment:   02/11/2021

## 2021-01-15 LAB — PRO B NATRIURETIC PEPTIDE: NT-Pro BNP: 8820 pg/mL — ABNORMAL HIGH (ref 0–486)

## 2021-01-23 NOTE — Progress Notes (Signed)
Office Visit    Patient Name: [name] Date of Encounter: 01/24/2021  PCP:  Janeann Merl, MD   [place]  Cardiologist:  Peter [name], MD  Advanced Practice Provider:  No care team member to display Electrophysiologist:  [name], MD     Chief Complaint    [name] is a 76 y.o. male with a hx of left bundle branch block, coronary artery disease s/p CABG and previous PCI, CHF, hyperlipidemia, hypertension presents today for follow-up of CAD  Past Medical History    Past Medical History:  Diagnosis Date   Aortic stenosis    Barrett's esophageal ulceration    Chronic systolic CHF (congestive heart failure) ([place])    Coronary artery disease    a. CABG 1996: LIMA graft to the LAD and diagonal, sequential vein graft to the acute marginal, PDA, and posterior lateral branches of the right coronary, sequential saphenous vein graft to the obtuse marginal vessel and distal circumflex. b. Cath 2007 patent grafts. c. s/p DES to SVG-OM2 in 03/2014.   Coronary artery disease involving autologous vein coronary bypass graft with angina pectoris ([place]) 03/13/2014   PCI using DES in SVG to LCx system   Dental caries    pre-heart valve surgery protocol   Gastroesophageal reflux disease    History of erectile dysfunction    History of hiatal hernia    History of kidney stones    History of obesity    History of prostatitis    Hypercholesterolemia    Hypertension    Kidney stones    LBBB (left bundle branch block)    Nodule of kidney 02/04/2016   Incidental 75mm nodule upper pole right kidney noted on CT angiogram - MRI with and without gadolinium contrast recommened in 6 months   OSA (obstructive sleep apnea)    "suppose to wear mask; I don't" (03/13/2014)   PVC's (premature ventricular contractions)    S/P CABG x 7 01/07/1995   LIMA to LAD-diagonal, SVG to OM-LCx, SVG to AM-PD-RPL   S/P TAVR (transcatheter aortic valve replacement) 02/18/2016   26  mm Edwards Sapien 3 transcatheter heart valve placed via percutaneous right transfemoral approach   Past Surgical History:  Procedure Laterality Date   [place]; ~ 2006   ; Ejection fraction is estimated at 45%   CARDIAC CATHETERIZATION N/A 01/22/2016   Procedure: Right/Left Heart Cath and Coronary/Graft Angiography;  Surgeon: [name] [name], MD;  Location: [facility];  Service: Cardiovascular;  Laterality: N/A;   CHOLECYSTECTOMY N/A 01/23/2014   Procedure: LAPAROSCOPIC CHOLECYSTECTOMY WITH INTRAOPERATIVE CHOLANGIOGRAM ;  Surgeon: Fanny Skates, MD;  Location: [facility];  Service: General;  Laterality: N/A;   COLONOSCOPY W/ POLYPECTOMY     CORONARY ANGIOPLASTY WITH STENT PLACEMENT  03/13/2014   "1"   [place]   "CABG X 7"   CORONARY STENT INTERVENTION N/A 12/26/2020   Procedure: CORONARY STENT INTERVENTION;  Surgeon: [name], [name], MD;  Location: [facility];  Service: Cardiovascular;  Laterality: N/A;   CYSTOSCOPY WITH STENT PLACEMENT     EP IMPLANTABLE DEVICE N/A 12/17/2014   Procedure: BiV ICD Insertion CRT-D;  Surgeon: Evans Lance, MD;  Location: [place];  Service: Cardiovascular;  Laterality: N/A;   LEFT HEART CATH AND CORS/GRAFTS ANGIOGRAPHY N/A 12/26/2020   Procedure: LEFT HEART CATH AND CORS/GRAFTS ANGIOGRAPHY;  Surgeon: [name], [name], MD;  Location: [facility];  Service: Cardiovascular;  Laterality: N/A;   LEFT HEART CATHETERIZATION WITH CORONARY/GRAFT ANGIOGRAM N/A 03/13/2014   Procedure: LEFT HEART CATHETERIZATION WITH Beatrix Fetters;  Surgeon: [name] [name], MD;  Location: [facility];  Service: Cardiovascular;  Laterality: N/A;   MULTIPLE EXTRACTIONS WITH ALVEOLOPLASTY N/A 02/07/2016   Procedure: Extraction of tooth #'s 1,2,3,14,15, 18, 20, 29,30,31 with alveoloplasty and dental cleaning of teeth.;  Surgeon: Lenn Cal, DDS;  Location: [facility];  Service: Oral Surgery;  Laterality: N/A;    TEE WITHOUT CARDIOVERSION N/A 02/18/2016   Procedure: TRANSESOPHAGEAL ECHOCARDIOGRAM (TEE);  Surgeon: Sherren Mocha, MD;  Location: [place];  Service: Open Heart Surgery;  Laterality: N/A;   TONSILLECTOMY  ~ 1950   TRANSCATHETER AORTIC VALVE REPLACEMENT, TRANSFEMORAL N/A 02/18/2016   Procedure: TRANSCATHETER AORTIC VALVE REPLACEMENT, TRANSFEMORAL;  Surgeon: Sherren Mocha, MD;  Location: [facility];  Service: Open Heart Surgery;  Laterality: N/A;    Allergies  No Known Allergies  History of Present Illness    [name] is a 76 y.o. male with a hx of left bundle branch block, coronary artery disease s/p CABG and previous PCI, CHF, hyperlipidemia, hypertension last seen 01/14/2021 by Oda Kilts, PA.  He has known coronary artery disease with prior CABG in 1996 including LIMA-LAD and diagonal, SVG-acute marginal, PDA, and posterior lateral branches of the right coronary, sequential SVG to the obtuse marginal vessel and distal circumflex.  Cardiac cath 2007 showing patent grafts.  Repeat cath March 2016 with grafts patent but 90% stenosis in SVG-OM which was stented with DES.  He continued to have chest pain and had a cholecystectomy 07/3530 without complications and without relief of pain.  CTA chest negative for PE or aortic pathology.  EGD showed Barrett's esophagus but no esophagitis.  Admitted December 2016 for syncope.  Echo LVEF 30 to 35% with moderate to severe aortic stenosis with mean gradient 24 mmHg.  EEG negative.  He had left bundle branch block.  He underwent placement of BiV ICD by Dr. Lovena Le.  Repeat echo with some increase in AV gradient.  He underwent dobutamine echo showing improvement in LVEF 40 to 45%.  He underwent uncomplicated TAVR 09/20/2424 via percutaneous right transfemoral approach due to mean gradient 41 mmHg severe aortic stenosis.  Treated with 26 mm Edwards SAPIEN 3 transcatheter heart valve.  Repeat echo 03/2016 LVEF 40 to 45%.  Echo 02/2017 unchanged.  He continued to  have chest pain despite elevated forementioned interventions and was started on Imdur with some improvement.  He was noted to have atrial fibrillation based on pacer check.  He was started on Eliquis.  His echo 06/2020 showed worsening LVEF 25 to 30%.  He was evaluated in the clinic 12/2020 intended for outpatient cardiac catheterization.  Cardiac catheterization 12/26/2020 revealed 100% occluded ostial left main, 100% occluded ostial left circumflex artery, 100% occlusion to distal left main to mid LAD, 100% occluded ostial RCA, patent LIMA-LAD, occluded SVG to OM1 and OM 3 which is new since 2018, severe stenosis seen sequential SVG to PDA and PL branch of RCA treated with single DES.  LVEDP moderately elevated and he received a dose of Lasix in the Cath Lab.  He did require additional IV Lasix postprocedure.  Echo 12/27/2020 LVEF 30%, normal PASP, TAVR valve present in the aorta.  Weight decreased by 10 pounds during admission.  Ramipril  and spironolactone were discontinued due to rising creatinine with plan to eventually transition to [facility] once renal function improved.  He was treated  with empiric antibiotic due to concern for UTI.  He was recommended for triple therapy aspirin Plavix, Eliquis for 1 month.  Presents today for follow-up.Notes dyspnea on exertion since discharge Feeling a little bit better over the last week or so. Tells me he takes his nitroglycerin "all the time". Uses 200 tablet per month per his report. This is overall unchanged. Endorses difficulty with acid reflux despite his Protonix. He does have to take an additional antacid quite often. Notes occasional lightheadedness. His lower extremity edema is worse since his stent per his report which he attributes to sitting most of the day. Has been taking Furosemide over te last two days with some improvement in edema.   EKGs/Labs/Other Studies Reviewed:   The following studies were reviewed today:  Cath 12/26/2020   Ost LM to  Dist LM lesion is 100% stenosed.   Ost Cx to Dist Cx lesion is 100% stenosed.   Dist LM to Mid LAD lesion is 100% stenosed.   Ost RCA to Prox RCA lesion is 100% stenosed.   Origin to Prox Graft lesion before 1st Mrg  is 100% stenosed.   Origin to Prox Graft lesion before RPDA  is 95% stenosed.   A drug-eluting stent was successfully placed using a STENT ONYX FRONTIER 4.0X22.   Post intervention, there is a 20% residual stenosis.   LIMA graft was visualized by angiography and is normal in caliber.   SVG graft was visualized by angiography.   SVG graft was not visualized.   The graft exhibits no disease.   LV end diastolic pressure is moderately elevated.   Left main and RCA occlusive disease. The patient is graft dependent. Patent LIMA to the LAD Occluded SVG to OM1 and OM3. This is new since 2018. Some collaterals to OM3 Severe stenosis in sequential SVG to PDA and PL branches of the RCA. Also new since 2018. Moderately elevated LVEDP Successful PCI of the SVG to PDA and PL. Used distal protection and DES x 1. Lesion was quite resistant.    Plan: will observe overnight. Patient was given Brilinta in the lab but I would transition to Plavix in am since he is also on anticoagulation and cost is a major issue. Would give ASA for one month and Plavix for 12 months. May resume Eliquis tomorrow if no bleeding. Will give IV lasix x 1. Need to update Echo- this can be done as outpatient. Will again need to assess CHF therapy. Entresto and SGLT 2 inhibitors have been too expensive. Could consider aldactone.      Echo 12/27/2020  1. Left ventricular ejection fraction by 2D MOD biplane is 30.6 %. Left  ventricular diastolic parameters are consistent with Grade I diastolic  dysfunction (impaired relaxation).   2. Right ventricular systolic function was not well visualized. There is  normal pulmonary artery systolic pressure.   3. Mild paravalvular leak is present, mildy worsened from prior echo   06/25/2020. No dehiscence. AV peak velocity 1.4 m/s; peak gradient 8.5  mmHg. Aortic valve regurgitation is trivial. There is a 26 mm Sapien  prosthetic (TAVR) valve present in the aortic   position. Procedure Date: 02/18/2016.   EKG:  No EKG today.   Recent Labs: 07/11/2020: ALT 21 12/26/2020: Magnesium 1.9 12/27/2020: B Natriuretic Peptide 1,329.5 12/30/2020: BUN 34; Creatinine, Ser 1.59; Hemoglobin 11.0; Platelets 275; Potassium 4.3; Sodium 135 01/14/2021: NT-Pro BNP 8,820  Recent Lipid Panel    Component Value Date/Time   CHOL 120 07/11/2020 1015  TRIG 65 07/11/2020 1015   HDL 41 07/11/2020 1015   CHOLHDL 2.9 07/11/2020 1015   CHOLHDL 3.9 03/12/2015 0932   VLDL 21 03/12/2015 0932   LDLCALC 65 07/11/2020 1015    Risk Assessment/Calculations:   CHA2DS2-VASc Score = 5 This indicates a 7.2% annual risk of stroke. The patient's score is based upon: CHF History: 1 HTN History: 1 Diabetes History: 0 Stroke History: 0 Vascular Disease History: 1 Age Score: 2 Gender Score: 0    Home Medications   Current Meds  Medication Sig   apixaban (ELIQUIS) 5 MG TABS tablet Take 1 tablet (5 mg total) by mouth 2 (two) times daily.   Ascorbic Acid (VITAMIN C) 500 MG CAPS Take 500 mg by mouth daily.   aspirin 81 MG chewable tablet Chew 1 tablet (81 mg total) by mouth daily.   atorvastatin (LIPITOR) 80 MG tablet Take 1 tablet (80 mg total) by mouth daily.   Cholecalciferol (VITAMIN D-3) 5000 units TABS Take 5,000 Units by mouth daily.    clopidogrel (PLAVIX) 75 MG tablet Take 1 tablet (75 mg total) by mouth daily.   finasteride (PROSCAR) 5 MG tablet Take 5 mg by mouth daily.   furosemide (LASIX) 20 MG tablet Take 1 tablet (20 mg total) by mouth daily as needed.   isosorbide mononitrate (IMDUR) 120 MG 24 hr tablet Take 1 tablet (120 mg total) by mouth daily.   metoprolol succinate (TOPROL-XL) 50 MG 24 hr tablet TAKE ONE TABLET BY MOUTH DAILY WITH OR IMMEDIATELY FOLLOWING A MEAL    nitroGLYCERIN (NITROSTAT) 0.4 MG SL tablet DISSOLVE 1 TABLET UNDER TONGUE FOR CHEST PAIN - IF PAIN REMAINS AFTER 5 MIN, CALL 911 AND REPEAT DOSE. MAX 3 TABLETS IN 15 MINUTES FOR 30 DAY SUPPLY   oxybutynin (DITROPAN) 5 MG tablet Take 5 mg by mouth daily.    pantoprazole (PROTONIX) 40 MG tablet Take 1 tablet (40 mg total) by mouth daily.   ranolazine (RANEXA) 500 MG 12 hr tablet Take 1 tablet (500 mg total) by mouth 2 (two) times daily.   valACYclovir (VALTREX) 500 MG tablet TAKE ONE TABLET BY MOUTH DAILY AS NEEDED FOR OUTBREAK   vitamin B-12 (CYANOCOBALAMIN) 500 MCG tablet Take 500 mcg by mouth daily.     Review of Systems      All other systems reviewed and are otherwise negative except as noted above.  Physical Exam    VS:  BP 120/70 (BP Location: Left Arm, Patient Position: Sitting, Cuff Size: Large)    Pulse 66    Ht 5\' 9"  (1.753 m)    Wt 208 lb 6.4 oz (94.5 kg)    SpO2 96%    BMI 30.78 kg/m  , BMI Body mass index is 30.78 kg/m.  Wt Readings from Last 3 Encounters:  01/24/21 208 lb 6.4 oz (94.5 kg)  01/14/21 208 lb 6.4 oz (94.5 kg)  12/29/20 199 lb 6.4 oz (90.4 kg)    GEN: Well nourished, well developed, in no acute distress. HEENT: normal. Neck: Supple, no JVD, carotid bruits, or masses. Cardiac: RRR, no murmurs, rubs, or gallops. No clubbing, cyanosis, edema.  Radials/PT 2+ and equal bilaterally.  Respiratory:  Respirations regular and unlabored, clear to auscultation bilaterally. GI: Soft, nontender, nondistended. MS: No deformity or atrophy. Skin: Warm and dry, no rash. Neuro:  Strength and sensation are intact. Psych: Normal affect.  Assessment & Plan    Chronic systolic heart failure / ICM - Entresto previously cost prohibitive. Also thinks he might have  been dizzy while taking. Ramipril and Spironolactone held during admission due to renal dysfunction.  Update CBC, BMP today.  If renal function improving plan to resume spironolactone.  Present GDMT includes Lasix, Imdur,  Toprol. Low salt diet, fluid restriction <2L, daily weight recommended. Offered cardiac rehab which he declined.   PAF / chronic anticoagulation - Denies palpitations. EKG today NSR. Denies bleeding complications on Eliquis. CHA2DS2-VASc Score = 5 [CHF History: 1, HTN History: 1, Diabetes History: 0, Stroke History: 0, Vascular Disease History: 1, Age Score: 2, Gender Score: 0].  Therefore, the patient's annual risk of stroke is 7.2 %.    CBC today for monitoring.  HLD, LDL goal <70 - Continue Atorvastatin 80mg  QD. Denies myalgias.   CAD - reports stable anginal symptoms.  EKG today with no acute ST/T wave changes.  GDMT includes aspirin, Plavix, Imdur, Ranexa, metoprolol, as needed nitroglycerin. He declines cardiac rehab.  He will stop ASA 1 month from stent 01/27/21. Continue Plavix for 1 year from stent 12/27/20 (PCI of SVG-PDA and PL).  S/p CRT/ICD - Continue to follow with EP. Presently not candidate for Barostim due to elevated BNP. Increasing optimization of heart failure therapy, as detailed above.  Hopeful to be able to improve BNP.  Upcoming follow-up with Oda Kilts, PA to discuss 03/24/2021.  Severe AS S/p TAVR - Continue to monitor with periodic echo. Most recent echo 12/27/2020 with trivial AI, mild perivalvular leak, AV peak velocity 1.4 m/s and peak gradient 8.5 mmHg.   Disposition: Follow up in February as scheduled with Peter [name], MD or APP.  Signed, Loel Dubonnet, NP 01/24/2021, 4:19 PM Onset

## 2021-01-24 ENCOUNTER — Other Ambulatory Visit: Payer: Self-pay

## 2021-01-24 ENCOUNTER — Ambulatory Visit (HOSPITAL_BASED_OUTPATIENT_CLINIC_OR_DEPARTMENT_OTHER): Payer: Medicare HMO | Admitting: Family

## 2021-01-24 ENCOUNTER — Encounter (HOSPITAL_BASED_OUTPATIENT_CLINIC_OR_DEPARTMENT_OTHER): Payer: Self-pay | Admitting: Family

## 2021-01-24 VITALS — BP 120/70 | HR 66 | Ht 69.0 in | Wt 208.4 lb

## 2021-01-24 DIAGNOSIS — I350 Nonrheumatic aortic (valve) stenosis: Secondary | ICD-10-CM | POA: Diagnosis not present

## 2021-01-24 DIAGNOSIS — I25118 Atherosclerotic heart disease of native coronary artery with other forms of angina pectoris: Secondary | ICD-10-CM | POA: Diagnosis not present

## 2021-01-24 DIAGNOSIS — E785 Hyperlipidemia, unspecified: Secondary | ICD-10-CM

## 2021-01-24 DIAGNOSIS — Z952 Presence of prosthetic heart valve: Secondary | ICD-10-CM

## 2021-01-24 DIAGNOSIS — I5022 Chronic systolic (congestive) heart failure: Secondary | ICD-10-CM

## 2021-01-24 DIAGNOSIS — D6859 Other primary thrombophilia: Secondary | ICD-10-CM | POA: Diagnosis not present

## 2021-01-24 NOTE — Patient Instructions (Addendum)
Medication Instructions:  Your physician has recommended you make the following change in your medication:   On 01/27/21 you may stop Aspirin  You should continue Plavix for one year after having stent  *If you need a refill on your cardiac medications before your next appointment, please call your pharmacy*   Lab Work: Your physician recommends that you return for lab work today: CBC, BMP  If you have labs (blood work) drawn today and your tests are completely normal, you will receive your results only by: [place] (if you have [place]) OR A paper copy in the mail If you have any lab test that is abnormal or we need to change your treatment, we will call you to review the results.   Testing/Procedures: None ordered today  Follow-Up: At [facility], you and your health needs are our priority.  As part of our continuing mission to provide you with exceptional heart care, we have created designated Provider Care Teams.  These Care Teams include your primary Cardiologist (physician) and Advanced Practice Providers (APPs -  Physician Assistants and Nurse Practitioners) who all work together to provide you with the care you need, when you need it.  We recommend signing up for the patient portal called "MyChart".  Sign up information is provided on this After Visit Summary.  MyChart is used to connect with patients for Virtual Visits (Telemedicine).  Patients are able to view lab/test results, encounter notes, upcoming appointments, etc.  Non-urgent messages can be sent to your provider as well.   To learn more about what you can do with MyChart, go to [email or web address].    Your next appointment:   As scheduled with Dr. [name] on February 14th   Other Instructions  Heart Healthy Diet Recommendations: A low-salt diet is recommended. Meats should be grilled, baked, or boiled. Avoid fried foods. Focus on lean protein sources like fish or chicken with vegetables and fruits.  The American Heart Association is a Microbiologist!  American Heart Association Diet and Lifeystyle Recommendations    Exercise recommendations: The American Heart Association recommends 150 minutes of moderate intensity exercise weekly. Try 30 minutes of moderate intensity exercise 4-5 times per week. This could include walking, jogging, or swimming.  Recommend weighing daily and keeping a log. Please call our office if you have weight gain of 2 pounds overnight or 5 pounds in 1 week.   Date  Time Weight                                             To prevent or reduce lower extremity swelling: Eat a low salt diet. Salt makes the body hold onto extra fluid which causes swelling. Sit with legs elevated. For example, in the recliner or on an [place].  Wear knee-high compression stockings during the daytime. Ones labeled 15-20 mmHg provide good compression.

## 2021-01-25 LAB — BASIC METABOLIC PANEL
BUN/Creatinine Ratio: 19 (ref 10–24)
BUN: 25 mg/dL (ref 8–27)
CO2: 18 mmol/L — ABNORMAL LOW (ref 20–29)
Calcium: 10 mg/dL (ref 8.6–10.2)
Creatinine, Ser: 1.34 mg/dL — ABNORMAL HIGH (ref 0.76–1.27)
Glucose: 93 mg/dL (ref 70–99)
eGFR: 55 mL/min/{1.73_m2} — ABNORMAL LOW (ref >59)

## 2021-01-25 LAB — CBC
Hematocrit: 36.2 % — ABNORMAL LOW (ref 37.5–51.0)
Hemoglobin: 11.9 g/dL — ABNORMAL LOW (ref 13.0–17.7)
MCH: 30.4 pg (ref 26.6–33.0)
MCHC: 32.9 g/dL (ref 31.5–35.7)
MCV: 93 fL (ref 79–97)
Platelets: 259 10*3/uL (ref 150–450)
RBC: 3.91 x10E6/uL — ABNORMAL LOW (ref 4.14–5.80)
RDW: 13.6 % (ref 11.6–15.4)
WBC: 5.4 10*3/uL (ref 3.4–10.8)

## 2021-01-27 ENCOUNTER — Telehealth (HOSPITAL_BASED_OUTPATIENT_CLINIC_OR_DEPARTMENT_OTHER): Payer: Self-pay

## 2021-01-27 DIAGNOSIS — I5022 Chronic systolic (congestive) heart failure: Secondary | ICD-10-CM

## 2021-01-27 DIAGNOSIS — E785 Hyperlipidemia, unspecified: Secondary | ICD-10-CM

## 2021-01-27 MED ORDER — SPIRONOLACTONE 25 MG PO TABS: 12.5000 mg | ORAL_TABLET | Freq: Every day | ORAL | 3 refills | Status: DC

## 2021-01-27 NOTE — Telephone Encounter (Signed)
As fluid comes off there will be less urine output. Okay to add on BNP with BMP.   Loel Dubonnet, NP

## 2021-01-27 NOTE — Addendum Note (Signed)
Addended by: Gerald Stabs on: 01/27/2021 11:27 AM   Modules accepted: Orders

## 2021-01-27 NOTE — Telephone Encounter (Addendum)
Called patient to review lab results. Patient endorses understanding. Agreeable to adding on Spironolactone and getting labs done. Patient's wife expresses concern that fluid pill is not making him pee very much. Patient states his weight is stable though. Did educate the patient that adding spironolactone would also mildly  help with fluid control. Should we consider adding a BNP to check fluid status? Will forward to Laurann Montana, NP for further advisement.    Addend 1/16 at 11:27 per Laurann Montana, Ok to add on BNP to check fluid status    ----- Message from Loel Dubonnet, NP sent at 01/27/2021  8:08 AM EST ----- CBC with stable mild anemia. Kidney function improving. Start Spironolactone 12.5mg  daily. Continue Lasix 20mg  QD. Recommend repeat BMP on Friday (01/31/21) as labs were unable to collect potassium level and need to monitor closely.

## 2021-01-31 DIAGNOSIS — I5022 Chronic systolic (congestive) heart failure: Secondary | ICD-10-CM | POA: Diagnosis not present

## 2021-01-31 LAB — BASIC METABOLIC PANEL
BUN/Creatinine Ratio: 18 (ref 10–24)
BUN: 25 mg/dL (ref 8–27)
CO2: 23 mmol/L (ref 20–29)
Calcium: 9.7 mg/dL (ref 8.6–10.2)
Chloride: 101 mmol/L (ref 96–106)
Creatinine, Ser: 1.41 mg/dL — ABNORMAL HIGH (ref 0.76–1.27)
Glucose: 92 mg/dL (ref 70–99)
Potassium: 4.6 mmol/L (ref 3.5–5.2)
Sodium: 140 mmol/L (ref 134–144)
eGFR: 51 mL/min/{1.73_m2} — ABNORMAL LOW (ref >59)

## 2021-02-03 ENCOUNTER — Telehealth: Payer: Self-pay | Admitting: Family

## 2021-02-03 NOTE — Telephone Encounter (Signed)
Patient denies being weight up and states he is actually weight down. Denies any known triggers for the panic and anxiety attacks. Encouraged patient to follow up with his PCP for further evaluation of panic feelings!    Patient also asked about his most recent lab results, they were reviewed with the patient.   "Stable kidney function. Normal electrolytes. Good result! Continue current medications. "

## 2021-02-03 NOTE — Telephone Encounter (Signed)
Cardiac meds include Eliquis, Atorvastatin, Plavix, lasix, Imdur, Toprol, Ranexa, Spironolactone. None of his cardiac medications typically cause panic attacks.   If he is volume overloaded this can cause shortness of breath which can cause panic sensation. If his weight is up, shortness of breath increased, edema increased - recommend take Lasix for 3 days (if taking regularly, recommend he increase to 40mg  for 3 days).   Recommend prompt follow up wtih primary care to discuss anxiety symptoms as well.   Preston Dubonnet, NP

## 2021-02-03 NOTE — Telephone Encounter (Signed)
Meds? Panic Attacks?

## 2021-02-03 NOTE — Telephone Encounter (Signed)
Patient calling to speak with Laurann Montana about having some panic attacks and would like to discuss his medications.

## 2021-02-06 ENCOUNTER — Ambulatory Visit (INDEPENDENT_AMBULATORY_CARE_PROVIDER_SITE_OTHER): Payer: Medicare HMO

## 2021-02-06 DIAGNOSIS — I214 Non-ST elevation (NSTEMI) myocardial infarction: Secondary | ICD-10-CM

## 2021-02-06 LAB — CUP PACEART REMOTE DEVICE CHECK
Battery Remaining Longevity: 14 mo
Battery Voltage: 2.88 V
Brady Statistic AP VP Percent: 30.6 %
Brady Statistic AP VS Percent: 0.01 %
Brady Statistic AS VP Percent: 69.16 %
Brady Statistic AS VS Percent: 0.23 %
Brady Statistic RA Percent Paced: 30.05 %
Brady Statistic RV Percent Paced: 98.43 %
Date Time Interrogation Session: 20230126012206
HighPow Impedance: 69 Ohm
Implantable Lead Implant Date: 20161205
Implantable Lead Implant Date: 20161205
Implantable Lead Implant Date: 20161205
Implantable Lead Location: 753858
Implantable Lead Location: 753859
Implantable Lead Location: 753860
Implantable Lead Model: 4598
Implantable Lead Model: 5076
Implantable Lead Model: 6935
Implantable Pulse Generator Implant Date: 20161205
Lead Channel Impedance Value: 304 Ohm
Lead Channel Impedance Value: 304 Ohm
Lead Channel Impedance Value: 342 Ohm
Lead Channel Impedance Value: 342 Ohm
Lead Channel Impedance Value: 342 Ohm
Lead Channel Impedance Value: 399 Ohm
Lead Channel Impedance Value: 399 Ohm
Lead Channel Impedance Value: 418 Ohm
Lead Channel Impedance Value: 551 Ohm
Lead Channel Impedance Value: 551 Ohm
Lead Channel Impedance Value: 589 Ohm
Lead Channel Impedance Value: 589 Ohm
Lead Channel Impedance Value: 589 Ohm
Lead Channel Pacing Threshold Amplitude: 0.625 V
Lead Channel Pacing Threshold Amplitude: 0.75 V
Lead Channel Pacing Threshold Amplitude: 0.875 V
Lead Channel Pacing Threshold Pulse Width: 0.4 ms
Lead Channel Pacing Threshold Pulse Width: 0.4 ms
Lead Channel Pacing Threshold Pulse Width: 0.4 ms
Lead Channel Sensing Intrinsic Amplitude: 1.75 mV
Lead Channel Sensing Intrinsic Amplitude: 1.75 mV
Lead Channel Sensing Intrinsic Amplitude: 10.375 mV
Lead Channel Sensing Intrinsic Amplitude: 15.375 mV
Lead Channel Setting Pacing Amplitude: 2 V
Lead Channel Setting Pacing Amplitude: 2 V
Lead Channel Setting Pacing Amplitude: 2.5 V
Lead Channel Setting Pacing Pulse Width: 0.4 ms
Lead Channel Setting Pacing Pulse Width: 0.4 ms
Lead Channel Setting Sensing Sensitivity: 0.3 mV

## 2021-02-11 ENCOUNTER — Encounter: Payer: Medicare HMO | Admitting: Student

## 2021-02-17 NOTE — Progress Notes (Signed)
Remote ICD transmission.   

## 2021-02-20 DIAGNOSIS — L821 Other seborrheic keratosis: Secondary | ICD-10-CM | POA: Diagnosis not present

## 2021-02-20 DIAGNOSIS — Z08 Encounter for follow-up examination after completed treatment for malignant neoplasm: Secondary | ICD-10-CM | POA: Diagnosis not present

## 2021-02-20 DIAGNOSIS — Z85828 Personal history of other malignant neoplasm of skin: Secondary | ICD-10-CM | POA: Diagnosis not present

## 2021-02-20 NOTE — Progress Notes (Signed)
Office Visit    Patient Name: [name] Date of Encounter: 02/25/2021  PCP:  Preston Merl, Barnes   [place]  Cardiologist:  Preston Sammarco [name], Barnes  Advanced Practice Provider:  No care team member to display Electrophysiologist:  Preston [place], Barnes     Chief Complaint    [name] is a 76 y.o. male with a hx of left bundle branch block, coronary artery disease s/p CABG and previous PCI, CHF, hyperlipidemia, hypertension presents today for follow-up of CAD  Past Medical History    Past Medical History:  Diagnosis Date   Aortic stenosis    Barrett's esophageal ulceration    Chronic systolic CHF (congestive heart failure) ([place])    Coronary artery disease    a. CABG 1996: LIMA graft to the LAD and diagonal, sequential vein graft to the acute marginal, PDA, and posterior lateral branches of the right coronary, sequential saphenous vein graft to the obtuse marginal vessel and distal circumflex. b. Cath 2007 patent grafts. c. s/p DES to SVG-OM2 in Barnes/2016.   Coronary artery disease involving autologous vein coronary bypass graft with angina pectoris ([place]) 03/13/2014   PCI using DES in SVG to LCx system   Dental caries    pre-heart valve surgery protocol   Gastroesophageal reflux disease    History of erectile dysfunction    History of hiatal hernia    History of kidney stones    History of obesity    History of prostatitis    Hypercholesterolemia    Hypertension    Kidney stones    LBBB (left bundle branch block)    Nodule of kidney 02/04/2016   Incidental 71mm nodule upper pole right kidney noted on CT angiogram - MRI with and without gadolinium contrast recommened in 6 months   OSA (obstructive sleep apnea)    "suppose to wear mask; I don't" (Barnes/01/2014)   PVC's (premature ventricular contractions)    S/P CABG x 7 01/07/1995   LIMA to LAD-diagonal, SVG to OM-LCx, SVG to AM-PD-RPL   S/P TAVR (transcatheter aortic valve replacement) 02/18/2016   26  mm Edwards Sapien Barnes transcatheter heart valve placed via percutaneous right transfemoral approach   Past Surgical History:  Procedure Laterality Date   [place]; ~ 2006   ; Ejection fraction is estimated at 45%   CARDIAC CATHETERIZATION N/A 01/22/2016   Procedure: Right/Left Heart Cath and Coronary/Graft Angiography;  Surgeon: Preston Woollard M [name], Barnes;  Location: [place] [facility];  Service: Cardiovascular;  Laterality: N/A;   CHOLECYSTECTOMY N/A 01/23/2014   Procedure: LAPAROSCOPIC CHOLECYSTECTOMY WITH INTRAOPERATIVE CHOLANGIOGRAM ;  Surgeon: Preston Skates, Barnes;  Location: [place];  Service: General;  Laterality: N/A;   COLONOSCOPY W/ POLYPECTOMY     CORONARY ANGIOPLASTY WITH STENT PLACEMENT  Barnes/01/2014   "1"   [place]   "CABG X 7"   CORONARY STENT INTERVENTION N/A 12/26/2020   Procedure: CORONARY STENT INTERVENTION;  Surgeon: [name];  Location: [place] [facility];  Service: Cardiovascular;  Laterality: N/A;   CYSTOSCOPY WITH STENT PLACEMENT     EP IMPLANTABLE DEVICE N/A 12/17/2014   Procedure: BiV ICD Insertion CRT-D;  Surgeon: Preston Lance, Barnes;  Location: [place];  Service: Cardiovascular;  Laterality: N/A;   LEFT HEART CATH AND CORS/GRAFTS ANGIOGRAPHY N/A 12/26/2020   Procedure: LEFT HEART CATH AND CORS/GRAFTS ANGIOGRAPHY;  Surgeon: [name];  Location: [place] [facility];  Service: Cardiovascular;  Laterality: N/A;   LEFT HEART CATHETERIZATION WITH CORONARY/GRAFT ANGIOGRAM N/A Barnes/01/2014   Procedure: LEFT HEART CATHETERIZATION WITH Beatrix Fetters;  Surgeon: Preston Kiper M [name], Barnes;  Location: [facility];  Service: Cardiovascular;  Laterality: N/A;   MULTIPLE EXTRACTIONS WITH ALVEOLOPLASTY N/A 02/07/2016   Procedure: Extraction of tooth #'s 1,2,Barnes,14,15, 18, 20, 29,30,31 with alveoloplasty and dental cleaning of teeth.;  Surgeon: Lenn Cal, DDS;  Location: [place];  Service: Oral Surgery;  Laterality: N/A;    TEE WITHOUT CARDIOVERSION N/A 02/18/2016   Procedure: TRANSESOPHAGEAL ECHOCARDIOGRAM (TEE);  Surgeon: Preston Mocha, Barnes;  Location: [place];  Service: Open Heart Surgery;  Laterality: N/A;   TONSILLECTOMY  ~ 1950   TRANSCATHETER AORTIC VALVE REPLACEMENT, TRANSFEMORAL N/A 02/18/2016   Procedure: TRANSCATHETER AORTIC VALVE REPLACEMENT, TRANSFEMORAL;  Surgeon: Preston Mocha, Barnes;  Location: [place];  Service: Open Heart Surgery;  Laterality: N/A;    Allergies  No Known Allergies  History of Present Illness    [name] is a 76 y.o. male with a hx of left bundle branch block, coronary artery disease s/p CABG and previous PCI, CHF, hyperlipidemia, hypertension last seen 1/Barnes/2023 by Preston Kilts, PA.  He has known coronary artery disease with prior CABG in 1996 including LIMA-LAD and diagonal, SVG-acute marginal, PDA, and posterior lateral branches of the right coronary, sequential SVG to the obtuse marginal vessel and distal circumflex.  Cardiac cath 2007 showing patent grafts.  Repeat cath March 2016 with grafts patent but 90% stenosis in SVG-OM which was stented with DES.  He continued to have chest pain and had a cholecystectomy 07/1694 without complications and without relief of pain.  CTA chest negative for PE or aortic pathology.  EGD showed Barrett's esophagus but no esophagitis.  Admitted December 2016 for syncope.  Echo LVEF 30 to 35% with moderate to severe aortic stenosis with mean gradient 24 mmHg.  EEG negative.  He had left bundle branch block.  He underwent placement of BiV ICD by Dr. Lovena Le.  Repeat echo with some increase in AV gradient.  He underwent dobutamine echo showing improvement in LVEF 40 to 45%.  He underwent uncomplicated TAVR 07/20/9379 via percutaneous right transfemoral approach due to mean gradient 41 mmHg severe aortic stenosis.  Treated with 26 mm Edwards SAPIEN Barnes transcatheter heart valve.  Repeat echo Barnes/2018 LVEF 40 to 45%.  Echo 02/2017 unchanged.  He continued to  have chest pain despite elevated forementioned interventions and was started on Imdur with some improvement.  He was noted to have atrial fibrillation based on pacer check.  He was started on Eliquis.  His echo 06/2020 showed worsening LVEF 25 to 30%.  He was evaluated in the clinic 12/2020 intended for outpatient cardiac catheterization.  Cardiac catheterization 12/26/2020 revealed 100% occluded ostial left main, 100% occluded ostial left circumflex artery, 100% occlusion to distal left main to mid LAD, 100% occluded ostial RCA, patent LIMA-LAD, occluded SVG to OM1 and OM Barnes which is new since 2018, severe stenosis seen sequential SVG to PDA and PL branch of RCA treated with single DES.  LVEDP moderately elevated and he received a dose of Lasix in the Cath Lab.  He did require additional IV Lasix postprocedure.  Echo 12/27/2020 LVEF 30%, normal PASP, TAVR valve present in the aorta.  Weight decreased by 10 pounds during admission.  Ramipril  and spironolactone were discontinued due to rising creatinine with plan to eventually transition to [facility] once renal function improved.  He was treated  with empiric antibiotic due to concern for UTI.  He was recommended for triple therapy aspirin Plavix, Eliquis for 1 month.  On follow up today he notes he still gets SOB with activity. Can't really do a lot. Notes chest pain improved from Dec but still uses a lot of Ntg - about 200 a month. Thinks a lot of his chest  pain is indigestion. No swelling. Weight is down 11 lbs. Main complaint is of SOB. Only gets dizzy if he stands to quick. Previously considered for Entresto but unable to afford. Apparently did not qualify for patient assistance. Same for Jardiance.   EKGs/Labs/Other Studies Reviewed:   The following studies were reviewed today:  Cath 12/26/2020   Ost LM to Dist LM lesion is 100% stenosed.   Ost Cx to Dist Cx lesion is 100% stenosed.   Dist LM to Mid LAD lesion is 100% stenosed.   Ost RCA to Prox RCA  lesion is 100% stenosed.   Origin to Prox Graft lesion before 1st Mrg  is 100% stenosed.   Origin to Prox Graft lesion before RPDA  is 95% stenosed.   A drug-eluting stent was successfully placed using a STENT ONYX FRONTIER 4.0X22.   Post intervention, there is a 20% residual stenosis.   LIMA graft was visualized by angiography and is normal in caliber.   SVG graft was visualized by angiography.   SVG graft was not visualized.   The graft exhibits no disease.   LV end diastolic pressure is moderately elevated.   Left main and RCA occlusive disease. The patient is graft dependent. Patent LIMA to the LAD Occluded SVG to OM1 and OM3. This is new since 2018. Some collaterals to OM3 Severe stenosis in sequential SVG to PDA and PL branches of the RCA. Also new since 2018. Moderately elevated LVEDP Successful PCI of the SVG to PDA and PL. Used distal protection and DES x 1. Lesion was quite resistant.    Plan: will observe overnight. Patient was given Brilinta in the lab but I would transition to Plavix in am since he is also on anticoagulation and cost is a major issue. Would give ASA for one month and Plavix for 12 months. May resume Eliquis tomorrow if no bleeding. Will give IV lasix x 1. Need to update Echo- this can be done as outpatient. Will again need to assess CHF therapy. Entresto and SGLT 2 inhibitors have been too expensive. Could consider aldactone.      Echo 12/27/2020  1. Left ventricular ejection fraction by 2D MOD biplane is 30.6 %. Left  ventricular diastolic parameters are consistent with Grade I diastolic  dysfunction (impaired relaxation).   2. Right ventricular systolic function was not well visualized. There is  normal pulmonary artery systolic pressure.   Barnes. Mild paravalvular leak is present, mildy worsened from prior echo  06/25/2020. No dehiscence. AV peak velocity 1.4 m/s; peak gradient 8.5  mmHg. Aortic valve regurgitation is trivial. There is a 26 mm Sapien   prosthetic (TAVR) valve present in the aortic   position. Procedure Date: 02/18/2016.   EKG:  No EKG today.   Recent Labs: 07/11/2020: ALT 21 12/26/2020: Magnesium 1.9 12/27/2020: B Natriuretic Peptide 1,329.5 1/Barnes/2023: NT-Pro BNP 8,820 01/24/2021: Hemoglobin 11.9; Platelets 259 01/31/2021: BUN 25; Creatinine, Ser 1.41; Potassium 4.6; Sodium 140  Recent Lipid Panel    Component Value Date/Time   CHOL 120 07/11/2020 1015   TRIG 65 07/11/2020 1015   HDL 41 07/11/2020 1015   CHOLHDL  2.9 07/11/2020 1015   CHOLHDL Barnes.9 03/12/2015 0932   VLDL 21 03/12/2015 0932   LDLCALC 65 07/11/2020 1015    Risk Assessment/Calculations:   CHA2DS2-VASc Score = 5 This indicates a 7.2% annual risk of stroke. The patient's score is based upon: CHF History: 1 HTN History: 1 Diabetes History: 0 Stroke History: 0 Vascular Disease History: 1 Age Score: 2 Gender Score: 0    Home Medications   Current Meds  Medication Sig   apixaban (ELIQUIS) 5 MG TABS tablet Take 1 tablet (5 mg total) by mouth 2 (two) times daily.   Ascorbic Acid (VITAMIN C) 500 MG CAPS Take 500 mg by mouth daily.   atorvastatin (LIPITOR) 80 MG tablet Take 1 tablet (80 mg total) by mouth daily.   Cholecalciferol (VITAMIN D-Barnes) 5000 units TABS Take 5,000 Units by mouth daily.    clopidogrel (PLAVIX) 75 MG tablet Take 1 tablet (75 mg total) by mouth daily.   finasteride (PROSCAR) 5 MG tablet Take 5 mg by mouth daily.   furosemide (LASIX) 20 MG tablet Take 1 tablet (20 mg total) by mouth daily as needed.   isosorbide mononitrate (IMDUR) 120 MG 24 hr tablet Take 1 tablet (120 mg total) by mouth daily.   losartan (COZAAR) 25 MG tablet Take 1 tablet (25 mg total) by mouth daily.   metoprolol succinate (TOPROL-XL) 50 MG 24 hr tablet TAKE ONE TABLET BY MOUTH DAILY WITH OR IMMEDIATELY FOLLOWING A MEAL   nitroGLYCERIN (NITROSTAT) 0.4 MG SL tablet DISSOLVE 1 TABLET UNDER TONGUE FOR CHEST PAIN - IF PAIN REMAINS AFTER 5 MIN, CALL 911 AND REPEAT  DOSE. MAX Barnes TABLETS IN 15 MINUTES FOR 30 DAY SUPPLY   oxybutynin (DITROPAN) 5 MG tablet Take 5 mg by mouth daily.    pantoprazole (PROTONIX) 40 MG tablet Take 1 tablet (40 mg total) by mouth daily.   ranolazine (RANEXA) 500 MG 12 hr tablet Take 1 tablet (500 mg total) by mouth 2 (two) times daily.   spironolactone (ALDACTONE) 25 MG tablet Take 0.5 tablets (12.5 mg total) by mouth daily.   valACYclovir (VALTREX) 500 MG tablet TAKE ONE TABLET BY MOUTH DAILY AS NEEDED FOR OUTBREAK   vitamin B-12 (CYANOCOBALAMIN) 500 MCG tablet Take 500 mcg by mouth daily.   [DISCONTINUED] aspirin 81 MG chewable tablet Chew 1 tablet (81 mg total) by mouth daily.     Review of Systems      All other systems reviewed and are otherwise negative except as noted above.  Physical Exam    VS:  BP 115/61    Pulse (!) 58    Ht 5\' 10"  (1.778 m)    Wt 197 lb 12.8 oz (89.7 kg)    SpO2 98%    BMI 28.38 kg/m  , BMI Body mass index is 28.38 kg/m.  Wt Readings from Last Barnes Encounters:  02/25/21 197 lb 12.8 oz (89.7 kg)  01/24/21 208 lb 6.4 oz (94.5 kg)  01/14/21 208 lb 6.4 oz (94.5 kg)    GEN: Well nourished, well developed, in no acute distress. HEENT: normal. Neck: Supple, no JVD, carotid bruits, or masses. Cardiac: RRR, no murmurs, rubs, or gallops. No clubbing, cyanosis, edema.  Radials/PT 2+ and equal bilaterally.  Respiratory:  Respirations regular and unlabored, clear to auscultation bilaterally. GI: Soft, nontender, nondistended. MS: No deformity or atrophy. Skin: Warm and dry, no rash. Neuro:  Strength and sensation are intact. Psych: Normal affect.  Assessment & Plan    Chronic systolic heart failure /  ICM - last EF 30%. Currently class Barnes. S/p revascularization in Dec. Volume status looks very good. Still trying to optimize medical therapy. Now on Toprol, aldactone, lasix. Will start losartan 25 mg daily. Arrange follow up with Pharm D to look at options for Entresto or SGLT 2 inhibitor. Did not qualify  for patient assistance for Entresto. ? If there are funds from foundation available. Will plan to repeat BMET and pro BNP in one month. Previously considered for Barostim trial but BNP was too high. Will update Echo in one month.    PAF / chronic anticoagulation - Denies palpitations. Denies bleeding complications on Eliquis. CHA2DS2-VASc Score = 5 [CHF History: 1, HTN History: 1, Diabetes History: 0, Stroke History: 0, Vascular Disease History: 1, Age Score: 2, Gender Score: 0].  Therefore, the patient's annual risk of stroke is 7.2 %.     HLD, LDL goal <70 - Continue Atorvastatin 80mg  QD. Denies myalgias.   CAD - stable anginal symptoms. S/p stent of SVG to PDA in Dec.   GDMT includes Plavix, Imdur, Ranexa, metoprolol, as needed nitroglycerin.  Continue Plavix for 1 year from stent 12/27/20 (PCI of SVG-PDA and PL).  S/p CRT/ICD - Continue to follow with EP. Presently not candidate for Barostim due to elevated BNP. Increasing optimization of heart failure therapy, as detailed above.  Hopeful to be able to improve BNP.    Severe AS S/p TAVR - Continue to monitor with periodic echo. Most recent echo 12/27/2020 with trivial AI, mild perivalvular leak, AV peak velocity 1.4 m/s and peak gradient 8.5 mmHg.   Disposition: Follow up in 4 weeks with lab and Echo.  Signed, Takota Cahalan [name], Barnes 02/25/2021, 10:40 AM [facility]

## 2021-02-25 ENCOUNTER — Ambulatory Visit: Payer: Medicare HMO | Admitting: Cardiology

## 2021-02-25 ENCOUNTER — Encounter: Payer: Self-pay | Admitting: Cardiology

## 2021-02-25 ENCOUNTER — Other Ambulatory Visit: Payer: Self-pay

## 2021-02-25 VITALS — BP 115/61 | HR 58 | Ht 70.0 in | Wt 197.8 lb

## 2021-02-25 DIAGNOSIS — Z952 Presence of prosthetic heart valve: Secondary | ICD-10-CM

## 2021-02-25 DIAGNOSIS — I25118 Atherosclerotic heart disease of native coronary artery with other forms of angina pectoris: Secondary | ICD-10-CM | POA: Diagnosis not present

## 2021-02-25 DIAGNOSIS — I447 Left bundle-branch block, unspecified: Secondary | ICD-10-CM

## 2021-02-25 DIAGNOSIS — I5022 Chronic systolic (congestive) heart failure: Secondary | ICD-10-CM | POA: Diagnosis not present

## 2021-02-25 DIAGNOSIS — I10 Essential (primary) hypertension: Secondary | ICD-10-CM | POA: Diagnosis not present

## 2021-02-25 MED ORDER — LOSARTAN POTASSIUM 25 MG PO TABS: 25.0000 mg | ORAL_TABLET | Freq: Every day | ORAL | 3 refills | Status: DC

## 2021-02-25 NOTE — Patient Instructions (Addendum)
Add losartan 25 mg daily to your current therapy  Schedule appointment with Pharmacist to help with cost of medications   Follow up in one month with blood work and Echo.   Schedule follow up appointment after Echo

## 2021-03-17 NOTE — Progress Notes (Signed)
Electrophysiology Office Note Date: 03/24/2021  ID:  [name], DOB 1945/06/24, MRN [id number]  PCP: Janeann Merl, MD Primary Cardiologist: Peter [name], MD Electrophysiologist: [name], MD   CC: Routine ICD follow-up  [name] is a 76 y.o. male seen today for [name], MD for routine electrophysiology followup.  Since last being seen in our clinic the patient reports doing OK. Continues to have SOB with more than ADLs. He reports a fainting spell several weeks OK after being started on a medication. Chart review makes it look like Losartan, but he was convinced it was spironolactone. He understands he is NOT a candidate for Barostim both by   he denies chest pain, palpitations, dyspnea, PND, orthopnea, nausea, vomiting, dizziness, syncope, edema, weight gain, or early satiety. He has not had ICD shocks.   Device History: Medtronic BiV ICD implanted 12/2014 for ICM/CHF  Past Medical History:  Diagnosis Date   Aortic stenosis    Barrett's esophageal ulceration    Chronic systolic CHF (congestive heart failure) ([place])    Coronary artery disease    a. CABG 1996: LIMA graft to the LAD and diagonal, sequential vein graft to the acute marginal, PDA, and posterior lateral branches of the right coronary, sequential saphenous vein graft to the obtuse marginal vessel and distal circumflex. b. Cath 2007 patent grafts. c. s/p DES to SVG-OM2 in 03/2014.   Coronary artery disease involving autologous vein coronary bypass graft with angina pectoris ([place]) 03/13/2014   PCI using DES in SVG to LCx system   Dental caries    pre-heart valve surgery protocol   Gastroesophageal reflux disease    History of erectile dysfunction    History of hiatal hernia    History of kidney stones    History of obesity    History of prostatitis    Hypercholesterolemia    Hypertension    Kidney stones    LBBB (left bundle branch block)    Nodule of kidney 02/04/2016   Incidental 33m nodule  upper pole right kidney noted on CT angiogram - MRI with and without gadolinium contrast recommened in 6 months   OSA (obstructive sleep apnea)    "suppose to wear mask; I don't" (03/13/2014)   PVC's (premature ventricular contractions)    S/P CABG x 7 01/07/1995   LIMA to LAD-diagonal, SVG to OM-LCx, SVG to AM-PD-RPL   S/P TAVR (transcatheter aortic valve replacement) 02/18/2016   26 mm Edwards Sapien 3 transcatheter heart valve placed via percutaneous right transfemoral approach   Past Surgical History:  Procedure Laterality Date   [place] ~ 2006   ; Ejection fraction is estimated at 45%   CARDIAC CATHETERIZATION N/A 01/22/2016   Procedure: Right/Left Heart Cath and Coronary/Graft Angiography;  Surgeon: [name] JMartinique MD;  [facility];  Service: Cardiovascular;  Laterality: N/A;   CHOLECYSTECTOMY N/A 01/23/2014   Procedure: LAPAROSCOPIC CHOLECYSTECTOMY WITH INTRAOPERATIVE CHOLANGIOGRAM ;  Surgeon: HFanny Skates MD;  [place] [facility]  Service: General;  Laterality: N/A;   COLONOSCOPY W/ POLYPECTOMY     CORONARY ANGIOPLASTY WITH STENT PLACEMENT  03/13/2014   "1"   [place]  "CABG X 7"   CORONARY STENT INTERVENTION N/A 12/26/2020   Procedure: CORONARY STENT INTERVENTION;  Surgeon: JMartinique [name], MD;  [facility];  Service: Cardiovascular;  Laterality: N/A;   CYSTOSCOPY WITH STENT PLACEMENT     EP IMPLANTABLE DEVICE N/A 12/17/2014  Procedure: BiV ICD Insertion CRT-D;  Surgeon: Evans Lance, MD;  Location: [place] [facility];  Service: Cardiovascular;  Laterality: N/A;   LEFT HEART CATH AND CORS/GRAFTS ANGIOGRAPHY N/A 12/26/2020   Procedure: LEFT HEART CATH AND CORS/GRAFTS ANGIOGRAPHY;  Surgeon: [name], [name], MD;  Location: [place] [facility];  Service: Cardiovascular;  Laterality: N/A;   LEFT HEART CATHETERIZATION WITH CORONARY/GRAFT ANGIOGRAM N/A 03/13/2014   Procedure: LEFT HEART CATHETERIZATION WITH  Beatrix Fetters;  Surgeon: [name] [name], MD;  Location: [facility] C[facility];  Service: Cardiovascular;  Laterality: N/A;   MULTIPLE EXTRACTIONS WITH ALVEOLOPLASTY N/A 02/07/2016   Procedure: Extraction of tooth #'s 1,2,3,14,15, 18, 20, 29,30,31 with alveoloplasty and dental cleaning of teeth.;  Surgeon: Lenn Cal, DDS;  Location: [place];  Service: Oral Surgery;  Laterality: N/A;   TEE WITHOUT CARDIOVERSION N/A 02/18/2016   Procedure: TRANSESOPHAGEAL ECHOCARDIOGRAM (TEE);  Surgeon: Sherren Mocha, MD;  Location: [place];  Service: Open Heart Surgery;  Laterality: N/A;   TONSILLECTOMY  ~ 1950   TRANSCATHETER AORTIC VALVE REPLACEMENT, TRANSFEMORAL N/A 02/18/2016   Procedure: TRANSCATHETER AORTIC VALVE REPLACEMENT, TRANSFEMORAL;  Surgeon: Sherren Mocha, MD;  Location: [place];  Service: Open Heart Surgery;  Laterality: N/A;    Current Outpatient Medications  Medication Sig Dispense Refill   apixaban (ELIQUIS) 5 MG TABS tablet Take 1 tablet (5 mg total) by mouth 2 (two) times daily. 84 tablet 0   Ascorbic Acid (VITAMIN C) 500 MG CAPS Take 500 mg by mouth daily.     atorvastatin (LIPITOR) 80 MG tablet Take 1 tablet (80 mg total) by mouth daily. 90 tablet 3   Cholecalciferol (VITAMIN D-3) 5000 units TABS Take 5,000 Units by mouth daily.      clopidogrel (PLAVIX) 75 MG tablet Take 1 tablet (75 mg total) by mouth daily. 90 tablet 3   finasteride (PROSCAR) 5 MG tablet Take 5 mg by mouth daily.     furosemide (LASIX) 20 MG tablet Take 1 tablet (20 mg total) by mouth daily as needed. 30 tablet 3   isosorbide mononitrate (IMDUR) 120 MG 24 hr tablet Take 1 tablet (120 mg total) by mouth daily. 90 tablet 3   metoprolol succinate (TOPROL-XL) 50 MG 24 hr tablet TAKE ONE TABLET BY MOUTH DAILY WITH OR IMMEDIATELY FOLLOWING A MEAL 90 tablet 3   nitroGLYCERIN (NITROSTAT) 0.4 MG SL tablet DISSOLVE 1 TABLET UNDER TONGUE FOR CHEST PAIN - IF PAIN REMAINS AFTER 5 MIN, CALL 911 AND REPEAT DOSE. MAX 3 TABLETS IN 15  MINUTES FOR 30 DAY SUPPLY 200 tablet 6   oxybutynin (DITROPAN) 5 MG tablet Take 5 mg by mouth daily.      pantoprazole (PROTONIX) 40 MG tablet Take 1 tablet (40 mg total) by mouth daily. 90 tablet 1   ranolazine (RANEXA) 500 MG 12 hr tablet Take 1 tablet (500 mg total) by mouth 2 (two) times daily. 180 tablet 3   valACYclovir (VALTREX) 500 MG tablet TAKE ONE TABLET BY MOUTH DAILY AS NEEDED FOR OUTBREAK 30 tablet 0   vitamin B-12 (CYANOCOBALAMIN) 500 MCG tablet Take 500 mcg by mouth daily.     No current facility-administered medications for this visit.    Allergies:   Patient has no known allergies.   Social History: Social History   Socioeconomic History   Marital status: Married    Spouse name: Not on file   Number of children: 3   Years of education: Not on file   Highest education level: Not on  file  Occupational History   Occupation: Scientist, clinical (histocompatibility and immunogenetics): UNEMPLOYED    Comment: retired  Tobacco Use   Smoking status: Former    Packs/day: 0.75    Years: 15.00    Pack years: 11.25    Types: Cigarettes    Quit date: 01/12/1997    Years since quitting: 24.2   Smokeless tobacco: Never   Tobacco comments:    3/4 pack per day. smoked 15 years off and on  Vaping Use   Vaping Use: Never used  Substance and Sexual Activity   Alcohol use: No    Alcohol/week: 4.0 standard drinks    Types: 4 Cans of beer per week   Drug use: No   Sexual activity: Yes  Other Topics Concern   Not on file  Social History Narrative   Not on file   Social Determinants of Health   Financial Resource Strain: Not on file  Food Insecurity: Not on file  Transportation Needs: Not on file  Physical Activity: Not on file  Stress: Not on file  Social Connections: Not on file  Intimate Partner Violence: Not on file    Family History: Family History  Problem Relation Age of Onset   Heart attack Father    Cancer Sister        Uterine Cancer    Review of Systems: All other systems reviewed and  are otherwise negative except as noted above.   Physical Exam: Vitals:   03/24/21 1053  BP: 124/64  Pulse: 62  SpO2: 99%  Weight: 201 lb (91.2 kg)  Height: '5\' 10"'$  (1.778 m)     GEN- The patient is well appearing, alert and oriented x 3 today.   HEENT: normocephalic, atraumatic; sclera clear, conjunctiva pink; hearing intact; oropharynx clear; neck supple, no JVP Lymph- no cervical lymphadenopathy Lungs- Clear to ausculation bilaterally, normal work of breathing.  No wheezes, rales, rhonchi Heart- Regular rate and rhythm, no murmurs, rubs or gallops, PMI not laterally displaced GI- soft, non-tender, non-distended, bowel sounds present, no hepatosplenomegaly Extremities- no clubbing or cyanosis. No edema; DP/PT/radial pulses 2+ bilaterally MS- no significant deformity or atrophy Skin- warm and dry, no rash or lesion; ICD pocket well healed Psych- euthymic mood, full affect Neuro- strength and sensation are intact  ICD interrogation- reviewed in detail today,  See PACEART report  EKG:  EKG is not ordered today.  Recent Labs: 07/11/2020: ALT 21 12/26/2020: Magnesium 1.9 12/27/2020: B Natriuretic Peptide 1,329.5 01/14/2021: NT-Pro BNP 8,820 01/24/2021: Hemoglobin 11.9; Platelets 259 01/31/2021: BUN 25; Creatinine, Ser 1.41; Potassium 4.6; Sodium 140   Wt Readings from Last 3 Encounters:  03/24/21 201 lb (91.2 kg)  02/25/21 197 lb 12.8 oz (89.7 kg)  01/24/21 208 lb 6.4 oz (94.5 kg)     Other studies Reviewed: Additional studies/ records that were reviewed today include: Previous EP office notes.   Assessment and Plan:  1.  Chronic systolic dysfunction s/p Medtronic CRT-D  Echo shows LVEF ~40% with several different interpretations euvolemic today Stable on an appropriate medical regimen Normal ICD function See Pace Art report No changes today At last visit adjusted device to CRT settings to LV -> RV 50 ms from 20 ms. This showed the most benefit at current settings of LV4  to RV coil LV3 showed more wide QRS, LV1 produced stim at nearly all vectors at higher threshold. LV2 showed no significant improvement.  His regular BNP is over 8000. Pro BNP is 2-3 times higher, so he is  excluded from BAT consideration EF is > 40%, is is also excluded from BAT consideration on these grounds. He has marked losartan and spironolactone off of his list, feeling like one dropped his BP and caused a fainting spell. Nothing on device to correlate.  I recommended he confirm which med he is taking and which he has stopped and let us know.  I also recommend he bring all of his medication to his upcoming appointment with Dr. [name] for further clarification.     2. PAF Maintaining NSR for the most part.  Continue eliquis  Current medicines are reviewed at length with the patient today.    Disposition:   Follow up with EP APP in 6 months   Signed, Shirley Friar, PA-C  03/24/2021 11:08 AM  [facility] [address] [place] [address] [phone] (office) [phone] (fax)

## 2021-03-18 ENCOUNTER — Other Ambulatory Visit: Payer: Self-pay

## 2021-03-18 ENCOUNTER — Ambulatory Visit (HOSPITAL_COMMUNITY): Payer: Medicare HMO | Attending: Cardiovascular Disease

## 2021-03-18 DIAGNOSIS — I25118 Atherosclerotic heart disease of native coronary artery with other forms of angina pectoris: Secondary | ICD-10-CM | POA: Diagnosis not present

## 2021-03-18 DIAGNOSIS — Z952 Presence of prosthetic heart valve: Secondary | ICD-10-CM | POA: Diagnosis not present

## 2021-03-18 DIAGNOSIS — I5022 Chronic systolic (congestive) heart failure: Secondary | ICD-10-CM

## 2021-03-18 DIAGNOSIS — I10 Essential (primary) hypertension: Secondary | ICD-10-CM | POA: Diagnosis not present

## 2021-03-18 DIAGNOSIS — I447 Left bundle-branch block, unspecified: Secondary | ICD-10-CM | POA: Insufficient documentation

## 2021-03-18 LAB — ECHOCARDIOGRAM COMPLETE
AR max vel: 1.61 cm2
AV Area VTI: 1.57 cm2
AV Area mean vel: 1.46 cm2
AV Mean grad: 7 mmHg
AV Peak grad: 13.5 mmHg
Ao pk vel: 1.84 m/s
Area-P 1/2: 4.12 cm2
S' Lateral: 5.5 cm

## 2021-03-24 ENCOUNTER — Ambulatory Visit: Payer: Medicare HMO | Admitting: Student

## 2021-03-24 ENCOUNTER — Other Ambulatory Visit: Payer: Self-pay

## 2021-03-24 ENCOUNTER — Encounter: Payer: Self-pay | Admitting: Student

## 2021-03-24 VITALS — BP 124/64 | HR 62 | Ht 70.0 in | Wt 201.0 lb

## 2021-03-24 DIAGNOSIS — I480 Paroxysmal atrial fibrillation: Secondary | ICD-10-CM | POA: Diagnosis not present

## 2021-03-24 DIAGNOSIS — I5022 Chronic systolic (congestive) heart failure: Secondary | ICD-10-CM

## 2021-03-24 LAB — CUP PACEART INCLINIC DEVICE CHECK
Date Time Interrogation Session: 20230313120123
Implantable Lead Implant Date: 20161205
Implantable Lead Implant Date: 20161205
Implantable Lead Implant Date: 20161205
Implantable Lead Location: 753858
Implantable Lead Location: 753859
Implantable Lead Location: 753860
Implantable Lead Model: 4598
Implantable Lead Model: 5076
Implantable Lead Model: 6935
Implantable Pulse Generator Implant Date: 20161205

## 2021-03-24 NOTE — Patient Instructions (Signed)

## 2021-03-26 ENCOUNTER — Other Ambulatory Visit: Payer: Self-pay

## 2021-03-26 DIAGNOSIS — R0602 Shortness of breath: Secondary | ICD-10-CM

## 2021-03-26 DIAGNOSIS — I5022 Chronic systolic (congestive) heart failure: Secondary | ICD-10-CM

## 2021-04-02 DIAGNOSIS — R0602 Shortness of breath: Secondary | ICD-10-CM | POA: Diagnosis not present

## 2021-04-02 DIAGNOSIS — I5022 Chronic systolic (congestive) heart failure: Secondary | ICD-10-CM | POA: Diagnosis not present

## 2021-04-03 LAB — PRO B NATRIURETIC PEPTIDE: NT-Pro BNP: 8204 pg/mL — ABNORMAL HIGH (ref 0–486)

## 2021-04-04 NOTE — Progress Notes (Signed)
? ?Office Visit  ?  ?Patient Name: [name] ?Date of Encounter: 04/09/2021 ? ?PCP:  Janeann Merl, MD ?  ?[place]  ?Cardiologist:  Maddix Heinz [name], MD  ?Advanced Practice Provider:  No care team member to display ?Electrophysiologist:  [name], MD  ?   ?Chief Complaint  ?  ?[name] is a 76 y.o. male with a hx of left bundle branch block, coronary artery disease s/p CABG and previous PCI, CHF, hyperlipidemia, hypertension presents today for follow-up of CHF ? ?Past Medical History  ?  ?Past Medical History:  ?Diagnosis Date  ? Aortic stenosis   ? Barrett's esophageal ulceration   ? Chronic systolic CHF (congestive heart failure) ([place])   ? Coronary artery disease   ? a. CABG 1996: LIMA graft to the LAD and diagonal, sequential vein graft to the acute marginal, PDA, and posterior lateral branches of the right coronary, sequential saphenous vein graft to the obtuse marginal vessel and distal circumflex. b. Cath 2007 patent grafts. c. s/p DES to SVG-OM2 in 03/2014.  ? Coronary artery disease involving autologous vein coronary bypass graft with angina pectoris ([place]) 03/13/2014  ? PCI using DES in SVG to LCx system  ? Dental caries   ? pre-heart valve surgery protocol  ? Gastroesophageal reflux disease   ? History of erectile dysfunction   ? History of hiatal hernia   ? History of kidney stones   ? History of obesity   ? History of prostatitis   ? Hypercholesterolemia   ? Hypertension   ? Kidney stones   ? LBBB (left bundle branch block)   ? Nodule of kidney 02/04/2016  ? Incidental 45m nodule upper pole right kidney noted on CT angiogram - MRI with and without gadolinium contrast recommened in 6 months  ? OSA (obstructive sleep apnea)   ? "suppose to wear mask; I don't" (03/13/2014)  ? PVC's (premature ventricular contractions)   ? S/P CABG x 7 01/07/1995  ? LIMA to LAD-diagonal, SVG to OM-LCx, SVG to AM-PD-RPL  ? S/P TAVR (transcatheter aortic valve replacement) 02/18/2016  ? 26  mm Edwards Sapien 3 transcatheter heart valve placed via percutaneous right transfemoral approach  ? ?Past Surgical History:  ?Procedure Laterality Date  ? [place] ~ 2006  ? ; Ejection fraction is estimated at 45%  ? CARDIAC CATHETERIZATION N/A 01/22/2016  ? Procedure: Right/Left Heart Cath and Coronary/Graft Angiography;  Surgeon: Titilayo Hagans M JMartinique MD;  [facility];  Service: Cardiovascular;  Laterality: N/A;  ? CHOLECYSTECTOMY N/A 01/23/2014  ? Procedure: LAPAROSCOPIC CHOLECYSTECTOMY WITH INTRAOPERATIVE CHOLANGIOGRAM ;  Surgeon: HFanny Skates MD;  [place]: [place]  Service: General;  Laterality: N/A;  ? COLONOSCOPY W/ POLYPECTOMY    ? CORONARY ANGIOPLASTY WITH STENT PLACEMENT  03/13/2014  ? "1"  ? CORONARY ARTERY BYPASS GRAFT  1996  ? "CABG X 7"  ? CORONARY STENT INTERVENTION N/A 12/26/2020  ? Procedure: CORONARY STENT INTERVENTION;  Surgeon: JMartinique Chazz Philson M, MD;  [facility];  Service: Cardiovascular;  Laterality: N/A;  ? CYSTOSCOPY WITH STENT PLACEMENT    ? EP IMPLANTABLE DEVICE N/A 12/17/2014  ? Procedure: BiV ICD Insertion CRT-D;  Surgeon: GEvans Lance MD;  [facility];  Service: Cardiovascular;  Laterality: N/A;  ? LEFT HEART CATH AND CORS/GRAFTS ANGIOGRAPHY N/A 12/26/2020  ? Procedure: LEFT HEART CATH AND CORS/GRAFTS ANGIOGRAPHY;  Surgeon: JMartinique Danyelle Brookover M, MD;  [facility];  Service: Cardiovascular;  Laterality: N/A;  ? LEFT HEART CATHETERIZATION WITH CORONARY/GRAFT ANGIOGRAM N/A 03/13/2014  ? Procedure: LEFT HEART CATHETERIZATION WITH Beatrix Fetters;  Surgeon: Adien Kimmel M [name], MD;  Location: [facility] CATH LAB;  Service: Cardiovascular;  Laterality: N/A;  ? MULTIPLE EXTRACTIONS WITH ALVEOLOPLASTY N/A 02/07/2016  ? Procedure: Extraction of tooth #'s 1,2,3,14,15, 18, 20, 29,30,31 with alveoloplasty and dental cleaning of teeth.;  Surgeon: Lenn Cal, DDS;  Location: [place];  Service: Oral Surgery;  Laterality: N/A;   ? TEE WITHOUT CARDIOVERSION N/A 02/18/2016  ? Procedure: TRANSESOPHAGEAL ECHOCARDIOGRAM (TEE);  Surgeon: Sherren Mocha, MD;  Location: [place];  Service: Open Heart Surgery;  Laterality: N/A;  ? TONSILLECTOMY  ~ 1950  ? TRANSCATHETER AORTIC VALVE REPLACEMENT, TRANSFEMORAL N/A 02/18/2016  ? Procedure: TRANSCATHETER AORTIC VALVE REPLACEMENT, TRANSFEMORAL;  Surgeon: Sherren Mocha, MD;  Location: [place];  Service: Open Heart Surgery;  Laterality: N/A;  ? ? ?Allergies ? ?No Known Allergies ? ?History of Present Illness  ?  ?[name] is a 76 y.o. male with a hx of left bundle branch block, coronary artery disease s/p CABG and previous PCI, CHF, hyperlipidemia, hypertension last seen 01/14/2021 by Oda Kilts, PA. ? ?He has known coronary artery disease with prior CABG in 1996 including LIMA-LAD and diagonal, SVG-acute marginal, PDA, and posterior lateral branches of the right coronary, sequential SVG to the obtuse marginal vessel and distal circumflex.  Cardiac cath 2007 showing patent grafts.  Repeat cath March 2016 with grafts patent but 90% stenosis in SVG-OM which was stented with DES.  He continued to have chest pain and had a cholecystectomy 06/1605 without complications and without relief of pain.  CTA chest negative for PE or aortic pathology.  EGD showed Barrett's esophagus but no esophagitis. ? ?Admitted December 2016 for syncope.  Echo LVEF 30 to 35% with moderate to severe aortic stenosis with mean gradient 24 mmHg.  EEG negative.  He had left bundle branch block.  He underwent placement of BiV ICD by Dr. Lovena Le.  Repeat echo with some increase in AV gradient.  He underwent dobutamine echo showing improvement in LVEF 40 to 45%.  He underwent uncomplicated TAVR 03/18/1060 via percutaneous right transfemoral approach due to mean gradient 41 mmHg severe aortic stenosis.  Treated with 26 mm Edwards SAPIEN 3 transcatheter heart valve.  Repeat echo 03/2016 LVEF 40 to 45%.  Echo 02/2017 unchanged. ? ?He continued to  have chest pain despite elevated forementioned interventions and was started on Imdur with some improvement. ? ?He was noted to have atrial fibrillation based on pacer check.  He was started on Eliquis.  His echo 06/2020 showed worsening LVEF 25 to 30%.  He was evaluated in the clinic 12/2020 intended for outpatient cardiac catheterization.  Cardiac catheterization 12/26/2020 revealed 100% occluded ostial left main, 100% occluded ostial left circumflex artery, 100% occlusion to distal left main to mid LAD, 100% occluded ostial RCA, patent LIMA-LAD, occluded SVG to OM1 and OM 3 which is new since 2018, severe stenosis seen sequential SVG to PDA and PL branch of RCA treated with single DES.  LVEDP moderately elevated and he received a dose of Lasix in the Cath Lab.  He did require additional IV Lasix postprocedure.  Echo 12/27/2020 LVEF 30%, normal PASP, TAVR valve present in the aorta.  Weight decreased by 10 pounds during admission.  Ramipril  and spironolactone were discontinued due to rising creatinine with plan to eventually transition to [facility] once renal function improved.  He was treated  with empiric antibiotic due to concern for UTI.  He was recommended for triple therapy aspirin Plavix, Eliquis for 1 month. ? ?On follow up last month he noted he still gets SOB with activity. Can't really do a lot. Notes chest pain improved from Dec but still uses a lot of Ntg - about 200 a month. Thinks a lot of his chest  pain is indigestion. No swelling. Weight is down 21 lbs since before hospitalization. Previously considered for Entresto but unable to afford. Apparently did not qualify for patient assistance. Same for Jardiance. We prescribed losartan at 25 mg daily but this made him dizzy and he actually passed out at [facility].  We repeated pro BNP which was still high so did not qualify for Barostim trial. Echo was repeated- see my note below.  ? ?EKGs/Labs/Other Studies Reviewed:  ? ?The following studies were reviewed  today: ? ?Cath 12/26/2020 ?  Ost LM to Dist LM lesion is 100% stenosed. ?  Ost Cx to Dist Cx lesion is 100% stenosed. ?  Dist LM to Mid LAD lesion is 100% stenosed. ?  Ost RCA to Prox RCA lesion is 100% s

## 2021-04-09 ENCOUNTER — Ambulatory Visit: Payer: Medicare HMO | Admitting: Cardiology

## 2021-04-09 ENCOUNTER — Other Ambulatory Visit: Payer: Self-pay

## 2021-04-09 VITALS — BP 138/78 | HR 60 | Ht 70.0 in | Wt 192.2 lb

## 2021-04-09 DIAGNOSIS — I447 Left bundle-branch block, unspecified: Secondary | ICD-10-CM | POA: Diagnosis not present

## 2021-04-09 DIAGNOSIS — I5022 Chronic systolic (congestive) heart failure: Secondary | ICD-10-CM | POA: Diagnosis not present

## 2021-04-09 DIAGNOSIS — Z952 Presence of prosthetic heart valve: Secondary | ICD-10-CM

## 2021-04-09 DIAGNOSIS — I25118 Atherosclerotic heart disease of native coronary artery with other forms of angina pectoris: Secondary | ICD-10-CM

## 2021-04-09 DIAGNOSIS — E785 Hyperlipidemia, unspecified: Secondary | ICD-10-CM

## 2021-04-28 LAB — BASIC METABOLIC PANEL
BUN/Creatinine Ratio: 17 (ref 10–24)
BUN: 24 mg/dL (ref 8–27)
CO2: 17 mmol/L — ABNORMAL LOW (ref 20–29)
Calcium: 9.5 mg/dL (ref 8.6–10.2)
Chloride: 101 mmol/L (ref 96–106)
Creatinine, Ser: 1.41 mg/dL — ABNORMAL HIGH (ref 0.76–1.27)
Glucose: 119 mg/dL — ABNORMAL HIGH (ref 70–99)
Potassium: 4.7 mmol/L (ref 3.5–5.2)
Sodium: 140 mmol/L (ref 134–144)
eGFR: 51 mL/min/{1.73_m2} — ABNORMAL LOW (ref >59)

## 2021-04-28 LAB — SPECIMEN STATUS REPORT

## 2021-05-08 ENCOUNTER — Ambulatory Visit (INDEPENDENT_AMBULATORY_CARE_PROVIDER_SITE_OTHER): Payer: Medicare HMO

## 2021-05-08 DIAGNOSIS — I214 Non-ST elevation (NSTEMI) myocardial infarction: Secondary | ICD-10-CM | POA: Diagnosis not present

## 2021-05-08 LAB — CUP PACEART REMOTE DEVICE CHECK
Battery Remaining Longevity: 11 mo
Battery Voltage: 2.86 V
Brady Statistic AP VP Percent: 58.89 %
Brady Statistic AP VS Percent: 0.04 %
Brady Statistic AS VP Percent: 40.78 %
Brady Statistic AS VS Percent: 0.29 %
Brady Statistic RA Percent Paced: 58.16 %
Brady Statistic RV Percent Paced: 98.99 %
Date Time Interrogation Session: 20230427001704
HighPow Impedance: 65 Ohm
Implantable Lead Implant Date: 20161205
Implantable Lead Implant Date: 20161205
Implantable Lead Implant Date: 20161205
Implantable Lead Location: 753858
Implantable Lead Location: 753859
Implantable Lead Location: 753860
Implantable Lead Model: 4598
Implantable Lead Model: 5076
Implantable Lead Model: 6935
Implantable Pulse Generator Implant Date: 20161205
Lead Channel Impedance Value: 304 Ohm
Lead Channel Impedance Value: 342 Ohm
Lead Channel Impedance Value: 342 Ohm
Lead Channel Impedance Value: 342 Ohm
Lead Channel Impedance Value: 361 Ohm
Lead Channel Impedance Value: 361 Ohm
Lead Channel Impedance Value: 399 Ohm
Lead Channel Impedance Value: 456 Ohm
Lead Channel Impedance Value: 532 Ohm
Lead Channel Impedance Value: 551 Ohm
Lead Channel Impedance Value: 589 Ohm
Lead Channel Impedance Value: 589 Ohm
Lead Channel Impedance Value: 608 Ohm
Lead Channel Pacing Threshold Amplitude: 0.625 V
Lead Channel Pacing Threshold Amplitude: 0.625 V
Lead Channel Pacing Threshold Amplitude: 0.75 V
Lead Channel Pacing Threshold Pulse Width: 0.4 ms
Lead Channel Pacing Threshold Pulse Width: 0.4 ms
Lead Channel Pacing Threshold Pulse Width: 0.4 ms
Lead Channel Sensing Intrinsic Amplitude: 1.5 mV
Lead Channel Sensing Intrinsic Amplitude: 1.5 mV
Lead Channel Sensing Intrinsic Amplitude: 10.375 mV
Lead Channel Sensing Intrinsic Amplitude: 15.375 mV
Lead Channel Setting Pacing Amplitude: 1.75 V
Lead Channel Setting Pacing Amplitude: 2 V
Lead Channel Setting Pacing Amplitude: 2.5 V
Lead Channel Setting Pacing Pulse Width: 0.4 ms
Lead Channel Setting Pacing Pulse Width: 0.4 ms
Lead Channel Setting Sensing Sensitivity: 0.3 mV

## 2021-05-20 ENCOUNTER — Other Ambulatory Visit: Payer: Self-pay | Admitting: Urology

## 2021-05-23 NOTE — Progress Notes (Signed)
Remote ICD transmission.   

## 2021-06-18 DIAGNOSIS — R351 Nocturia: Secondary | ICD-10-CM | POA: Diagnosis not present

## 2021-06-18 DIAGNOSIS — N401 Enlarged prostate with lower urinary tract symptoms: Secondary | ICD-10-CM | POA: Diagnosis not present

## 2021-06-18 DIAGNOSIS — R3915 Urgency of urination: Secondary | ICD-10-CM | POA: Diagnosis not present

## 2021-06-23 ENCOUNTER — Telehealth: Payer: Self-pay | Admitting: Cardiology

## 2021-06-23 MED ORDER — FUROSEMIDE 20 MG PO TABS: 20.0000 mg | ORAL_TABLET | Freq: Every day | ORAL | 3 refills | Status: DC | PRN

## 2021-06-23 NOTE — Telephone Encounter (Signed)
It was losartan that caused him to pass out at [facility]. It sounds to me that he is retaining fluid. I would recommend he start taking lasix daily. If his swelling goes away he can resume taking it PRN. If he has no improvement with taking the lasix I would schedule him an office visit.  Jaycub Noorani [name] MD, [facility]

## 2021-06-23 NOTE — Telephone Encounter (Signed)
Left message to call back  

## 2021-06-23 NOTE — Telephone Encounter (Signed)
Stated he had some questions, talking about previous stent in Dec 2022 then Echo with EF 35-40 % in March 2023.   He is complaining of SOB when walking short distances and at rest as well. This is accompanied by bilateral LE swelling. No chest pain. He has Lasix 20 mg QD PRN. He has not taken it because when he took it in the past he fell at [facility] because it made him so dizzy and noted that he passed out at that time. He is wanting to know what else he can take. He did not call the office when this occurred, but said he told Dr. [name] about it at a later office visit. The patients weight is 188 lbs this morning stating he hasn't really had much weight gain and feels like he has lost weight. In March he weighed around 210 lbs and now 188 lbs. He is on the same diet but he has been eating less. Holding pressure on his feet there is pitting edema left behind. He also feels like he is having some panic attacks that could be related to the breathing problem. No missed doses of medication. He is complaining of random nose bleeds having 4-5 episodes since December and takes about an hour to get it to stop; thought he should let the MD know. Advised about preventive measures for nose bleeds. He does not take his VS at home. Gave ED precautions. Patient verbalized understanding.   Will forward to MD for advisement.

## 2021-06-23 NOTE — Telephone Encounter (Signed)
Patient is requesting to speak with Dr. Doug Sou nurse regarding prescriptions and another inquiry. He states he will discuss further when he speaks with the nurse.

## 2021-06-23 NOTE — Telephone Encounter (Signed)
Pt is returning call and requesting call back.  

## 2021-06-23 NOTE — Telephone Encounter (Signed)
Spoke with pt regarding Dr. Doug Sou recommendations. New prescription for lasix sent to pharmacy of choice. Pt verbalizes understand and will let us know if medication does not improve swelling.

## 2021-07-01 ENCOUNTER — Telehealth: Payer: Self-pay | Admitting: Cardiology

## 2021-07-01 NOTE — Telephone Encounter (Signed)
Returned call to [facility] they filled 200 tablets of ntg 24 days ago and patient is requesting a refill.  She is concerned that he is taking this many ntg in a short time frame.    Called patient to discuss- he states he is doing well, no change in symptoms since Ov in March with Dr. [name].  Reports he does not take ntg daily but request refills sooner than needed as it takes time to get it filled.  Reports chest pain has decreased in frequency since OV (states he has lost weight).   He also reports he has a hiatal hernia that he believes his symptoms come from as well.  He reports talking to Dr. [name] about this and Dr. [name] was ok with 200 NTG a month and was not concerned.    OV 3/29 with Dr. Darene Lamer to be taking 200 ntg per month.  Rx is for 200 tablets per 30 days.    Advised to let us know is symptoms change or increases in frequency.  Will route to MD to confirm/make aware.

## 2021-07-01 NOTE — Telephone Encounter (Signed)
Pt c/o medication issue:  1. Name of Medication:  nitroGLYCERIN (NITROSTAT) 0.4 MG SL tablet  2. How are you currently taking this medication (dosage and times per day)?   3. Are you having a reaction (difficulty breathing--STAT)?   4. What is your medication issue?   [name] with [facility] is requesting clarification on medication instructions. She also mentions the patient has been contacting them about every 20 days for nitro refills. She states he has been taking 6-7 daily and she is worried he has not been calling 911. Please return call to discuss.

## 2021-07-02 NOTE — Telephone Encounter (Signed)
Agree. He uses a lot of sl Ntg chronically and this is Ok  Marquest Gunkel [name] MD, [facility]

## 2021-07-03 NOTE — Telephone Encounter (Signed)
Spoke with patient and advised that [name] had already been contacted Says he would call them back

## 2021-07-03 NOTE — Telephone Encounter (Signed)
° °  Pt is calling back to f/u °

## 2021-07-03 NOTE — Telephone Encounter (Signed)
Patient and pharmacist ([facility]) informed  [name] pharmacist ([facility]) request call back from Dr. [name] to discuss

## 2021-07-03 NOTE — Telephone Encounter (Signed)
Pt returning call requesting to speak to nurse stating pharmacy called him requesting more information. Transferred to Georgina Peer, RN.

## 2021-07-23 DIAGNOSIS — G629 Polyneuropathy, unspecified: Secondary | ICD-10-CM | POA: Diagnosis not present

## 2021-07-23 DIAGNOSIS — M2041 Other hammer toe(s) (acquired), right foot: Secondary | ICD-10-CM | POA: Diagnosis not present

## 2021-07-23 DIAGNOSIS — Q667 Congenital pes cavus, unspecified foot: Secondary | ICD-10-CM | POA: Diagnosis not present

## 2021-07-23 DIAGNOSIS — L851 Acquired keratosis [keratoderma] palmaris et plantaris: Secondary | ICD-10-CM | POA: Diagnosis not present

## 2021-07-23 DIAGNOSIS — D492 Neoplasm of unspecified behavior of bone, soft tissue, and skin: Secondary | ICD-10-CM | POA: Diagnosis not present

## 2021-07-23 DIAGNOSIS — M792 Neuralgia and neuritis, unspecified: Secondary | ICD-10-CM | POA: Diagnosis not present

## 2021-08-05 NOTE — Progress Notes (Signed)
Office Visit    Patient Name: [name] Date of Encounter: 08/11/2021  PCP:  Preston Merl, MD   [place]  Cardiologist:  Preston Ottaway [name], MD  Advanced Practice Provider:  No care team member to display Electrophysiologist:  Preston Peru, MD     Chief Complaint    [name] is a 76 y.o. male with a hx of left bundle branch block, coronary artery disease s/p CABG and previous PCI, CHF, hyperlipidemia, hypertension presents today for follow-up of CHF  Past Medical History    Past Medical History:  Diagnosis Date   Aortic stenosis    Barrett's esophageal ulceration    Chronic systolic CHF (congestive heart failure) ([place])    Coronary artery disease    a. CABG 1996: LIMA graft to the LAD and diagonal, sequential vein graft to the acute marginal, PDA, and posterior lateral branches of the right coronary, sequential saphenous vein graft to the obtuse marginal vessel and distal circumflex. b. Cath 2007 patent grafts. c. s/p DES to SVG-OM2 in 03/2014.   Coronary artery disease involving autologous vein coronary bypass graft with angina pectoris ([place]) 03/13/2014   PCI using DES in SVG to LCx system   Dental caries    pre-heart valve surgery protocol   Gastroesophageal reflux disease    History of erectile dysfunction    History of hiatal hernia    History of kidney stones    History of obesity    History of prostatitis    Hypercholesterolemia    Hypertension    Kidney stones    LBBB (left bundle branch block)    Nodule of kidney 02/04/2016   Incidental 22m nodule upper pole right kidney noted on CT angiogram - MRI with and without gadolinium contrast recommened in 6 months   OSA (obstructive sleep apnea)    "suppose to wear mask; I don't" (03/13/2014)   PVC's (premature ventricular contractions)    S/P CABG x 7 01/07/1995   LIMA to LAD-diagonal, SVG to OM-LCx, SVG to AM-PD-RPL   S/P TAVR (transcatheter aortic valve replacement) 02/18/2016   26  mm Edwards Sapien 3 transcatheter heart valve placed via percutaneous right transfemoral approach   Past Surgical History:  Procedure Laterality Date   [place] 2006   ; Ejection fraction is estimated at 45%   CARDIAC CATHETERIZATION N/A 01/22/2016   Procedure: Right/Left Heart Cath and Coronary/Graft Angiography;  Surgeon: Preston Borden Barnes JMartinique MD;  [facility];  Service: Cardiovascular;  Laterality: N/A;   CHOLECYSTECTOMY N/A 01/23/2014   Procedure: LAPAROSCOPIC CHOLECYSTECTOMY WITH INTRAOPERATIVE CHOLANGIOGRAM ;  Surgeon: HFanny Skates MD;  [place]: [facility]  Service: General;  Laterality: N/A;   COLONOSCOPY W/ POLYPECTOMY     CORONARY ANGIOPLASTY WITH STENT PLACEMENT  03/13/2014   "1"   [place]  "CABG X 7"   CORONARY STENT INTERVENTION N/A 12/26/2020   Procedure: CORONARY STENT INTERVENTION;  Surgeon: Barnes Oumar Marcott M, MD;  [facility];  Service: Cardiovascular;  Laterality: N/A;   CYSTOSCOPY WITH STENT PLACEMENT     EP IMPLANTABLE DEVICE N/A 12/17/2014   Procedure: BiV ICD Insertion CRT-D;  Surgeon: GEvans Lance MD;  [facility];  Service: Cardiovascular;  Laterality: N/A;   LEFT HEART CATH AND CORS/GRAFTS ANGIOGRAPHY N/A 12/26/2020   Procedure: LEFT HEART CATH AND CORS/GRAFTS ANGIOGRAPHY;  Surgeon: Barnes Kamri Gotsch M, MD;  [facility];  Service: Cardiovascular;  Laterality: N/A;   LEFT HEART CATHETERIZATION WITH CORONARY/GRAFT ANGIOGRAM N/A 03/13/2014   Procedure: LEFT HEART CATHETERIZATION WITH [name];  Surgeon: Preston Viveiros Barnes [name], MD;  [facility];  Service: Cardiovascular;  Laterality: N/A;   MULTIPLE EXTRACTIONS WITH ALVEOLOPLASTY N/A 02/07/2016   Procedure: Extraction of tooth #'s 1,2,3,14,15, 18, 20, 29,30,31 with alveoloplasty and dental cleaning of teeth.;  Surgeon: [name], DDS;  Location: [place];  Service: Oral Surgery;  Laterality: N/A;    TEE WITHOUT CARDIOVERSION N/A 02/18/2016   Procedure: TRANSESOPHAGEAL ECHOCARDIOGRAM (TEE);  Surgeon: Sherren Mocha, MD;  Location: [place];  Service: Open Heart Surgery;  Laterality: N/A;   TONSILLECTOMY  ~ 1950   TRANSCATHETER AORTIC VALVE REPLACEMENT, TRANSFEMORAL N/A 02/18/2016   Procedure: TRANSCATHETER AORTIC VALVE REPLACEMENT, TRANSFEMORAL;  Surgeon: Sherren Mocha, MD;  Location: [place];  Service: Open Heart Surgery;  Laterality: N/A;    Allergies  No Known Allergies  History of Present Illness    [name] is a 76 y.o. male with a hx of left bundle branch block, coronary artery disease s/p CABG and previous PCI, CHF, hyperlipidemia, hypertension last seen 01/14/2021 by Oda Kilts, PA.  He has known coronary artery disease with prior CABG in 1996 including LIMA-LAD and diagonal, SVG-acute marginal, PDA, and posterior lateral branches of the right coronary, sequential SVG to the obtuse marginal vessel and distal circumflex.  Cardiac cath 2007 showing patent grafts.  Repeat cath March 2016 with grafts patent but 90% stenosis in SVG-OM which was stented with DES.  He continued to have chest pain and had a cholecystectomy 09/4707 without complications and without relief of pain.  CTA chest negative for PE or aortic pathology.  EGD showed Barrett's esophagus but no esophagitis.  Admitted December 2016 for syncope.  Echo LVEF 30 to 35% with moderate to severe aortic stenosis with mean gradient 24 mmHg.  EEG negative.  He had left bundle branch block.  He underwent placement of BiV ICD by Dr. Lovena Le.  Repeat echo with some increase in AV gradient.  He underwent dobutamine echo showing improvement in LVEF 40 to 45%.  He underwent uncomplicated TAVR 06/13/8364 via percutaneous right transfemoral approach due to mean gradient 41 mmHg severe aortic stenosis.  Treated with 26 mm Edwards SAPIEN 3 transcatheter heart valve.  Repeat echo 03/2016 LVEF 40 to 45%.  Echo 02/2017 unchanged.  He continued to  have chest pain despite elevated forementioned interventions and was started on Imdur with some improvement.  He was noted to have atrial fibrillation based on pacer check.  He was started on Eliquis.  His echo 06/2020 showed worsening LVEF 25 to 30%.  He was evaluated in the clinic 12/2020 intended for outpatient cardiac catheterization.  Cardiac catheterization 12/26/2020 revealed 100% occluded ostial left main, 100% occluded ostial left circumflex artery, 100% occlusion to distal left main to mid LAD, 100% occluded ostial RCA, patent LIMA-LAD, occluded SVG to OM1 and OM 3 which is new since 2018, severe stenosis seen sequential SVG to PDA and PL branch of RCA treated with single DES.  LVEDP moderately elevated and he received a dose of Lasix in the Cath Lab.  He did require additional IV Lasix postprocedure.  Echo 12/27/2020 LVEF 30%, normal PASP, TAVR valve present in the aorta.  Weight decreased by 10 pounds during admission.  Ramipril  and spironolactone were discontinued due to rising creatinine with plan to eventually transition to [facility] once renal function improved.  He was treated  with empiric antibiotic due to concern for UTI.  He was recommended for triple therapy aspirin Plavix, Eliquis for 1 month.  On follow up last month he noted he still gets SOB with activity. Can't really do a lot. Notes chest pain improved from Dec but still uses a lot of Ntg - about 200 a month. Thinks a lot of his chest  pain is indigestion. No swelling. Weight is down 21 lbs since before hospitalization. Previously considered for Entresto but unable to afford. Apparently did not qualify for patient assistance. Same for Jardiance. We prescribed losartan at 25 mg daily but this made him dizzy and he actually passed out at [facility].  We repeated pro BNP which was still high so did not qualify for Barostim trial. Echo was repeated on March 18 2021 showing EF 50-55%. On My review I don't think there was much change with EF 35-40%  by my review. Mild RV dysfunction. Mild to moderate MR, functioning TAVR.   On follow up today he is doing well. As long as he monitors his pace he does well. Actually his chest pain has improved recently. Still notes some lightheadedness but no syncope. No edema. Has los 9 lbs- just not eating as much. Hasn't had to use prn lasix. On gabapentin now for neuropathy and this has helped. Overall pretty satisfied with how he is doing.   EKGs/Labs/Other Studies Reviewed:   The following studies were reviewed today:  Cath 12/26/2020   Ost LM to Dist LM lesion is 100% stenosed.   Ost Cx to Dist Cx lesion is 100% stenosed.   Dist LM to Mid LAD lesion is 100% stenosed.   Ost RCA to Prox RCA lesion is 100% stenosed.   Origin to Prox Graft lesion before 1st Mrg  is 100% stenosed.   Origin to Prox Graft lesion before RPDA  is 95% stenosed.   A drug-eluting stent was successfully placed using a STENT ONYX FRONTIER 4.0X22.   Post intervention, there is a 20% residual stenosis.   LIMA graft was visualized by angiography and is normal in caliber.   SVG graft was visualized by angiography.   SVG graft was not visualized.   The graft exhibits no disease.   LV end diastolic pressure is moderately elevated.   Left main and RCA occlusive disease. The patient is graft dependent. Patent LIMA to the LAD Occluded SVG to OM1 and OM3. This is new since 2018. Some collaterals to OM3 Severe stenosis in sequential SVG to PDA and PL branches of the RCA. Also new since 2018. Moderately elevated LVEDP Successful PCI of the SVG to PDA and PL. Used distal protection and DES x 1. Lesion was quite resistant.    Plan: will observe overnight. Patient was given Brilinta in the lab but I would transition to Plavix in am since he is also on anticoagulation and cost is a major issue. Would give ASA for one month and Plavix for 12 months. May resume Eliquis tomorrow if no bleeding. Will give IV lasix x 1. Need to update Echo-  this can be done as outpatient. Will again need to assess CHF therapy. Entresto and SGLT 2 inhibitors have been too expensive. Could consider aldactone.      Echo 12/27/2020  1. Left ventricular ejection fraction by 2D MOD biplane is 30.6 %. Left  ventricular diastolic parameters are consistent with Grade I diastolic  dysfunction (impaired relaxation).   2. Right ventricular systolic function was not well visualized. There is  normal pulmonary artery systolic pressure.   3. Mild paravalvular leak is present, mildy worsened from prior echo  06/25/2020. No dehiscence. AV peak velocity 1.4 Barnes/s; peak gradient 8.5  mmHg. Aortic valve regurgitation is trivial. There is a 26 mm Sapien  prosthetic (TAVR) valve present in the aortic   position. Procedure Date: 02/18/2016.   Echo 03/18/21: IMPRESSIONS     1. Left ventricular ejection fraction, by estimation, is 50 to 55%. The  left ventricle has low normal function. The left ventricle demonstrates  regional wall motion abnormalities (see scoring diagram/findings for  description). The left ventricular  internal cavity size was mildly dilated. There is mild left ventricular  hypertrophy. Left ventricular diastolic parameters are consistent with  Grade III diastolic dysfunction (restrictive). Elevated left ventricular  end-diastolic pressure. There is  severe hypokinesis of the left ventricular, entire inferior wall.   2. Right ventricular systolic function is mildly reduced. The right  ventricular size is mildly enlarged. There is normal pulmonary artery  systolic pressure.   3. Left atrial size was moderately dilated.   4. The mitral valve is grossly normal. Mild to moderate mitral valve  regurgitation.   5. Tricuspid valve regurgitation is moderate.   6. S/p TAVR . There is a trivial perivalvular leak . The aortic valve has  been repaired/replaced. Aortic valve regurgitation is trivial. Procedure  Date: 02/18/2016.   EKG:  No EKG today.    Recent Labs: 12/26/2020: Magnesium 1.9 12/27/2020: B Natriuretic Peptide 1,329.5 01/24/2021: Hemoglobin 11.9; Platelets 259 04/02/2021: BUN 24; Creatinine, Ser 1.41; NT-Pro BNP 8,204; Potassium 4.7; Sodium 140  Recent Lipid Panel    Component Value Date/Time   CHOL 120 07/11/2020 1015   TRIG 65 07/11/2020 1015   HDL 41 07/11/2020 1015   CHOLHDL 2.9 07/11/2020 1015   CHOLHDL 3.9 03/12/2015 0932   VLDL 21 03/12/2015 0932   LDLCALC 65 07/11/2020 1015    Risk Assessment/Calculations:   CHA2DS2-VASc Score = 5 This indicates a 7.2% annual risk of stroke. The patient's score is based upon: CHF History: 1 HTN History: 1 Diabetes History: 0 Stroke History: 0 Vascular Disease History: 1 Age Score: 2 Gender Score: 0    Home Medications   Allergies as of 08/11/2021   No Known Allergies      Medication List        Accurate as of August 11, 2021  8:26 AM. If you have any questions, ask your nurse or doctor.          apixaban 5 MG Tabs tablet Commonly known as: Eliquis Take 1 tablet (5 mg total) by mouth 2 (two) times daily.   atorvastatin 80 MG tablet Commonly known as: LIPITOR Take 1 tablet (80 mg total) by mouth daily.   clopidogrel 75 MG tablet Commonly known as: PLAVIX Take 1 tablet (75 mg total) by mouth daily.   cyanocobalamin 500 MCG tablet Commonly known as: VITAMIN B12 Take 500 mcg by mouth daily.   finasteride 5 MG tablet Commonly known as: PROSCAR Take 5 mg by mouth daily.   furosemide 20 MG tablet Commonly known as: LASIX Take 1 tablet (20 mg total) by mouth daily as needed.   gabapentin 300 MG capsule Commonly known as: NEURONTIN Take 300 mg by mouth daily.   isosorbide mononitrate 120 MG 24 hr tablet Commonly known as: IMDUR Take 1 tablet (120 mg total) by mouth daily.   metoprolol succinate 50 MG 24 hr tablet Commonly known as: TOPROL-XL TAKE ONE TABLET BY MOUTH  DAILY WITH OR IMMEDIATELY FOLLOWING A MEAL   nitroGLYCERIN 0.4 MG SL  tablet Commonly known as: NITROSTAT DISSOLVE 1 TABLET UNDER TONGUE FOR CHEST PAIN - IF PAIN REMAINS AFTER 5 MIN, CALL 911 AND REPEAT DOSE. MAX 3 TABLETS IN 15 MINUTES FOR 30 DAY SUPPLY   oxybutynin 5 MG tablet Commonly known as: DITROPAN Take 5 mg by mouth daily.   pantoprazole 40 MG tablet Commonly known as: PROTONIX Take 1 tablet (40 mg total) by mouth daily.   ranolazine 500 MG 12 hr tablet Commonly known as: RANEXA Take 1 tablet (500 mg total) by mouth 2 (two) times daily.   spironolactone 25 MG tablet Commonly known as: ALDACTONE Take 12.5 mg by mouth daily.   valACYclovir 500 MG tablet Commonly known as: VALTREX TAKE ONE TABLET BY MOUTH DAILY AS NEEDED FOR OUTBREAK   Vitamin C 500 MG Caps Take 500 mg by mouth daily.   Vitamin D-3 125 MCG (5000 UT) Tabs Take 5,000 Units by mouth daily.         Review of Systems      All other systems reviewed and are otherwise negative except as noted above.  Physical Exam    VS:  BP 119/62   Pulse 60   Ht '5\' 10"'$  (1.778 Barnes)   Wt 183 lb (83 kg)   SpO2 99%   BMI 26.26 kg/Barnes  , BMI Body mass index is 26.26 kg/Barnes.  Wt Readings from Last 3 Encounters:  08/11/21 183 lb (83 kg)  04/09/21 192 lb 4 oz (87.2 kg)  03/24/21 201 lb (91.2 kg)    GEN: Well nourished, well developed, in no acute distress. HEENT: normal. Neck: Supple, no JVD, carotid bruits, or masses. Cardiac: RRR, no murmurs, rubs, or gallops. No clubbing, cyanosis, edema.  Radials/PT 2+ and equal bilaterally.  Respiratory:  Respirations regular and unlabored, clear to auscultation bilaterally. GI: Soft, nontender, nondistended. MS: No deformity or atrophy. Skin: Warm and dry, no rash. Neuro:  Strength and sensation are intact. Psych: Normal affect.  Assessment & Plan    Chronic systolic heart failure / ICM - EF 30% in Dec.  Currently class 2-3. S/p revascularization in Dec 2022 of SVG to PDA. Volume status looks very good. Now on Toprol, aldactone. Unable to  tolerate losartan even at low dose due to dizziness and syncope.  Unable to afford Entresto or SGLT 2 inhibitor.   Previously considered for Barostim trial but BNP was too high. I personally reviewed his Echo. I think EF of 50-55% as reported is not accurate. There is some improvement compared to Dec mostly in anterior wall and septum but I would estimate EF 40%. Will continue current therapy  PAF / chronic anticoagulation - Denies palpitations. Denies bleeding complications on Eliquis. CHA2DS2-VASc Score = 5 [CHF History: 1, HTN History: 1, Diabetes History: 0, Stroke History: 0, Vascular Disease History: 1, Age Score: 2, Gender Score: 0].  Therefore, the patient's annual risk of stroke is 7.2 %.     HLD, LDL goal <70 - Continue Atorvastatin '80mg'$  QD. Denies myalgias. Last LDL 65.   CAD - stable anginal symptoms. S/p stent of SVG to PDA in Dec.   GDMT includes Plavix, Imdur, Ranexa, metoprolol, as needed nitroglycerin which historically he has used frequently.  Continue Plavix for 1 year from stent 12/27/20 (PCI of SVG-PDA and PL).  S/p CRT/ICD - Continue to follow with EP. Not candidate for Barostim due to elevated BNP.   Severe AS S/p TAVR - Continue  to monitor with periodic echo. Most recent echo March 18, 2021 with trivial AI, mild perivalvular leak, no significant stenosis   Disposition: Follow up in 6 months  Signed, Gilad Dugger [name], MD 08/11/2021, 8:26 AM [place]

## 2021-08-07 ENCOUNTER — Ambulatory Visit (INDEPENDENT_AMBULATORY_CARE_PROVIDER_SITE_OTHER): Payer: Medicare HMO

## 2021-08-07 DIAGNOSIS — I447 Left bundle-branch block, unspecified: Secondary | ICD-10-CM | POA: Diagnosis not present

## 2021-08-07 LAB — CUP PACEART REMOTE DEVICE CHECK
Battery Remaining Longevity: 8 mo
Battery Voltage: 2.85 V
Brady Statistic AP VP Percent: 75.11 %
Brady Statistic AP VS Percent: 0.01 %
Brady Statistic AS VP Percent: 24.82 %
Brady Statistic AS VS Percent: 0.06 %
Brady Statistic RA Percent Paced: 74.05 %
Brady Statistic RV Percent Paced: 99.62 %
Date Time Interrogation Session: 20230727023324
HighPow Impedance: 75 Ohm
Implantable Lead Implant Date: 20161205
Implantable Lead Implant Date: 20161205
Implantable Lead Implant Date: 20161205
Implantable Lead Location: 753858
Implantable Lead Location: 753859
Implantable Lead Location: 753860
Implantable Lead Model: 4598
Implantable Lead Model: 5076
Implantable Lead Model: 6935
Implantable Pulse Generator Implant Date: 20161205
Lead Channel Impedance Value: 304 Ohm
Lead Channel Impedance Value: 342 Ohm
Lead Channel Impedance Value: 361 Ohm
Lead Channel Impedance Value: 361 Ohm
Lead Channel Impedance Value: 361 Ohm
Lead Channel Impedance Value: 418 Ohm
Lead Channel Impedance Value: 418 Ohm
Lead Channel Impedance Value: 418 Ohm
Lead Channel Impedance Value: 532 Ohm
Lead Channel Impedance Value: 551 Ohm
Lead Channel Impedance Value: 589 Ohm
Lead Channel Impedance Value: 608 Ohm
Lead Channel Impedance Value: 646 Ohm
Lead Channel Pacing Threshold Amplitude: 0.5 V
Lead Channel Pacing Threshold Amplitude: 0.625 V
Lead Channel Pacing Threshold Amplitude: 0.875 V
Lead Channel Pacing Threshold Pulse Width: 0.4 ms
Lead Channel Pacing Threshold Pulse Width: 0.4 ms
Lead Channel Pacing Threshold Pulse Width: 0.4 ms
Lead Channel Sensing Intrinsic Amplitude: 1.5 mV
Lead Channel Sensing Intrinsic Amplitude: 1.5 mV
Lead Channel Sensing Intrinsic Amplitude: 10.375 mV
Lead Channel Sensing Intrinsic Amplitude: 15.375 mV
Lead Channel Setting Pacing Amplitude: 2 V
Lead Channel Setting Pacing Amplitude: 2 V
Lead Channel Setting Pacing Amplitude: 2.5 V
Lead Channel Setting Pacing Pulse Width: 0.4 ms
Lead Channel Setting Pacing Pulse Width: 0.4 ms
Lead Channel Setting Sensing Sensitivity: 0.3 mV

## 2021-08-11 ENCOUNTER — Ambulatory Visit (INDEPENDENT_AMBULATORY_CARE_PROVIDER_SITE_OTHER): Payer: Medicare HMO | Admitting: Cardiology

## 2021-08-11 ENCOUNTER — Encounter: Payer: Self-pay | Admitting: Cardiology

## 2021-08-11 VITALS — BP 119/62 | HR 60 | Ht 70.0 in | Wt 183.0 lb

## 2021-08-11 DIAGNOSIS — Z952 Presence of prosthetic heart valve: Secondary | ICD-10-CM

## 2021-08-11 DIAGNOSIS — I447 Left bundle-branch block, unspecified: Secondary | ICD-10-CM

## 2021-08-11 DIAGNOSIS — I25118 Atherosclerotic heart disease of native coronary artery with other forms of angina pectoris: Secondary | ICD-10-CM

## 2021-08-11 DIAGNOSIS — I5022 Chronic systolic (congestive) heart failure: Secondary | ICD-10-CM

## 2021-08-11 DIAGNOSIS — I480 Paroxysmal atrial fibrillation: Secondary | ICD-10-CM | POA: Diagnosis not present

## 2021-08-13 ENCOUNTER — Other Ambulatory Visit: Payer: Self-pay | Admitting: Cardiology

## 2021-08-13 DIAGNOSIS — I5022 Chronic systolic (congestive) heart failure: Secondary | ICD-10-CM

## 2021-08-21 DIAGNOSIS — L570 Actinic keratosis: Secondary | ICD-10-CM | POA: Diagnosis not present

## 2021-08-21 DIAGNOSIS — L821 Other seborrheic keratosis: Secondary | ICD-10-CM | POA: Diagnosis not present

## 2021-08-21 DIAGNOSIS — D225 Melanocytic nevi of trunk: Secondary | ICD-10-CM | POA: Diagnosis not present

## 2021-08-21 DIAGNOSIS — X32XXXD Exposure to sunlight, subsequent encounter: Secondary | ICD-10-CM | POA: Diagnosis not present

## 2021-08-27 DIAGNOSIS — G629 Polyneuropathy, unspecified: Secondary | ICD-10-CM | POA: Diagnosis not present

## 2021-08-27 DIAGNOSIS — Q667 Congenital pes cavus, unspecified foot: Secondary | ICD-10-CM | POA: Diagnosis not present

## 2021-08-27 DIAGNOSIS — L851 Acquired keratosis [keratoderma] palmaris et plantaris: Secondary | ICD-10-CM | POA: Diagnosis not present

## 2021-08-27 DIAGNOSIS — M792 Neuralgia and neuritis, unspecified: Secondary | ICD-10-CM | POA: Diagnosis not present

## 2021-08-27 DIAGNOSIS — M2041 Other hammer toe(s) (acquired), right foot: Secondary | ICD-10-CM | POA: Diagnosis not present

## 2021-08-28 NOTE — Progress Notes (Signed)
Remote ICD transmission.   

## 2021-09-11 ENCOUNTER — Other Ambulatory Visit: Payer: Self-pay

## 2021-09-11 DIAGNOSIS — I480 Paroxysmal atrial fibrillation: Secondary | ICD-10-CM

## 2021-09-11 MED ORDER — APIXABAN 5 MG PO TABS: 5.0000 mg | ORAL_TABLET | Freq: Two times a day (BID) | ORAL | 5 refills | Status: DC

## 2021-09-11 NOTE — Telephone Encounter (Signed)
Prescription refill request for Eliquis received. Indication: Afib Last office visit: 08/11/21 ([name]) Scr: 1.41 (04/02/21)  Age: 76 Weight: 83kg  Appropriate dose and refill sent to requested pharmacy.

## 2021-09-17 ENCOUNTER — Telehealth: Payer: Self-pay | Admitting: Cardiology

## 2021-09-17 ENCOUNTER — Other Ambulatory Visit: Payer: Self-pay | Admitting: *Deleted

## 2021-09-17 ENCOUNTER — Telehealth: Payer: Self-pay | Admitting: *Deleted

## 2021-09-17 DIAGNOSIS — I480 Paroxysmal atrial fibrillation: Secondary | ICD-10-CM

## 2021-09-17 MED ORDER — APIXABAN 5 MG PO TABS: 5.0000 mg | ORAL_TABLET | Freq: Two times a day (BID) | ORAL | 5 refills | Status: DC

## 2021-09-17 NOTE — Telephone Encounter (Signed)
Lm to call back ./cy 

## 2021-09-17 NOTE — Patient Outreach (Signed)
  Care Coordination   Initial Visit Note   09/17/2021 Name: RAYSEAN GRAUMANN MRN: [id number] DOB: 1945-06-08  [name] is a 76 y.o. year old male who sees Janeann Merl, MD for primary care. I spoke with  [name] by phone today.  What matters to the patients health and wellness today?  NOT INTERESTED IN SERVICES.   SDOH assessments and interventions completed:  No     Care Coordination Interventions Activated:  No  Care Coordination Interventions:  No, not indicated   Follow up plan: No further intervention required.   Encounter Outcome:  Pt. Refused   Pricsilla Lindvall C. Myrtie Neither, MSN, [facility] Gerontological Nurse Practitioner [facility] Care Management [phone]

## 2021-09-17 NOTE — Telephone Encounter (Signed)
Eliquis '5mg'$  refill request received. Patient is 76 years old, weight-83kg, Crea-1.41 on 04/02/2021, Diagnosis-Afib, and last seen by Peter [name] on 08/11/2021. Dose is appropriate based on dosing criteria. Will send in refill to requested pharmacy.

## 2021-09-17 NOTE — Telephone Encounter (Signed)
Patient had question about medications causing daytime dizziness. Upon review of medications, patient has been taking losartan, which had been d/c by patient on 03/24/21. He  will stop taking losartan to see if his dizzines stops. He iwll monitor BP as well and call clinic later in the week. He sated he has been off eliquis for one month due to the donut hole. New PAP application form mailed to patient. Samples are not available at this time.

## 2021-09-17 NOTE — Telephone Encounter (Signed)
See other notation for 09/17/21.

## 2021-09-17 NOTE — Telephone Encounter (Signed)
Patient calling to speak with the nurse. Please advise 

## 2021-09-17 NOTE — Telephone Encounter (Signed)
Pt c/o medication issue:  1. Name of Medication:   Losartan (not on list)  gabapentin (NEURONTIN) 300 MG capsule  furosemide (LASIX) 20 MG tablet  2. How are you currently taking this medication (dosage and times per day)? As prescribed  3. Are you having a reaction (difficulty breathing--STAT)? No  4. What is your medication issue?  STAT if patient feels like he/she is going to faint   Are you dizzy now? No  Do you feel faint or have you passed out?  A little, but not to throw him off balance.  Do you have any other symptoms? No  Have you checked your HR and BP (record if available)?    Today - BP 120/65  HR   Patient called concerned that he only feels dizzy during the day when he takes his medications and not at night when he feels the medication has worn off.

## 2021-10-02 ENCOUNTER — Other Ambulatory Visit: Payer: Self-pay

## 2021-10-02 DIAGNOSIS — I350 Nonrheumatic aortic (valve) stenosis: Secondary | ICD-10-CM

## 2021-10-02 MED ORDER — METOPROLOL SUCCINATE ER 50 MG PO TB24: ORAL_TABLET | ORAL | 3 refills | Status: DC

## 2021-10-23 DIAGNOSIS — L570 Actinic keratosis: Secondary | ICD-10-CM | POA: Diagnosis not present

## 2021-10-23 DIAGNOSIS — X32XXXD Exposure to sunlight, subsequent encounter: Secondary | ICD-10-CM | POA: Diagnosis not present

## 2021-11-05 ENCOUNTER — Other Ambulatory Visit: Payer: Self-pay | Admitting: Cardiology

## 2021-11-05 DIAGNOSIS — I25708 Atherosclerosis of coronary artery bypass graft(s), unspecified, with other forms of angina pectoris: Secondary | ICD-10-CM

## 2021-11-06 ENCOUNTER — Ambulatory Visit (INDEPENDENT_AMBULATORY_CARE_PROVIDER_SITE_OTHER): Payer: Medicare HMO

## 2021-11-06 DIAGNOSIS — I447 Left bundle-branch block, unspecified: Secondary | ICD-10-CM | POA: Diagnosis not present

## 2021-11-06 LAB — CUP PACEART REMOTE DEVICE CHECK
Battery Remaining Longevity: 6 mo
Battery Voltage: 2.82 V
Brady Statistic AP VP Percent: 87.42 %
Brady Statistic AP VS Percent: 0.01 %
Brady Statistic AS VP Percent: 12.52 %
Brady Statistic AS VS Percent: 0.05 %
Brady Statistic RA Percent Paced: 86.72 %
Brady Statistic RV Percent Paced: 99.03 %
Date Time Interrogation Session: 20231026022824
HighPow Impedance: 70 Ohm
Implantable Lead Connection Status: 753985
Implantable Lead Connection Status: 753985
Implantable Lead Connection Status: 753985
Implantable Lead Implant Date: 20161205
Implantable Lead Implant Date: 20161205
Implantable Lead Implant Date: 20161205
Implantable Lead Location: 753858
Implantable Lead Location: 753859
Implantable Lead Location: 753860
Implantable Lead Model: 4598
Implantable Lead Model: 5076
Implantable Lead Model: 6935
Implantable Pulse Generator Implant Date: 20161205
Lead Channel Impedance Value: 285 Ohm
Lead Channel Impedance Value: 342 Ohm
Lead Channel Impedance Value: 342 Ohm
Lead Channel Impedance Value: 361 Ohm
Lead Channel Impedance Value: 361 Ohm
Lead Channel Impedance Value: 418 Ohm
Lead Channel Impedance Value: 418 Ohm
Lead Channel Impedance Value: 456 Ohm
Lead Channel Impedance Value: 532 Ohm
Lead Channel Impedance Value: 551 Ohm
Lead Channel Impedance Value: 551 Ohm
Lead Channel Impedance Value: 608 Ohm
Lead Channel Impedance Value: 608 Ohm
Lead Channel Pacing Threshold Amplitude: 0.5 V
Lead Channel Pacing Threshold Amplitude: 0.625 V
Lead Channel Pacing Threshold Amplitude: 0.875 V
Lead Channel Pacing Threshold Pulse Width: 0.4 ms
Lead Channel Pacing Threshold Pulse Width: 0.4 ms
Lead Channel Pacing Threshold Pulse Width: 0.4 ms
Lead Channel Sensing Intrinsic Amplitude: 1.375 mV
Lead Channel Sensing Intrinsic Amplitude: 1.375 mV
Lead Channel Sensing Intrinsic Amplitude: 10.375 mV
Lead Channel Sensing Intrinsic Amplitude: 15.375 mV
Lead Channel Setting Pacing Amplitude: 2 V
Lead Channel Setting Pacing Amplitude: 2 V
Lead Channel Setting Pacing Amplitude: 2.5 V
Lead Channel Setting Pacing Pulse Width: 0.4 ms
Lead Channel Setting Pacing Pulse Width: 0.4 ms
Lead Channel Setting Sensing Sensitivity: 0.3 mV
Zone Setting Status: 755011
Zone Setting Status: 755011

## 2021-11-10 ENCOUNTER — Other Ambulatory Visit: Payer: Self-pay | Admitting: Cardiology

## 2021-11-11 ENCOUNTER — Other Ambulatory Visit: Payer: Self-pay | Admitting: Cardiology

## 2021-11-17 NOTE — Progress Notes (Signed)
Remote ICD transmission.   

## 2021-11-21 ENCOUNTER — Other Ambulatory Visit (HOSPITAL_BASED_OUTPATIENT_CLINIC_OR_DEPARTMENT_OTHER): Payer: Self-pay | Admitting: Family

## 2021-11-21 NOTE — Telephone Encounter (Signed)
Patient of Dr. [name]. Please review for refill. Thank you!

## 2021-12-03 ENCOUNTER — Other Ambulatory Visit: Payer: Self-pay | Admitting: Cardiology

## 2021-12-03 DIAGNOSIS — I350 Nonrheumatic aortic (valve) stenosis: Secondary | ICD-10-CM

## 2021-12-06 ENCOUNTER — Other Ambulatory Visit: Payer: Self-pay | Admitting: Cardiology

## 2021-12-06 ENCOUNTER — Other Ambulatory Visit: Payer: Self-pay | Admitting: Physician Assistant

## 2021-12-06 DIAGNOSIS — I25708 Atherosclerosis of coronary artery bypass graft(s), unspecified, with other forms of angina pectoris: Secondary | ICD-10-CM

## 2021-12-08 NOTE — Telephone Encounter (Signed)
*  STAT* If patient is at the pharmacy, call can be transferred to refill team.   1. Which medications need to be refilled? (please list name of each medication and dose if known) new prescription for Isosorbide and Clopidogrel  2. Which pharmacy/location (including street and city if local pharmacy) is medication to be sent to?  [place] [place]  3. Do they need a 30 day or 90 day supply? 90 days [place] refills

## 2021-12-18 DIAGNOSIS — L570 Actinic keratosis: Secondary | ICD-10-CM | POA: Diagnosis not present

## 2021-12-18 DIAGNOSIS — X32XXXD Exposure to sunlight, subsequent encounter: Secondary | ICD-10-CM | POA: Diagnosis not present

## 2021-12-18 DIAGNOSIS — D044 Carcinoma in situ of skin of scalp and neck: Secondary | ICD-10-CM | POA: Diagnosis not present

## 2022-01-13 NOTE — Progress Notes (Signed)
Office Visit    Patient Name: [name] Date of Encounter: 01/22/2022  PCP:  Janeann Merl, MD   [place]  Cardiologist:  Jaylaa Gallion [name], MD  Advanced Practice Provider:  No care team member to display Electrophysiologist:  [name], MD     Chief Complaint    [name] is a 77 y.o. male with a hx of left bundle branch block, coronary artery disease s/p CABG and previous PCI, CHF, hyperlipidemia, hypertension presents today for follow-up of CHF  Past Medical History    Past Medical History:  Diagnosis Date   Aortic stenosis    Barrett's esophageal ulceration    Chronic systolic CHF (congestive heart failure) ([place])    Coronary artery disease    a. CABG 1996: LIMA graft to the LAD and diagonal, sequential vein graft to the acute marginal, PDA, and posterior lateral branches of the right coronary, sequential saphenous vein graft to the obtuse marginal vessel and distal circumflex. b. Cath 2007 patent grafts. c. s/p DES to SVG-OM2 in 03/2014.   Coronary artery disease involving autologous vein coronary bypass graft with angina pectoris ([place]) 03/13/2014   PCI using DES in SVG to LCx system   Dental caries    pre-heart valve surgery protocol   Gastroesophageal reflux disease    History of erectile dysfunction    History of hiatal hernia    History of kidney stones    History of obesity    History of prostatitis    Hypercholesterolemia    Hypertension    Kidney stones    LBBB (left bundle branch block)    Nodule of kidney 02/04/2016   Incidental 17m nodule upper pole right kidney noted on CT angiogram - MRI with and without gadolinium contrast recommened in 6 months   OSA (obstructive sleep apnea)    "suppose to wear mask; I don't" (03/13/2014)   PVC's (premature ventricular contractions)    S/P CABG x 7 01/07/1995   LIMA to LAD-diagonal, SVG to OM-LCx, SVG to AM-PD-RPL   S/P TAVR (transcatheter aortic valve replacement) 02/18/2016   26  mm Edwards Sapien 3 transcatheter heart valve placed via percutaneous right transfemoral approach   Past Surgical History:  Procedure Laterality Date   [place] ~ 2006   ; Ejection fraction is estimated at 45%   CARDIAC CATHETERIZATION N/A 01/22/2016   Procedure: Right/Left Heart Cath and Coronary/Graft Angiography;  Surgeon: Brendin Situ M JMartinique MD;  [facility];  Service: Cardiovascular;  Laterality: N/A;   CHOLECYSTECTOMY N/A 01/23/2014   Procedure: LAPAROSCOPIC CHOLECYSTECTOMY WITH INTRAOPERATIVE CHOLANGIOGRAM ;  Surgeon: HFanny Skates MD;  [place] [facility]  Service: General;  Laterality: N/A;   COLONOSCOPY W/ POLYPECTOMY     CORONARY ANGIOPLASTY WITH STENT PLACEMENT  03/13/2014   "1"   [facility]  "CABG X 7"   CORONARY STENT INTERVENTION N/A 12/26/2020   Procedure: CORONARY STENT INTERVENTION;  Surgeon: JMartinique Anyely Cunning M, MD;  [facility];  Service: Cardiovascular;  Laterality: N/A;   CYSTOSCOPY WITH STENT PLACEMENT     EP IMPLANTABLE DEVICE N/A 12/17/2014   Procedure: BiV ICD Insertion CRT-D;  Surgeon: GEvans Lance MD;  [facility];  Service: Cardiovascular;  Laterality: N/A;   LEFT HEART CATH AND CORS/GRAFTS ANGIOGRAPHY N/A 12/26/2020   Procedure: LEFT HEART CATH AND CORS/GRAFTS ANGIOGRAPHY;  Surgeon: JMartinique Toleen Lachapelle M, MD;  [facility];  Service: Cardiovascular;  Laterality: N/A;   LEFT HEART CATHETERIZATION WITH CORONARY/GRAFT ANGIOGRAM N/A 03/13/2014   Procedure: LEFT HEART CATHETERIZATION WITH Beatrix Fetters;  Surgeon: Coleson Kant M [name], MD;  Location: [facility];  Service: Cardiovascular;  Laterality: N/A;   MULTIPLE EXTRACTIONS WITH ALVEOLOPLASTY N/A 02/07/2016   Procedure: Extraction of tooth #'s 1,2,3,14,15, 18, 20, 29,30,31 with alveoloplasty and dental cleaning of teeth.;  Surgeon: Lenn Cal, DDS;  Location: [place];  Service: Oral Surgery;  Laterality: N/A;    TEE WITHOUT CARDIOVERSION N/A 02/18/2016   Procedure: TRANSESOPHAGEAL ECHOCARDIOGRAM (TEE);  Surgeon: Sherren Mocha, MD;  Location: [place];  Service: Open Heart Surgery;  Laterality: N/A;   TONSILLECTOMY  ~ 1950   TRANSCATHETER AORTIC VALVE REPLACEMENT, TRANSFEMORAL N/A 02/18/2016   Procedure: TRANSCATHETER AORTIC VALVE REPLACEMENT, TRANSFEMORAL;  Surgeon: Sherren Mocha, MD;  Location: [place];  Service: Open Heart Surgery;  Laterality: N/A;    Allergies  No Known Allergies  History of Present Illness    [name] is a 77 y.o. male with a hx of left bundle branch block, coronary artery disease s/p CABG and previous PCI, CHF, hyperlipidemia, hypertension.   He has known coronary artery disease with prior CABG in 1996 including LIMA-LAD and diagonal, SVG-acute marginal, PDA, and posterior lateral branches of the right coronary, sequential SVG to the obtuse marginal vessel and distal circumflex.  Cardiac cath 2007 showing patent grafts.  Repeat cath March 2016 with grafts patent but 90% stenosis in SVG-OM which was stented with DES.  He continued to have chest pain and had a cholecystectomy 01/7791 without complications and without relief of pain.  CTA chest negative for PE or aortic pathology.  EGD showed Barrett's esophagus but no esophagitis.  Admitted December 2016 for syncope.  Echo LVEF 30 to 35% with moderate to severe aortic stenosis with mean gradient 24 mmHg.  EEG negative.  He had left bundle branch block.  He underwent placement of BiV ICD by Dr. Lovena Le.  Repeat echo with some increase in AV gradient.  He underwent dobutamine echo showing improvement in LVEF 40 to 45%.  He underwent uncomplicated TAVR 9/0/3009 via percutaneous right transfemoral approach due to mean gradient 41 mmHg severe aortic stenosis.  Treated with 26 mm Edwards SAPIEN 3 transcatheter heart valve.  Repeat echo 03/2016 LVEF 40 to 45%.  Echo 02/2017 unchanged.  He continued to have chest pain despite elevated  forementioned interventions and was started on Imdur with some improvement.  He was noted to have atrial fibrillation based on pacer check.  He was started on Eliquis.  His echo 06/2020 showed worsening LVEF 25 to 30%.  He was evaluated in the clinic 12/2020 intended for outpatient cardiac catheterization.  Cardiac catheterization 12/26/2020 revealed 100% occluded ostial left main, 100% occluded ostial left circumflex artery, 100% occlusion to distal left main to mid LAD, 100% occluded ostial RCA, patent LIMA-LAD, occluded SVG to OM1 and OM 3 which is new since 2018, severe stenosis seen sequential SVG to PDA and PL branch of RCA treated with single DES.  LVEDP moderately elevated and he received a dose of Lasix in the Cath Lab.  He did require additional IV Lasix postprocedure.  Echo 12/27/2020 LVEF 30%, normal PASP, TAVR valve present in the aorta.  Weight decreased by 10 pounds during admission.  Ramipril  and spironolactone were discontinued due to rising creatinine with plan to eventually transition to [facility] once renal function improved.  He was treated with empiric antibiotic due to concern  for UTI.  He was recommended for triple therapy aspirin Plavix, Eliquis for 1 month.  On follow up he noted persistent  SOB with activity. Can't really do a lot. Notes chest pain improved from Dec but still uses a lot of Ntg - about 200 a month. Thinks a lot of his chest  pain is indigestion. No swelling. Previously considered for Entresto but unable to afford. Apparently did not qualify for patient assistance. Same for Jardiance. We prescribed losartan at 25 mg daily but this made him dizzy and he actually passed out at [facility].  We repeated pro BNP which was still high so did not qualify for Barostim trial. Echo was repeated on March 18 2021 showing EF 50-55%. On My review I don't think there was much change with EF 35-40% by my review. Mild RV dysfunction. Mild to moderate MR, functioning TAVR.   On follow up today  he is doing well. As long as he monitors his pace he does well. Actually his chest pain has improved recently. He went from over 200 sl Ntg/month to less than 25. He states one of his medications made him dizzy. He is not sure which one but he stopped taking ? Ranexa. No edema. Weight is stable.  Hasn't had to use prn lasix. Overall pretty satisfied with how he is doing. Is getting close to ERI on device.   EKGs/Labs/Other Studies Reviewed:   The following studies were reviewed today:  Cath 12/26/2020   Ost LM to Dist LM lesion is 100% stenosed.   Ost Cx to Dist Cx lesion is 100% stenosed.   Dist LM to Mid LAD lesion is 100% stenosed.   Ost RCA to Prox RCA lesion is 100% stenosed.   Origin to Prox Graft lesion before 1st Mrg  is 100% stenosed.   Origin to Prox Graft lesion before RPDA  is 95% stenosed.   A drug-eluting stent was successfully placed using a STENT ONYX FRONTIER 4.0X22.   Post intervention, there is a 20% residual stenosis.   LIMA graft was visualized by angiography and is normal in caliber.   SVG graft was visualized by angiography.   SVG graft was not visualized.   The graft exhibits no disease.   LV end diastolic pressure is moderately elevated.   Left main and RCA occlusive disease. The patient is graft dependent. Patent LIMA to the LAD Occluded SVG to OM1 and OM3. This is new since 2018. Some collaterals to OM3 Severe stenosis in sequential SVG to PDA and PL branches of the RCA. Also new since 2018. Moderately elevated LVEDP Successful PCI of the SVG to PDA and PL. Used distal protection and DES x 1. Lesion was quite resistant.    Plan: will observe overnight. Patient was given Brilinta in the lab but I would transition to Plavix in am since he is also on anticoagulation and cost is a major issue. Would give ASA for one month and Plavix for 12 months. May resume Eliquis tomorrow if no bleeding. Will give IV lasix x 1. Need to update Echo- this can be done as outpatient.  Will again need to assess CHF therapy. Entresto and SGLT 2 inhibitors have been too expensive. Could consider aldactone.      Echo 12/27/2020  1. Left ventricular ejection fraction by 2D MOD biplane is 30.6 %. Left  ventricular diastolic parameters are consistent with Grade I diastolic  dysfunction (impaired relaxation).   2. Right ventricular systolic function was not well visualized. There is  normal pulmonary artery systolic pressure.   3. Mild paravalvular leak is present, mildy worsened from prior echo  06/25/2020. No dehiscence. AV peak velocity 1.4 m/s; peak gradient 8.5  mmHg. Aortic valve regurgitation is trivial. There is a 26 mm Sapien  prosthetic (TAVR) valve present in the aortic   position. Procedure Date: 02/18/2016.   Echo 03/18/21: IMPRESSIONS     1. Left ventricular ejection fraction, by estimation, is 50 to 55%. The  left ventricle has low normal function. The left ventricle demonstrates  regional wall motion abnormalities (see scoring diagram/findings for  description). The left ventricular  internal cavity size was mildly dilated. There is mild left ventricular  hypertrophy. Left ventricular diastolic parameters are consistent with  Grade III diastolic dysfunction (restrictive). Elevated left ventricular  end-diastolic pressure. There is  severe hypokinesis of the left ventricular, entire inferior wall.   2. Right ventricular systolic function is mildly reduced. The right  ventricular size is mildly enlarged. There is normal pulmonary artery  systolic pressure.   3. Left atrial size was moderately dilated.   4. The mitral valve is grossly normal. Mild to moderate mitral valve  regurgitation.   5. Tricuspid valve regurgitation is moderate.   6. S/p TAVR . There is a trivial perivalvular leak . The aortic valve has  been repaired/replaced. Aortic valve regurgitation is trivial. Procedure  Date: 02/18/2016.   EKG:  EKG today shows BiV pacing rate 60. I have  personally reviewed and interpreted this study.   Recent Labs: 01/24/2021: Hemoglobin 11.9; Platelets 259 04/02/2021: BUN 24; Creatinine, Ser 1.41; NT-Pro BNP 8,204; Potassium 4.7; Sodium 140  Recent Lipid Panel    Component Value Date/Time   CHOL 120 07/11/2020 1015   TRIG 65 07/11/2020 1015   HDL 41 07/11/2020 1015   CHOLHDL 2.9 07/11/2020 1015   CHOLHDL 3.9 03/12/2015 0932   VLDL 21 03/12/2015 0932   LDLCALC 65 07/11/2020 1015    Risk Assessment/Calculations:   CHA2DS2-VASc Score = 5 This indicates a 7.2% annual risk of stroke. The patient's score is based upon: CHF History: 1 HTN History: 1 Diabetes History: 0 Stroke History: 0 Vascular Disease History: 1 Age Score: 2 Gender Score: 0    Home Medications   Allergies as of 01/22/2022   No Known Allergies      Medication List        Accurate as of January 22, 2022  8:43 AM. If you have any questions, ask your nurse or doctor.          apixaban 5 MG Tabs tablet Commonly known as: Eliquis Take 1 tablet (5 mg total) by mouth 2 (two) times daily.   atorvastatin 80 MG tablet Commonly known as: LIPITOR TAKE ONE TABLET BY MOUTH DAILY   clopidogrel 75 MG tablet Commonly known as: PLAVIX TAKE ONE TABLET BY MOUTH DAILY   cyanocobalamin 500 MCG tablet Commonly known as: VITAMIN B12 Take 500 mcg by mouth daily.   finasteride 5 MG tablet Commonly known as: PROSCAR Take 5 mg by mouth daily.   furosemide 20 MG tablet Commonly known as: LASIX Take 1 tablet (20 mg total) by mouth daily. Please keep scheduled appointment   gabapentin 300 MG capsule Commonly known as: NEURONTIN Take 300 mg by mouth daily.   isosorbide mononitrate 120 MG 24 hr tablet Commonly known as: IMDUR TAKE ONE TABLET BY MOUTH DAILY   metoprolol succinate 50 MG 24 hr tablet Commonly known as: TOPROL-XL TAKE ONE TABLET BY MOUTH DAILY WITH OR  IMMEDIATELY FOLLOWING A MEAL   nitroGLYCERIN 0.4 MG SL tablet Commonly known as:  NITROSTAT DISSOLVE 1 TAB UNDER TONGUE FOR CHEST PAIN - IF PAIN REMAINS AFTER 5 MIN, CALL 911 AND REPEAT DOSE. MAX 3 TABS IN 15 MINUTES   oxybutynin 5 MG tablet Commonly known as: DITROPAN Take 5 mg by mouth daily.   pantoprazole 40 MG tablet Commonly known as: PROTONIX TAKE ONE TABLET BY MOUTH DAILY   ranolazine 500 MG 12 hr tablet Commonly known as: RANEXA TAKE ONE TABLET BY MOUTH TWICE A DAY   spironolactone 25 MG tablet Commonly known as: ALDACTONE TAKE 1/2 TABLET BY MOUTH DAILY   valACYclovir 500 MG tablet Commonly known as: VALTREX TAKE ONE TABLET BY MOUTH DAILY AS NEEDED FOR OUTBREAK   Vitamin C 500 MG Caps Take 500 mg by mouth daily.   Vitamin D-3 125 MCG (5000 UT) Tabs Take 5,000 Units by mouth daily.         Review of Systems      All other systems reviewed and are otherwise negative except as noted above.  Physical Exam    VS:  BP 138/60   Pulse (!) 59   Ht '5\' 9"'$  (1.753 m)   Wt 184 lb 6.4 oz (83.6 kg)   SpO2 100%   BMI 27.23 kg/m  , BMI Body mass index is 27.23 kg/m.  Wt Readings from Last 3 Encounters:  01/22/22 184 lb 6.4 oz (83.6 kg)  08/11/21 183 lb (83 kg)  04/09/21 192 lb 4 oz (87.2 kg)    GEN: Well nourished, well developed, in no acute distress. HEENT: normal. Neck: Supple, no JVD, carotid bruits, or masses. Cardiac: RRR, no murmurs, rubs, or gallops. No clubbing, cyanosis, edema.  Radials/PT 2+ and equal bilaterally.  Respiratory:  Respirations regular and unlabored, clear to auscultation bilaterally. GI: Soft, nontender, nondistended. MS: No deformity or atrophy. Skin: Warm and dry, no rash. Neuro:  Strength and sensation are intact. Psych: Normal affect.  Assessment & Plan    Chronic systolic heart failure / ICM - EF 35-40%  Currently class 2-3. S/p revascularization in Dec 2022 of SVG to PDA. Volume status looks very good. Now on Toprol, aldactone. Unable to tolerate losartan even at low dose due to dizziness and syncope.   Unable to afford Entresto or SGLT 2 inhibitor.   Previously considered for Barostim trial but BNP was too high. Will continue current therapy  PAF / chronic anticoagulation - Denies palpitations. Denies bleeding complications on Eliquis. CHA2DS2-VASc Score = 5 [CHF History: 1, HTN History: 1, Diabetes History: 0, Stroke History: 0, Vascular Disease History: 1, Age Score: 2, Gender Score: 0].  Therefore, the patient's annual risk of stroke is 7.2 %.   In NSR today  HLD, LDL goal <70 - Continue Atorvastatin '80mg'$  QD. Denies myalgias. Last LDL 65. Will update labs today  CAD - Improved  anginal symptoms. Less sl Ntg use. S/p stent of SVG to PDA in Dec.   GDMT includes Plavix, Imdur, Ranexa, metoprolol, as needed nitroglycerin which historically he has used frequently.  Continue Plavix for 1 year from stent 12/27/20 (PCI of SVG-PDA and PL).  S/p CRT/ICD - Continue to follow with EP. Not candidate for Barostim due to elevated BNP. Close follow up since getting close to ERI  Severe AS S/p TAVR - Continue to monitor with periodic echo. Most recent echo March 18, 2021 with trivial AI, mild perivalvular leak, no significant stenosis   Disposition: Follow up in 6 months  Signed, Delita Chiquito [name], MD 01/22/2022, 8:43 AM [place]

## 2022-01-15 ENCOUNTER — Other Ambulatory Visit: Payer: Self-pay | Admitting: Cardiology

## 2022-01-22 ENCOUNTER — Ambulatory Visit: Payer: Medicare HMO | Attending: Cardiology | Admitting: Cardiology

## 2022-01-22 ENCOUNTER — Encounter: Payer: Self-pay | Admitting: Cardiology

## 2022-01-22 VITALS — BP 138/60 | HR 59 | Ht 69.0 in | Wt 184.4 lb

## 2022-01-22 DIAGNOSIS — I25118 Atherosclerotic heart disease of native coronary artery with other forms of angina pectoris: Secondary | ICD-10-CM

## 2022-01-22 DIAGNOSIS — I5022 Chronic systolic (congestive) heart failure: Secondary | ICD-10-CM | POA: Diagnosis not present

## 2022-01-22 DIAGNOSIS — I480 Paroxysmal atrial fibrillation: Secondary | ICD-10-CM | POA: Diagnosis not present

## 2022-01-22 DIAGNOSIS — Z952 Presence of prosthetic heart valve: Secondary | ICD-10-CM

## 2022-01-22 LAB — CBC WITH DIFFERENTIAL/PLATELET

## 2022-01-22 NOTE — Addendum Note (Signed)
Addended by: Kathyrn Lass on: 01/22/2022 08:48 AM   Modules accepted: Orders

## 2022-01-23 LAB — CBC WITH DIFFERENTIAL/PLATELET
Basophils Absolute: 0 10*3/uL (ref 0.0–0.2)
Basos: 1 %
EOS (ABSOLUTE): 0.1 10*3/uL (ref 0.0–0.4)
Eos: 1 %
Hematocrit: 38.9 % (ref 37.5–51.0)
Hemoglobin: 13.4 g/dL (ref 13.0–17.7)
Immature Grans (Abs): 0.1 10*3/uL (ref 0.0–0.1)
Immature Granulocytes: 1 %
Lymphocytes Absolute: 0.5 10*3/uL — ABNORMAL LOW (ref 0.7–3.1)
Lymphs: 7 %
MCH: 32 pg (ref 26.6–33.0)
MCHC: 34.4 g/dL (ref 31.5–35.7)
MCV: 93 fL (ref 79–97)
Monocytes Absolute: 0.9 10*3/uL (ref 0.1–0.9)
Monocytes: 12 %
Neutrophils Absolute: 5.9 10*3/uL (ref 1.4–7.0)
Neutrophils: 78 %
Platelets: 206 10*3/uL (ref 150–450)
RBC: 4.19 x10E6/uL (ref 4.14–5.80)
RDW: 13 % (ref 11.6–15.4)
WBC: 7.5 10*3/uL (ref 3.4–10.8)

## 2022-01-23 LAB — BASIC METABOLIC PANEL
BUN/Creatinine Ratio: 17 (ref 10–24)
BUN: 30 mg/dL — ABNORMAL HIGH (ref 8–27)
CO2: 25 mmol/L (ref 20–29)
Calcium: 9.6 mg/dL (ref 8.6–10.2)
Chloride: 101 mmol/L (ref 96–106)
Creatinine, Ser: 1.77 mg/dL — ABNORMAL HIGH (ref 0.76–1.27)
Glucose: 101 mg/dL — ABNORMAL HIGH (ref 70–99)
Potassium: 4.8 mmol/L (ref 3.5–5.2)
Sodium: 140 mmol/L (ref 134–144)
eGFR: 39 mL/min/{1.73_m2} — ABNORMAL LOW (ref >59)

## 2022-01-23 LAB — HEPATIC FUNCTION PANEL
ALT: 20 IU/L (ref 0–44)
AST: 31 IU/L (ref 0–40)
Albumin: 4.5 g/dL (ref 3.8–4.8)
Alkaline Phosphatase: 68 IU/L (ref 44–121)
Bilirubin Total: 1.7 mg/dL — ABNORMAL HIGH (ref 0.0–1.2)
Bilirubin, Direct: 0.4 mg/dL (ref 0.00–0.40)
Total Protein: 6.5 g/dL (ref 6.0–8.5)

## 2022-01-23 LAB — LIPID PANEL
Chol/HDL Ratio: 3.4 ratio (ref 0.0–5.0)
Cholesterol, Total: 158 mg/dL (ref 100–199)
HDL: 46 mg/dL (ref >39)
LDL Chol Calc (NIH): 93 mg/dL (ref 0–99)
Triglycerides: 106 mg/dL (ref 0–149)
VLDL Cholesterol Cal: 19 mg/dL (ref 5–40)

## 2022-01-26 ENCOUNTER — Other Ambulatory Visit: Payer: Self-pay

## 2022-01-26 DIAGNOSIS — I25119 Atherosclerotic heart disease of native coronary artery with unspecified angina pectoris: Secondary | ICD-10-CM

## 2022-01-26 DIAGNOSIS — I5022 Chronic systolic (congestive) heart failure: Secondary | ICD-10-CM

## 2022-01-26 DIAGNOSIS — E7800 Pure hypercholesterolemia, unspecified: Secondary | ICD-10-CM

## 2022-02-05 ENCOUNTER — Ambulatory Visit (INDEPENDENT_AMBULATORY_CARE_PROVIDER_SITE_OTHER): Payer: Medicare HMO

## 2022-02-05 DIAGNOSIS — I447 Left bundle-branch block, unspecified: Secondary | ICD-10-CM | POA: Diagnosis not present

## 2022-02-05 LAB — CUP PACEART REMOTE DEVICE CHECK
Battery Remaining Longevity: 3 mo
Battery Voltage: 2.78 V
Brady Statistic AP VP Percent: 73.51 %
Brady Statistic AP VS Percent: 0.02 %
Brady Statistic AS VP Percent: 26.23 %
Brady Statistic AS VS Percent: 0.24 %
Brady Statistic RA Percent Paced: 72.25 %
Brady Statistic RV Percent Paced: 98.29 %
Date Time Interrogation Session: 20240125052704
HighPow Impedance: 70 Ohm
Implantable Lead Connection Status: 753985
Implantable Lead Connection Status: 753985
Implantable Lead Connection Status: 753985
Implantable Lead Implant Date: 20161205
Implantable Lead Implant Date: 20161205
Implantable Lead Implant Date: 20161205
Implantable Lead Location: 753858
Implantable Lead Location: 753859
Implantable Lead Location: 753860
Implantable Lead Model: 4598
Implantable Lead Model: 5076
Implantable Lead Model: 6935
Implantable Pulse Generator Implant Date: 20161205
Lead Channel Impedance Value: 285 Ohm
Lead Channel Impedance Value: 342 Ohm
Lead Channel Impedance Value: 342 Ohm
Lead Channel Impedance Value: 342 Ohm
Lead Channel Impedance Value: 361 Ohm
Lead Channel Impedance Value: 418 Ohm
Lead Channel Impedance Value: 418 Ohm
Lead Channel Impedance Value: 456 Ohm
Lead Channel Impedance Value: 532 Ohm
Lead Channel Impedance Value: 532 Ohm
Lead Channel Impedance Value: 551 Ohm
Lead Channel Impedance Value: 589 Ohm
Lead Channel Impedance Value: 589 Ohm
Lead Channel Pacing Threshold Amplitude: 0.5 V
Lead Channel Pacing Threshold Amplitude: 0.625 V
Lead Channel Pacing Threshold Amplitude: 0.875 V
Lead Channel Pacing Threshold Pulse Width: 0.4 ms
Lead Channel Pacing Threshold Pulse Width: 0.4 ms
Lead Channel Pacing Threshold Pulse Width: 0.4 ms
Lead Channel Sensing Intrinsic Amplitude: 1 mV
Lead Channel Sensing Intrinsic Amplitude: 1 mV
Lead Channel Sensing Intrinsic Amplitude: 10.375 mV
Lead Channel Sensing Intrinsic Amplitude: 15.375 mV
Lead Channel Setting Pacing Amplitude: 2 V
Lead Channel Setting Pacing Amplitude: 2 V
Lead Channel Setting Pacing Amplitude: 2.5 V
Lead Channel Setting Pacing Pulse Width: 0.4 ms
Lead Channel Setting Pacing Pulse Width: 0.4 ms
Lead Channel Setting Sensing Sensitivity: 0.3 mV
Zone Setting Status: 755011
Zone Setting Status: 755011

## 2022-02-19 ENCOUNTER — Encounter (HOSPITAL_COMMUNITY): Payer: Self-pay | Admitting: *Deleted

## 2022-02-25 NOTE — Progress Notes (Signed)
Remote ICD transmission.   

## 2022-02-27 ENCOUNTER — Other Ambulatory Visit: Payer: Self-pay | Admitting: Cardiology

## 2022-02-27 DIAGNOSIS — I25708 Atherosclerosis of coronary artery bypass graft(s), unspecified, with other forms of angina pectoris: Secondary | ICD-10-CM

## 2022-03-10 DIAGNOSIS — R8271 Bacteriuria: Secondary | ICD-10-CM | POA: Diagnosis not present

## 2022-03-10 DIAGNOSIS — N3000 Acute cystitis without hematuria: Secondary | ICD-10-CM | POA: Diagnosis not present

## 2022-03-19 DIAGNOSIS — L308 Other specified dermatitis: Secondary | ICD-10-CM | POA: Diagnosis not present

## 2022-03-19 DIAGNOSIS — X32XXXD Exposure to sunlight, subsequent encounter: Secondary | ICD-10-CM | POA: Diagnosis not present

## 2022-03-19 DIAGNOSIS — L570 Actinic keratosis: Secondary | ICD-10-CM | POA: Diagnosis not present

## 2022-03-21 ENCOUNTER — Other Ambulatory Visit: Payer: Self-pay | Admitting: Cardiology

## 2022-03-21 DIAGNOSIS — I25708 Atherosclerosis of coronary artery bypass graft(s), unspecified, with other forms of angina pectoris: Secondary | ICD-10-CM

## 2022-03-21 DIAGNOSIS — I350 Nonrheumatic aortic (valve) stenosis: Secondary | ICD-10-CM

## 2022-04-17 ENCOUNTER — Telehealth: Payer: Self-pay

## 2022-04-17 NOTE — Telephone Encounter (Signed)
Outreach made to Pt.  Attempted to send manual transmission but got error code [id number].  Last transmission 01/2022 Pt with 3 months to ERI.  Scheduled follow up with RU for device check.  Pt has not heard tone indicating ERI yet.

## 2022-04-19 NOTE — Progress Notes (Unsigned)
Cardiology Office Note Date:  04/19/2022  Patient ID:  [name], DOB 09/03/1945, MRN [id number] PCP:  Corrie MckusickMiller, Susan, MD  Cardiologist:  Dr. SwazilandJordan Electrophysiologist: Dr. Ladona Ridgelaylor  ***refresh   Chief Complaint: *** over due, ? ERI  History of Present Illness: [name] is a 77 y.o. male with history of CAD (PCI >CABG 1996), HTN, HLD, LBBB, chronic CHF (systolic), ICM, VHD (s/p TAVR [id number]), barrett's esophagus, ICD, AFib  Saw Dr. Ladona Ridgelaylor last May 2022  He saw dr. SwazilandJordan Jan 2024, doing well, CP had improved, walking, stopped Ranexa thinking it was causing dizziness.   No changes were made.  *** symptoms *** ERI? *** AF burden *** eliquis, dose, bleeding, labs   Device information MDT CRT-D implanted 12/17/2014   Past Medical History:  Diagnosis Date   Aortic stenosis    Barrett's esophageal ulceration    Chronic systolic CHF (congestive heart failure) (HCC)    Coronary artery disease    a. CABG 1996: LIMA graft to the LAD and diagonal, sequential vein graft to the acute marginal, PDA, and posterior lateral branches of the right coronary, sequential saphenous vein graft to the obtuse marginal vessel and distal circumflex. b. Cath 2007 patent grafts. c. s/p DES to SVG-OM2 in 03/2014.   Coronary artery disease involving autologous vein coronary bypass graft with angina pectoris (HCC) 03/13/2014   PCI using DES in SVG to LCx system   Dental caries    pre-heart valve surgery protocol   Gastroesophageal reflux disease    History of erectile dysfunction    History of hiatal hernia    History of kidney stones    History of obesity    History of prostatitis    Hypercholesterolemia    Hypertension    Kidney stones    LBBB (left bundle branch block)    Nodule of kidney 02/04/2016   Incidental 8mm nodule upper pole right kidney noted on CT angiogram - MRI with and without gadolinium contrast recommened in 6 months   OSA (obstructive sleep apnea)    "suppose to wear  mask; I don't" (03/13/2014)   PVC's (premature ventricular contractions)    S/P CABG x 7 01/07/1995   LIMA to LAD-diagonal, SVG to OM-LCx, SVG to AM-PD-RPL   S/P TAVR (transcatheter aortic valve replacement) 02/18/2016   26 mm Edwards Sapien 3 transcatheter heart valve placed via percutaneous right transfemoral approach    Past Surgical History:  Procedure Laterality Date   CARDIAC CATHETERIZATION  1996; ~ 2006   ; Ejection fraction is estimated at 45%   CARDIAC CATHETERIZATION N/A 01/22/2016   Procedure: Right/Left Heart Cath and Coronary/Graft Angiography;  Surgeon: [name] SwazilandJordan, MD;  [facility];  Service: Cardiovascular;  Laterality: N/A;   CHOLECYSTECTOMY N/A 01/23/2014   Procedure: LAPAROSCOPIC CHOLECYSTECTOMY WITH INTRAOPERATIVE CHOLANGIOGRAM ;  Surgeon: Claud KelpHaywood Ingram, MD;  Location: [facility] OR;  Service: General;  Laterality: N/A;   COLONOSCOPY W/ POLYPECTOMY     CORONARY ANGIOPLASTY WITH STENT PLACEMENT  03/13/2014   "1"   CORONARY ARTERY BYPASS GRAFT  1996   "CABG X 7"   CORONARY STENT INTERVENTION N/A 12/26/2020   Procedure: CORONARY STENT INTERVENTION;  Surgeon: SwazilandJordan, [name], MD;  Location: [facility] INVASIVE CV LAB;  Service: Cardiovascular;  Laterality: N/A;   CYSTOSCOPY WITH STENT PLACEMENT     EP IMPLANTABLE DEVICE N/A 12/17/2014   Procedure: BiV ICD Insertion CRT-D;  Surgeon: Marinus MawGregg W Taylor, MD;  [facility];  Service: Cardiovascular;  Laterality: N/A;   LEFT HEART CATH AND CORS/GRAFTS ANGIOGRAPHY N/A 12/26/2020   Procedure: LEFT HEART CATH AND CORS/GRAFTS ANGIOGRAPHY;  Surgeon: Swaziland, [name], MD;  Location: [facility] INVASIVE CV LAB;  Service: Cardiovascular;  Laterality: N/A;   LEFT HEART CATHETERIZATION WITH CORONARY/GRAFT ANGIOGRAM N/A 03/13/2014   Procedure: LEFT HEART CATHETERIZATION WITH Isabel Caprice;  Surgeon: [name] Swaziland, MD;  Location[facility] CATH LAB;  Service: Cardiovascular;  Laterality: N/A;   MULTIPLE EXTRACTIONS WITH ALVEOLOPLASTY  N/A 02/07/2016   Procedure: Extraction of tooth #'s 1,2,3,14,15, 18, 20, 29,30,31 with alveoloplasty and dental cleaning of teeth.;  Surgeon: Charlynne Pander, DDS;  Location: [facility] OR;  Service: Oral Surgery;  Laterality: N/A;   TEE WITHOUT CARDIOVERSION N/A 02/18/2016   Procedure: TRANSESOPHAGEAL ECHOCARDIOGRAM (TEE);  Surgeon: Tonny Bollman, MD;  Location[facility] OR;  Service: Open Heart Surgery;  Laterality: N/A;   TONSILLECTOMY  ~ 1950   TRANSCATHETER AORTIC VALVE REPLACEMENT, TRANSFEMORAL N/A 02/18/2016   Procedure: TRANSCATHETER AORTIC VALVE REPLACEMENT, TRANSFEMORAL;  Surgeon: Tonny Bollman, MD;  Location[facility] OR;  Service: Open Heart Surgery;  Laterality: N/A;    Current Outpatient Medications  Medication Sig Dispense Refill   apixaban (ELIQUIS) 5 MG TABS tablet Take 1 tablet (5 mg total) by mouth 2 (two) times daily. 60 tablet 5   Ascorbic Acid (VITAMIN C) 500 MG CAPS Take 500 mg by mouth daily.     atorvastatin (LIPITOR) 80 MG tablet TAKE 1 TABLET BY MOUTH DAILY 90 tablet 3   Cholecalciferol (VITAMIN D-3) 5000 units TABS Take 5,000 Units by mouth daily.      clopidogrel (PLAVIX) 75 MG tablet TAKE ONE TABLET BY MOUTH DAILY 90 tablet 3   finasteride (PROSCAR) 5 MG tablet Take 5 mg by mouth daily.     furosemide (LASIX) 20 MG tablet Take 1 tablet (20 mg total) by mouth daily. Please keep scheduled appointment 90 tablet 3   gabapentin (NEURONTIN) 300 MG capsule Take 300 mg by mouth daily.     isosorbide mononitrate (IMDUR) 120 MG 24 hr tablet TAKE 1 TABLET BY MOUTH DAILY 90 tablet 3   metoprolol succinate (TOPROL-XL) 50 MG 24 hr tablet TAKE ONE TABLET BY MOUTH DAILY WITH OR IMMEDIATELY FOLLOWING A MEAL 90 tablet 3   nitroGLYCERIN (NITROSTAT) 0.4 MG SL tablet DISSOLVE 1 TAB UNDER TONGUE FOR CHEST PAIN - IF PAIN REMAINS AFTER 5 MIN, CALL 911 AND REPEAT DOSE. MAX 3 TABS IN 15 MINUTES 200 tablet 6   oxybutynin (DITROPAN) 5 MG tablet Take 5 mg by mouth daily.      pantoprazole (PROTONIX) 40 MG tablet  TAKE ONE TABLET BY MOUTH DAILY 90 tablet 3   ranolazine (RANEXA) 500 MG 12 hr tablet TAKE ONE TABLET BY MOUTH TWICE A DAY 180 tablet 3   spironolactone (ALDACTONE) 25 MG tablet TAKE 1/2 TABLET BY MOUTH DAILY 45 tablet 3   valACYclovir (VALTREX) 500 MG tablet TAKE ONE TABLET BY MOUTH DAILY AS NEEDED FOR OUTBREAK 30 tablet 0   vitamin B-12 (CYANOCOBALAMIN) 500 MCG tablet Take 500 mcg by mouth daily.     No current facility-administered medications for this visit.    Allergies:   Patient has no known allergies.   Social History:  The patient  reports that he quit smoking about 25 years ago. His smoking use included cigarettes. He has a 11.25 pack-year smoking history. He has never used smokeless tobacco. He reports that he does not drink alcohol and does not use drugs.  Family History:  The patient's family history includes Cancer in his sister; Heart attack in his father.  ROS:  Please see the history of present illness.    All other systems are reviewed and otherwise negative.   PHYSICAL EXAM:  VS:  There were no vitals taken for this visit. BMI: There is no height or weight on file to calculate BMI. Well nourished, well developed, in no acute distress HEENT: normocephalic, atraumatic Neck: no JVD, carotid bruits or masses Cardiac:  *** RRR; no significant murmurs, no rubs, or gallops Lungs:  *** CTA b/l, no wheezing, rhonchi or rales Abd: soft, nontender MS: no deformity or *** atrophy Ext: *** no edema Skin: warm and dry, no rash Neuro:  No gross deficits appreciated Psych: euthymic mood, full affect  *** ICD site is stable, no tethering or discomfort   EKG:  Done today and reviewed by myself shows  ***  Device interrogation done today and reviewed by myself:  ***   Cath 12/26/2020   Ost LM to Dist LM lesion is 100% stenosed.   Ost Cx to Dist Cx lesion is 100% stenosed.   Dist LM to Mid LAD lesion is 100% stenosed.   Ost RCA to Prox RCA lesion is 100% stenosed.    Origin to Prox Graft lesion before 1st Mrg  is 100% stenosed.   Origin to Prox Graft lesion before RPDA  is 95% stenosed.   A drug-eluting stent was successfully placed using a STENT ONYX FRONTIER 4.0X22.   Post intervention, there is a 20% residual stenosis.   LIMA graft was visualized by angiography and is normal in caliber.   SVG graft was visualized by angiography.   SVG graft was not visualized.   The graft exhibits no disease.   LV end diastolic pressure is moderately elevated.   Left main and RCA occlusive disease. The patient is graft dependent. Patent LIMA to the LAD Occluded SVG to OM1 and OM3. This is new since 2018. Some collaterals to OM3 Severe stenosis in sequential SVG to PDA and PL branches of the RCA. Also new since 2018. Moderately elevated LVEDP Successful PCI of the SVG to PDA and PL. Used distal protection and DES x 1. Lesion was quite resistant.    Plan: will observe overnight. Patient was given Brilinta in the lab but I would transition to Plavix in am since he is also on anticoagulation and cost is a major issue. Would give ASA for one month and Plavix for 12 months. May resume Eliquis tomorrow if no bleeding. Will give IV lasix x 1. Need to update Echo- this can be done as outpatient. Will again need to assess CHF therapy. Entresto and SGLT 2 inhibitors have been too expensive. Could consider aldactone.      Echo 12/27/2020  1. Left ventricular ejection fraction by 2D MOD biplane is 30.6 %. Left  ventricular diastolic parameters are consistent with Grade I diastolic  dysfunction (impaired relaxation).   2. Right ventricular systolic function was not well visualized. There is  normal pulmonary artery systolic pressure.   3. Mild paravalvular leak is present, mildy worsened from prior echo  06/25/2020. No dehiscence. AV peak velocity 1.4 m/s; peak gradient 8.5  mmHg. Aortic valve regurgitation is trivial. There is a 26 mm Sapien  prosthetic (TAVR) valve present in  the aortic   position. Procedure Date: 02/18/2016.    Echo 03/18/21:  IMPRESSIONS  1. Left ventricular ejection fraction, by estimation, is 50 to 55%. The  left ventricle has low normal function. The left ventricle demonstrates  regional wall motion abnormalities (see scoring diagram/findings for  description). The left ventricular  internal cavity size was mildly dilated. There is mild left ventricular  hypertrophy. Left ventricular diastolic parameters are consistent with  Grade III diastolic dysfunction (restrictive). Elevated left ventricular  end-diastolic pressure. There is  severe hypokinesis of the left ventricular, entire inferior wall.   2. Right ventricular systolic function is mildly reduced. The right  ventricular size is mildly enlarged. There is normal pulmonary artery  systolic pressure.   3. Left atrial size was moderately dilated.   4. The mitral valve is grossly normal. Mild to moderate mitral valve  regurgitation.   5. Tricuspid valve regurgitation is moderate.   6. S/p TAVR . There is a trivial perivalvular leak . The aortic valve has  been repaired/replaced. Aortic valve regurgitation is trivial. Procedure  Date: 02/18/2016.   Recent Labs: 01/22/2022: ALT 20; BUN 30; Creatinine, Ser 1.77; Hemoglobin 13.4; Platelets 206; Potassium 4.8; Sodium 140  01/22/2022: Chol/HDL Ratio 3.4; Cholesterol, Total 158; HDL 46; LDL Chol Calc (NIH) 93; Triglycerides 106   CrCl cannot be calculated (Patient's most recent lab result is older than the maximum 21 days allowed.).   Wt Readings from Last 3 Encounters:  01/22/22 184 lb 6.4 oz (83.6 kg)  08/11/21 183 lb (83 kg)  04/09/21 192 lb 4 oz (87.2 kg)     Other studies reviewed: Additional studies/records reviewed today include: summarized above  ASSESSMENT AND PLAN:  ICD ***  Paroxysmal Afib CHA2DS2Vasc is 5, on Eliquis, appropriately dosed *** % burden  CAD ***  ICM Chronic CHF ***  VHD S/p TAVR Functioning  well, trivial perivalvular leak ***  Secondary hypercoagulable state  Disposition: F/u with ***  Current medicines are reviewed at length with the patient today.  The patient did not have any concerns regarding medicines.  Norma Fredrickson, PA-C 04/19/2022 2:25 PM     [facility] [address] [place] [address] [phone] (office)  [phone] (fax)

## 2022-04-20 DIAGNOSIS — L851 Acquired keratosis [keratoderma] palmaris et plantaris: Secondary | ICD-10-CM | POA: Diagnosis not present

## 2022-04-20 DIAGNOSIS — G629 Polyneuropathy, unspecified: Secondary | ICD-10-CM | POA: Diagnosis not present

## 2022-04-20 DIAGNOSIS — Q667 Congenital pes cavus, unspecified foot: Secondary | ICD-10-CM | POA: Diagnosis not present

## 2022-04-20 DIAGNOSIS — M2041 Other hammer toe(s) (acquired), right foot: Secondary | ICD-10-CM | POA: Diagnosis not present

## 2022-04-20 DIAGNOSIS — M792 Neuralgia and neuritis, unspecified: Secondary | ICD-10-CM | POA: Diagnosis not present

## 2022-04-22 ENCOUNTER — Ambulatory Visit: Payer: Medicare HMO | Attending: Physician Assistant | Admitting: Physician Assistant

## 2022-04-22 ENCOUNTER — Encounter: Payer: Self-pay | Admitting: Physician Assistant

## 2022-04-22 VITALS — BP 102/60 | HR 60 | Ht 69.0 in | Wt 183.0 lb

## 2022-04-22 DIAGNOSIS — I2510 Atherosclerotic heart disease of native coronary artery without angina pectoris: Secondary | ICD-10-CM | POA: Diagnosis not present

## 2022-04-22 DIAGNOSIS — I5022 Chronic systolic (congestive) heart failure: Secondary | ICD-10-CM | POA: Diagnosis not present

## 2022-04-22 DIAGNOSIS — D6869 Other thrombophilia: Secondary | ICD-10-CM | POA: Diagnosis not present

## 2022-04-22 DIAGNOSIS — I255 Ischemic cardiomyopathy: Secondary | ICD-10-CM | POA: Diagnosis not present

## 2022-04-22 DIAGNOSIS — Z952 Presence of prosthetic heart valve: Secondary | ICD-10-CM | POA: Diagnosis not present

## 2022-04-22 DIAGNOSIS — I480 Paroxysmal atrial fibrillation: Secondary | ICD-10-CM | POA: Diagnosis not present

## 2022-04-22 DIAGNOSIS — Z95810 Presence of automatic (implantable) cardiac defibrillator: Secondary | ICD-10-CM | POA: Diagnosis not present

## 2022-04-22 LAB — CUP PACEART INCLINIC DEVICE CHECK
Battery Remaining Longevity: 1 mo
Battery Voltage: 2.74 V
Brady Statistic AP VP Percent: 76.15 %
Brady Statistic AP VS Percent: 0.02 %
Brady Statistic AS VP Percent: 23.66 %
Brady Statistic AS VS Percent: 0.17 %
Brady Statistic RA Percent Paced: 75.01 %
Brady Statistic RV Percent Paced: 98.72 %
Date Time Interrogation Session: 20240410114701
HighPow Impedance: 74 Ohm
Implantable Lead Connection Status: 753985
Implantable Lead Connection Status: 753985
Implantable Lead Connection Status: 753985
Implantable Lead Implant Date: 20161205
Implantable Lead Implant Date: 20161205
Implantable Lead Implant Date: 20161205
Implantable Lead Location: 753858
Implantable Lead Location: 753859
Implantable Lead Location: 753860
Implantable Lead Model: 4598
Implantable Lead Model: 5076
Implantable Lead Model: 6935
Implantable Pulse Generator Implant Date: 20161205
Lead Channel Impedance Value: 304 Ohm
Lead Channel Impedance Value: 342 Ohm
Lead Channel Impedance Value: 342 Ohm
Lead Channel Impedance Value: 361 Ohm
Lead Channel Impedance Value: 418 Ohm
Lead Channel Impedance Value: 456 Ohm
Lead Channel Impedance Value: 456 Ohm
Lead Channel Impedance Value: 475 Ohm
Lead Channel Impedance Value: 532 Ohm
Lead Channel Impedance Value: 532 Ohm
Lead Channel Impedance Value: 551 Ohm
Lead Channel Impedance Value: 551 Ohm
Lead Channel Impedance Value: 608 Ohm
Lead Channel Pacing Threshold Amplitude: 0.625 V
Lead Channel Pacing Threshold Amplitude: 0.625 V
Lead Channel Pacing Threshold Amplitude: 1 V
Lead Channel Pacing Threshold Pulse Width: 0.4 ms
Lead Channel Pacing Threshold Pulse Width: 0.4 ms
Lead Channel Pacing Threshold Pulse Width: 0.4 ms
Lead Channel Sensing Intrinsic Amplitude: 1.375 mV
Lead Channel Sensing Intrinsic Amplitude: 1.625 mV
Lead Channel Sensing Intrinsic Amplitude: 7.375 mV
Lead Channel Sensing Intrinsic Amplitude: 7.375 mV
Lead Channel Setting Pacing Amplitude: 2 V
Lead Channel Setting Pacing Amplitude: 2 V
Lead Channel Setting Pacing Amplitude: 2.5 V
Lead Channel Setting Pacing Pulse Width: 0.4 ms
Lead Channel Setting Pacing Pulse Width: 0.4 ms
Lead Channel Setting Sensing Sensitivity: 0.3 mV
Zone Setting Status: 755011
Zone Setting Status: 755011

## 2022-04-22 NOTE — Patient Instructions (Signed)
Medication Instructions:  Your physician recommends that you continue on your current medications as directed. Please refer to the Current Medication list given to you today.  *If you need a refill on your cardiac medications before your next appointment, please call your pharmacy*   Lab Work: None ordered   Testing/Procedures: None ordered   Follow-Up: At [facility], you and your health needs are our priority.  As part of our continuing mission to provide you with exceptional heart care, we have created designated Provider Care Teams.  These Care Teams include your primary Cardiologist (physician) and Advanced Practice Providers (APPs -  Physician Assistants and Nurse Practitioners) who all work together to provide you with the care you need, when you need it.  We recommend signing up for the patient portal called "MyChart".  Sign up information is provided on this After Visit Summary.  MyChart is used to connect with patients for Virtual Visits (Telemedicine).  Patients are able to view lab/test results, encounter notes, upcoming appointments, etc.  Non-urgent messages can be sent to your provider as well.   To learn more about what you can do with MyChart, go to [email or web address].    Your next appointment:   6 week(s)  The format for your next appointment:   In Person  Provider:   You may see Lewayne Bunting, MD or one of the following Advanced Practice Providers on your designated Care Team:   Francis Dowse, [place] Casimiro Needle "Mardelle Matte" Lanna Poche, [place]   Thank you for choosing [facility]!!    [phone]

## 2022-04-23 NOTE — Telephone Encounter (Signed)
The patient should receive it in 7-10 business days.

## 2022-05-07 ENCOUNTER — Ambulatory Visit (INDEPENDENT_AMBULATORY_CARE_PROVIDER_SITE_OTHER): Payer: Medicare HMO

## 2022-05-07 DIAGNOSIS — I255 Ischemic cardiomyopathy: Secondary | ICD-10-CM

## 2022-05-07 LAB — CUP PACEART REMOTE DEVICE CHECK
Battery Remaining Longevity: 1 mo
Battery Voltage: 2.73 V
Brady Statistic AP VP Percent: 80.17 %
Brady Statistic AP VS Percent: 0.01 %
Brady Statistic AS VP Percent: 19.8 %
Brady Statistic AS VS Percent: 0.02 %
Brady Statistic RA Percent Paced: 79.48 %
Brady Statistic RV Percent Paced: 99 %
Date Time Interrogation Session: 20240425012405
HighPow Impedance: 69 Ohm
Implantable Lead Connection Status: 753985
Implantable Lead Connection Status: 753985
Implantable Lead Connection Status: 753985
Implantable Lead Implant Date: 20161205
Implantable Lead Implant Date: 20161205
Implantable Lead Implant Date: 20161205
Implantable Lead Location: 753858
Implantable Lead Location: 753859
Implantable Lead Location: 753860
Implantable Lead Model: 4598
Implantable Lead Model: 5076
Implantable Lead Model: 6935
Implantable Pulse Generator Implant Date: 20161205
Lead Channel Impedance Value: 285 Ohm
Lead Channel Impedance Value: 342 Ohm
Lead Channel Impedance Value: 342 Ohm
Lead Channel Impedance Value: 361 Ohm
Lead Channel Impedance Value: 361 Ohm
Lead Channel Impedance Value: 418 Ohm
Lead Channel Impedance Value: 418 Ohm
Lead Channel Impedance Value: 456 Ohm
Lead Channel Impedance Value: 532 Ohm
Lead Channel Impedance Value: 532 Ohm
Lead Channel Impedance Value: 551 Ohm
Lead Channel Impedance Value: 608 Ohm
Lead Channel Impedance Value: 608 Ohm
Lead Channel Pacing Threshold Amplitude: 0.5 V
Lead Channel Pacing Threshold Amplitude: 0.625 V
Lead Channel Pacing Threshold Amplitude: 1 V
Lead Channel Pacing Threshold Pulse Width: 0.4 ms
Lead Channel Pacing Threshold Pulse Width: 0.4 ms
Lead Channel Pacing Threshold Pulse Width: 0.4 ms
Lead Channel Sensing Intrinsic Amplitude: 1.25 mV
Lead Channel Sensing Intrinsic Amplitude: 1.25 mV
Lead Channel Sensing Intrinsic Amplitude: 7.375 mV
Lead Channel Sensing Intrinsic Amplitude: 7.375 mV
Lead Channel Setting Pacing Amplitude: 2 V
Lead Channel Setting Pacing Amplitude: 2 V
Lead Channel Setting Pacing Amplitude: 2.5 V
Lead Channel Setting Pacing Pulse Width: 0.4 ms
Lead Channel Setting Pacing Pulse Width: 0.4 ms
Lead Channel Setting Sensing Sensitivity: 0.3 mV
Zone Setting Status: 755011
Zone Setting Status: 755011

## 2022-05-18 ENCOUNTER — Telehealth: Payer: Self-pay

## 2022-05-18 ENCOUNTER — Telehealth: Payer: Self-pay | Admitting: Cardiology

## 2022-05-18 DIAGNOSIS — Z01812 Encounter for preprocedural laboratory examination: Secondary | ICD-10-CM

## 2022-05-18 NOTE — Telephone Encounter (Signed)
Message received from Device RN, Pt MDT ICD at [facility].    Ms. [name] called, procedure scheduling.  Pt scheduled for 06/02/2022 at 130 pm for MDT ICD gen change; DX ERI, Cpt code [address].    Per notes from [facility] PA-C, 04/22/2022; Pt should hold Plavix and Eliquis 2 days prior to procedure.   Pt called and ok with procedure day and time.  Pt and spouse requested lab draw on 05/19/22 at 1000 am;  They will pick up procedure letter at that time and surgical scrub.

## 2022-05-18 NOTE — Telephone Encounter (Signed)
Following alert received from [employer] received for ICD reached RRT 05/18/22.   Pt had recent OV with Renee and discussed procedure at visit.  Routing for scheduling.

## 2022-05-18 NOTE — Telephone Encounter (Signed)
Patient states he is in the donut hole for this medication.  He states insurance is asking for the "doctor": to call them at  [phone].  Advised  we will let him know

## 2022-05-18 NOTE — Telephone Encounter (Signed)
Pt c/o medication issue:  1. Name of Medication:  apixaban (ELIQUIS) 5 MG TABS tablet  2. How are you currently taking this medication (dosage and times per day)? Take 1 tablet (5 mg total) by mouth 2 (two) times daily.   3. Are you having a reaction (difficulty breathing--STAT)? No   4. What is your medication issue? Patient said that we would have to call [facility] for the Eliquis

## 2022-05-19 ENCOUNTER — Ambulatory Visit: Payer: Medicare HMO | Attending: Internal Medicine

## 2022-05-19 DIAGNOSIS — Z01812 Encounter for preprocedural laboratory examination: Secondary | ICD-10-CM

## 2022-05-19 DIAGNOSIS — I255 Ischemic cardiomyopathy: Secondary | ICD-10-CM | POA: Diagnosis not present

## 2022-05-19 DIAGNOSIS — I5022 Chronic systolic (congestive) heart failure: Secondary | ICD-10-CM | POA: Diagnosis not present

## 2022-05-19 LAB — CBC WITH DIFFERENTIAL/PLATELET

## 2022-05-19 LAB — BASIC METABOLIC PANEL
BUN: 22 mg/dL (ref 8–27)
Calcium: 9.5 mg/dL (ref 8.6–10.2)
Chloride: 102 mmol/L (ref 96–106)
Creatinine, Ser: 1.63 mg/dL — ABNORMAL HIGH (ref 0.76–1.27)
Potassium: 4.4 mmol/L (ref 3.5–5.2)

## 2022-05-19 NOTE — Telephone Encounter (Signed)
Spoke with the pt and gave the information from the pharmacist- see last message. Gave the number to call. Also instructed pt; that if his insurance company is requesting anything from [place], then ask them what it is they need and give [place] a fax or email to send over the information to them . The pt verbalized understanding.

## 2022-05-19 NOTE — Telephone Encounter (Signed)
[facility] [address] for pre-procedure lab draw for upcoming Gen Change scheduled for 06/02/22 at 130 pm.    I met with Pt in Pod M, Exam 1;  Upon educating Pt regarding pre-procedure letter and how to use surgical scrub, Pt and spouse expressed frustration about their home device monitor, and last experience with nursing care received at [facility].  Pt procedure letter briefly discussed, as they had no questions.    Pt and spouse are frustrated with "OLD" [employer] device monitor.  Pt stated they were given an external hard drive to use in their computer at home, and it is no longer working?  Pt and spouse want a NEW monitor with this Generator change, and would like to speak with [employer] while in the hospital.  I advised Ms. Evans Lance of our Device Team at [address],  their concerns; She stated that this would be addressed AFTER the Gen Change, in the hospital, and an App or New Monitor can be discussed with [employer] during that point in time. Device Team told I would advise the Pt.   I apologized for the concerns / difficulties the Pt and wife experienced with the monitor, external hard drive, and advised them of what our Device Team shared with me;  I assured them that their concern will be addressed while in the hospital, and their request for a NEW monitor from [employer].    I will forward a message to Device as a heads up to their concerns.

## 2022-05-19 NOTE — Telephone Encounter (Signed)
If he's in the donut hole with his Eliquis, we don't have a way to lower the price by calling his insurance.    There is a program that [facility] specifically is offering for some of their plans where he can call in, enroll and have a consultation with one of their clinical pharmacists, and then get Eliquis much cheaper. I don't see his card scanned in for 2024 so am not 100% sure if his plan is one of them, but he should definitely give it a shot. Their # is [phone] and it's the [facility] Anticoagulant Savings Program. If his plan participates, he can get Eliquis for free as a 3 month supply at [facility] pharmacy, or $10 for a 1 month supply at another [employer].    Called to give the above message, no answer, left message asking to call back and given #

## 2022-05-19 NOTE — Telephone Encounter (Signed)
If he's in the donut hole with his Eliquis, we don't have a way to lower the price by calling his insurance.   There is a program that [facility] specifically is offering for some of their plans where he can call in, enroll and have a consultation with one of their clinical pharmacists, and then get Eliquis much cheaper. I don't see his card scanned in for 2024 so am not 100% sure if his plan is one of them, but he should definitely give it a shot. Their # is [phone] and it's the [facility] Anticoagulant Savings Program. If his plan participates, he can get Eliquis for free as a 3 month supply at [facility] pharmacy, or $10 for a 1 month supply at another [employer].

## 2022-05-20 ENCOUNTER — Telehealth: Payer: Self-pay

## 2022-05-20 ENCOUNTER — Telehealth: Payer: Self-pay | Admitting: *Deleted

## 2022-05-20 ENCOUNTER — Other Ambulatory Visit (HOSPITAL_COMMUNITY): Payer: Self-pay

## 2022-05-20 LAB — CBC WITH DIFFERENTIAL/PLATELET
EOS (ABSOLUTE): 0.1 10*3/uL (ref 0.0–0.4)
Eos: 1 %
Hemoglobin: 13.1 g/dL (ref 13.0–17.7)
Immature Grans (Abs): 0.1 10*3/uL (ref 0.0–0.1)
Immature Granulocytes: 1 %
Lymphocytes Absolute: 0.5 10*3/uL — ABNORMAL LOW (ref 0.7–3.1)
Lymphs: 6 %
MCH: 31.5 pg (ref 26.6–33.0)
Monocytes Absolute: 0.8 10*3/uL (ref 0.1–0.9)
Monocytes: 10 %
Neutrophils Absolute: 6.2 10*3/uL (ref 1.4–7.0)
Neutrophils: 82 %
Platelets: 187 10*3/uL (ref 150–450)
RBC: 4.16 x10E6/uL (ref 4.14–5.80)
RDW: 13.7 % (ref 11.6–15.4)
WBC: 7.6 10*3/uL (ref 3.4–10.8)

## 2022-05-20 LAB — BASIC METABOLIC PANEL
BUN/Creatinine Ratio: 13 (ref 10–24)
CO2: 24 mmol/L (ref 20–29)
Glucose: 94 mg/dL (ref 70–99)
Sodium: 139 mmol/L (ref 134–144)
eGFR: 43 mL/min/{1.73_m2} — ABNORMAL LOW (ref >59)

## 2022-05-20 NOTE — Telephone Encounter (Signed)
Pharmacy Patient Advocate Encounter  Prior Authorization for Eliquis 5mg  has been approved by [employer] (ins).    Key [id number]  Effective dates: 1.1.24 through 12.31.24

## 2022-05-20 NOTE — Telephone Encounter (Signed)
Received a fax from [facility] [address] [place], [place] stating "please submit prior authorization for Eliquis 5mg  tablet. Will forward this to the Prior Auth Team for assistance with Prior Authorization.

## 2022-05-20 NOTE — Telephone Encounter (Signed)
Pharmacy Patient Advocate Encounter   Received notification from [facility] that prior authorization for [facility] is needed.    PA submitted on 05/20/22 Key [id number] Status is pending  Haze Rushing, CPhT Pharmacy Patient Advocate Specialist Direct Number: [phone] Fax: [phone]

## 2022-05-20 NOTE — Telephone Encounter (Signed)
Pending, please see separate encounter with updates on determination

## 2022-05-29 ENCOUNTER — Telehealth: Payer: Self-pay | Admitting: Internal Medicine

## 2022-05-29 NOTE — Telephone Encounter (Signed)
Reviewed with EP scheduler.  Pt does not need appointment with Renee on 5/22 since he is having a changeout on 06/02/22.  Appointment has been cancelled.  Pt concerned that he was to receive a new home monitoring device over the last few weeks and has not gotten anything.  He said Dr. Lubertha Basque nurse was going to speak with device clinic about this and he would like to follow up.   He would like for someone to give him a call back re: this as soon as possible.

## 2022-05-29 NOTE — Telephone Encounter (Signed)
LMOVM letting patient know that it is best to wait until he get his device changed out to get a new monitor.

## 2022-05-29 NOTE — Telephone Encounter (Signed)
Pt would ike a callback from nurse regarding appt on 5/22. Pt would ike to know if appt should be rescheduled due to having Cath/ Change Out on 5/21. Pt also has questions regarding bedside monitor for device. Please advise

## 2022-06-01 NOTE — Pre-Procedure Instructions (Signed)
Instructed patient on the following items: Arrival time 1130 Nothing to eat or drink after midnight No meds AM of procedure Responsible person to drive you home and stay with you for 24 hrs  Have you missed any doses of anti-coagulant Eliquis-Plavix- last dose was 5/18.

## 2022-06-02 ENCOUNTER — Ambulatory Visit (HOSPITAL_COMMUNITY)
Admission: RE | Admit: 2022-06-02 | Discharge: 2022-06-02 | Disposition: A | Discharge status: Home / Self Care | Payer: Medicare HMO | Attending: Internal Medicine | Admitting: Internal Medicine

## 2022-06-02 ENCOUNTER — Other Ambulatory Visit: Payer: Self-pay

## 2022-06-02 ENCOUNTER — Encounter (HOSPITAL_COMMUNITY)
Admission: RE | Disposition: A | Discharge status: Home / Self Care | Payer: Self-pay | Source: Home / Self Care | Attending: Internal Medicine

## 2022-06-02 DIAGNOSIS — Z8249 Family history of ischemic heart disease and other diseases of the circulatory system: Secondary | ICD-10-CM | POA: Insufficient documentation

## 2022-06-02 DIAGNOSIS — D6869 Other thrombophilia: Secondary | ICD-10-CM | POA: Diagnosis not present

## 2022-06-02 DIAGNOSIS — Z87891 Personal history of nicotine dependence: Secondary | ICD-10-CM | POA: Diagnosis not present

## 2022-06-02 DIAGNOSIS — Z4502 Encounter for adjustment and management of automatic implantable cardiac defibrillator: Secondary | ICD-10-CM | POA: Diagnosis not present

## 2022-06-02 DIAGNOSIS — I442 Atrioventricular block, complete: Secondary | ICD-10-CM | POA: Diagnosis not present

## 2022-06-02 DIAGNOSIS — I2510 Atherosclerotic heart disease of native coronary artery without angina pectoris: Secondary | ICD-10-CM | POA: Diagnosis not present

## 2022-06-02 DIAGNOSIS — I255 Ischemic cardiomyopathy: Secondary | ICD-10-CM | POA: Insufficient documentation

## 2022-06-02 DIAGNOSIS — I480 Paroxysmal atrial fibrillation: Secondary | ICD-10-CM | POA: Insufficient documentation

## 2022-06-02 DIAGNOSIS — I5022 Chronic systolic (congestive) heart failure: Secondary | ICD-10-CM | POA: Insufficient documentation

## 2022-06-02 DIAGNOSIS — I110 Hypertensive heart disease with heart failure: Secondary | ICD-10-CM | POA: Diagnosis not present

## 2022-06-02 DIAGNOSIS — Z952 Presence of prosthetic heart valve: Secondary | ICD-10-CM | POA: Diagnosis not present

## 2022-06-02 DIAGNOSIS — Z7901 Long term (current) use of anticoagulants: Secondary | ICD-10-CM | POA: Insufficient documentation

## 2022-06-02 HISTORY — PX: ICD GENERATOR CHANGEOUT: EP1231

## 2022-06-02 SURGERY — ICD GENERATOR CHANGEOUT

## 2022-06-02 MED ORDER — SODIUM CHLORIDE 0.9 % IV SOLN
INTRAVENOUS | Status: AC
  Administered 2022-06-02: 80 mg
  Filled 2022-06-02: qty 2

## 2022-06-02 MED ORDER — ONDANSETRON HCL 4 MG/2ML IJ SOLN: 4.0000 mg | Freq: Four times a day (QID) | INTRAMUSCULAR | Status: DC | PRN

## 2022-06-02 MED ORDER — CHLORHEXIDINE GLUCONATE 4 % EX SOLN
4.0000 | Freq: Once | CUTANEOUS | Status: DC
  Filled 2022-06-02: qty 60

## 2022-06-02 MED ORDER — POVIDONE-IODINE 10 % EX SWAB
2.0000 | Freq: Once | CUTANEOUS | Status: AC
  Administered 2022-06-02: 2 via TOPICAL

## 2022-06-02 MED ORDER — ACETAMINOPHEN 325 MG PO TABS: 325.0000 mg | ORAL_TABLET | ORAL | Status: DC | PRN

## 2022-06-02 MED ORDER — LIDOCAINE HCL (PF) 1 % IJ SOLN
INTRAMUSCULAR | Status: DC | PRN
  Administered 2022-06-02: 60 mL

## 2022-06-02 MED ORDER — CEFAZOLIN SODIUM-DEXTROSE 2-4 GM/100ML-% IV SOLN
INTRAVENOUS | Status: AC
  Filled 2022-06-02: qty 100

## 2022-06-02 MED ORDER — SODIUM CHLORIDE 0.9 % IV SOLN: 80.0000 mg | INTRAVENOUS | Status: AC

## 2022-06-02 MED ORDER — CEFAZOLIN SODIUM-DEXTROSE 2-4 GM/100ML-% IV SOLN
2.0000 g | INTRAVENOUS | Status: AC
  Administered 2022-06-02: 2 g via INTRAVENOUS

## 2022-06-02 MED ORDER — LIDOCAINE HCL (PF) 1 % IJ SOLN
INTRAMUSCULAR | Status: AC
  Filled 2022-06-02: qty 60

## 2022-06-02 MED ORDER — DOXYCYCLINE HYCLATE 50 MG PO CAPS: 100.0000 mg | ORAL_CAPSULE | Freq: Two times a day (BID) | ORAL | 0 refills | Status: AC

## 2022-06-02 MED ORDER — HEPARIN (PORCINE) IN NACL 1000-0.9 UT/500ML-% IV SOLN
INTRAVENOUS | Status: DC | PRN
  Administered 2022-06-02: 500 mL

## 2022-06-02 MED ORDER — SODIUM CHLORIDE 0.9 % IV SOLN: INTRAVENOUS | Status: DC

## 2022-06-02 SURGICAL SUPPLY — 6 items
CABLE SURGICAL S-101-97-12 (CABLE) ×1 IMPLANT
ICD COBALT XT QUAD CRT DTPA2Q1 (ICD Generator) IMPLANT
PAD DEFIB RADIO PHYSIO CONN (PAD) ×1 IMPLANT
POUCH AIGIS-R ANTIBACT ICD (Mesh General) ×1 IMPLANT
POUCH AIGIS-R ANTIBACT ICD LRG (Mesh General) IMPLANT
TRAY PACEMAKER INSERTION (PACKS) ×1 IMPLANT

## 2022-06-02 NOTE — H&P (Signed)
Cardiology Office Note Date:  04/19/2022  Patient ID:  [name], [name] [birth year], MRN [id number] PCP:  Corrie Mckusick, MD          Cardiologist:  Dr. Swaziland Electrophysiologist: Dr. Ladona Ridgel     Chief Complaint:  over due, ? ERI   History of Present Illness: [name] is a 77 y.o. male with history of CAD (PCI >CABG 1996), HTN, HLD, LBBB, chronic CHF (systolic), ICM, VHD (s/p TAVR 4098), barrett's esophagus, ICD, AFib   Saw Dr. Ladona Ridgel last May 2022   He saw Dr. Swaziland Jan 2024, doing well, CP had improved, walking, stopped Ranexa thinking it was causing dizziness.   No changes were made.   TODAY He is all in all doing well. No changes or new symptoms since he saw Dr. Swaziland. No bleeding or signs of bleeding     Device information MDT CRT-D implanted 12/17/2014         Past Medical History:  Diagnosis Date   Aortic stenosis     Barrett's esophageal ulceration     Chronic systolic CHF (congestive heart failure) (HCC)     Coronary artery disease      a. CABG 1996: LIMA graft to the LAD and diagonal, sequential vein graft to the acute marginal, PDA, and posterior lateral branches of the right coronary, sequential saphenous vein graft to the obtuse marginal vessel and distal circumflex. b. Cath 2007 patent grafts. c. s/p DES to SVG-OM2 in 03/2014.   Coronary artery disease involving autologous vein coronary bypass graft with angina pectoris (HCC) 03/13/2014    PCI using DES in SVG to LCx system   Dental caries      pre-heart valve surgery protocol   Gastroesophageal reflux disease     History of erectile dysfunction     History of hiatal hernia     History of kidney stones     History of obesity     History of prostatitis     Hypercholesterolemia     Hypertension     Kidney stones     LBBB (left bundle branch block)     Nodule of kidney 02/04/2016    Incidental 8mm nodule upper pole right kidney noted on CT angiogram - MRI with and without gadolinium contrast  recommened in 6 months   OSA (obstructive sleep apnea)      "suppose to wear mask; I don't" (03/13/2014)   PVC's (premature ventricular contractions)     S/P CABG x 7 01/07/1995    LIMA to LAD-diagonal, SVG to OM-LCx, SVG to AM-PD-RPL   S/P TAVR (transcatheter aortic valve replacement) 02/18/2016    26 mm Edwards Sapien 3 transcatheter heart valve placed via percutaneous right transfemoral approach           Past Surgical History:  Procedure Laterality Date   CARDIAC CATHETERIZATION   1996; ~ 2006    ; Ejection fraction is estimated at 45%   CARDIAC CATHETERIZATION N/A 01/22/2016    Procedure: Right/Left Heart Cath and Coronary/Graft Angiography;  Surgeon: [name] Swaziland, MD;  Location: [facility] INVASIVE CV LAB;  Service: Cardiovascular;  Laterality: N/A;   CHOLECYSTECTOMY N/A 01/23/2014    Procedure: LAPAROSCOPIC CHOLECYSTECTOMY WITH INTRAOPERATIVE CHOLANGIOGRAM ;  Surgeon: Claud Kelp, MD;  Location: [facility] OR;  Service: General;  Laterality: N/A;   COLONOSCOPY W/ POLYPECTOMY       CORONARY ANGIOPLASTY WITH STENT PLACEMENT   03/13/2014    "1"   CORONARY ARTERY BYPASS GRAFT  1996    "CABG X 7"   CORONARY STENT INTERVENTION N/A 12/26/2020    Procedure: CORONARY STENT INTERVENTION;  Surgeon: Swaziland, [name], MD;  Location: [facility] INVASIVE CV LAB;  Service: Cardiovascular;  Laterality: N/A;   CYSTOSCOPY WITH STENT PLACEMENT       EP IMPLANTABLE DEVICE N/A 12/17/2014    Procedure: BiV ICD Insertion CRT-D;  Surgeon: Marinus Maw, MD;  Location: [facility] INVASIVE CV LAB;  Service: Cardiovascular;  Laterality: N/A;   LEFT HEART CATH AND CORS/GRAFTS ANGIOGRAPHY N/A 12/26/2020    Procedure: LEFT HEART CATH AND CORS/GRAFTS ANGIOGRAPHY;  Surgeon: Swaziland, [name], MD;  Location: [facility] INVASIVE CV LAB;  Service: Cardiovascular;  Laterality: N/A;   LEFT HEART CATHETERIZATION WITH CORONARY/GRAFT ANGIOGRAM N/A 03/13/2014    Procedure: LEFT HEART CATHETERIZATION WITH Isabel Caprice;  Surgeon: [name] Swaziland, MD;   Location: [facility] CATH LAB;  Service: Cardiovascular;  Laterality: N/A;   MULTIPLE EXTRACTIONS WITH ALVEOLOPLASTY N/A 02/07/2016    Procedure: Extraction of tooth #'s 1,2,3,14,15, 18, 20, 29,30,31 with alveoloplasty and dental cleaning of teeth.;  Surgeon: Charlynne Pander, DDS;  Location: [facility] OR;  Service: Oral Surgery;  Laterality: N/A;   TEE WITHOUT CARDIOVERSION N/A 02/18/2016    Procedure: TRANSESOPHAGEAL ECHOCARDIOGRAM (TEE);  Surgeon: Tonny Bollman, MD;  Location: [facility] OR;  Service: Open Heart Surgery;  Laterality: N/A;   TONSILLECTOMY   ~ 1950   TRANSCATHETER AORTIC VALVE REPLACEMENT, TRANSFEMORAL N/A 02/18/2016    Procedure: TRANSCATHETER AORTIC VALVE REPLACEMENT, TRANSFEMORAL;  Surgeon: Tonny Bollman, MD;  Location[facility] OR;  Service: Open Heart Surgery;  Laterality: N/A;            Current Outpatient Medications  Medication Sig Dispense Refill   apixaban (ELIQUIS) 5 MG TABS tablet Take 1 tablet (5 mg total) by mouth 2 (two) times daily. 60 tablet 5   Ascorbic Acid (VITAMIN C) 500 MG CAPS Take 500 mg by mouth daily.       atorvastatin (LIPITOR) 80 MG tablet TAKE 1 TABLET BY MOUTH DAILY 90 tablet 3   Cholecalciferol (VITAMIN D-3) 5000 units TABS Take 5,000 Units by mouth daily.        clopidogrel (PLAVIX) 75 MG tablet TAKE ONE TABLET BY MOUTH DAILY 90 tablet 3   finasteride (PROSCAR) 5 MG tablet Take 5 mg by mouth daily.       furosemide (LASIX) 20 MG tablet Take 1 tablet (20 mg total) by mouth daily. Please keep scheduled appointment 90 tablet 3   gabapentin (NEURONTIN) 300 MG capsule Take 300 mg by mouth daily.       isosorbide mononitrate (IMDUR) 120 MG 24 hr tablet TAKE 1 TABLET BY MOUTH DAILY 90 tablet 3   metoprolol succinate (TOPROL-XL) 50 MG 24 hr tablet TAKE ONE TABLET BY MOUTH DAILY WITH OR IMMEDIATELY FOLLOWING A MEAL 90 tablet 3   nitroGLYCERIN (NITROSTAT) 0.4 MG SL tablet DISSOLVE 1 TAB UNDER TONGUE FOR CHEST PAIN - IF PAIN REMAINS AFTER 5 MIN, CALL 911 AND REPEAT DOSE. MAX 3 TABS IN  15 MINUTES 200 tablet 6   oxybutynin (DITROPAN) 5 MG tablet Take 5 mg by mouth daily.        pantoprazole (PROTONIX) 40 MG tablet TAKE ONE TABLET BY MOUTH DAILY 90 tablet 3   ranolazine (RANEXA) 500 MG 12 hr tablet TAKE ONE TABLET BY MOUTH TWICE A DAY 180 tablet 3   spironolactone (ALDACTONE) 25 MG tablet TAKE 1/2 TABLET BY MOUTH DAILY 45 tablet 3   valACYclovir (VALTREX)  500 MG tablet TAKE ONE TABLET BY MOUTH DAILY AS NEEDED FOR OUTBREAK 30 tablet 0   vitamin B-12 (CYANOCOBALAMIN) 500 MCG tablet Take 500 mcg by mouth daily.        No current facility-administered medications for this visit.      Allergies:   Patient has no known allergies.    Social History:  The patient  reports that he quit smoking about 25 years ago. His smoking use included cigarettes. He has a 11.25 pack-year smoking history. He has never used smokeless tobacco. He reports that he does not drink alcohol and does not use drugs.    Family History:  The patient's family history includes Cancer in his sister; Heart attack in his father.   ROS:  Please see the history of present illness.    All other systems are reviewed and otherwise negative.    PHYSICAL EXAM:  VS:  There were no vitals taken for this visit. BMI: There is no height or weight on file to calculate BMI. Well nourished, well developed, in no acute distress HEENT: normocephalic, atraumatic Neck: no JVD, carotid bruits or masses Cardiac:   RRR; no significant murmurs, no rubs, or gallops Lungs:   CTA b/l, no wheezing, rhonchi or rales Abd: soft, nontender MS: no deformity or  atrophy Ext:  no edema Skin: warm and dry, no rash Neuro:  No gross deficits appreciated Psych: euthymic mood, full affect    ICD site is stable, no tethering or discomfort     EKG:  not done today   Device interrogation done today and reviewed by myself:  Battery is near but nt yet to ERI, estimating another month prior to triggering ERI Lead measurements are good No  arrhythmias     Cath 12/26/2020   Ost LM to Dist LM lesion is 100% stenosed.   Ost Cx to Dist Cx lesion is 100% stenosed.   Dist LM to Mid LAD lesion is 100% stenosed.   Ost RCA to Prox RCA lesion is 100% stenosed.   Origin to Prox Graft lesion before 1st Mrg  is 100% stenosed.   Origin to Prox Graft lesion before RPDA  is 95% stenosed.   A drug-eluting stent was successfully placed using a STENT ONYX FRONTIER 4.0X22.   Post intervention, there is a 20% residual stenosis.   LIMA graft was visualized by angiography and is normal in caliber.   SVG graft was visualized by angiography.   SVG graft was not visualized.   The graft exhibits no disease.   LV end diastolic pressure is moderately elevated.   Left main and RCA occlusive disease. The patient is graft dependent. Patent LIMA to the LAD Occluded SVG to OM1 and OM3. This is new since 2018. Some collaterals to OM3 Severe stenosis in sequential SVG to PDA and PL branches of the RCA. Also new since 2018. Moderately elevated LVEDP Successful PCI of the SVG to PDA and PL. Used distal protection and DES x 1. Lesion was quite resistant.    Plan: will observe overnight. Patient was given Brilinta in the lab but I would transition to Plavix in am since he is also on anticoagulation and cost is a major issue. Would give ASA for one month and Plavix for 12 months. May resume Eliquis tomorrow if no bleeding. Will give IV lasix x 1. Need to update Echo- this can be done as outpatient. Will again need to assess CHF therapy. Entresto and SGLT 2 inhibitors have been too expensive.  Could consider aldactone.      Echo 12/27/2020  1. Left ventricular ejection fraction by 2D MOD biplane is 30.6 %. Left  ventricular diastolic parameters are consistent with Grade I diastolic  dysfunction (impaired relaxation).   2. Right ventricular systolic function was not well visualized. There is  normal pulmonary artery systolic pressure.   3. Mild paravalvular  leak is present, mildy worsened from prior echo  06/25/2020. No dehiscence. AV peak velocity 1.4 m/s; peak gradient 8.5  mmHg. Aortic valve regurgitation is trivial. There is a 26 mm Sapien  prosthetic (TAVR) valve present in the aortic   position. Procedure Date: 02/18/2016.    Echo 03/18/21:  IMPRESSIONS  1. Left ventricular ejection fraction, by estimation, is 50 to 55%. The  left ventricle has low normal function. The left ventricle demonstrates  regional wall motion abnormalities (see scoring diagram/findings for  description). The left ventricular  internal cavity size was mildly dilated. There is mild left ventricular  hypertrophy. Left ventricular diastolic parameters are consistent with  Grade III diastolic dysfunction (restrictive). Elevated left ventricular  end-diastolic pressure. There is  severe hypokinesis of the left ventricular, entire inferior wall.   2. Right ventricular systolic function is mildly reduced. The right  ventricular size is mildly enlarged. There is normal pulmonary artery  systolic pressure.   3. Left atrial size was moderately dilated.   4. The mitral valve is grossly normal. Mild to moderate mitral valve  regurgitation.   5. Tricuspid valve regurgitation is moderate.   6. S/p TAVR . There is a trivial perivalvular leak . The aortic valve has  been repaired/replaced. Aortic valve regurgitation is trivial. Procedure  Date: 02/18/2016.    Recent Labs: 01/22/2022: ALT 20; BUN 30; Creatinine, Ser 1.77; Hemoglobin 13.4; Platelets 206; Potassium 4.8; Sodium 140  01/22/2022: Chol/HDL Ratio 3.4; Cholesterol, Total 158; HDL 46; LDL Chol Calc (NIH) 93; Triglycerides 106    CrCl cannot be calculated (Patient's most recent lab result is older than the maximum 21 days allowed.).       Wt Readings from Last 3 Encounters:  01/22/22 184 lb 6.4 oz (83.6 kg)  08/11/21 183 lb (83 kg)  04/09/21 192 lb 4 oz (87.2 kg)      Other studies reviewed: Additional  studies/records reviewed today include: summarized above   ASSESSMENT AND PLAN:   ICD Near ERI Spoke with device clinic RN, she will follow up  on new handheld device to get transmission back on track We discussed generator change procedure today, potential risks/benefits He is agreeable to proceed/scheduled when ERI is reached Will need to hold Plavix and Eliquis 2 days ahead Will put a visit to see him in 6 weeks given home transmitter problem   Paroxysmal Afib CHA2DS2Vasc is 5, on Eliquis, appropriately dosed zero % burden   CAD No nea symptoms C/w Dr. Anola Gurney   ICM Chronic CHF no symptoms or exam findings of volume OL OptiVol looks good C/w Dr. Anola Gurney   VHD S/p TAVR Functioning well, trivial perivalvular leak C/w Dr. Anola Gurney   Secondary hypercoagulable state   Disposition: F/u as above, sooner if needed   Current medicines are reviewed at length with the patient today.  The patient did not have any concerns regarding medicines.   Norma Fredrickson, PA-C 04/19/2022 2:25 PM     [facility] [address] [place] [address] [phone] (office)  [phone] (fax)          EP Attending  Patient seen and examined. Agree with above. The patient has reached ERI and presents for ICD gen change out. I have reviewed the indications/risks/benefits/goals/expectations of ICD gen change out and he wishes to proceed.  Sharlot Gowda Tiawanna Luchsinger,MD

## 2022-06-02 NOTE — Progress Notes (Signed)
Remote ICD transmission.   

## 2022-06-02 NOTE — Discharge Instructions (Signed)

## 2022-06-03 ENCOUNTER — Encounter: Payer: Medicare HMO | Admitting: Physician Assistant

## 2022-06-03 ENCOUNTER — Encounter (HOSPITAL_COMMUNITY): Payer: Self-pay | Admitting: Internal Medicine

## 2022-06-04 ENCOUNTER — Telehealth: Payer: Self-pay | Admitting: Internal Medicine

## 2022-06-04 NOTE — Telephone Encounter (Signed)
I did not need this encounter. °

## 2022-06-18 ENCOUNTER — Ambulatory Visit: Payer: Medicare HMO | Attending: Cardiology

## 2022-06-18 DIAGNOSIS — I255 Ischemic cardiomyopathy: Secondary | ICD-10-CM | POA: Diagnosis not present

## 2022-06-18 LAB — CUP PACEART INCLINIC DEVICE CHECK
Battery Remaining Longevity: 96 mo
Brady Statistic RA Percent Paced: 88.4 %
Brady Statistic RV Percent Paced: 99.1 %
Date Time Interrogation Session: 20240606123941
HighPow Impedance: 57 Ohm
Implantable Lead Connection Status: 753985
Implantable Lead Connection Status: 753985
Implantable Lead Connection Status: 753985
Implantable Lead Implant Date: 20161205
Implantable Lead Implant Date: 20161205
Implantable Lead Implant Date: 20161205
Implantable Lead Location: 753858
Implantable Lead Location: 753859
Implantable Lead Location: 753860
Implantable Lead Model: 4598
Implantable Lead Model: 5076
Implantable Lead Model: 6935
Implantable Pulse Generator Implant Date: 20240521
Lead Channel Impedance Value: 266 Ohm
Lead Channel Impedance Value: 456 Ohm
Lead Channel Impedance Value: 491 Ohm
Lead Channel Pacing Threshold Amplitude: 0.75 V
Lead Channel Pacing Threshold Amplitude: 0.75 V
Lead Channel Pacing Threshold Amplitude: 1.25 V
Lead Channel Pacing Threshold Pulse Width: 0.4 ms
Lead Channel Pacing Threshold Pulse Width: 0.4 ms
Lead Channel Pacing Threshold Pulse Width: 0.4 ms
Lead Channel Sensing Intrinsic Amplitude: 1.3 mV

## 2022-06-18 NOTE — Patient Instructions (Signed)
   After Your ICD (Implantable Cardiac Defibrillator)    Monitor your defibrillator site for redness, swelling, and drainage. Call the device clinic at [phone] if you experience these symptoms or fever/chills.  Your incision was closed with Steri-strips or staples:  You may shower 7 days after your procedure and wash your incision with soap and water. Avoid lotions, ointments, or perfumes over your incision until it is well-healed.  You may use a hot tub or a pool after your wound check appointment if the incision is completely closed.   There are no other restrictions in arm movement after your wound check appointment.  Your ICD is designed to protect you from life threatening heart rhythms. Because of this, you may receive a shock.   1 shock with no symptoms:  Call the office during business hours. 1 shock with symptoms (chest pain, chest pressure, dizziness, lightheadedness, shortness of breath, overall feeling unwell):  Call 911. If you experience 2 or more shocks in 24 hours:  Call 911. If you receive a shock, you should not drive.  [place] DMV - no driving for 6 months if you receive appropriate therapy from your ICD.   ICD Alerts:  Some alerts are vibratory and others beep. These are NOT emergencies. Please call our office to let us know. If this occurs at night or on weekends, it can wait until the next business day. Send a remote transmission.  If your device is capable of reading fluid status (for heart failure), you will be offered monthly monitoring to review this with you.   Remote monitoring is used to monitor your ICD from home. This monitoring is scheduled every 91 days by our office. It allows us to keep an eye on the functioning of your device to ensure it is working properly. You will routinely see your Electrophysiologist annually (more often if necessary).  

## 2022-06-18 NOTE — Progress Notes (Signed)
Wound check appointment. Steri-strips removed. Wound without redness. Incision edges approximated, wound well healed. Small amount of swelling at pocket, minimal, reviewed with Dr. Elberta Fortis, no changes to Eliquis or Plavix.  Patient is to continue to monitor area and knows to call office if any increase in swelling or does not continue to decrease in size over the next few weeks.   Normal device function. BIVP effectively 99.1%.  Thresholds, sensing, and impedances consistent with implant measurements. Device programmed for appropriate chronic lead safety margins. Histogram distribution appropriate for patient and level of activity. No mode switches or ventricular arrhythmias noted. Patient educated about wound care, no arm mobility or lifting restrictions due to gen change only, shock plan reviewed. ROV in 3 months with implanting physician.

## 2022-06-29 DIAGNOSIS — N401 Enlarged prostate with lower urinary tract symptoms: Secondary | ICD-10-CM | POA: Diagnosis not present

## 2022-06-29 DIAGNOSIS — N3941 Urge incontinence: Secondary | ICD-10-CM | POA: Diagnosis not present

## 2022-06-29 DIAGNOSIS — R351 Nocturia: Secondary | ICD-10-CM | POA: Diagnosis not present

## 2022-06-29 DIAGNOSIS — R3912 Poor urinary stream: Secondary | ICD-10-CM | POA: Diagnosis not present

## 2022-07-14 ENCOUNTER — Other Ambulatory Visit: Payer: Self-pay | Admitting: Cardiology

## 2022-07-14 DIAGNOSIS — I480 Paroxysmal atrial fibrillation: Secondary | ICD-10-CM

## 2022-07-14 NOTE — Telephone Encounter (Signed)
Eliquis 5mg  refill request received. Patient is 77 years old, weight-81.6kg, Crea-1.63 on 05/19/22, Diagnosis-Afib, and last seen by Francis Dowse on 04/22/22. Dose is appropriate based on dosing criteria. Will send in refill to requested pharmacy.

## 2022-07-20 DIAGNOSIS — I25119 Atherosclerotic heart disease of native coronary artery with unspecified angina pectoris: Secondary | ICD-10-CM | POA: Diagnosis not present

## 2022-07-20 DIAGNOSIS — E7800 Pure hypercholesterolemia, unspecified: Secondary | ICD-10-CM | POA: Diagnosis not present

## 2022-07-20 DIAGNOSIS — I5022 Chronic systolic (congestive) heart failure: Secondary | ICD-10-CM | POA: Diagnosis not present

## 2022-07-21 LAB — COMPREHENSIVE METABOLIC PANEL
ALT: 15 IU/L (ref 0–44)
AST: 25 IU/L (ref 0–40)
Albumin: 4.3 g/dL (ref 3.8–4.8)
Alkaline Phosphatase: 77 IU/L (ref 44–121)
BUN/Creatinine Ratio: 14 (ref 10–24)
BUN: 25 mg/dL (ref 8–27)
Bilirubin Total: 1.8 mg/dL — ABNORMAL HIGH (ref 0.0–1.2)
CO2: 26 mmol/L (ref 20–29)
Calcium: 9.5 mg/dL (ref 8.6–10.2)
Chloride: 99 mmol/L (ref 96–106)
Creatinine, Ser: 1.75 mg/dL — ABNORMAL HIGH (ref 0.76–1.27)
Globulin, Total: 2.2 g/dL (ref 1.5–4.5)
Glucose: 104 mg/dL — ABNORMAL HIGH (ref 70–99)
Potassium: 4.8 mmol/L (ref 3.5–5.2)
Sodium: 139 mmol/L (ref 134–144)
Total Protein: 6.5 g/dL (ref 6.0–8.5)
eGFR: 39 mL/min/{1.73_m2} — ABNORMAL LOW (ref >59)

## 2022-07-21 LAB — LIPID PANEL
Chol/HDL Ratio: 3.7 ratio (ref 0.0–5.0)
Cholesterol, Total: 146 mg/dL (ref 100–199)
HDL: 40 mg/dL (ref >39)
LDL Chol Calc (NIH): 87 mg/dL (ref 0–99)
Triglycerides: 101 mg/dL (ref 0–149)
VLDL Cholesterol Cal: 19 mg/dL (ref 5–40)

## 2022-07-22 ENCOUNTER — Other Ambulatory Visit: Payer: Self-pay

## 2022-07-22 DIAGNOSIS — E7800 Pure hypercholesterolemia, unspecified: Secondary | ICD-10-CM

## 2022-07-22 DIAGNOSIS — I25119 Atherosclerotic heart disease of native coronary artery with unspecified angina pectoris: Secondary | ICD-10-CM

## 2022-07-22 MED ORDER — EZETIMIBE 10 MG PO TABS: 10.0000 mg | ORAL_TABLET | Freq: Every day | ORAL | 3 refills | Status: DC

## 2022-07-24 DIAGNOSIS — L851 Acquired keratosis [keratoderma] palmaris et plantaris: Secondary | ICD-10-CM | POA: Diagnosis not present

## 2022-07-24 DIAGNOSIS — M2041 Other hammer toe(s) (acquired), right foot: Secondary | ICD-10-CM | POA: Diagnosis not present

## 2022-07-24 DIAGNOSIS — M792 Neuralgia and neuritis, unspecified: Secondary | ICD-10-CM | POA: Diagnosis not present

## 2022-07-24 DIAGNOSIS — G629 Polyneuropathy, unspecified: Secondary | ICD-10-CM | POA: Diagnosis not present

## 2022-07-24 DIAGNOSIS — Q667 Congenital pes cavus, unspecified foot: Secondary | ICD-10-CM | POA: Diagnosis not present

## 2022-08-08 ENCOUNTER — Other Ambulatory Visit: Payer: Self-pay | Admitting: Cardiology

## 2022-08-08 DIAGNOSIS — I5022 Chronic systolic (congestive) heart failure: Secondary | ICD-10-CM

## 2022-09-04 ENCOUNTER — Ambulatory Visit (INDEPENDENT_AMBULATORY_CARE_PROVIDER_SITE_OTHER): Payer: Medicare HMO

## 2022-09-04 DIAGNOSIS — I255 Ischemic cardiomyopathy: Secondary | ICD-10-CM | POA: Diagnosis not present

## 2022-09-04 LAB — CUP PACEART REMOTE DEVICE CHECK
Battery Remaining Longevity: 105 mo
Battery Voltage: 3.03 V
Brady Statistic RV Percent Paced: 98.51 %
Date Time Interrogation Session: 20240823112108
HighPow Impedance: 63 Ohm
Implantable Lead Connection Status: 753985
Implantable Lead Connection Status: 753985
Implantable Lead Connection Status: 753985
Implantable Lead Implant Date: 20161205
Implantable Lead Implant Date: 20161205
Implantable Lead Implant Date: 20161205
Implantable Lead Location: 753858
Implantable Lead Location: 753859
Implantable Lead Location: 753860
Implantable Lead Model: 4598
Implantable Lead Model: 5076
Implantable Lead Model: 6935
Implantable Pulse Generator Implant Date: 20240521
Lead Channel Impedance Value: 266 Ohm
Lead Channel Impedance Value: 342 Ohm
Lead Channel Impedance Value: 342 Ohm
Lead Channel Impedance Value: 361 Ohm
Lead Channel Impedance Value: 361 Ohm
Lead Channel Impedance Value: 418 Ohm
Lead Channel Impedance Value: 456 Ohm
Lead Channel Impedance Value: 475 Ohm
Lead Channel Impedance Value: 551 Ohm
Lead Channel Impedance Value: 570 Ohm
Lead Channel Impedance Value: 570 Ohm
Lead Channel Impedance Value: 646 Ohm
Lead Channel Impedance Value: 665 Ohm
Lead Channel Pacing Threshold Amplitude: 0.625 V
Lead Channel Pacing Threshold Amplitude: 0.625 V
Lead Channel Pacing Threshold Amplitude: 1 V
Lead Channel Pacing Threshold Pulse Width: 0.4 ms
Lead Channel Pacing Threshold Pulse Width: 0.4 ms
Lead Channel Pacing Threshold Pulse Width: 0.4 ms
Lead Channel Sensing Intrinsic Amplitude: 0.9 mV
Lead Channel Setting Pacing Amplitude: 1.5 V
Lead Channel Setting Pacing Amplitude: 1.5 V
Lead Channel Setting Pacing Amplitude: 2 V
Lead Channel Setting Pacing Pulse Width: 0.4 ms
Lead Channel Setting Pacing Pulse Width: 0.4 ms
Lead Channel Setting Sensing Sensitivity: 0.3 mV
Zone Setting Status: 755011
Zone Setting Status: 755011

## 2022-09-09 ENCOUNTER — Encounter: Payer: Self-pay | Admitting: Internal Medicine

## 2022-09-09 ENCOUNTER — Ambulatory Visit: Payer: Medicare HMO | Attending: Internal Medicine | Admitting: Internal Medicine

## 2022-09-09 VITALS — BP 126/70 | HR 60 | Ht 70.0 in | Wt 185.0 lb

## 2022-09-09 DIAGNOSIS — I447 Left bundle-branch block, unspecified: Secondary | ICD-10-CM | POA: Diagnosis not present

## 2022-09-09 DIAGNOSIS — I442 Atrioventricular block, complete: Secondary | ICD-10-CM

## 2022-09-09 DIAGNOSIS — Z95810 Presence of automatic (implantable) cardiac defibrillator: Secondary | ICD-10-CM | POA: Diagnosis not present

## 2022-09-09 LAB — CUP PACEART INCLINIC DEVICE CHECK
Date Time Interrogation Session: 20240828103021
Implantable Lead Connection Status: 753985
Implantable Lead Connection Status: 753985
Implantable Lead Connection Status: 753985
Implantable Lead Implant Date: 20161205
Implantable Lead Implant Date: 20161205
Implantable Lead Implant Date: 20161205
Implantable Lead Location: 753858
Implantable Lead Location: 753859
Implantable Lead Location: 753860
Implantable Lead Model: 4598
Implantable Lead Model: 5076
Implantable Lead Model: 6935
Implantable Pulse Generator Implant Date: 20240521

## 2022-09-09 NOTE — Progress Notes (Signed)
HPI [name] returns today for followup. He is a pleasant 77 yo man with a h/o chronic systolic heart failure, CAD, s/p CABG remotely, s/p TAVR in 2/18, s/p Medtronic biv ICD in 2016. He developed atrial fib and was placed on Eliquis. He denies covid vaccination. He has not been sick.  No Known Allergies   Current Outpatient Medications  Medication Sig Dispense Refill   Ascorbic Acid (VITAMIN C) 500 MG CAPS Take 500 mg by mouth daily.     atorvastatin (LIPITOR) 80 MG tablet TAKE 1 TABLET BY MOUTH DAILY 90 tablet 3   Cholecalciferol (VITAMIN D-3) 5000 units TABS Take 5,000 Units by mouth daily.      clopidogrel (PLAVIX) 75 MG tablet TAKE ONE TABLET BY MOUTH DAILY 90 tablet 3   ELIQUIS 5 MG TABS tablet TAKE 1 TABLET BY MOUTH TWICE A DAY 60 tablet 5   ezetimibe (ZETIA) 10 MG tablet Take 1 tablet (10 mg total) by mouth daily. 90 tablet 3   finasteride (PROSCAR) 5 MG tablet Take 5 mg by mouth daily.     furosemide (LASIX) 20 MG tablet Take 1 tablet (20 mg total) by mouth daily. Please keep scheduled appointment (Patient taking differently: Take 60 mg by mouth daily.) 90 tablet 3   gabapentin (NEURONTIN) 300 MG capsule Take 300 mg by mouth 3 (three) times daily.     isosorbide mononitrate (IMDUR) 120 MG 24 hr tablet TAKE 1 TABLET BY MOUTH DAILY 90 tablet 3   metoprolol succinate (TOPROL-XL) 50 MG 24 hr tablet TAKE ONE TABLET BY MOUTH DAILY WITH OR IMMEDIATELY FOLLOWING A MEAL 90 tablet 3   nitroGLYCERIN (NITROSTAT) 0.4 MG SL tablet DISSOLVE 1 TAB UNDER TONGUE FOR CHEST PAIN - IF PAIN REMAINS AFTER 5 MIN, CALL 911 AND REPEAT DOSE. MAX 3 TABS IN 15 MINUTES 200 tablet 6   oxybutynin (DITROPAN) 5 MG tablet Take 5 mg by mouth daily.      pantoprazole (PROTONIX) 40 MG tablet TAKE 1 TABLET BY MOUTH DAILY 90 tablet 2   ranolazine (RANEXA) 500 MG 12 hr tablet TAKE ONE TABLET BY MOUTH TWICE A DAY 180 tablet 3   spironolactone (ALDACTONE) 25 MG tablet TAKE 1/2 TABLET BY MOUTH DAILY 45 tablet 3    valACYclovir (VALTREX) 500 MG tablet TAKE ONE TABLET BY MOUTH DAILY AS NEEDED FOR OUTBREAK 30 tablet 0   vitamin B-12 (CYANOCOBALAMIN) 500 MCG tablet Take 500 mcg by mouth daily.     No current facility-administered medications for this visit.     Past Medical History:  Diagnosis Date   Aortic stenosis    Barrett's esophageal ulceration    Chronic systolic CHF (congestive heart failure) (HCC)    Coronary artery disease    a. CABG 1996: LIMA graft to the LAD and diagonal, sequential vein graft to the acute marginal, PDA, and posterior lateral branches of the right coronary, sequential saphenous vein graft to the obtuse marginal vessel and distal circumflex. b. Cath 2007 patent grafts. c. s/p DES to SVG-OM2 in 03/2014.   Coronary artery disease involving autologous vein coronary bypass graft with angina pectoris (HCC) 03/13/2014   PCI using DES in SVG to LCx system   Dental caries    pre-heart valve surgery protocol   Gastroesophageal reflux disease    History of erectile dysfunction    History of hiatal hernia    History of kidney stones    History of obesity    History of prostatitis  Hypercholesterolemia    Hypertension    Kidney stones    LBBB (left bundle branch block)    Nodule of kidney 02/04/2016   Incidental 8mm nodule upper pole right kidney noted on CT angiogram - MRI with and without gadolinium contrast recommened in 6 months   OSA (obstructive sleep apnea)    "suppose to wear mask; I don't" (03/13/2014)   PVC's (premature ventricular contractions)    S/P CABG x 7 01/07/1995   LIMA to LAD-diagonal, SVG to OM-LCx, SVG to AM-PD-RPL   S/P TAVR (transcatheter aortic valve replacement) 02/18/2016   26 mm Edwards Sapien 3 transcatheter heart valve placed via percutaneous right transfemoral approach    ROS:   All systems reviewed and negative except as noted in the HPI.   Past Surgical History:  Procedure Laterality Date   CARDIAC CATHETERIZATION  1996; ~ 2006   ;  Ejection fraction is estimated at 45%   CARDIAC CATHETERIZATION N/A 01/22/2016   Procedure: Right/Left Heart Cath and Coronary/Graft Angiography;  Surgeon: [name] Swaziland, MD;  Location: [name] INVASIVE CV LAB;  Service: Cardiovascular;  Laterality: N/A;   CHOLECYSTECTOMY N/A 01/23/2014   Procedure: LAPAROSCOPIC CHOLECYSTECTOMY WITH INTRAOPERATIVE CHOLANGIOGRAM ;  Surgeon: Claud Kelp, MD;  Location: [facility] OR;  Service: General;  Laterality: N/A;   COLONOSCOPY W/ POLYPECTOMY     CORONARY ANGIOPLASTY WITH STENT PLACEMENT  03/13/2014   "1"   CORONARY ARTERY BYPASS GRAFT  1996   "CABG X 7"   CORONARY STENT INTERVENTION N/A 12/26/2020   Procedure: CORONARY STENT INTERVENTION;  Surgeon: Swaziland, [name], MD;  Location: [facility] INVASIVE CV LAB;  Service: Cardiovascular;  Laterality: N/A;   CYSTOSCOPY WITH STENT PLACEMENT     EP IMPLANTABLE DEVICE N/A 12/17/2014   Procedure: BiV ICD Insertion CRT-D;  Surgeon: Marinus Maw, MD;  Location: [facility] INVASIVE CV LAB;  Service: Cardiovascular;  Laterality: N/A;   ICD GENERATOR CHANGEOUT N/A 06/02/2022   Procedure: ICD GENERATOR CHANGEOUT;  Surgeon: Marinus Maw, MD;  Location[facility] INVASIVE CV LAB;  Service: Cardiovascular;  Laterality: N/A;   LEFT HEART CATH AND CORS/GRAFTS ANGIOGRAPHY N/A 12/26/2020   Procedure: LEFT HEART CATH AND CORS/GRAFTS ANGIOGRAPHY;  Surgeon: Swaziland, [name], MD;  Location: [facility] INVASIVE CV LAB;  Service: Cardiovascular;  Laterality: N/A;   LEFT HEART CATHETERIZATION WITH CORONARY/GRAFT ANGIOGRAM N/A 03/13/2014   Procedure: LEFT HEART CATHETERIZATION WITH Isabel Caprice;  Surgeon: [name] Swaziland, MD;  Location: [facility] CATH LAB;  Service: Cardiovascular;  Laterality: N/A;   MULTIPLE EXTRACTIONS WITH ALVEOLOPLASTY N/A 02/07/2016   Procedure: Extraction of tooth #'s 1,2,3,14,15, 18, 20, 29,30,31 with alveoloplasty and dental cleaning of teeth.;  Surgeon: Charlynne Pander, DDS;  Location: [facility] OR;  Service: Oral Surgery;  Laterality: N/A;   TEE WITHOUT  CARDIOVERSION N/A 02/18/2016   Procedure: TRANSESOPHAGEAL ECHOCARDIOGRAM (TEE);  Surgeon: Tonny Bollman, MD;  Location: [facility] OR;  Service: Open Heart Surgery;  Laterality: N/A;   TONSILLECTOMY  ~ 1950   TRANSCATHETER AORTIC VALVE REPLACEMENT, TRANSFEMORAL N/A 02/18/2016   Procedure: TRANSCATHETER AORTIC VALVE REPLACEMENT, TRANSFEMORAL;  Surgeon: Tonny Bollman, MD;  Location: [facility] OR;  Service: Open Heart Surgery;  Laterality: N/A;     Family History  Problem Relation Age of Onset   Heart attack Father    Cancer Sister        Uterine Cancer     Social History   Socioeconomic History   Marital status: Married    Spouse name: Not on file   Number of  children: 3   Years of education: Not on file   Highest education level: Not on file  Occupational History   Occupation: Investment banker, corporate: UNEMPLOYED    Comment: retired  Tobacco Use   Smoking status: Former    Current packs/day: 0.00    Average packs/day: 0.8 packs/day for 15.0 years (11.3 ttl pk-yrs)    Types: Cigarettes    Start date: 01/12/1982    Quit date: 01/12/1997    Years since quitting: 25.6   Smokeless tobacco: Never   Tobacco comments:    3/4 pack per day. smoked 15 years off and on  Vaping Use   Vaping status: Never Used  Substance and Sexual Activity   Alcohol use: No    Alcohol/week: 4.0 standard drinks of alcohol    Types: 4 Cans of beer per week   Drug use: No   Sexual activity: Yes  Other Topics Concern   Not on file  Social History Narrative   Not on file   Social Determinants of Health   Financial Resource Strain: Not on file  Food Insecurity: Not on file  Transportation Needs: Not on file  Physical Activity: Not on file  Stress: Not on file  Social Connections: Not on file  Intimate Partner Violence: Not on file     BP 126/70   Pulse 60   Ht 5\' 10"  (1.778 m)   Wt 185 lb (83.9 kg)   SpO2 97%   BMI 26.54 kg/m   Physical Exam:  Well appearing NAD HEENT: Unremarkable Neck:  No JVD, no  thyromegally Lymphatics:  No adenopathy Back:  No CVA tenderness Lungs:  Clear HEART:  Regular rate rhythm, no murmurs, no rubs, no clicks Abd:  soft, positive bowel sounds, no organomegally, no rebound, no guarding Ext:  2 plus pulses, no edema, no cyanosis, no clubbing Skin:  No rashes no nodules Neuro:  CN II through XII intact, motor grossly intact  EKG  DEVICE  Normal device function.  See PaceArt for details.   Assess/Plan: 1. Chronic systolic heart failure - his symptoms are class 2. He will continue his current meds.  2. PAF - he has not had any more symptoms and he appears to be maintaining NSR. He will continue eliquis. 3. ICD - his medtronic biv ICD is working normally. He is s/p gen change. He has some chronotropic incomepetence and I have turned on rate response. 4. Dyslipidemia - he will continue lipitor. He will maintain a heart healthy diet.   Sharlot Gowda Latresha Yahr,MD

## 2022-09-09 NOTE — Progress Notes (Signed)
Remote ICD transmission.   

## 2022-09-09 NOTE — Patient Instructions (Signed)

## 2022-09-11 ENCOUNTER — Telehealth: Payer: Self-pay | Admitting: Internal Medicine

## 2022-09-11 NOTE — Telephone Encounter (Signed)
Spoke with Pt.  He complains of a dull pain above his device site since device reprogrammed DDDR from DDD.  Requested transmission.  Transmission received.    Histograms have improved.  However, at times Pt heart rate is paced above 100 bpm.  With history of CAD wonder if heart rates this fast is causing some discomfort.  Will have Pt come in to device clinic to turn down rate response to see if this improves symptoms.  Pt agreeable.

## 2022-09-11 NOTE — Telephone Encounter (Signed)
Pt c/o of Chest Pain: STAT if CP now or developed within 24 hours  1. Are you having CP right now? yes  2. Are you experiencing any other symptoms (ex. SOB, nausea, vomiting, sweating)? sob  3. How long have you been experiencing CP? Yes   4. Is your CP continuous or coming and going? Coming and going   5. Have you taken Nitroglycerin? Yes  ?

## 2022-09-11 NOTE — Telephone Encounter (Signed)
Received call transferred directly from operator and spoke with patient.  He reports his pacemaker was adjusted yesterday and last evening he started having a dull pain in his chest and shortness of breath with exertion. Pain does not radiate. Is the same at rest and with exertion. Not like pain he had prior to his 2 by pass surgeries.  He was eventually able to sleep some last night but this AM continues to have the dull pain and shortness of breath with exertion. Will forward to device clinic to contact patient.  Patient advised if his symptoms worsen he should go to ED for evaluation.

## 2022-09-11 NOTE — Telephone Encounter (Signed)
Pt seen in device clinic.  Following programming changes made:  Changed activity threshold to medium low.  Decreased ADL rate to 90 bpm.  Decreased upper sensor rate to 110 bpm.  Pt will call next week if further adjustments needed.

## 2022-09-15 ENCOUNTER — Telehealth: Payer: Self-pay | Admitting: Internal Medicine

## 2022-09-15 NOTE — Telephone Encounter (Signed)
Pt was calling in to have triage deliver a message to Ancil Boozer RN in device.  Pt was simply calling in to let Boneta Lucks RN know that the program changes she made on his device last week worked perfectly.  Pt states he wanted to let Boneta Lucks RN know that she is a Youth worker" and he chest hasn't felt this good in well over 20 years.   Pt states he is back to his baseline activities and enjoying working outside because of [place].   He wanted me to deliver her this message and tell her "thank you for all she did."  Pt is aware that I will deliver this thoughtful message to [employer].   Pt verbalized understanding and agrees with this plan.

## 2022-09-15 NOTE — Telephone Encounter (Signed)
Patient is requesting to speak with Boneta Lucks. Patient declined discussing with me. He states he just needs to speak with Boneta Lucks.

## 2022-09-17 DIAGNOSIS — L570 Actinic keratosis: Secondary | ICD-10-CM | POA: Diagnosis not present

## 2022-09-17 DIAGNOSIS — X32XXXD Exposure to sunlight, subsequent encounter: Secondary | ICD-10-CM | POA: Diagnosis not present

## 2022-09-17 DIAGNOSIS — B078 Other viral warts: Secondary | ICD-10-CM | POA: Diagnosis not present

## 2022-09-22 ENCOUNTER — Other Ambulatory Visit: Payer: Self-pay | Admitting: Internal Medicine

## 2022-09-22 DIAGNOSIS — I350 Nonrheumatic aortic (valve) stenosis: Secondary | ICD-10-CM

## 2022-10-23 DIAGNOSIS — E7800 Pure hypercholesterolemia, unspecified: Secondary | ICD-10-CM | POA: Diagnosis not present

## 2022-10-23 DIAGNOSIS — M2041 Other hammer toe(s) (acquired), right foot: Secondary | ICD-10-CM | POA: Diagnosis not present

## 2022-10-23 DIAGNOSIS — Q667 Congenital pes cavus, unspecified foot: Secondary | ICD-10-CM | POA: Diagnosis not present

## 2022-10-23 DIAGNOSIS — M792 Neuralgia and neuritis, unspecified: Secondary | ICD-10-CM | POA: Diagnosis not present

## 2022-10-23 DIAGNOSIS — G629 Polyneuropathy, unspecified: Secondary | ICD-10-CM | POA: Diagnosis not present

## 2022-10-23 DIAGNOSIS — L851 Acquired keratosis [keratoderma] palmaris et plantaris: Secondary | ICD-10-CM | POA: Diagnosis not present

## 2022-10-23 DIAGNOSIS — I25119 Atherosclerotic heart disease of native coronary artery with unspecified angina pectoris: Secondary | ICD-10-CM | POA: Diagnosis not present

## 2022-10-23 LAB — HEPATIC FUNCTION PANEL
ALT: 24 [IU]/L (ref 0–44)
AST: 34 [IU]/L (ref 0–40)
Albumin: 4.3 g/dL (ref 3.8–4.8)
Alkaline Phosphatase: 65 [IU]/L (ref 44–121)
Bilirubin Total: 2 mg/dL — ABNORMAL HIGH (ref 0.0–1.2)
Bilirubin, Direct: 0.44 mg/dL — ABNORMAL HIGH (ref 0.00–0.40)
Total Protein: 6.8 g/dL (ref 6.0–8.5)

## 2022-10-23 LAB — LIPID PANEL
Chol/HDL Ratio: 3.5 {ratio} (ref 0.0–5.0)
Cholesterol, Total: 152 mg/dL (ref 100–199)
HDL: 44 mg/dL (ref >39)
LDL Chol Calc (NIH): 88 mg/dL (ref 0–99)
Triglycerides: 112 mg/dL (ref 0–149)
VLDL Cholesterol Cal: 20 mg/dL (ref 5–40)

## 2022-11-17 ENCOUNTER — Other Ambulatory Visit: Payer: Self-pay

## 2022-11-17 MED ORDER — SPIRONOLACTONE 25 MG PO TABS: 12.5000 mg | ORAL_TABLET | Freq: Every day | ORAL | 0 refills | Status: DC

## 2022-11-30 ENCOUNTER — Other Ambulatory Visit: Payer: Self-pay

## 2022-11-30 MED ORDER — CLOPIDOGREL BISULFATE 75 MG PO TABS: 75.0000 mg | ORAL_TABLET | Freq: Every day | ORAL | 2 refills | Status: DC

## 2022-12-04 ENCOUNTER — Ambulatory Visit: Payer: Medicare HMO

## 2022-12-04 DIAGNOSIS — I255 Ischemic cardiomyopathy: Secondary | ICD-10-CM | POA: Diagnosis not present

## 2022-12-04 LAB — CUP PACEART REMOTE DEVICE CHECK
Battery Remaining Longevity: 98 mo
Battery Voltage: 2.99 V
Brady Statistic AP VP Percent: 93.66 %
Brady Statistic AP VS Percent: 0.09 %
Brady Statistic AS VP Percent: 5.34 %
Brady Statistic AS VS Percent: 0.92 %
Brady Statistic RA Percent Paced: 94.26 %
Brady Statistic RV Percent Paced: 98.99 %
Date Time Interrogation Session: 20241121225012
HighPow Impedance: 63 Ohm
Implantable Lead Connection Status: 753985
Implantable Lead Connection Status: 753985
Implantable Lead Connection Status: 753985
Implantable Lead Implant Date: 20161205
Implantable Lead Implant Date: 20161205
Implantable Lead Implant Date: 20161205
Implantable Lead Location: 753858
Implantable Lead Location: 753859
Implantable Lead Location: 753860
Implantable Lead Model: 4598
Implantable Lead Model: 5076
Implantable Lead Model: 6935
Implantable Pulse Generator Implant Date: 20240521
Lead Channel Impedance Value: 266 Ohm
Lead Channel Impedance Value: 323 Ohm
Lead Channel Impedance Value: 342 Ohm
Lead Channel Impedance Value: 361 Ohm
Lead Channel Impedance Value: 380 Ohm
Lead Channel Impedance Value: 380 Ohm
Lead Channel Impedance Value: 456 Ohm
Lead Channel Impedance Value: 475 Ohm
Lead Channel Impedance Value: 551 Ohm
Lead Channel Impedance Value: 570 Ohm
Lead Channel Impedance Value: 608 Ohm
Lead Channel Impedance Value: 646 Ohm
Lead Channel Impedance Value: 665 Ohm
Lead Channel Pacing Threshold Amplitude: 0.625 V
Lead Channel Pacing Threshold Amplitude: 0.75 V
Lead Channel Pacing Threshold Amplitude: 1.125 V
Lead Channel Pacing Threshold Pulse Width: 0.4 ms
Lead Channel Pacing Threshold Pulse Width: 0.4 ms
Lead Channel Pacing Threshold Pulse Width: 0.4 ms
Lead Channel Sensing Intrinsic Amplitude: 0.9 mV
Lead Channel Setting Pacing Amplitude: 1.5 V
Lead Channel Setting Pacing Amplitude: 1.75 V
Lead Channel Setting Pacing Amplitude: 2 V
Lead Channel Setting Pacing Pulse Width: 0.4 ms
Lead Channel Setting Pacing Pulse Width: 0.4 ms
Lead Channel Setting Sensing Sensitivity: 0.3 mV
Zone Setting Status: 755011
Zone Setting Status: 755011

## 2022-12-08 ENCOUNTER — Other Ambulatory Visit: Payer: Self-pay | Admitting: Cardiology

## 2022-12-24 NOTE — Progress Notes (Signed)
Remote ICD transmission.   

## 2023-01-07 ENCOUNTER — Telehealth: Payer: Self-pay

## 2023-01-07 ENCOUNTER — Telehealth: Payer: Self-pay | Admitting: Cardiology

## 2023-01-07 ENCOUNTER — Other Ambulatory Visit: Payer: Self-pay | Admitting: Cardiology

## 2023-01-07 NOTE — Telephone Encounter (Signed)
Advisement received from Dr. Swaziland.  Outreach made to Pt.  Upon further questioning-Pt is only taking furosemide 20 mg one tablet by mouth daily.  Will have Pt increase to 60 mg by mouth ONCE daily for 4 days.  Advised Pt to watch salt intake.  He will continue to monitor SOB and if no improvement he will call this nurse back.  Pt thanked nurse for follow up.

## 2023-01-07 NOTE — Telephone Encounter (Signed)
Call in received from Pt.  Per Pt he states that he has had increased SOB off and on over the last few weeks.  Transmission received.  Device function WNL.  Optivol has been increasing over the last 2 weeks.  Pt states he takes furosemide 60 mg by mouth daily.  Advised would forward to primary cardiologist Dr. Swaziland for further medication advisement.

## 2023-01-07 NOTE — Telephone Encounter (Signed)
The pt states he been experiencing SOB off and on for 2-3 weeks. It comes in waves. He will be fine and all of a sudden he will be grasping for breaths. Transmission received. I let him speak with Boneta Lucks, rn.

## 2023-01-07 NOTE — Telephone Encounter (Signed)
Patient called to check pacemaker device.

## 2023-01-13 ENCOUNTER — Other Ambulatory Visit: Payer: Self-pay | Admitting: Cardiology

## 2023-01-13 DIAGNOSIS — I25708 Atherosclerosis of coronary artery bypass graft(s), unspecified, with other forms of angina pectoris: Secondary | ICD-10-CM

## 2023-01-14 ENCOUNTER — Telehealth: Payer: Self-pay | Admitting: Cardiology

## 2023-01-14 NOTE — Telephone Encounter (Signed)
 Called patient with no answer. Left message to return call.

## 2023-01-14 NOTE — Telephone Encounter (Signed)
 Pt called in asking to speak with nurse about his medications and some SOB (not currently)

## 2023-01-15 NOTE — Telephone Encounter (Signed)
 Patient identification verified by 2 forms. Bertina Cooks, RN    Called and spoke to patient  Patient states:   -he is having a lot of SOB   -symptoms started last week   -spoke to office last week, increased lasix  for 4 days, symptoms did not improve   -symptoms have remained the same   -SOB is not all the time, comes and goes   -can be sitting, and becomes out of breath for 30second-1 minute   -having issues with NTG prescription  -would like office to call pharmacy and notify quantity of 200tabs is okay   -does not use NTG daily, sometimes uses with activity, like to have 200tabs just incase  Patient denies:   -chest pain   -heart palpitation   -swelling  Patient scheduled for OV with DOD Dr. Pietro on 1/6 at 2:20pm  Reviewed ED warning signs/precautions  Patient verbalized understanding, no questions at this time

## 2023-01-18 ENCOUNTER — Encounter: Payer: Self-pay | Admitting: Cardiology

## 2023-01-18 ENCOUNTER — Ambulatory Visit: Payer: Medicare HMO | Attending: Cardiology | Admitting: Cardiology

## 2023-01-18 VITALS — BP 118/60 | HR 74 | Ht 70.0 in | Wt 176.0 lb

## 2023-01-18 DIAGNOSIS — I2510 Atherosclerotic heart disease of native coronary artery without angina pectoris: Secondary | ICD-10-CM | POA: Diagnosis not present

## 2023-01-18 DIAGNOSIS — E7800 Pure hypercholesterolemia, unspecified: Secondary | ICD-10-CM | POA: Diagnosis not present

## 2023-01-18 DIAGNOSIS — Z952 Presence of prosthetic heart valve: Secondary | ICD-10-CM | POA: Diagnosis not present

## 2023-01-18 DIAGNOSIS — R0602 Shortness of breath: Secondary | ICD-10-CM

## 2023-01-18 DIAGNOSIS — I25119 Atherosclerotic heart disease of native coronary artery with unspecified angina pectoris: Secondary | ICD-10-CM | POA: Diagnosis not present

## 2023-01-18 DIAGNOSIS — I255 Ischemic cardiomyopathy: Secondary | ICD-10-CM

## 2023-01-18 MED ORDER — NITROGLYCERIN 0.4 MG SL SUBL: 0.4000 mg | SUBLINGUAL_TABLET | SUBLINGUAL | 0 refills | Status: DC | PRN

## 2023-01-18 NOTE — Patient Instructions (Signed)
 Testing/Procedures:  Your physician has requested that you have an echocardiogram. Echocardiography is a painless test that uses sound waves to create images of your heart. It provides your doctor with information about the size and shape of your heart and how well your heart's chambers and valves are working. This procedure takes approximately one hour. There are no restrictions for this procedure. Please do NOT wear cologne, perfume, aftershave, or lotions (deodorant is allowed). Please arrive 15 minutes prior to your appointment time.  Please note: We ask at that you not bring children with you during ultrasound (echo/ vascular) testing. Due to room size and safety concerns, children are not allowed in the ultrasound rooms during exams. Our front office staff cannot provide observation of children in our lobby area while testing is being conducted. An adult accompanying a patient to their appointment will only be allowed in the ultrasound room at the discretion of the ultrasound technician under special circumstances. We apologize for any inconvenience. [address]     Please report to Radiology at the [facility] Main Entrance 30 minutes early for your test.  [address] [place], [place] [address]        How to Prepare for Your Cardiac PET/CT Stress Test:  Nothing to eat or drink, except water, 3 hours prior to arrival time.  NO caffeine/decaffeinated products, or chocolate 12 hours prior to arrival. (Please note decaffeinated beverages (teas/coffees) still contain caffeine).  If you have caffeine within 12 hours prior, the test will need to be rescheduled.  Medication instructions: Do not take erectile dysfunction medications for 72 hours prior to test (sildenafil, tadalafil) Do not take nitrates -isosorbide  mononitrate the day before or day of test  NO perfume, cologne or lotion on chest or abdomen area.   Total time is 1 to 2 hours; you may want to  bring reading material for the waiting time.    In preparation for your appointment, medication and supplies will be purchased.  Appointment availability is limited, so if you need to cancel or reschedule, please call the Radiology Department at [phone] [address]) OR [phone] [facility]) 24 hours in advance to avoid a cancellation fee of $100.00  What to Expect When you Arrive:  Once you arrive and check in for your appointment, you will be taken to a preparation room within the Radiology Department.  A technologist or Nurse will obtain your medical history, verify that you are correctly prepped for the exam, and explain the procedure.  Afterwards, an IV will be started in your arm and electrodes will be placed on your skin for EKG monitoring during the stress portion of the exam. Then you will be escorted to the PET/CT scanner.  There, staff will get you positioned on the scanner and obtain a blood pressure and EKG.  During the exam, you will continue to be connected to the EKG and blood pressure machines.  A small, safe amount of a radioactive tracer will be injected in your IV to obtain a series of pictures of your heart along with an injection of a stress agent.    After your Exam:  It is recommended that you eat a meal and drink a caffeinated beverage to counter act any effects of the stress agent.  Drink plenty of fluids for the remainder of the day and urinate frequently for the first couple of hours after the exam.  Your doctor will inform you of your test results within 7-10  business days.  For more information and frequently asked questions, please visit our website: [email or web address]  For questions about your test or how to prepare for your test, please call: Cardiac Imaging Nurse Navigators Office: [phone]    Follow-Up: At [facility], you and your health needs are our priority.  As part of our continuing mission to provide you with  exceptional heart care, we have created designated Provider Care Teams.  These Care Teams include your primary Cardiologist (physician) and Advanced Practice Providers (APPs -  Physician Assistants and Nurse Practitioners) who all work together to provide you with the care you need, when you need it.  We recommend signing up for the patient portal called MyChart.  Sign up information is provided on this After Visit Summary.  MyChart is used to connect with patients for Virtual Visits (Telemedicine).  Patients are able to view lab/test results, encounter notes, upcoming appointments, etc.  Non-urgent messages can be sent to your provider as well.   To learn more about what you can do with MyChart, go to [email or web address].    Your next appointment:   6 week(s)  Provider:   Peter Jordan, MD

## 2023-01-18 NOTE — Progress Notes (Signed)
 HPI: Follow-up coronary artery disease, ischemic cardiomyopathy, chronic systolic congestive heart failure, status post TAVR, paroxysmal atrial fibrillation, hypertension, hyperlipidemia.  Patient is followed by Dr. Jordan.  Patient is status post coronary artery bypass and graft in 1996.  He had PCI of the saphenous vein graft to OM 2 and 2016.  He is status post TAVR 2018.  Most recent cardiac catheterization December 2022 showed severe three-vessel coronary artery disease, occluded saphenous vein graft to OM1 and OM 3, 95% lesion in sequential saphenous vein graft to PDA and posterior lateral, patent LIMA to the LAD.  Patient had PCI of the vein graft to the PDA/posterolateral at that time.  Most recent echocardiogram March 2023 showed normal LV function, mild left ventricular enlargement, mild left ventricular hypertrophy, grade 3 diastolic dysfunction, mild right ventricular enlargement, moderate left atrial enlargement, mild to moderate mitral regurgitation, moderate tricuspid regurgitation, status post TAVR with trace aortic insufficiency and mean gradient 7 mmHg.  Also with previous biv ICD.  Previously considered for Entresto  and Jardiance  but not started due to expense.  Had syncopal episode with losartan .  Apparently has had difficulties with chronic chest pain and dyspnea.  Contacted the office with increased dyspnea and added to my schedule today.  Patient has chronic dyspnea on exertion but typically does not have significant orthopnea or PND.  He also has chronic mild pedal edema.  He has intermittent chest pain that he states is unchanged and he takes nitroglycerin  for.  Recently though he has noticed episodes of sudden dyspnea.  He will take 10 to 12 breaths with resolution.  It is not related to activities.  Note he has not traveled recently with no recent surgeries.  Current Outpatient Medications  Medication Sig Dispense Refill   Ascorbic Acid  (VITAMIN C ) 500 MG CAPS Take 500 mg by  mouth daily.     atorvastatin  (LIPITOR ) 80 MG tablet TAKE 1 TABLET BY MOUTH DAILY 90 tablet 3   Cholecalciferol  (VITAMIN D -3) 5000 units TABS Take 5,000 Units by mouth daily.      clopidogrel  (PLAVIX ) 75 MG tablet Take 1 tablet (75 mg total) by mouth daily. 90 tablet 2   ELIQUIS  5 MG TABS tablet TAKE 1 TABLET BY MOUTH TWICE A DAY 60 tablet 5   finasteride  (PROSCAR ) 5 MG tablet Take 5 mg by mouth daily.     furosemide  (LASIX ) 20 MG tablet TAKE 1 TABLET BY MOUTH DAILY 90 tablet 0   gabapentin  (NEURONTIN ) 300 MG capsule Take 300 mg by mouth 3 (three) times daily.     isosorbide  mononitrate (IMDUR ) 120 MG 24 hr tablet TAKE 1 TABLET BY MOUTH DAILY 90 tablet 3   metoprolol  succinate (TOPROL -XL) 50 MG 24 hr tablet TAKE 1 TABLET BY MOUTH DAILY WITH OR IMEDIATELY FOLLOWING A MEAL 90 tablet 3   oxybutynin  (DITROPAN ) 5 MG tablet Take 5 mg by mouth daily.      pantoprazole  (PROTONIX ) 40 MG tablet TAKE 1 TABLET BY MOUTH DAILY 90 tablet 2   ranolazine  (RANEXA ) 500 MG 12 hr tablet TAKE 1 TABLET BY MOUTH TWICE A DAY 180 tablet 3   spironolactone  (ALDACTONE ) 25 MG tablet Take 0.5 tablets (12.5 mg total) by mouth daily. 45 tablet 0   valACYclovir  (VALTREX ) 500 MG tablet TAKE ONE TABLET BY MOUTH DAILY AS NEEDED FOR OUTBREAK 30 tablet 0   vitamin B-12 (CYANOCOBALAMIN) 500 MCG tablet Take 500 mcg by mouth daily.     ezetimibe  (ZETIA ) 10 MG tablet Take 1 tablet (  10 mg total) by mouth daily. 90 tablet 3   nitroGLYCERIN  (NITROSTAT ) 0.4 MG SL tablet Place 1 tablet (0.4 mg total) under the tongue every 5 (five) minutes as needed. DISSOLVE 1 TAB UNDER TONGUE FOR CHEST PAIN - IF PAIN REMAINS AFTER 5 MIN, CALL 911 AND REPEAT DOSE. MAX 3 TABS IN 15 MINUTES. 200 tablet 0   No current facility-administered medications for this visit.     Past Medical History:  Diagnosis Date   Aortic stenosis    Barrett's esophageal ulceration    Chronic systolic CHF (congestive heart failure) (HCC)    Coronary artery disease    a.  CABG 1996: LIMA graft to the LAD and diagonal, sequential vein graft to the acute marginal, PDA, and posterior lateral branches of the right coronary, sequential saphenous vein graft to the obtuse marginal vessel and distal circumflex. b. Cath 2007 patent grafts. c. s/p DES to SVG-OM2 in 03/2014.   Coronary artery disease involving autologous vein coronary bypass graft with angina pectoris (HCC) 03/13/2014   PCI using DES in SVG to LCx system   Dental caries    pre-heart valve surgery protocol   Gastroesophageal reflux disease    History of erectile dysfunction    History of hiatal hernia    History of kidney stones    History of obesity    History of prostatitis    Hypercholesterolemia    Hypertension    Kidney stones    LBBB (left bundle branch block)    Nodule of kidney 02/04/2016   Incidental 8mm nodule upper pole right kidney noted on CT angiogram - MRI with and without gadolinium contrast recommened in 6 months   OSA (obstructive sleep apnea)    suppose to wear mask; I don't (03/13/2014)   PVC's (premature ventricular contractions)    S/P CABG x 7 01/07/1995   LIMA to LAD-diagonal, SVG to OM-LCx, SVG to AM-PD-RPL   S/P TAVR (transcatheter aortic valve replacement) 02/18/2016   26 mm Edwards Sapien 3 transcatheter heart valve placed via percutaneous right transfemoral approach    Past Surgical History:  Procedure Laterality Date   CARDIAC CATHETERIZATION  1996; ~ 2006   ; Ejection fraction is estimated at 45%   CARDIAC CATHETERIZATION N/A 01/22/2016   Procedure: Right/Left Heart Cath and Coronary/Graft Angiography;  Surgeon: [name] Jordan, MD;  Location: [facility] INVASIVE CV LAB;  Service: Cardiovascular;  Laterality: N/A;   CHOLECYSTECTOMY N/A 01/23/2014   Procedure: LAPAROSCOPIC CHOLECYSTECTOMY WITH INTRAOPERATIVE CHOLANGIOGRAM ;  Surgeon: Elon Pacini, MD;  Location: [facility] OR;  Service: General;  Laterality: N/A;   COLONOSCOPY W/ POLYPECTOMY     CORONARY ANGIOPLASTY WITH STENT  PLACEMENT  03/13/2014   1   CORONARY ARTERY BYPASS GRAFT  1996   CABG X 7   CORONARY STENT INTERVENTION N/A 12/26/2020   Procedure: CORONARY STENT INTERVENTION;  Surgeon: Jordan, [name], MD;  Location: [facility] INVASIVE CV LAB;  Service: Cardiovascular;  Laterality: N/A;   CYSTOSCOPY WITH STENT PLACEMENT     EP IMPLANTABLE DEVICE N/A 12/17/2014   Procedure: BiV ICD Insertion CRT-D;  Surgeon: Danelle LELON Birmingham, MD;  Location: [facility] INVASIVE CV LAB;  Service: Cardiovascular;  Laterality: N/A;   ICD GENERATOR CHANGEOUT N/A 06/02/2022   Procedure: ICD GENERATOR CHANGEOUT;  Surgeon: Birmingham Danelle LELON, MD;  Location: [facility] INVASIVE CV LAB;  Service: Cardiovascular;  Laterality: N/A;   LEFT HEART CATH AND CORS/GRAFTS ANGIOGRAPHY N/A 12/26/2020   Procedure: LEFT HEART CATH AND CORS/GRAFTS ANGIOGRAPHY;  Surgeon: Jordan, Peter  M, MD;  Location: [facility] INVASIVE CV LAB;  Service: Cardiovascular;  Laterality: N/A;   LEFT HEART CATHETERIZATION WITH CORONARY/GRAFT ANGIOGRAM N/A 03/13/2014   Procedure: LEFT HEART CATHETERIZATION WITH EL BILE;  Surgeon: [name] Jordan, MD;  Location: [facility] CATH LAB;  Service: Cardiovascular;  Laterality: N/A;   MULTIPLE EXTRACTIONS WITH ALVEOLOPLASTY N/A 02/07/2016   Procedure: Extraction of tooth #'s 1,2,3,14,15, 18, 20, 29,30,31 with alveoloplasty and dental cleaning of teeth.;  Surgeon: Tanda JULIANNA Fanny, DDS;  Location: [facility] OR;  Service: Oral Surgery;  Laterality: N/A;   TEE WITHOUT CARDIOVERSION N/A 02/18/2016   Procedure: TRANSESOPHAGEAL ECHOCARDIOGRAM (TEE);  Surgeon: Ozell Fell, MD;  Location: [facility] OR;  Service: Open Heart Surgery;  Laterality: N/A;   TONSILLECTOMY  ~ 1950   TRANSCATHETER AORTIC VALVE REPLACEMENT, TRANSFEMORAL N/A 02/18/2016   Procedure: TRANSCATHETER AORTIC VALVE REPLACEMENT, TRANSFEMORAL;  Surgeon: Ozell Fell, MD;  Location: [facility] OR;  Service: Open Heart Surgery;  Laterality: N/A;    Social History   Socioeconomic History   Marital status: Married     Spouse name: Not on file   Number of children: 3   Years of education: Not on file   Highest education level: Not on file  Occupational History   Occupation: Investment Banker, Corporate: UNEMPLOYED    Comment: retired  Tobacco Use   Smoking status: Former    Current packs/day: 0.00    Average packs/day: 0.8 packs/day for 15.0 years (11.3 ttl pk-yrs)    Types: Cigarettes    Start date: 01/12/1982    Quit date: 01/12/1997    Years since quitting: 26.0   Smokeless tobacco: Never   Tobacco comments:    3/4 pack per day. smoked 15 years off and on  Vaping Use   Vaping status: Never Used  Substance and Sexual Activity   Alcohol use: No    Alcohol/week: 4.0 standard drinks of alcohol    Types: 4 Cans of beer per week   Drug use: No   Sexual activity: Yes  Other Topics Concern   Not on file  Social History Narrative   Not on file   Social Drivers of Health   Financial Resource Strain: Not on file  Food Insecurity: Not on file  Transportation Needs: Not on file  Physical Activity: Not on file  Stress: Not on file  Social Connections: Not on file  Intimate Partner Violence: Not on file    Family History  Problem Relation Age of Onset   Heart attack Father    Cancer Sister        Uterine Cancer    ROS: no fevers or chills, productive cough, hemoptysis, dysphasia, odynophagia, melena, hematochezia, dysuria, hematuria, rash, seizure activity, orthopnea, PND, claudication. Remaining systems are negative.  Physical Exam: Well-developed well-nourished in no acute distress.  Skin is warm and dry.  HEENT is normal.  Neck is supple.  Chest is clear to auscultation with normal expansion.  Cardiovascular exam is regular rate and rhythm.  Abdominal exam nontender or distended. No masses palpated. Extremities show trace to 1+ edema. neuro grossly intact  EKG Interpretation Date/Time:  Monday January 18 2023 14:12:11 EST Ventricular Rate:  74 PR Interval:  146 QRS Duration:  210 QT  Interval:  516 QTC Calculation: 572 R Axis:   -86  Text Interpretation: AV dual-paced rhythm with occasional ventricular-paced complexes When compared with ECG of 09-Sep-2022 10:07, Vent. rate has increased BY  14 BPM Confirmed by Pietro Rogue ([id number]) on 01/18/2023 2:28:52 PM  A/P  1 chronic systolic congestive heart failure/ischemic cardiomyopathy-patient could not afford Entresto  or SGLT2 inhibitor previously.  He had a syncopal episode on losartan .  Will continue present dose of Toprol  and Aldactone .  Not significantly volume overloaded on examination.  2 paroxysmal atrial fibrillation-patient remains in AV paced rhythm.  Will continue apixaban .  3 coronary artery disease status post coronary bypass and graft-patient was concerned about the potential for recurrent coronary disease.  He does take nitroglycerin  but this is unchanged and he is not having chest pain different than previous office visit.  Will arrange stress PET to screen for ischemia given complaints of dyspnea.  Will also arrange an echocardiogram to better assess LV function.  4 history of biv ICD-managed by electrophysiology.  5 history of TAVR-we will plan to repeat echocardiogram.  6 hyperlipidemia-continue atorvastatin .  Redell Shallow, MD

## 2023-01-21 ENCOUNTER — Ambulatory Visit: Payer: Medicare HMO | Admitting: Podiatry

## 2023-01-21 ENCOUNTER — Encounter: Payer: Self-pay | Admitting: Podiatry

## 2023-01-21 VITALS — Ht 70.0 in | Wt 165.0 lb

## 2023-01-21 DIAGNOSIS — G6289 Other specified polyneuropathies: Secondary | ICD-10-CM | POA: Diagnosis not present

## 2023-01-21 MED ORDER — GABAPENTIN 300 MG PO CAPS: ORAL_CAPSULE | ORAL | 0 refills | Status: DC

## 2023-01-21 NOTE — Patient Instructions (Signed)
 VISIT SUMMARY:  During today's visit, we discussed the discomfort you are experiencing in your feet, particularly related to hammer toes, peripheral neuropathy, and venous insufficiency. We reviewed your current symptoms, medications, and potential treatment options.  YOUR PLAN:  -PERIPHERAL NEUROPATHY: Peripheral neuropathy is a condition that results from damage to the nerves outside of the brain and spinal cord, often causing weakness, numbness, and pain, usually in the hands and feet. We will increase your Gabapentin  dosage to 300mg  in the morning, 300mg  in the afternoon, and 600mg  at night, gradually increasing the dose over the next few months as tolerated. We also recommend checking your B12 levels with your primary care doctor, as B12 deficiency can worsen neuropathy symptoms.  -HAMMER TOES: Hammer toes are a deformity that causes your toes to bend or curl downward instead of pointing forward. You are currently using pads to relieve the pain at the tip of your third toe, which is effective. We will continue with this approach and consider surgical intervention in the future if your symptoms worsen.  -VENOUS INSUFFICIENCY: Venous insufficiency occurs when the veins in your legs have trouble sending blood back to your heart, leading to swelling and skin changes. We recommend using compression stockings, continuing to walk for exercise, and elevating your legs at the end of the day to help manage the swelling.  INSTRUCTIONS:  Please follow up in 6 months or sooner if your symptoms worsen. Contact us  in 3 months for a medication refill or dose adjustment. Additionally, make sure to check your B12 levels with your primary care doctor.

## 2023-01-21 NOTE — Progress Notes (Signed)
 Subjective:  Patient ID: [name], male    DOB: 16-Feb-1945,  MRN: [id number]  Chief Complaint  Patient presents with   Nail Problem    He reports that he is here to establish, he reports that has a history of neuropathy and would like a refill of his Gabapentin     Discussed the use of AI scribe software for clinical note transcription with the patient, who gave verbal consent to proceed.  History of Present Illness   The patient presents with complaints of discomfort in his feet, specifically related to hammer toes. He reports that the discomfort is alleviated when using a pad on the bottom of his feet. He also notes an irritating spot on the side of his foot that has been present for approximately two to three weeks. Despite applying Vaseline, the patient reports no significant improvement. He denies any history of wounds in this area and also denies having diabetes.  The patient also reports experiencing symptoms of myopathy, describing it as feeling like he is walking on rocks. He has been taking a medication prescribed by a previous doctor, which he has been taking three times a day for about a year. Despite this, he reports that the medication does not seem to be working effectively.  The patient also reports swelling in both feet, which he notes comes and goes. He does not currently wear any compression stockings. He also mentions having heart problems, but does not elaborate further on this.  Regarding his hammer toes, the patient reports pain at the tip of the third toe. He has been using pads, which he finds to be very helpful in managing the discomfort. He has been informed about the surgical option to correct the hammer toes but does not express immediate interest in pursuing this.  The patient also reports taking oral B12 supplements, but he has not had his B12 or folate levels checked. He expresses willingness to discuss this further with his primary care doctor.           Objective:    Physical Exam   EXTREMITIES: Plus one pitting edema bilaterally on both feet. Loss of protective and light touch sensation  SKIN: Venous insufficiency with hemosiderosis bilaterally on the lower legs. Altered light touch sensation on both feet. Rigid hammer toe deformities on both feet, worse on the right side.       No images are attached to the encounter.    Results          Assessment:   1. Other polyneuropathy      Plan:  Patient was evaluated and treated and all questions answered.  Assessment and Plan    Peripheral Neuropathy   He reports burning, tingling, and sensitivity, especially at night, while on Gabapentin  300mg  TID for about a year. We discussed the potential role of B12 deficiency in exacerbating neuropathy symptoms. We will increase Gabapentin  to 300mg  in the morning, 300mg  in the afternoon, and 600mg  at night, gradually increasing the dose over the next few months, as tolerated. We recommend checking B12 levels with his primary care doctor.  Hammer Toes   He reports pain at the tip of the third toe and is using pads for relief. We will continue the use of pads for symptom relief and discussed surgical intervention as a potential future option if symptoms worsen.  Venous Insufficiency   He presents with bilateral lower leg swelling and hemosiderosis, reporting intermittent swelling. We recommend the use of compression stockings and advise  to continue walking for exercise and elevate legs at the end of the day.  Follow-up in 6 months or sooner if symptoms worsen. Contact in 3 months for medication refill or dose adjustment.          Return in about 6 months (around 07/21/2023) for f/u on neuropathy and hammertoes.

## 2023-01-22 ENCOUNTER — Telehealth: Payer: Self-pay | Admitting: Pharmacist Clinician (PhC)/ Clinical Pharmacy Specialist

## 2023-01-22 ENCOUNTER — Other Ambulatory Visit (HOSPITAL_COMMUNITY): Payer: Self-pay

## 2023-01-22 ENCOUNTER — Telehealth: Payer: Self-pay | Admitting: Pharmacy Technician

## 2023-01-22 NOTE — Telephone Encounter (Signed)
 Please run test claim, pharmacy states needs PA, but not indicated on [employer].  Suspect cost higher due to new $250 deductible

## 2023-01-22 NOTE — Telephone Encounter (Signed)
 Pharmacy Patient Advocate Encounter  Received notification from [employer] that Prior Authorization for eliquis  has been APPROVED from 01/13/23 to 01/12/24. Ran test claim, Copay is $274.50 one month (DEDUCTIBLE). This test claim was processed through [facility]- copay amounts may vary at other pharmacies due to pharmacy/plan contracts, or as the patient moves through the different stages of their insurance plan.   PA #/Case ID/Reference #: [id number]

## 2023-01-22 NOTE — Telephone Encounter (Signed)
Called pharmacy to let them know prior authorization  was approved.

## 2023-01-22 NOTE — Telephone Encounter (Signed)
.  Pharmacy Patient Advocate Encounter   Received notification from Pt Calls Messages that prior authorization for eliquis  is required/requested.   Insurance verification completed.   The patient is insured through [employer] .   Per test claim: PA required; PA submitted to above mentioned insurance via CoverMyMeds Key/confirmation #/EOC [id number] Status is pending

## 2023-02-08 ENCOUNTER — Telehealth: Payer: Self-pay | Admitting: Cardiology

## 2023-02-08 MED ORDER — FUROSEMIDE 40 MG PO TABS: 40.0000 mg | ORAL_TABLET | Freq: Every day | ORAL | 2 refills | Status: DC

## 2023-02-08 NOTE — Telephone Encounter (Signed)
Patient identification verified by 2 forms. Marilynn Rail, RN     Received call from patient  Patient states:   -has SOB and swelling in legs   -on going for a few days   -SOB comes and goes, can be at rest and with activity   -takes 20mg  lasix daily and 12.5mg  spironolactone daily   -checks weight daily, does not write down. Notes a 5lb weigh gain over few days  Patient denies:   -New Chest pain/chest tightness  Informed Patient RN will speak to DOD Dr. Sw[place] for input/advisement  Patient verbalized understanding, no questions at this time

## 2023-02-08 NOTE — Telephone Encounter (Signed)
Per DOD Dr. Sw[place]:   -Lasix 40mg  BID x3 days   -The decrease to 40mg  daily  ___________________________________________________ Patient identification verified by 2 forms. Marilynn Rail, RN    Called and spoke to patient Relayed Dr. [place] recommendations  Advised patient:   -take two 20mg  lasix tablet twice a day for 3 days   -start taking 40mg  lasix daily after completing 40mg  BID for 3 days   -outreach if no improvement in symptoms  Patient verbalized understanding, no questions at this time

## 2023-02-08 NOTE — Telephone Encounter (Signed)
Pt c/o Shortness Of Breath: STAT if SOB developed within the last 24 hours or pt is noticeably SOB on the phone  1. Are you currently SOB (can you hear that pt is SOB on the phone)? yes  2. How long have you been experiencing SOB? 3-4 days  3. Are you SOB when sitting or when up moving around? Moving around  4. Are you currently experiencing any other symptoms? Legs swollen twice their normal size

## 2023-02-09 ENCOUNTER — Encounter: Payer: Self-pay | Admitting: Cardiology

## 2023-02-09 ENCOUNTER — Ambulatory Visit (HOSPITAL_BASED_OUTPATIENT_CLINIC_OR_DEPARTMENT_OTHER): Payer: Medicare HMO

## 2023-02-09 ENCOUNTER — Inpatient Hospital Stay (HOSPITAL_COMMUNITY)
Admission: EM | Admit: 2023-02-09 | Discharge: 2023-02-16 | DRG: 871 | Disposition: A | Discharge status: Hospice | Payer: Medicare HMO | Attending: Cardiology | Admitting: Cardiology

## 2023-02-09 ENCOUNTER — Ambulatory Visit (INDEPENDENT_AMBULATORY_CARE_PROVIDER_SITE_OTHER): Payer: Medicare HMO | Admitting: Cardiology

## 2023-02-09 ENCOUNTER — Encounter (HOSPITAL_COMMUNITY): Payer: Self-pay | Admitting: Emergency Medicine

## 2023-02-09 ENCOUNTER — Other Ambulatory Visit: Payer: Self-pay

## 2023-02-09 ENCOUNTER — Emergency Department (HOSPITAL_COMMUNITY): Payer: Medicare HMO

## 2023-02-09 VITALS — BP 102/58 | HR 83 | Ht 70.0 in | Wt 178.0 lb

## 2023-02-09 DIAGNOSIS — R0602 Shortness of breath: Secondary | ICD-10-CM | POA: Insufficient documentation

## 2023-02-09 DIAGNOSIS — I447 Left bundle-branch block, unspecified: Secondary | ICD-10-CM

## 2023-02-09 DIAGNOSIS — K7200 Acute and subacute hepatic failure without coma: Secondary | ICD-10-CM | POA: Diagnosis present

## 2023-02-09 DIAGNOSIS — I214 Non-ST elevation (NSTEMI) myocardial infarction: Secondary | ICD-10-CM | POA: Diagnosis not present

## 2023-02-09 DIAGNOSIS — J9601 Acute respiratory failure with hypoxia: Secondary | ICD-10-CM | POA: Diagnosis not present

## 2023-02-09 DIAGNOSIS — R110 Nausea: Secondary | ICD-10-CM | POA: Diagnosis not present

## 2023-02-09 DIAGNOSIS — I5023 Acute on chronic systolic (congestive) heart failure: Secondary | ICD-10-CM | POA: Diagnosis present

## 2023-02-09 DIAGNOSIS — Z955 Presence of coronary angioplasty implant and graft: Secondary | ICD-10-CM

## 2023-02-09 DIAGNOSIS — E7800 Pure hypercholesterolemia, unspecified: Secondary | ICD-10-CM | POA: Insufficient documentation

## 2023-02-09 DIAGNOSIS — Z95810 Presence of automatic (implantable) cardiac defibrillator: Secondary | ICD-10-CM

## 2023-02-09 DIAGNOSIS — Z452 Encounter for adjustment and management of vascular access device: Secondary | ICD-10-CM | POA: Diagnosis not present

## 2023-02-09 DIAGNOSIS — Z952 Presence of prosthetic heart valve: Secondary | ICD-10-CM | POA: Insufficient documentation

## 2023-02-09 DIAGNOSIS — R579 Shock, unspecified: Secondary | ICD-10-CM | POA: Diagnosis not present

## 2023-02-09 DIAGNOSIS — G4733 Obstructive sleep apnea (adult) (pediatric): Secondary | ICD-10-CM | POA: Diagnosis present

## 2023-02-09 DIAGNOSIS — N1832 Chronic kidney disease, stage 3b: Secondary | ICD-10-CM | POA: Diagnosis present

## 2023-02-09 DIAGNOSIS — I5021 Acute systolic (congestive) heart failure: Secondary | ICD-10-CM | POA: Diagnosis not present

## 2023-02-09 DIAGNOSIS — R54 Age-related physical debility: Secondary | ICD-10-CM | POA: Diagnosis present

## 2023-02-09 DIAGNOSIS — J810 Acute pulmonary edema: Secondary | ICD-10-CM | POA: Diagnosis not present

## 2023-02-09 DIAGNOSIS — N179 Acute kidney failure, unspecified: Principal | ICD-10-CM | POA: Diagnosis present

## 2023-02-09 DIAGNOSIS — R0600 Dyspnea, unspecified: Secondary | ICD-10-CM | POA: Diagnosis not present

## 2023-02-09 DIAGNOSIS — I517 Cardiomegaly: Secondary | ICD-10-CM | POA: Diagnosis not present

## 2023-02-09 DIAGNOSIS — J159 Unspecified bacterial pneumonia: Secondary | ICD-10-CM | POA: Diagnosis present

## 2023-02-09 DIAGNOSIS — I25119 Atherosclerotic heart disease of native coronary artery with unspecified angina pectoris: Secondary | ICD-10-CM | POA: Insufficient documentation

## 2023-02-09 DIAGNOSIS — I480 Paroxysmal atrial fibrillation: Secondary | ICD-10-CM | POA: Diagnosis not present

## 2023-02-09 DIAGNOSIS — A419 Sepsis, unspecified organism: Principal | ICD-10-CM | POA: Diagnosis present

## 2023-02-09 DIAGNOSIS — E876 Hypokalemia: Secondary | ICD-10-CM | POA: Diagnosis not present

## 2023-02-09 DIAGNOSIS — Z953 Presence of xenogenic heart valve: Secondary | ICD-10-CM

## 2023-02-09 DIAGNOSIS — E871 Hypo-osmolality and hyponatremia: Secondary | ICD-10-CM | POA: Diagnosis present

## 2023-02-09 DIAGNOSIS — E872 Acidosis, unspecified: Secondary | ICD-10-CM | POA: Diagnosis not present

## 2023-02-09 DIAGNOSIS — I5082 Biventricular heart failure: Secondary | ICD-10-CM | POA: Diagnosis not present

## 2023-02-09 DIAGNOSIS — J09X9 Influenza due to identified novel influenza A virus with other manifestations: Secondary | ICD-10-CM | POA: Diagnosis not present

## 2023-02-09 DIAGNOSIS — R918 Other nonspecific abnormal finding of lung field: Secondary | ICD-10-CM | POA: Diagnosis not present

## 2023-02-09 DIAGNOSIS — I5043 Acute on chronic combined systolic (congestive) and diastolic (congestive) heart failure: Secondary | ICD-10-CM | POA: Diagnosis not present

## 2023-02-09 DIAGNOSIS — Z79899 Other long term (current) drug therapy: Secondary | ICD-10-CM

## 2023-02-09 DIAGNOSIS — I255 Ischemic cardiomyopathy: Secondary | ICD-10-CM | POA: Diagnosis not present

## 2023-02-09 DIAGNOSIS — Z87891 Personal history of nicotine dependence: Secondary | ICD-10-CM

## 2023-02-09 DIAGNOSIS — I25810 Atherosclerosis of coronary artery bypass graft(s) without angina pectoris: Secondary | ICD-10-CM | POA: Diagnosis present

## 2023-02-09 DIAGNOSIS — I110 Hypertensive heart disease with heart failure: Secondary | ICD-10-CM | POA: Diagnosis not present

## 2023-02-09 DIAGNOSIS — Z7902 Long term (current) use of antithrombotics/antiplatelets: Secondary | ICD-10-CM

## 2023-02-09 DIAGNOSIS — I130 Hypertensive heart and chronic kidney disease with heart failure and stage 1 through stage 4 chronic kidney disease, or unspecified chronic kidney disease: Secondary | ICD-10-CM | POA: Diagnosis present

## 2023-02-09 DIAGNOSIS — J101 Influenza due to other identified influenza virus with other respiratory manifestations: Secondary | ICD-10-CM | POA: Diagnosis not present

## 2023-02-09 DIAGNOSIS — Z66 Do not resuscitate: Secondary | ICD-10-CM | POA: Diagnosis not present

## 2023-02-09 DIAGNOSIS — I509 Heart failure, unspecified: Secondary | ICD-10-CM | POA: Diagnosis not present

## 2023-02-09 DIAGNOSIS — Z8249 Family history of ischemic heart disease and other diseases of the circulatory system: Secondary | ICD-10-CM

## 2023-02-09 DIAGNOSIS — K219 Gastro-esophageal reflux disease without esophagitis: Secondary | ICD-10-CM | POA: Diagnosis not present

## 2023-02-09 DIAGNOSIS — Z515 Encounter for palliative care: Secondary | ICD-10-CM | POA: Diagnosis not present

## 2023-02-09 DIAGNOSIS — N400 Enlarged prostate without lower urinary tract symptoms: Secondary | ICD-10-CM | POA: Diagnosis present

## 2023-02-09 DIAGNOSIS — Z7189 Other specified counseling: Secondary | ICD-10-CM | POA: Diagnosis not present

## 2023-02-09 DIAGNOSIS — R6521 Severe sepsis with septic shock: Secondary | ICD-10-CM | POA: Diagnosis not present

## 2023-02-09 DIAGNOSIS — R197 Diarrhea, unspecified: Secondary | ICD-10-CM | POA: Diagnosis not present

## 2023-02-09 DIAGNOSIS — I2510 Atherosclerotic heart disease of native coronary artery without angina pectoris: Secondary | ICD-10-CM | POA: Diagnosis present

## 2023-02-09 DIAGNOSIS — Z7901 Long term (current) use of anticoagulants: Secondary | ICD-10-CM

## 2023-02-09 DIAGNOSIS — R609 Edema, unspecified: Secondary | ICD-10-CM | POA: Diagnosis not present

## 2023-02-09 DIAGNOSIS — R570 Cardiogenic shock: Secondary | ICD-10-CM | POA: Diagnosis not present

## 2023-02-09 DIAGNOSIS — I4891 Unspecified atrial fibrillation: Secondary | ICD-10-CM | POA: Diagnosis not present

## 2023-02-09 DIAGNOSIS — G928 Other toxic encephalopathy: Secondary | ICD-10-CM | POA: Diagnosis present

## 2023-02-09 LAB — ECHOCARDIOGRAM COMPLETE
AR max vel: 1.46 cm2
AV Area VTI: 0.3 cm2
AV Area mean vel: 1.34 cm2
AV Mean grad: 9 mm[Hg]
AV Peak grad: 15.4 mm[Hg]
Ao pk vel: 1.96 m/s
Area-P 1/2: 8.07 cm2
Est EF: <20
P 1/2 time: 378 ms
S' Lateral: 5.6 cm

## 2023-02-09 LAB — CBC WITH DIFFERENTIAL/PLATELET
Abs Immature Granulocytes: 0.08 10*3/uL — ABNORMAL HIGH (ref 0.00–0.07)
Basophils Absolute: 0 10*3/uL (ref 0.0–0.1)
Basophils Relative: 0 %
Eosinophils Absolute: 0 10*3/uL (ref 0.0–0.5)
Eosinophils Relative: 0 %
HCT: 35.4 % — ABNORMAL LOW (ref 39.0–52.0)
Hemoglobin: 11.3 g/dL — ABNORMAL LOW (ref 13.0–17.0)
Immature Granulocytes: 1 %
Lymphocytes Relative: 3 %
Lymphs Abs: 0.2 10*3/uL — ABNORMAL LOW (ref 0.7–4.0)
MCH: 29.3 pg (ref 26.0–34.0)
MCHC: 31.9 g/dL (ref 30.0–36.0)
MCV: 91.7 fL (ref 80.0–100.0)
Monocytes Absolute: 0.8 10*3/uL (ref 0.1–1.0)
Monocytes Relative: 12 %
Neutro Abs: 5.6 10*3/uL (ref 1.7–7.7)
Neutrophils Relative %: 84 %
Platelets: 185 10*3/uL (ref 150–400)
RBC: 3.86 MIL/uL — ABNORMAL LOW (ref 4.22–5.81)
RDW: 15.9 % — ABNORMAL HIGH (ref 11.5–15.5)
WBC: 6.7 10*3/uL (ref 4.0–10.5)
nRBC: 1.2 % — ABNORMAL HIGH (ref 0.0–0.2)

## 2023-02-09 LAB — COMPREHENSIVE METABOLIC PANEL
ALT: 719 U/L — ABNORMAL HIGH (ref 0–44)
AST: 997 U/L — ABNORMAL HIGH (ref 15–41)
Albumin: 3 g/dL — ABNORMAL LOW (ref 3.5–5.0)
Alkaline Phosphatase: 77 U/L (ref 38–126)
Anion gap: 13 (ref 5–15)
BUN: 54 mg/dL — ABNORMAL HIGH (ref 8–23)
CO2: 21 mmol/L — ABNORMAL LOW (ref 22–32)
Calcium: 8.9 mg/dL (ref 8.9–10.3)
Chloride: 99 mmol/L (ref 98–111)
Creatinine, Ser: 2.57 mg/dL — ABNORMAL HIGH (ref 0.61–1.24)
GFR, Estimated: 25 mL/min — ABNORMAL LOW (ref >60)
Glucose, Bld: 115 mg/dL — ABNORMAL HIGH (ref 70–99)
Potassium: 4.3 mmol/L (ref 3.5–5.1)
Sodium: 133 mmol/L — ABNORMAL LOW (ref 135–145)
Total Bilirubin: 3.1 mg/dL — ABNORMAL HIGH (ref 0.0–1.2)
Total Protein: 5.6 g/dL — ABNORMAL LOW (ref 6.5–8.1)

## 2023-02-09 LAB — RESP PANEL BY RT-PCR (RSV, FLU A&B, COVID)  RVPGX2
Influenza A by PCR: POSITIVE — AB
Influenza B by PCR: NEGATIVE
Resp Syncytial Virus by PCR: NEGATIVE
SARS Coronavirus 2 by RT PCR: NEGATIVE

## 2023-02-09 LAB — MAGNESIUM: Magnesium: 2.1 mg/dL (ref 1.7–2.4)

## 2023-02-09 MED ORDER — ATORVASTATIN CALCIUM 80 MG PO TABS
80.0000 mg | ORAL_TABLET | Freq: Every day | ORAL | Status: DC
  Administered 2023-02-10 – 2023-02-15 (×6): 80 mg via ORAL
  Filled 2023-02-09 (×6): qty 1

## 2023-02-09 MED ORDER — RANOLAZINE ER 500 MG PO TB12
500.0000 mg | ORAL_TABLET | Freq: Two times a day (BID) | ORAL | Status: DC
  Administered 2023-02-09 – 2023-02-15 (×13): 500 mg via ORAL
  Filled 2023-02-09 (×16): qty 1

## 2023-02-09 MED ORDER — METOPROLOL SUCCINATE ER 50 MG PO TB24
50.0000 mg | ORAL_TABLET | Freq: Every day | ORAL | Status: DC
  Administered 2023-02-10: 50 mg via ORAL
  Filled 2023-02-09: qty 1

## 2023-02-09 MED ORDER — FUROSEMIDE 10 MG/ML IJ SOLN
40.0000 mg | Freq: Once | INTRAMUSCULAR | Status: AC
  Administered 2023-02-09: 40 mg via INTRAVENOUS
  Filled 2023-02-09: qty 4

## 2023-02-09 MED ORDER — ACETAMINOPHEN 325 MG PO TABS
650.0000 mg | ORAL_TABLET | ORAL | Status: DC | PRN
  Administered 2023-02-12: 650 mg via ORAL
  Filled 2023-02-09: qty 2

## 2023-02-09 MED ORDER — SODIUM CHLORIDE 0.9% FLUSH: 3.0000 mL | INTRAVENOUS | Status: DC | PRN

## 2023-02-09 MED ORDER — PERFLUTREN LIPID MICROSPHERE
1.0000 mL | INTRAVENOUS | Status: AC | PRN
Start: 2023-02-09 — End: 2023-02-09
  Administered 2023-02-09: 1 mL via INTRAVENOUS

## 2023-02-09 MED ORDER — FUROSEMIDE 10 MG/ML IJ SOLN
80.0000 mg | Freq: Two times a day (BID) | INTRAMUSCULAR | Status: DC
  Administered 2023-02-09 – 2023-02-10 (×2): 80 mg via INTRAVENOUS
  Filled 2023-02-09 (×2): qty 8

## 2023-02-09 MED ORDER — APIXABAN 5 MG PO TABS
5.0000 mg | ORAL_TABLET | Freq: Two times a day (BID) | ORAL | Status: DC
  Administered 2023-02-09 – 2023-02-15 (×14): 5 mg via ORAL
  Filled 2023-02-09 (×14): qty 1

## 2023-02-09 MED ORDER — POTASSIUM CHLORIDE CRYS ER 20 MEQ PO TBCR
20.0000 meq | EXTENDED_RELEASE_TABLET | Freq: Once | ORAL | Status: AC
  Administered 2023-02-09: 20 meq via ORAL
  Filled 2023-02-09: qty 1

## 2023-02-09 MED ORDER — ISOSORBIDE MONONITRATE ER 30 MG PO TB24
120.0000 mg | ORAL_TABLET | Freq: Every day | ORAL | Status: DC
  Administered 2023-02-10: 120 mg via ORAL
  Filled 2023-02-09: qty 2

## 2023-02-09 MED ORDER — SPIRONOLACTONE 12.5 MG HALF TABLET
12.5000 mg | ORAL_TABLET | Freq: Every day | ORAL | Status: DC
  Administered 2023-02-10: 12.5 mg via ORAL
  Filled 2023-02-09: qty 1

## 2023-02-09 MED ORDER — PANTOPRAZOLE SODIUM 40 MG PO TBEC
40.0000 mg | DELAYED_RELEASE_TABLET | Freq: Every day | ORAL | Status: DC
  Administered 2023-02-10 – 2023-02-15 (×6): 40 mg via ORAL
  Filled 2023-02-09 (×7): qty 1

## 2023-02-09 MED ORDER — SODIUM CHLORIDE 0.9% FLUSH
3.0000 mL | Freq: Two times a day (BID) | INTRAVENOUS | Status: DC
  Administered 2023-02-09 – 2023-02-16 (×15): 3 mL via INTRAVENOUS

## 2023-02-09 MED ORDER — ONDANSETRON HCL 4 MG/2ML IJ SOLN
4.0000 mg | Freq: Four times a day (QID) | INTRAMUSCULAR | Status: DC | PRN
  Administered 2023-02-12 – 2023-02-16 (×4): 4 mg via INTRAVENOUS
  Filled 2023-02-09 (×4): qty 2

## 2023-02-09 MED ORDER — CLOPIDOGREL BISULFATE 75 MG PO TABS
75.0000 mg | ORAL_TABLET | Freq: Every day | ORAL | Status: DC
  Administered 2023-02-10 – 2023-02-15 (×6): 75 mg via ORAL
  Filled 2023-02-09 (×7): qty 1

## 2023-02-09 MED ORDER — FINASTERIDE 5 MG PO TABS
5.0000 mg | ORAL_TABLET | Freq: Every day | ORAL | Status: DC
  Administered 2023-02-10 – 2023-02-15 (×6): 5 mg via ORAL
  Filled 2023-02-09 (×7): qty 1

## 2023-02-09 MED ORDER — SODIUM CHLORIDE 0.9 % IV SOLN: 250.0000 mL | INTRAVENOUS | Status: AC | PRN

## 2023-02-09 MED ORDER — EZETIMIBE 10 MG PO TABS
10.0000 mg | ORAL_TABLET | Freq: Every day | ORAL | Status: DC
  Administered 2023-02-10 – 2023-02-15 (×6): 10 mg via ORAL
  Filled 2023-02-09 (×6): qty 1

## 2023-02-09 NOTE — ED Notes (Signed)
[name] [phone] wife called for an update

## 2023-02-09 NOTE — ED Provider Notes (Signed)
[place] [facility] DEPARTMENT AT [facility] Provider Note   CSN: [id number] Arrival date & time: 02/09/23  8469     History  Chief Complaint  Patient presents with   Shortness of Breath    [name] is a 78 y.o. male.  HPI Patient with multiple medical problems including CHF presents from cardiology clinic.  He notes that over the past week he has been progressively more dyspneic, though this is somewhat improved with supplemental oxygen.  He does not wear oxygen at home. Patient was sent here from clinic, here noted to be weak, when sitting upright.  Patient denies pain during that episode, nausea.  Patient states that he has been taking his medication as directed, without recent changes.  Per cardiology note this morning, dyspnea has markedly worsened, with substantial edema.  Patient known to have EF 40% range most recent echo prior to today, but on echocardiogram today EF was in the 20% range.   Home Medications Prior to Admission medications   Medication Sig Start Date End Date Taking? Authorizing Provider  Ascorbic Acid (VITAMIN C) 500 MG CAPS Take 500 mg by mouth daily.    [provider]  atorvastatin (LIPITOR) 80 MG tablet TAKE 1 TABLET BY MOUTH DAILY 03/23/22   [place], [name], MD  Cholecalciferol (VITAMIN D-3) 5000 units TABS Take 5,000 Units by mouth daily.     [provider]  clopidogrel (PLAVIX) 75 MG tablet Take 1 tablet (75 mg total) by mouth daily. 11/30/22   [place], [name], MD  ELIQUIS 5 MG TABS tablet TAKE 1 TABLET BY MOUTH TWICE A DAY Patient not taking: Reported on 02/09/2023 07/14/22   [place], [name], MD  ezetimibe (ZETIA) 10 MG tablet Take 1 tablet (10 mg total) by mouth daily. 07/22/22 10/20/22  [place], [name], MD  finasteride (PROSCAR) 5 MG tablet Take 5 mg by mouth daily.    [provider]  furosemide (LASIX) 40 MG tablet Take 1 tablet (40 mg total) by mouth daily. 02/08/23   [place], [name], MD  gabapentin  (NEURONTIN) 300 MG capsule Take 1 capsule (300 mg total) by mouth 2 (two) times daily with breakfast and lunch AND 2 capsules (600 mg total) at bedtime. 01/21/23 04/21/23  Edwin Cap, DPM  isosorbide mononitrate (IMDUR) 120 MG 24 hr tablet TAKE 1 TABLET BY MOUTH DAILY 03/23/22   [place], [name], MD  metoprolol succinate (TOPROL-XL) 50 MG 24 hr tablet TAKE 1 TABLET BY MOUTH DAILY WITH OR IMEDIATELY FOLLOWING A MEAL 09/22/22   Marinus Maw, MD  nitroGLYCERIN (NITROSTAT) 0.4 MG SL tablet Place 1 tablet (0.4 mg total) under the tongue every 5 (five) minutes as needed. DISSOLVE 1 TAB UNDER TONGUE FOR CHEST PAIN - IF PAIN REMAINS AFTER 5 MIN, CALL 911 AND REPEAT DOSE. MAX 3 TABS IN 15 MINUTES. 01/18/23   Lewayne Bunting, MD  oxybutynin (DITROPAN) 5 MG tablet Take 5 mg by mouth daily.  07/04/12   [provider]  pantoprazole (PROTONIX) 40 MG tablet TAKE 1 TABLET BY MOUTH DAILY 08/10/22   [place], [name], MD  ranolazine (RANEXA) 500 MG 12 hr tablet TAKE 1 TABLET BY MOUTH TWICE A DAY 01/14/23   [place], [name], MD  spironolactone (ALDACTONE) 25 MG tablet Take 0.5 tablets (12.5 mg total) by mouth daily. 11/17/22   [place], [name], MD  valACYclovir (VALTREX) 500 MG tablet TAKE ONE TABLET BY MOUTH DAILY AS NEEDED FOR OUTBREAK 06/13/19  [place], [name], MD  vitamin B-12 (CYANOCOBALAMIN) 500 MCG tablet Take 500 mcg by mouth daily.    [provider]      Allergies    Patient has no known allergies.    Review of Systems   Review of Systems  Physical Exam Updated Vital Signs BP 130/86   Pulse 62   Temp 98.8 F (37.1 C) (Oral)   Resp 17   Wt 79.4 kg   SpO2 95%   BMI 25.11 kg/m  Physical Exam Vitals and nursing note reviewed.  Constitutional:      General: He is not in acute distress.    Appearance: He is well-developed.  HENT:     Head: Normocephalic and atraumatic.  Eyes:     Conjunctiva/sclera: Conjunctivae normal.  Cardiovascular:     Rate and Rhythm: Normal rate and regular  rhythm.  Pulmonary:     Effort: Tachypnea present.  Abdominal:     General: There is no distension.  Musculoskeletal:     Right lower leg: Edema present.     Left lower leg: Edema present.  Skin:    General: Skin is warm and dry.  Neurological:     Mental Status: He is alert and oriented to person, place, and time.     ED Results / Procedures / Treatments   Labs (all labs ordered are listed, but only abnormal results are displayed) Labs Reviewed  COMPREHENSIVE METABOLIC PANEL - Abnormal; Notable for the following components:      Result Value   Sodium 133 (*)    CO2 21 (*)    Glucose, Bld 115 (*)    BUN 54 (*)    Creatinine, Ser 2.57 (*)    Total Protein 5.6 (*)    Albumin 3.0 (*)    AST 997 (*)    ALT 719 (*)    Total Bilirubin 3.1 (*)    GFR, Estimated 25 (*)    All other components within normal limits  CBC WITH DIFFERENTIAL/PLATELET - Abnormal; Notable for the following components:   RBC 3.86 (*)    Hemoglobin 11.3 (*)    HCT 35.4 (*)    RDW 15.9 (*)    nRBC 1.2 (*)    Lymphs Abs 0.2 (*)    Abs Immature Granulocytes 0.08 (*)    All other components within normal limits  RESP PANEL BY RT-PCR (RSV, FLU A&B, COVID)  RVPGX2  MAGNESIUM  BRAIN NATRIURETIC PEPTIDE    EKG EKG Interpretation Date/Time:  Tuesday February 09 2023 10:14:28 EST Ventricular Rate:  68 PR Interval:  200 QRS Duration:  166 QT Interval:  444 QTC Calculation: 472 R Axis:   -68  Text Interpretation: Atrial-sensed ventricular-paced rhythm Abnormal ECG When compared with ECG of 09-Feb-2023 08:44, PREVIOUS ECG IS PRESENT Confirmed by Gerhard Munch [phone]) on 02/09/2023 2:00:55 PM  Radiology DG Chest 1 View Result Date: 02/09/2023 CLINICAL DATA:  CHF exacerbation.  Worsening shortness of breath. EXAM: CHEST  1 VIEW COMPARISON:  Chest radiograph dated 12/27/2020. FINDINGS: No focal consolidation, pleural effusion, or pneumothorax. Stable mild cardiomegaly. Left pectoral AICD device. Median  sternotomy wires and aortic valve repair. No acute osseous pathology. IMPRESSION: 1. No active disease. 2. Mild cardiomegaly. Electronically Signed   By: Elgie Collard M.D.   On: 02/09/2023 11:57    Procedures Procedures    Medications Ordered in ED Medications  atorvastatin (LIPITOR) tablet 80 mg (has no administration in time range)  ezetimibe (ZETIA) tablet 10 mg (has no  administration in time range)  isosorbide mononitrate (IMDUR) 24 hr tablet 120 mg (has no administration in time range)  metoprolol succinate (TOPROL-XL) 24 hr tablet 50 mg (has no administration in time range)  ranolazine (RANEXA) 12 hr tablet 500 mg (has no administration in time range)  spironolactone (ALDACTONE) tablet 12.5 mg (has no administration in time range)  pantoprazole (PROTONIX) EC tablet 40 mg (has no administration in time range)  finasteride (PROSCAR) tablet 5 mg (has no administration in time range)  clopidogrel (PLAVIX) tablet 75 mg (has no administration in time range)  apixaban (ELIQUIS) tablet 5 mg (has no administration in time range)  sodium chloride flush (NS) 0.9 % injection 3 mL (has no administration in time range)  sodium chloride flush (NS) 0.9 % injection 3 mL (has no administration in time range)  0.9 %  sodium chloride infusion (has no administration in time range)  acetaminophen (TYLENOL) tablet 650 mg (has no administration in time range)  ondansetron (ZOFRAN) injection 4 mg (has no administration in time range)  furosemide (LASIX) injection 80 mg (has no administration in time range)  furosemide (LASIX) injection 40 mg (40 mg Intravenous Given 02/09/23 1219)  potassium chloride SA (KLOR-CON M) CR tablet 20 mEq (20 mEq Oral Given 02/09/23 1219)    ED Course/ Medical Decision Making/ A&P                                 Medical Decision Making Adult male with multiple medical problems including heart failure presents from cardiology clinic with concern for worsening heart failure  according to them.  Here the patient has improved symptomatically with supplemental oxygen, but has evidence for anasarca, consistent with echo with decreased ejection fraction earlier today.  Patient's initial exam is otherwise somewhat reassuring and that he is afebrile, mentating clearly, though he had an episode of disorientation while in the waiting room.  Other labs notable for worsening hepatic and renal dysfunction both complicating efforts for his heart failure. I discussed this case with our cardiology colleagues and the patient was admitted for further monitoring, management.  Amount and/or Complexity of Data Reviewed External Data Reviewed: notes.    Details: Card's notes Labs: ordered. Decision-making details documented in ED Course. Radiology: ordered and independent interpretation performed. Decision-making details documented in ED Course. ECG/medicine tests: ordered and independent interpretation performed. Decision-making details documented in ED Course.  Risk Decision regarding hospitalization. Diagnosis or treatment significantly limited by social determinants of health.   Patient continues to require supplemental oxygen.  Case discussed with our cardiology team.  Patient aware of the need for admission.  Final Clinical Impression(s) / ED Diagnoses Final diagnoses:  AKI (acute kidney injury) (HCC)  Acute on chronic congestive heart failure, unspecified heart failure type (HCC)  Acute respiratory failure with hypoxia (HCC)   CRITICAL CARE Performed by: Gerhard Munch Total critical care time: 35 minutes Critical care time was exclusive of separately billable procedures and treating other patients. Critical care was necessary to treat or prevent imminent or life-threatening deterioration. Critical care was time spent personally by me on the following activities: development of treatment plan with patient and/or surrogate as well as nursing, discussions with consultants,  evaluation of patient's response to treatment, examination of patient, obtaining history from patient or surrogate, ordering and performing treatments and interventions, ordering and review of laboratory studies, ordering and review of radiographic studies, pulse oximetry and re-evaluation of patient's condition.  Gerhard Munch, MD 02/09/23 [phone]

## 2023-02-09 NOTE — H&P (Addendum)
Cardiology History and Physical .   Date:  02/09/2023  ID:  [name], DOB 06/17/1945, MRN [id number] PCP: Corrie Mckusick, MD  [facility] Providers Cardiologist:  Peter [place], MD Electrophysiologist:  Lewayne Bunting, MD     History of Present Illness: .   [name] is a 78 y.o. male Discussed with the use of AI scribe    History of Present Illness   The patient is a 78 year old male with coronary artery disease ischemic cardiomyopathy and chronic systolic heart failure with Bi V ICD who presents with acute on chronic systolic heart failure exacerbation during ECHO evaluation as outpatient.   He is experiencing significantly worsened dyspnea over the past week, increased work of breathing, and NYHA class IV symptoms. He has chronic mild pedal edema, currently 3 to 4 plus, and positive orthopnea. No fevers or chills, but he has a mild cough and occasional diarrhea.   He has a history of coronary artery disease, having undergone coronary artery bypass surgery in 1996 and PCI to the saphenous vein graft to OM2 in 2016. In December 2022, a cardiac catheterization revealed severe three-vessel coronary artery disease with an occluded saphenous vein graft to OM1 and OM3, a 95% lesion in the sequential saphenous vein graft to PDA and posterior lateral, and a patent LIMA to LAD. He underwent PCI to the vein graft to the posterior lateral branch or PDA at that time.   His most recent echocardiogram in March 2023 showed normal LV function, visually estimated to be in the 40% range, with grade 3 diastolic dysfunction, mild right ventricular enlargement, and mild to moderate mitral regurgitation. A preliminary echocardiogram today shows an ejection fraction in the 20% range, mild LVH, a mildly dilated left ventricular cavity at 6.2 cm, moderate pulmonary pressures estimated at approximately 30 mmHg, a flattened interventricular septum, and a severely dilated left atrium.   He has  hyperlipidemia, with his most recent LDL at 88, and is on atorvastatin 80 mg daily. He has paroxysmal atrial fibrillation and is on Eliquis 5 mg twice daily. His other medications include furosemide 40 mg daily, isosorbide 120 mg daily, Toprol XL 50 mg daily, Ranexa 500 mg twice daily, spironolactone 12.5 mg daily, and Plavix 75 mg daily. He has not been able to take Entresto or Jardiance due to expense and had a prior syncopal episode with losartan. His recent creatinine level is 1.75, up from a prior 1.63, and potassium is 4.8. Hemoglobin is 13.1.   He is a prior smoker but has not been diagnosed with COPD.               ROS: Orthopnea, SOB, chronic CP   Studies Reviewed: Marland Kitchen   EKG Interpretation Date/Time:                  Tuesday February 09 2023 08:44:28 EST Ventricular Rate:         83 PR Interval:                 102 QRS Duration:             166 QT Interval:                 408 QTC Calculation:479 R Axis:                         -75   Text Interpretation:      Atrial-sensed ventricular-paced  rhythm with occasional Premature ventricular complexes When compared with ECG of 18-Jan-2023 14:12, Premature ventricular complexes are now Present Vent. rate has increased BY   9 BPM Confirmed by Donato Schultz ([id number]) on 02/09/2023 8:50:45 AM     Results   LABS LDL: 88 Cr: 1.75 K: 4.8 Hb: 13.1   RADIOLOGY Chest x-ray: No signs of pulmonary edema (2022)   DIAGNOSTIC Echocardiogram: Ejection fraction 20%, mild LVH, mildly dilated left ventricular cavity 6.2 cm, moderate pulmonary pressures 30 mmHg, flattened interventricular septum, ICD lead in place, mean aortic valve gradient 7 mmHg, trivial aortic regurgitation, severely dilated left atrium, IVC 1.5 cm and collapsing (02/09/2023)     Risk Assessment/Calculations:             Physical Exam:   VS:  BP (!) 102/58   Pulse 83   Ht 5\' 10"  (1.778 m)   Wt 178 lb (80.7 kg)   SpO2 92%   BMI 25.54 kg/m       Wt Readings from Last 3  Encounters:  02/09/23 178 lb (80.7 kg)  01/21/23 165 lb (74.8 kg)  01/18/23 176 lb (79.8 kg)    GEN: Elderly, short of breath NECK: No JVD; No carotid bruits CARDIAC: RRR, soft systolic murmur, no rubs, no gallops RESPIRATORY:  Mildly decreased at bases. Increased work of breathing, tripoding sitting up.   ABDOMEN: Soft, non-tender, non-distended EXTREMITIES:  3-4+ BLE edema; No deformity    ASSESSMENT AND PLAN: .     Assessment and Plan    Acute on Chronic Systolic Heart Failure Exacerbation Acute exacerbation of chronic systolic heart failure secondary to ischemic cardiomyopathy. Presents with significantly worsened dyspnea over the past week, NYHA class IV heart failure, and 3-4+ pedal edema. Recent echocardiogram shows ejection fraction ~20%, mild LVH, mildly dilated left ventricular cavity, and moderate pulmonary pressures. Unable to afford Entresto or Jardiance. Prior syncopal episode with losartan. Discussed risks of IV furosemide including potential renal impairment and the need for close monitoring. Benefits include rapid symptom relief and reduction in fluid overload. Alternatives such as oral diuretics were considered but deemed less effective in this acute setting. - EMS, hospital transport, admit. ER. Notify cardiology team.  - Administer IV furosemide 80 mg IV bid - Monitor renal function closely - Obtain chest x-ray   Coronary Artery Disease Severe three-vessel coronary artery disease with multiple interventions including CABG (1996), PCI to saphenous vein graft to OM2 (2016), and TAVR (2018). Most recent cardiac catheterization (Dec 2022) showed severe three-vessel disease with occluded saphenous vein grafts and a 95% lesion in sequential saphenous vein graft to PDA and posterior lateral. Patent LIMA to LAD. Recent PCI to vein graft to posterior lateral branch or PDA. Discussed the importance of continuing current medications to manage symptoms and prevent further cardiac  events. - Continue current medications: atorvastatin 80 mg daily, Eliquis 5 mg bid, isosorbide 120 mg daily, Toprol XL 50 mg daily, Ranexa 500 mg bid, spironolactone 12.5 mg daily, Plavix 75 mg daily   Paroxysmal Atrial Fibrillation Paroxysmal atrial fibrillation managed with Eliquis 5 mg bid (has not been taking). Discussed the importance of anticoagulation to prevent thromboembolic events. - Currently sinus rhythm, ICD - BI-V noted AV paced.    Hyperlipidemia Hyperlipidemia managed with atorvastatin 80 mg daily. Most recent LDL was 88, with a goal of <70. Discussed the importance of achieving LDL goal to reduce cardiovascular risk. - Continue atorvastatin 80 mg daily   General Health Maintenance Prior smoker. No recent signs of pulmonary  edema on chest x-ray (2022). Scheduled for a PET stress test as an outpatient. - Perform PET stress test as scheduled   Follow-up - Admit to hospital for management of acute on chronic systolic heart failure exacerbation.          Signed, Donato Schultz, MD    ADDEN: ALT/AST elevated [phone] - could be hepatic congestion. Creat 2.57 up. Diurese. If LFT's don't come down, may need GI eval.   Donato Schultz, MD

## 2023-02-09 NOTE — ED Notes (Signed)
Pt was in wheelchair, stood up and accidentally urinated on himself. PT was assisted to restroom, changed, and a brief placed. Pt reported to this RN "I don't know where" I am. Confusion noted. Charge, RN aware. Pt to be placed in room 19

## 2023-02-09 NOTE — ED Triage Notes (Addendum)
Presents from [facility] for CHF exacerbation, increased edema x1 week, new worsened EF today (20% from close to normal at last visit), increased SOB, N/D.  Doubled his lasix dose for these sx at home without relief.  EMS eval/interventions: Arrives on 2L [place] for comfort (RA baseline, 93% RA with EMS), 112/68, 88bpm (AV pacing, ICD), lungs clear, 2+edema, 24RR, afebrile, A&Ox4 , 18G R AC

## 2023-02-09 NOTE — ED Notes (Signed)
ED TO INPATIENT HANDOFF REPORT  ED Nurse Name and Phone #: [phone]  S Name/Age/Gender [name] 78 y.o. male Room/Bed: 009C/009C  Code Status   Code Status: Limited: Do not attempt resuscitation (DNR) -DNR-LIMITED -Do Not Intubate/DNI   Home/SNF/Other Home Patient oriented to: situation Is this baseline? Yes   Triage Complete: Triage complete  Chief Complaint Acute CHF [facility]) [I50.9]  Triage Note [facility] for [facility]F exacerbation, increased edema x1 week, new worsened EF today (20% from close to normal at last visit), increased SOB, N/D.  Doubled his lasix dose for these sx at home without relief.  EMS eval/interventions: Arrives on 2L  for comfort (RA baseline, 93% RA with EMS), 112/68, 88bpm (AV pacing, ICD), lungs clear, 2+edema, 24RR, afebrile, A&Ox4 , 18G R AC     Allergies No Known Allergies  Level of Care/Admitting Diagnosis ED Disposition     ED Disposition  Admit   Condition  --   Comment  Hospital Area: [facility] [[id number]]  Level of Care: Telemetry Cardiac [[id number]]  May admit patient to [name] or [name] if equivalent level of care is available:: No  Covid Evaluation: Asymptomatic - no recent exposure (last 10 days) testing not required  Diagnosis: Acute CHF [facility]) [[id number]]  Admitting Physician: Jake Bathe [[id number]]  Attending Physician: Jake Bathe [[id number]]  Certification:: I certify this patient will need inpatient services for at least 2 midnights  Expected Medical Readiness: 02/11/2023          B Medical/Surgery History Past Medical History:  Diagnosis Date   Aortic stenosis    Barrett's esophageal ulceration    Chronic systolic CHF (congestive heart failure) (HCC)    Coronary artery disease    a. CABG 1996: LIMA graft to the LAD and diagonal, sequential vein graft to the acute marginal, PDA, and posterior lateral branches of the right coronary, sequential saphenous vein graft to the obtuse  marginal vessel and distal circumflex. b. Cath 2007 patent grafts. c. s/p DES to SVG-OM2 in 03/2014.   Coronary artery disease involving autologous vein coronary bypass graft with angina pectoris (HCC) 03/13/2014   PCI using DES in SVG to LCx system   Dental caries    pre-heart valve surgery protocol   Gastroesophageal reflux disease    History of erectile dysfunction    History of hiatal hernia    History of kidney stones    History of obesity    History of prostatitis    Hypercholesterolemia    Hypertension    Kidney stones    LBBB (left bundle branch block)    Nodule of kidney 02/04/2016   Incidental 8mm nodule upper pole right kidney noted on CT angiogram - MRI with and without gadolinium contrast recommened in 6 months   OSA (obstructive sleep apnea)    "suppose to wear mask; I don't" (03/13/2014)   PVC's (premature ventricular contractions)    S/P CABG x 7 01/07/1995   LIMA to LAD-diagonal, SVG to OM-LCx, SVG to AM-PD-RPL   S/P TAVR (transcatheter aortic valve replacement) 02/18/2016   26 mm Edwards Sapien 3 transcatheter heart valve placed via percutaneous right transfemoral approach   Past Surgical History:  Procedure Laterality Date   CARDIAC CATHETERIZATION  1996; ~ 2006   ; Ejection fraction is estimated at 45%   CARDIAC CATHETERIZATION N/A 01/22/2016   Procedure: Right/Left Heart Cath and Coronary/Graft Angiography;  Surgeon: [name] Swaziland, MD;  Location: [facility] INVASIVE CV LAB;  Service: Cardiovascular;  Laterality: N/A;   CHOLECYSTECTOMY N/A 01/23/2014   Procedure: LAPAROSCOPIC CHOLECYSTECTOMY WITH INTRAOPERATIVE CHOLANGIOGRAM ;  Surgeon: Claud Kelp, MD;  Location: [facility] OR;  Service: General;  Laterality: N/A;   COLONOSCOPY W/ POLYPECTOMY     CORONARY ANGIOPLASTY WITH STENT PLACEMENT  03/13/2014   "1"   CORONARY ARTERY BYPASS GRAFT  1996   "CABG X 7"   CORONARY STENT INTERVENTION N/A 12/26/2020   Procedure: CORONARY STENT INTERVENTION;  Surgeon: Swaziland, [name], MD;   Location: [facility] INVASIVE CV LAB;  Service: Cardiovascular;  Laterality: N/A;   CYSTOSCOPY WITH STENT PLACEMENT     EP IMPLANTABLE DEVICE N/A 12/17/2014   Procedure: BiV ICD Insertion CRT-D;  Surgeon: Marinus Maw, MD;  Location: [name] INVASIVE CV LAB;  Service: Cardiovascular;  Laterality: N/A;   ICD GENERATOR CHANGEOUT N/A 06/02/2022   Procedure: ICD GENERATOR CHANGEOUT;  Surgeon: Marinus Maw, MD;  Location: [facility] INVASIVE CV LAB;  Service: Cardiovascular;  Laterality: N/A;   LEFT HEART CATH AND CORS/GRAFTS ANGIOGRAPHY N/A 12/26/2020   Procedure: LEFT HEART CATH AND CORS/GRAFTS ANGIOGRAPHY;  Surgeon: Swaziland, [name], MD;  Location: [facility] INVASIVE CV LAB;  Service: Cardiovascular;  Laterality: N/A;   LEFT HEART CATHETERIZATION WITH CORONARY/GRAFT ANGIOGRAM N/A 03/13/2014   Procedure: LEFT HEART CATHETERIZATION WITH Isabel Caprice;  Surgeon: [name] Swaziland, MD;  Location: [facility] CATH LAB;  Service: Cardiovascular;  Laterality: N/A;   MULTIPLE EXTRACTIONS WITH ALVEOLOPLASTY N/A 02/07/2016   Procedure: Extraction of tooth #'s 1,2,3,14,15, 18, 20, 29,30,31 with alveoloplasty and dental cleaning of teeth.;  Surgeon: Charlynne Pander, DDS;  Location: [facility] OR;  Service: Oral Surgery;  Laterality: N/A;   TEE WITHOUT CARDIOVERSION N/A 02/18/2016   Procedure: TRANSESOPHAGEAL ECHOCARDIOGRAM (TEE);  Surgeon: Tonny Bollman, MD;  Location: [facility] OR;  Service: Open Heart Surgery;  Laterality: N/A;   TONSILLECTOMY  ~ 1950   TRANSCATHETER AORTIC VALVE REPLACEMENT, TRANSFEMORAL N/A 02/18/2016   Procedure: TRANSCATHETER AORTIC VALVE REPLACEMENT, TRANSFEMORAL;  Surgeon: Tonny Bollman, MD;  Location: [facility] OR;  Service: Open Heart Surgery;  Laterality: N/A;     A IV Location/Drains/Wounds Patient Lines/Drains/Airways Status     Active Line/Drains/Airways     Name Placement date Placement time Site Days   Peripheral IV 02/09/23 18 G Right Antecubital 02/09/23  1211  Antecubital  less than 1   CVC Double Lumen 02/18/16 Right  Internal jugular 02/18/16  0947  -- 2548   Incision - 4 Ports Abdomen 1: Umbilicus 2: Mid;Upper Right;Lateral Right;Medial 01/23/14  1203  -- 3304            Intake/Output Last 24 hours No intake or output data in the 24 hours ending 02/09/23 1948  Labs/Imaging Results for orders placed or performed during the hospital encounter of 02/09/23 (from the past 48 hours)  Magnesium     Status: None   Collection Time: 02/09/23 10:15 AM  Result Value Ref Range   Magnesium 2.1 1.7 - 2.4 mg/dL    Comment: Performed at [facility] Lab, [address] [place], [place] [address]  Comprehensive metabolic panel     Status: Abnormal   Collection Time: 02/09/23 10:15 AM  Result Value Ref Range   Sodium 133 (L) 135 - 145 mmol/L   Potassium 4.3 3.5 - 5.1 mmol/L   Chloride 99 98 - 111 mmol/L   CO2 21 (L) 22 - 32 mmol/L   Glucose, Bld 115 (H) 70 - 99 mg/dL    Comment: Glucose reference range applies  only to samples taken after fasting for at least 8 hours.   BUN 54 (H) 8 - 23 mg/dL   Creatinine, Ser 1.61 (H) 0.61 - 1.24 mg/dL   Calcium 8.9 8.9 - 09.6 mg/dL   Total Protein 5.6 (L) 6.5 - 8.1 g/dL   Albumin 3.0 (L) 3.5 - 5.0 g/dL   AST 045 (H) 15 - 41 U/L   ALT 719 (H) 0 - 44 U/L   Alkaline Phosphatase 77 38 - 126 U/L   Total Bilirubin 3.1 (H) 0.0 - 1.2 mg/dL   GFR, Estimated 25 (L) >60 mL/min    Comment: (NOTE) Calculated using the CKD-EPI Creatinine Equation (2021)    Anion gap 13 5 - 15    Comment: Performed at [facility] Lab, [address] [place], [place] [address]  CBC with Differential/Platelet     Status: Abnormal   Collection Time: 02/09/23 10:15 AM  Result Value Ref Range   WBC 6.7 4.0 - 10.5 K/uL   RBC 3.86 (L) 4.22 - 5.81 MIL/uL   Hemoglobin 11.3 (L) 13.0 - 17.0 g/dL   HCT 19.1 (L) 47.8 - 29.5 %   MCV 91.7 80.0 - 100.0 fL   MCH 29.3 26.0 - 34.0 pg   MCHC 31.9 30.0 - 36.0 g/dL   RDW 62.1 (H) 30.8 - 65.7 %   Platelets 185 150 - 400 K/uL   nRBC 1.2 (H) 0.0 - 0.2 %    Neutrophils Relative % 84 %   Neutro Abs 5.6 1.7 - 7.7 K/uL   Lymphocytes Relative 3 %   Lymphs Abs 0.2 (L) 0.7 - 4.0 K/uL   Monocytes Relative 12 %   Monocytes Absolute 0.8 0.1 - 1.0 K/uL   Eosinophils Relative 0 %   Eosinophils Absolute 0.0 0.0 - 0.5 K/uL   Basophils Relative 0 %   Basophils Absolute 0.0 0.0 - 0.1 K/uL   Immature Granulocytes 1 %   Abs Immature Granulocytes 0.08 (H) 0.00 - 0.07 K/uL    Comment: Performed at [facility] Lab, [address] [place], [place] [address]  Resp panel by RT-PCR (RSV, Flu A&B, Covid) Anterior Nasal Swab     Status: Abnormal   Collection Time: 02/09/23  3:44 PM   Specimen: Anterior Nasal Swab  Result Value Ref Range   SARS Coronavirus 2 by RT PCR NEGATIVE NEGATIVE   Influenza A by PCR POSITIVE (A) NEGATIVE   Influenza B by PCR NEGATIVE NEGATIVE    Comment: (NOTE) The Xpert Xpress SARS-CoV-2/FLU/RSV plus assay is intended as an aid in the diagnosis of influenza from Nasopharyngeal swab specimens and should not be used as a sole basis for treatment. Nasal washings and aspirates are unacceptable for Xpert Xpress SARS-CoV-2/FLU/RSV testing.  Fact Sheet for Patients: BloggerCourse.com  Fact Sheet for Healthcare Providers: SeriousBroker.it  This test is not yet approved or cleared by the [place] FDA and has been authorized for detection and/or diagnosis of SARS-CoV-2 by FDA under an Emergency Use Authorization (EUA). This EUA will remain in effect (meaning this test can be used) for the duration of the COVID-19 declaration under Section 564(b)(1) of the Act, 21 U.S.C. section 360bbb-3(b)(1), unless the authorization is terminated or revoked.     Resp Syncytial Virus by PCR NEGATIVE NEGATIVE    Comment: (NOTE) Fact Sheet for Patients: BloggerCourse.com  Fact Sheet for Healthcare Providers: SeriousBroker.it  This test is not  yet approved or cleared by the [place] FDA and has been authorized for detection  and/or diagnosis of SARS-CoV-2 by FDA under an Emergency Use Authorization (EUA). This EUA will remain in effect (meaning this test can be used) for the duration of the COVID-19 declaration under Section 564(b)(1) of the Act, 21 U.S.C. section 360bbb-3(b)(1), unless the authorization is terminated or revoked.  Performed at [facility], [address], [place], [place] [address]    ECHOCARDIOGRAM COMPLETE Result Date: 02/09/2023    ECHOCARDIOGRAM REPORT   Patient Name:   [name] Date of Exam: 02/09/2023 Medical Rec #:  [id number]        Height:       70.0 in Accession #:    [id number]       Weight:       165.0 lb Date of Birth:  05-04-1945         BSA:          1.923 m Patient Age:    77 years         BP:           128/64 mmHg Patient Gender: M                HR:           82 bpm. Exam Location:  [address] Procedure: 2D Echo, Cardiac Doppler and Color Doppler Indications:    I35.9 Aortic valve disorder  History:        Patient has prior history of Echocardiogram examinations, most                 recent 03/18/2021. Ischemic cardiomyopathy and CHF, CAD, Prior                 CABG and Pacemaker, Arrythmias:Atrial Fibrillation; Risk                 Factors:Hypertension, HLD and Sleep Apnea.                 Aortic Valve: 26 mm Sapien prosthetic, stented (TAVR) valve is                 present in the aortic position. Procedure Date: 02/18/2016.  Sonographer:    Clearence Ped RCS Referring Phys: [id number] BRIAN S CRENSHAW IMPRESSIONS  1. Left ventricular ejection fraction, by estimation, is <20%. The left ventricle has severely decreased function. The left ventricle demonstrates regional wall motion abnormalities (see scoring diagram/findings for description). The left ventricular internal cavity size was severely dilated. There is mild left ventricular hypertrophy. Left ventricular diastolic function could not be  evaluated. Elevated left ventricular end-diastolic pressure. There is the interventricular septum is flattened in systole and diastole, consistent with right ventricular pressure and volume overload.  2. Right ventricular systolic function is mildly reduced. The right ventricular size is severely enlarged. There is mildly elevated pulmonary artery systolic pressure.  3. Left atrial size was severely dilated.  4. Right atrial size was moderately dilated.  5. The mitral valve is normal in structure. Moderate mitral valve regurgitation. No evidence of mitral stenosis.  6. Tricuspid valve regurgitation is moderate to severe.  7. The aortic valve has been repaired/replaced. Aortic valve regurgitation is trivial. There is a 26 mm Sapien prosthetic (TAVR) valve present in the aortic position. Procedure Date: 02/18/2016. Echo findings are consistent with perivalvular leak of the  aortic prosthesis. Comparison(s): Changes from prior study are noted. Conclusion(s)/Recommendation(s): Significant change from prior, reviewed by DOD today and patient sent to ER for admission. Severely reduced LVEF, with akinesis of  lateral wall and portions of inferior/anterior walls. Even with echo contrast, anterior wall incompletely visualized. FINDINGS  Left Ventricle: Left ventricular ejection fraction, by estimation, is <20%. The left ventricle has severely decreased function. The left ventricle demonstrates regional wall motion abnormalities. Definity contrast agent was given IV to delineate the left ventricular endocardial borders. The left ventricular internal cavity size was severely dilated. There is mild left ventricular hypertrophy. The interventricular septum is flattened in systole and diastole, consistent with right ventricular pressure  and volume overload. Left ventricular diastolic function could not be evaluated due to paced rhythm. Left ventricular diastolic function could not be evaluated. Elevated left ventricular  end-diastolic pressure. The E/e' is 22.  LV Wall Scoring: The entire lateral wall, mid anterior segment, and basal inferior segment are akinetic. The entire septum, mid and distal inferior wall, apical anterior segment, basal anterior segment, and apex are hypokinetic. Right Ventricle: The right ventricular size is severely enlarged. No increase in right ventricular wall thickness. Right ventricular systolic function is mildly reduced. There is mildly elevated pulmonary artery systolic pressure. The tricuspid regurgitant velocity is 2.97 m/s, and with an assumed right atrial pressure of 8 mmHg, the estimated right ventricular systolic pressure is 43.3 mmHg. Left Atrium: Left atrial size was severely dilated. Right Atrium: Right atrial size was moderately dilated. Pericardium: There is no evidence of pericardial effusion. Mitral Valve: The mitral valve is normal in structure. Moderate mitral valve regurgitation, with posteriorly-directed jet. No evidence of mitral valve stenosis. Tricuspid Valve: The tricuspid valve is normal in structure. Tricuspid valve regurgitation is moderate to severe. No evidence of tricuspid stenosis. Aortic Valve: The aortic valve has been repaired/replaced. Aortic valve regurgitation is trivial. Aortic regurgitation PHT measures 378 msec. Aortic valve mean gradient measures 9.0 mmHg. Aortic valve peak gradient measures 15.4 mmHg. Aortic valve area, by VTI measures 0.30 cm. There is a 26 mm Sapien prosthetic, stented (TAVR) valve present in the aortic position. Procedure Date: 02/18/2016. Pulmonic Valve: The pulmonic valve was normal in structure. Pulmonic valve regurgitation is trivial. No evidence of pulmonic stenosis. Aorta: The aortic root and ascending aorta are structurally normal, with no evidence of dilitation. Venous: The inferior vena cava was not well visualized. IAS/Shunts: The atrial septum is grossly normal. Additional Comments: A device lead is visualized.  LEFT VENTRICLE  PLAX 2D LVIDd:         6.20 cm   Diastology LVIDs:         5.60 cm   LV e' medial:    4.00 cm/s LV PW:         1.10 cm   LV E/e' medial:  23.3 LV IVS:        1.30 cm   LV e' lateral:   4.28 cm/s LVOT diam:     2.00 cm   LV E/e' lateral: 21.8 LV SV:         50 LV SV Index:   26 LVOT Area:     3.14 cm  RIGHT VENTRICLE RV Basal diam:  5.39 cm RV S prime:     7.99 cm/s TAPSE (M-mode): 1.6 cm RVSP:           38.3 mmHg LEFT ATRIUM              Index        RIGHT ATRIUM           Index LA diam:        5.60 cm  2.91 cm/m   RA  Pressure: 3.00 mmHg LA Vol (A2C):   91.4 ml  47.52 ml/m  RA Area:     18.30 cm LA Vol (A4C):   102.0 ml 53.03 ml/m  RA Volume:   57.90 ml  30.10 ml/m LA Biplane Vol: 95.6 ml  49.70 ml/m  AORTIC VALVE AV Area (Vmax):    1.46 cm AV Area (Vmean):   1.34 cm AV Area (VTI):     0.30 cm AV Vmax:           196.00 cm/s AV Vmean:          141.000 cm/s AV VTI:            1.646 m AV Peak Grad:      15.4 mmHg AV Mean Grad:      9.0 mmHg LVOT Vmax:         91.00 cm/s LVOT Vmean:        60.100 cm/s LVOT VTI:          0.158 m LVOT/AV VTI ratio: 0.10 AI PHT:            378 msec  AORTA Ao Root diam: 3.10 cm Ao Asc diam:  2.90 cm MITRAL VALVE               TRICUSPID VALVE MV Area (PHT):             TR Peak grad:   35.3 mmHg MV Decel Time:             TR Vmax:        297.00 cm/s MV E velocity: 93.30 cm/s  Estimated RAP:  3.00 mmHg MV A velocity: 68.10 cm/s  RVSP:           38.3 mmHg MV E/A ratio:  1.37                            SHUNTS                            Systemic VTI:  0.16 m                            Systemic Diam: 2.00 cm Jodelle Red MD Electronically signed by Jodelle Red MD Signature Date/Time: 02/09/2023/3:33:20 PM    Final    DG Chest 1 View Result Date: 02/09/2023 CLINICAL DATA:  CHF exacerbation.  Worsening shortness of breath. EXAM: CHEST  1 VIEW COMPARISON:  Chest radiograph dated 12/27/2020. FINDINGS: No focal consolidation, pleural effusion, or pneumothorax. Stable mild  cardiomegaly. Left pectoral AICD device. Median sternotomy wires and aortic valve repair. No acute osseous pathology. IMPRESSION: 1. No active disease. 2. Mild cardiomegaly. Electronically Signed   By: Elgie Collard M.D.   On: 02/09/2023 11:57    Pending Labs Unresulted Labs (From admission, onward)     Start     Ordered   02/10/23 0500  Basic metabolic panel  Daily,   R     Comments: As Scheduled for 5 days    02/09/23 1441   02/09/23 1005  Brain natriuretic peptide (order ONLY if patient c/o SOB)  Once,   URGENT        02/09/23 1005            Vitals/Pain Today's Vitals   02/09/23 1539 02/09/23 1727 02/09/23 1836 02/09/23 1840  BP: 109/84  114/65  Pulse: 63   71  Resp: 18   19  Temp:    97.9 F (36.6 C)  TempSrc:    Oral  SpO2: 98%   100%  Weight:      PainSc: 0-No pain 0-No pain 0-No pain     Isolation Precautions No active isolations  Medications Medications  atorvastatin (LIPITOR) tablet 80 mg (has no administration in time range)  ezetimibe (ZETIA) tablet 10 mg (has no administration in time range)  isosorbide mononitrate (IMDUR) 24 hr tablet 120 mg (has no administration in time range)  metoprolol succinate (TOPROL-XL) 24 hr tablet 50 mg (has no administration in time range)  ranolazine (RANEXA) 12 hr tablet 500 mg (has no administration in time range)  spironolactone (ALDACTONE) tablet 12.5 mg (has no administration in time range)  pantoprazole (PROTONIX) EC tablet 40 mg (has no administration in time range)  finasteride (PROSCAR) tablet 5 mg (has no administration in time range)  clopidogrel (PLAVIX) tablet 75 mg (has no administration in time range)  apixaban (ELIQUIS) tablet 5 mg (5 mg Oral Given 02/09/23 1537)  sodium chloride flush (NS) 0.9 % injection 3 mL (3 mLs Intravenous Given 02/09/23 1538)  sodium chloride flush (NS) 0.9 % injection 3 mL (has no administration in time range)  0.9 %  sodium chloride infusion (has no administration in time range)   acetaminophen (TYLENOL) tablet 650 mg (has no administration in time range)  ondansetron (ZOFRAN) injection 4 mg (has no administration in time range)  furosemide (LASIX) injection 80 mg (80 mg Intravenous Given 02/09/23 1704)  furosemide (LASIX) injection 40 mg (40 mg Intravenous Given 02/09/23 1219)  potassium chloride SA (KLOR-CON M) CR tablet 20 mEq (20 mEq Oral Given 02/09/23 1219)    Mobility walks with device     Focused Assessments Pulmonary Assessment Handoff:  Lung sounds: L Breath Sounds: Fine crackles R Breath Sounds: Fine crackles O2 Device: Room Air O2 Flow Rate (L/min): 2 L/min    R Recommendations: See Admitting Provider Note  Report given to:   Additional Notes: N/A

## 2023-02-09 NOTE — Progress Notes (Signed)
Cardiology History and Physical .   Date:  02/09/2023  ID:  [name], DOB [birth year], MRN [id number] PCP: Corrie Mckusick, MD  [facility] Providers Cardiologist:  Peter Swaziland, MD Electrophysiologist:  Lewayne Bunting, MD     History of Present Illness: .   [name] is a 78 y.o. male Discussed with the use of AI scribe   History of Present Illness   The patient is a 78 year old male with coronary artery disease ischemic cardiomyopathy and chronic systolic heart failure with Bi V ICD who presents with acute on chronic systolic heart failure exacerbation during ECHO evaluation as outpatient.  He is experiencing significantly worsened dyspnea over the past week, increased work of breathing, and NYHA class IV symptoms. He has chronic mild pedal edema, currently 3 to 4 plus, and positive orthopnea. No fevers or chills, but he has a mild cough and occasional diarrhea.  He has a history of coronary artery disease, having undergone coronary artery bypass surgery in 1996 and PCI to the saphenous vein graft to OM2 in 2016. In December 2022, a cardiac catheterization revealed severe three-vessel coronary artery disease with an occluded saphenous vein graft to OM1 and OM3, a 95% lesion in the sequential saphenous vein graft to PDA and posterior lateral, and a patent LIMA to LAD. He underwent PCI to the vein graft to the posterior lateral branch or PDA at that time.  His most recent echocardiogram in March 2023 showed normal LV function, visually estimated to be in the 40% range, with grade 3 diastolic dysfunction, mild right ventricular enlargement, and mild to moderate mitral regurgitation. A preliminary echocardiogram today shows an ejection fraction in the 20% range, mild LVH, a mildly dilated left ventricular cavity at 6.2 cm, moderate pulmonary pressures estimated at approximately 30 mmHg, a flattened interventricular septum, and a severely dilated left atrium.  He has  hyperlipidemia, with his most recent LDL at 88, and is on atorvastatin 80 mg daily. He has paroxysmal atrial fibrillation and is on Eliquis 5 mg twice daily. His other medications include furosemide 40 mg daily, isosorbide 120 mg daily, Toprol XL 50 mg daily, Ranexa 500 mg twice daily, spironolactone 12.5 mg daily, and Plavix 75 mg daily. He has not been able to take Entresto or Jardiance due to expense and had a prior syncopal episode with losartan. His recent creatinine level is 1.75, up from a prior 1.63, and potassium is 4.8. Hemoglobin is 13.1.  He is a prior smoker but has not been diagnosed with COPD.          ROS: Orthopnea, SOB, chronic CP  Studies Reviewed: Marland Kitchen   EKG Interpretation Date/Time:  Tuesday February 09 2023 08:44:28 EST Ventricular Rate:  83 PR Interval:  102 QRS Duration:  166 QT Interval:  408 QTC Calculation: 479 R Axis:   -75  Text Interpretation: Atrial-sensed ventricular-paced rhythm with occasional Premature ventricular complexes When compared with ECG of 18-Jan-2023 14:12, Premature ventricular complexes are now Present Vent. rate has increased BY   9 BPM Confirmed by Donato Schultz ([id number]) on 02/09/2023 8:50:45 AM    Results   LABS LDL: 88 Cr: 1.75 K: 4.8 Hb: 13.1  RADIOLOGY Chest x-ray: No signs of pulmonary edema (2022)  DIAGNOSTIC Echocardiogram: Ejection fraction 20%, mild LVH, mildly dilated left ventricular cavity 6.2 cm, moderate pulmonary pressures 30 mmHg, flattened interventricular septum, ICD lead in place, mean aortic valve gradient 7 mmHg, trivial aortic regurgitation, severely dilated left atrium, IVC 1.5 cm  and collapsing (02/09/2023)     Risk Assessment/Calculations:            Physical Exam:   VS:  BP (!) 102/58   Pulse 83   Ht 5\' 10"  (1.778 m)   Wt 178 lb (80.7 kg)   SpO2 92%   BMI 25.54 kg/m    Wt Readings from Last 3 Encounters:  02/09/23 178 lb (80.7 kg)  01/21/23 165 lb (74.8 kg)  01/18/23 176 lb (79.8 kg)    GEN:  Elderly, short of breath NECK: No JVD; No carotid bruits CARDIAC: RRR, soft systolic murmur, no rubs, no gallops RESPIRATORY:  Mildly decreased at bases. Increased work of breathing, tripoding sitting up.   ABDOMEN: Soft, non-tender, non-distended EXTREMITIES:  3-4+ BLE edema; No deformity   ASSESSMENT AND PLAN: .    Assessment and Plan    Acute on Chronic Systolic Heart Failure Exacerbation Acute exacerbation of chronic systolic heart failure secondary to ischemic cardiomyopathy. Presents with significantly worsened dyspnea over the past week, NYHA class IV heart failure, and 3-4+ pedal edema. Recent echocardiogram shows ejection fraction ~20%, mild LVH, mildly dilated left ventricular cavity, and moderate pulmonary pressures. Unable to afford Entresto or Jardiance. Prior syncopal episode with losartan. Discussed risks of IV furosemide including potential renal impairment and the need for close monitoring. Benefits include rapid symptom relief and reduction in fluid overload. Alternatives such as oral diuretics were considered but deemed less effective in this acute setting. - EMS, hospital transport, admit. ER. Notify cardiology team.  - Administer IV furosemide 80 mg IV bid - Monitor renal function closely - Obtain chest x-ray  Coronary Artery Disease Severe three-vessel coronary artery disease with multiple interventions including CABG (1996), PCI to saphenous vein graft to OM2 (2016), and TAVR (2018). Most recent cardiac catheterization (Dec 2022) showed severe three-vessel disease with occluded saphenous vein grafts and a 95% lesion in sequential saphenous vein graft to PDA and posterior lateral. Patent LIMA to LAD. Recent PCI to vein graft to posterior lateral branch or PDA. Discussed the importance of continuing current medications to manage symptoms and prevent further cardiac events. - Continue current medications: atorvastatin 80 mg daily, Eliquis 5 mg bid, isosorbide 120 mg daily,  Toprol XL 50 mg daily, Ranexa 500 mg bid, spironolactone 12.5 mg daily, Plavix 75 mg daily  Paroxysmal Atrial Fibrillation Paroxysmal atrial fibrillation managed with Eliquis 5 mg bid. Discussed the importance of anticoagulation to prevent thromboembolic events. - Continue Eliquis 5 mg bid - Currently sinus rhythm, ICD - BI-V noted AV paced.   Hyperlipidemia Hyperlipidemia managed with atorvastatin 80 mg daily. Most recent LDL was 88, with a goal of <70. Discussed the importance of achieving LDL goal to reduce cardiovascular risk. - Continue atorvastatin 80 mg daily  General Health Maintenance Prior smoker. No recent signs of pulmonary edema on chest x-ray (2022). Scheduled for a PET stress test as an outpatient. - Perform PET stress test as scheduled  Follow-up - Admit to hospital for management of acute on chronic systolic heart failure exacerbation.               Signed, Donato Schultz, MD

## 2023-02-09 NOTE — ED Notes (Signed)
Full linen and gown change performed at this time, warm blankets provided.

## 2023-02-09 NOTE — ED Provider Triage Note (Signed)
Emergency Medicine Provider Triage Evaluation Note  [name] , a 78 y.o. male  was evaluated in triage.  Pt complains of worsening symptoms of heart failure.  Patient was stent from the heart failure clinic for CHF exacerbation.  He has had increased bilateral lower extremity edema for the past week and endorses increased shortness of breath.  He has felt chills, nausea and diarrhea.  He states that his EF was 20% today on his echocardiogram.  He had been doubling his Lasix dose at home that improvement.  He has a new oxygen requirement. He denies chest pain or diaphoresis.  Review of Systems  Positive: SOB, chills, Nausea Negative: Abdominal pain, chest pain  Physical Exam  There were no vitals taken for this visit. Gen:   Awake, no distress   Resp:  Normal effort, bibasilar rale MSK:   Moves extremities without difficulty    Medical Decision Making  Medically screening exam initiated at 9:57 AM.  Appropriate orders placed.  [name] was informed that the remainder of the evaluation will be completed by another provider, this initial triage assessment does not replace that evaluation, and the importance of remaining in the ED until their evaluation is complete.  Workup Initiated for heart failure exacerbation, patient with new worsening EF.  On oxygen in triage, administered Lasix and potassium. Labs obtained.   Preston Avena, MD 02/09/23 [phone]

## 2023-02-10 ENCOUNTER — Inpatient Hospital Stay (HOSPITAL_COMMUNITY): Payer: Medicare HMO

## 2023-02-10 ENCOUNTER — Other Ambulatory Visit: Payer: Self-pay

## 2023-02-10 ENCOUNTER — Other Ambulatory Visit (HOSPITAL_COMMUNITY): Payer: Self-pay

## 2023-02-10 ENCOUNTER — Telehealth (HOSPITAL_COMMUNITY): Payer: Self-pay | Admitting: Pharmacy Technician

## 2023-02-10 ENCOUNTER — Telehealth: Payer: Self-pay | Admitting: Cardiology

## 2023-02-10 DIAGNOSIS — R570 Cardiogenic shock: Secondary | ICD-10-CM

## 2023-02-10 DIAGNOSIS — J9601 Acute respiratory failure with hypoxia: Secondary | ICD-10-CM

## 2023-02-10 DIAGNOSIS — E872 Acidosis, unspecified: Secondary | ICD-10-CM | POA: Diagnosis not present

## 2023-02-10 DIAGNOSIS — I5082 Biventricular heart failure: Secondary | ICD-10-CM

## 2023-02-10 DIAGNOSIS — K219 Gastro-esophageal reflux disease without esophagitis: Secondary | ICD-10-CM

## 2023-02-10 DIAGNOSIS — R579 Shock, unspecified: Secondary | ICD-10-CM | POA: Diagnosis not present

## 2023-02-10 DIAGNOSIS — J09X9 Influenza due to identified novel influenza A virus with other manifestations: Secondary | ICD-10-CM

## 2023-02-10 DIAGNOSIS — I255 Ischemic cardiomyopathy: Secondary | ICD-10-CM

## 2023-02-10 DIAGNOSIS — N179 Acute kidney failure, unspecified: Secondary | ICD-10-CM | POA: Diagnosis not present

## 2023-02-10 DIAGNOSIS — I5021 Acute systolic (congestive) heart failure: Secondary | ICD-10-CM | POA: Diagnosis not present

## 2023-02-10 DIAGNOSIS — I480 Paroxysmal atrial fibrillation: Secondary | ICD-10-CM

## 2023-02-10 LAB — LACTIC ACID, PLASMA
Lactic Acid, Venous: 2.8 mmol/L (ref 0.5–1.9)
Lactic Acid, Venous: 2.8 mmol/L (ref 0.5–1.9)
Lactic Acid, Venous: 5.9 mmol/L (ref 0.5–1.9)

## 2023-02-10 LAB — CBC
HCT: 37.1 % — ABNORMAL LOW (ref 39.0–52.0)
Hemoglobin: 11.9 g/dL — ABNORMAL LOW (ref 13.0–17.0)
MCH: 28.7 pg (ref 26.0–34.0)
MCHC: 32.1 g/dL (ref 30.0–36.0)
MCV: 89.6 fL (ref 80.0–100.0)
Platelets: 195 10*3/uL (ref 150–400)
RBC: 4.14 MIL/uL — ABNORMAL LOW (ref 4.22–5.81)
RDW: 15.9 % — ABNORMAL HIGH (ref 11.5–15.5)
WBC: 8.1 10*3/uL (ref 4.0–10.5)
nRBC: 0.6 % — ABNORMAL HIGH (ref 0.0–0.2)

## 2023-02-10 LAB — BASIC METABOLIC PANEL
Anion gap: 11 (ref 5–15)
BUN: 50 mg/dL — ABNORMAL HIGH (ref 8–23)
CO2: 25 mmol/L (ref 22–32)
Calcium: 8.8 mg/dL — ABNORMAL LOW (ref 8.9–10.3)
Chloride: 98 mmol/L (ref 98–111)
Creatinine, Ser: 2.43 mg/dL — ABNORMAL HIGH (ref 0.61–1.24)
GFR, Estimated: 26 mL/min — ABNORMAL LOW (ref >60)
Glucose, Bld: 114 mg/dL — ABNORMAL HIGH (ref 70–99)
Potassium: 3.8 mmol/L (ref 3.5–5.1)
Sodium: 134 mmol/L — ABNORMAL LOW (ref 135–145)

## 2023-02-10 LAB — RESPIRATORY PANEL BY PCR

## 2023-02-10 LAB — URINALYSIS, ROUTINE W REFLEX MICROSCOPIC
Bilirubin Urine: NEGATIVE
Glucose, UA: NEGATIVE mg/dL
Ketones, ur: NEGATIVE mg/dL
Leukocytes,Ua: NEGATIVE
Nitrite: NEGATIVE
Protein, ur: NEGATIVE mg/dL
Specific Gravity, Urine: 1.009 (ref 1.005–1.030)
pH: 5 (ref 5.0–8.0)

## 2023-02-10 LAB — GLUCOSE, CAPILLARY: Glucose-Capillary: 84 mg/dL (ref 70–99)

## 2023-02-10 LAB — COOXEMETRY PANEL
Carboxyhemoglobin: 2 % — ABNORMAL HIGH (ref 0.5–1.5)
Methemoglobin: 0.9 % (ref 0.0–1.5)
O2 Saturation: 66.9 %
Total hemoglobin: 11.1 g/dL — ABNORMAL LOW (ref 12.0–16.0)

## 2023-02-10 LAB — BRAIN NATRIURETIC PEPTIDE: B Natriuretic Peptide: 1804.8 pg/mL — ABNORMAL HIGH (ref 0.0–100.0)

## 2023-02-10 LAB — CG4 I-STAT (LACTIC ACID): Lactic Acid, Venous: 1.7 mmol/L (ref 0.5–1.9)

## 2023-02-10 LAB — MRSA NEXT GEN BY PCR, NASAL: MRSA by PCR Next Gen: NOT DETECTED

## 2023-02-10 LAB — PROCALCITONIN: Procalcitonin: 0.23 ng/mL

## 2023-02-10 MED ORDER — CHLORHEXIDINE GLUCONATE CLOTH 2 % EX PADS
6.0000 | MEDICATED_PAD | Freq: Every day | CUTANEOUS | Status: DC
  Administered 2023-02-10 – 2023-02-16 (×7): 6 via TOPICAL

## 2023-02-10 MED ORDER — VANCOMYCIN VARIABLE DOSE PER UNSTABLE RENAL FUNCTION (PHARMACIST DOSING): Status: DC

## 2023-02-10 MED ORDER — SODIUM CHLORIDE 0.9 % IV SOLN
2.0000 g | Freq: Two times a day (BID) | INTRAVENOUS | Status: DC
  Administered 2023-02-10 – 2023-02-12 (×4): 2 g via INTRAVENOUS
  Filled 2023-02-10 (×4): qty 12.5

## 2023-02-10 MED ORDER — SODIUM CHLORIDE 0.9% FLUSH: 10.0000 mL | INTRAVENOUS | Status: DC | PRN

## 2023-02-10 MED ORDER — FUROSEMIDE 10 MG/ML IJ SOLN
10.0000 mg/h | INTRAVENOUS | Status: DC
  Administered 2023-02-10 – 2023-02-12 (×3): 10 mg/h via INTRAVENOUS
  Filled 2023-02-10 (×3): qty 20

## 2023-02-10 MED ORDER — NOREPINEPHRINE 4 MG/250ML-% IV SOLN
5.0000 ug/min | INTRAVENOUS | Status: DC
  Administered 2023-02-10 – 2023-02-12 (×4): 5 ug/min via INTRAVENOUS
  Filled 2023-02-10 (×4): qty 250

## 2023-02-10 MED ORDER — MILRINONE LACTATE IN DEXTROSE 20-5 MG/100ML-% IV SOLN
0.2500 ug/kg/min | INTRAVENOUS | Status: DC
  Administered 2023-02-10 – 2023-02-13 (×5): 0.25 ug/kg/min via INTRAVENOUS
  Filled 2023-02-10 (×5): qty 100

## 2023-02-10 MED ORDER — SODIUM CHLORIDE 0.9% FLUSH
10.0000 mL | Freq: Two times a day (BID) | INTRAVENOUS | Status: DC
  Administered 2023-02-10 – 2023-02-11 (×3): 10 mL
  Administered 2023-02-12: 20 mL
  Administered 2023-02-12 – 2023-02-16 (×8): 10 mL

## 2023-02-10 MED ORDER — VANCOMYCIN HCL 1250 MG/250ML IV SOLN
1250.0000 mg | Freq: Once | INTRAVENOUS | Status: AC
  Administered 2023-02-10: 1250 mg via INTRAVENOUS
  Filled 2023-02-10: qty 250

## 2023-02-10 MED ORDER — OSELTAMIVIR PHOSPHATE 30 MG PO CAPS
30.0000 mg | ORAL_CAPSULE | Freq: Every day | ORAL | Status: DC
  Administered 2023-02-10 – 2023-02-12 (×3): 30 mg via ORAL
  Filled 2023-02-10 (×3): qty 1

## 2023-02-10 NOTE — Progress Notes (Addendum)
   Heart Failure Stewardship Pharmacist Progress Note   PCP: Corrie Mckusick, MD PCP-Cardiologist: Peter Swaziland, MD    HPI:  78 yo M with PMH CAD, chronic systolic heart failure with Bi V ICD, s/p CABG in 1996, s/p PCI of saphenous vein graft to OM2 in 2016 and to PDA in 2022, s/p TAVR in 2018, paroxysmal afib, CKD IIIa, hypertension, hyperlipidemia. Last ECHO done in 2023 showed EF ~40%, G3DD, mild RV enlargement, mild to moderate MR.   On 1/28 patient presented for outpatient ECHO with worsening dyspnea and work of breathing. Updated ECHO with EF now <20%, RWMA, LV severely dilated, mild LVH, elevated LVEDP, flattened interventricular septum, RV function mildly reduced, mildly elevated PASP, severely dilated LA and moderately dilated RA, moderate MR, moderate to severe TR. Presentation consistent with acute on chronic systolic HF exacerbation and he was sent to the ED for further evaluation and treatment. Noted to have AKI on CKD with Cr 2.43 (baseline ~1.5) and elevated LFTs likely due to hepatic congestion. Of note, patient has not been able to afford Entresto or Jardiance. Has been denied from Jardiance PAP. Also had a syncopal episode with losartan previously.   On 1/29 still volume up with poor output on furosmide 80 mg IV and he was transitioned to lasix continuous infusion and metoprolol held. Concern for cardiogenic shock, plan to start milrinone. Also positive for influenza A and started on Tamiflu.  Attempted to go see patient. PICC line placement was in process. Unable to complete interview at this time.  Current HF Medications: Diuretic: furosemide 10 mg/hr MRA: spironolactone 12.5 mg daily Other: Imdur 120 mg daily  Prior to admission HF Medications: Diuretic: furosemide 40 mg daily Beta blocker: metoprolol XL 50 mg daily MRA: spironolactone 12.5 mg daily Other: Imdur 120 mg daily  Pertinent Lab Values: Serum creatinine 2.43, BUN 50, Potassium 3.8, Sodium 134, BNP 1804,  Magnesium 2.1  Vital Signs: Weight: 175 lbs (admission weight: 175 lbs) Blood pressure: 100-110s/60s  Heart rate: 60s  I/O: net - net -+0.1L since admission  Medication Assistance / Insurance Benefits Check: Does the patient have prescription insurance?  Yes Type of insurance plan: [employer] Medicare  Does the patient qualify for medication assistance through manufacturers or grants?   Yes Eligible grants and/or patient assistance programs: [employer] grant  Outpatient Pharmacy:  Prior to admission outpatient pharmacy: [facility] Is the patient willing to use [facility] TOC pharmacy at discharge? Pending Is the patient willing to transition their outpatient pharmacy to utilize a [facility] outpatient pharmacy?   Pending    Assessment/Plan: 1. Acute on chronic systolic CHF (LVEF <20%), due to ICM. NYHA class IV symptoms. - Advanced HF consulted, will sign off at this time    2) Patient assistance: - Patient has a $250 deductible. Marcelline Deist $66 and Jardiance $274 with his insurance. Patient would qualify for [employer] grant to reduce cost of SGLT-2 to $0.  3)  Education  - To be completed prior to discharge  Jarrett Ables, PharmD PGY-1 Pharmacy Resident  Sharen Hones, PharmD, BCPS Heart Failure Stewardship Pharmacist Phone [phone]

## 2023-02-10 NOTE — Telephone Encounter (Signed)
Patient Product/process development scientist completed.    The patient is insured through [employer]. Patient has Medicare and is not eligible for a copay card, but may be able to apply for patient assistance or Medicare RX Payment Plan (Patient Must reach out to their plan, if eligible for payment plan), if available.    Ran test claim for Farxiga 10 mg and the current 30 day co-pay is $378.94 due to a $250.00 deductible.  Ran test claim for Jardiance 10 mg and the current 30 day co-pay is $274.50 due to a $250.00 deductible.  Ran test claim for Xarelto 20 mg and Not Covered due to "Prosthetic Heart Valve Present"   This test claim was processed through [employer]- copay amounts may vary at other pharmacies due to [employer], or as the patient moves through the different stages of their insurance plan.     [name], CPHT Pharmacy Technician III Certified Patient Advocate [facility] Pharmacy Patient Advocate Team Direct Number: [phone]  Fax: [phone]

## 2023-02-10 NOTE — Telephone Encounter (Signed)
Called and spoke to patient's wife. Verified patient's name and DOB. She is calling to make Dr Swaziland aware patient was sent to the ER yesterday. He was having a stress test and they sent him from there to the ED. Patient is currently admitted. She is concerned because patient has not had anything to eat. Advised her that sometimes patients are not fed due to not knowing if the patient may have to have a procedure or surgery. She also asked if Dr Swaziland was going to the hospital and see patient. I informed her that there are cardiologist in the hospital that will make rounds and decide the best treatment for patient. She was very Adult nurse.

## 2023-02-10 NOTE — Progress Notes (Signed)
Received call from RN. Pt appears more confused. Lactic acid not clearing despite addition of milrinone, 2.8>>2.8. He is warm on exam. Now w/ low grade temp 100 and altered. Cuff BPs currently stable, cuff MAP 68.   Suspect combined cardiogenic + septic shock.  Check CBC, PCT, BCx and UA. Start empiric abx after cultures obtained. May need to move to ICU. Dr. Elwyn Lade notified.  Robbie Lis, PA-C

## 2023-02-10 NOTE — Consult Note (Addendum)
Advanced Heart Failure Team Consult Note   Primary Physician: Corrie Mckusick, MD Cardiologist:  Peter Swaziland, MD EP: Dr. Ladona Ridgel   Reason for Consultation: acute on chronic systolic heart failure w/ low output   HPI:    [name] is seen today for evaluation of acute on chronic systolic heart failure w/ low output, at the request of Dr. Swaziland, Cardiology.   78 y/o male w/ CAD s/p CABG in 1996 and subsequent stenting, LBBB, chronic systolic CHF s/p BiV ICD, low gradient, low flow severe AS s/p TAVR (02/2016). EF has been as low as 30-35% in the past. Echo in 2019 EF improved to 40-45%.  Echo in 2023 EF 50-55%, RV mildly reduced, mild LVH, GIIIDD (restrictive) w/ stable TAVR prosthesis w/ trivial perivalvular leak.   He has now been admitted w/ acute CHF w/ marked volume overload and NYHA Class IIIb symptoms. Was in his usual state of health until ~1 wk ago. Prior to that, hr reports having frequent nitrate responsive CP but he reports this has been his baseline for "years". ~1 wk ago developed worsening dyspnea and LEE. Was seen in cardiology clinic yesterday and given symptoms and degree of volume overload, was directed admitted for further management.   Admission labs were c/w low output, SCr elevated at 2.6 (b/l ~1.7), LFTs also elevated AST 997, ALT 719, BNP 1800. EKG A sensed, V-paced in the 80s. Also + for Influenza A.  Today, he was transitioned to Lasix gtt by cardiology team, currently at 10 mg/hr. He is starting to diurese and markedly volume overloaded. Tele w/? Underlying Afib. V-rates in the 80s.   Echo done today EF <20%, mild LVH, IVS flattened in systolic and diastole c/w RV pressure overload, RV severely enlarged w/ mildly reduced systolic fx, severe LAE w/ mod MR, mod-severe TR. Stable TAVR prosthesis, mean G 9 mmHg, trivial AI.   Lactic acid just resulted and elevated at 2.8. BPs 100s systolic.   Echo 02/10/23  1. Left ventricular ejection fraction, by  estimation, is <20%. The left  ventricle has severely decreased function. The left ventricle demonstrates  regional wall motion abnormalities (see scoring diagram/findings for  description). The left ventricular  internal cavity size was severely dilated. There is mild left ventricular  hypertrophy. Left ventricular diastolic function could not be evaluated.  Elevated left ventricular end-diastolic pressure. There is the  interventricular septum is flattened in  systole and diastole, consistent with right ventricular pressure and  volume overload.   2. Right ventricular systolic function is mildly reduced. The right  ventricular size is severely enlarged. There is mildly elevated pulmonary  artery systolic pressure.   3. Left atrial size was severely dilated.   4. Right atrial size was moderately dilated.   5. The mitral valve is normal in structure. Moderate mitral valve  regurgitation. No evidence of mitral stenosis.   6. Tricuspid valve regurgitation is moderate to severe.   7. The aortic valve has been repaired/replaced. Aortic valve  regurgitation is trivial. There is a 26 mm Sapien prosthetic (TAVR) valve  present in the aortic position. Procedure Date: 02/18/2016. Echo findings  are consistent with perivalvular leak of the   aortic prosthesis.    Home Medications Prior to Admission medications   Medication Sig Start Date End Date Taking? Authorizing Provider  Ascorbic Acid (VITAMIN C) 500 MG CAPS Take 500 mg by mouth in the morning.   Yes [provider]  atorvastatin (LIPITOR) 80 MG tablet TAKE 1  TABLET BY MOUTH DAILY Patient taking differently: Take 80 mg by mouth in the morning. 03/23/22  Yes Swaziland, [name], MD  Cholecalciferol (VITAMIN D-3) 5000 units TABS Take 5,000 Units by mouth in the morning.   Yes [provider]  clopidogrel (PLAVIX) 75 MG tablet Take 1 tablet (75 mg total) by mouth daily. Patient taking differently: Take 75 mg by mouth in the  morning. 11/30/22  Yes [place], [name], MD  ezetimibe (ZETIA) 10 MG tablet Take 1 tablet (10 mg total) by mouth daily. Patient taking differently: Take 10 mg by mouth in the morning. 07/22/22 02/09/23 Yes [place], [name], MD  finasteride (PROSCAR) 5 MG tablet Take 5 mg by mouth in the morning.   Yes [provider]  furosemide (LASIX) 40 MG tablet Take 1 tablet (40 mg total) by mouth daily. Patient taking differently: Take 40 mg by mouth in the morning. 02/08/23  Yes [place], [name], MD  gabapentin (NEURONTIN) 300 MG capsule Take 1 capsule (300 mg total) by mouth 2 (two) times daily with breakfast and lunch AND 2 capsules (600 mg total) at bedtime. Patient taking differently: Take 1 capsule (300 mg total) by mouth 2 (two) times daily. 01/21/23 04/21/23 Yes McDonald, Rachelle Hora, DPM  ibuprofen (ADVIL) 200 MG tablet Take 400 mg by mouth every 6 (six) hours as needed for moderate pain (pain score 4-6) or mild pain (pain score 1-3).   Yes [provider]  isosorbide mononitrate (IMDUR) 120 MG 24 hr tablet TAKE 1 TABLET BY MOUTH DAILY Patient taking differently: Take 120 mg by mouth in the morning. 03/23/22  Yes [place]az[place], [name], MD  metoprolol succinate (TOPROL-XL) 50 MG 24 hr tablet TAKE 1 TABLET BY MOUTH DAILY WITH OR IMEDIATELY FOLLOWING A MEAL Patient taking differently: Take 50 mg by mouth in the morning. TAKE 1 TABLET BY MOUTH DAILY WITH OR IMEDIATELY FOLLOWING A MEAL 09/22/22  Yes Marinus Maw, MD  nitroGLYCERIN (NITROSTAT) 0.4 MG SL tablet Place 1 tablet (0.4 mg total) under the tongue every 5 (five) minutes as needed. DISSOLVE 1 TAB UNDER TONGUE FOR CHEST PAIN - IF PAIN REMAINS AFTER 5 MIN, CALL 911 AND REPEAT DOSE. MAX 3 TABS IN 15 MINUTES. 01/18/23  Yes Lewayne Bunting, MD  oxybutynin (DITROPAN) 5 MG tablet Take 5 mg by mouth in the morning. 07/04/12  Yes [provider]  pantoprazole (PROTONIX) 40 MG tablet TAKE 1 TABLET BY MOUTH DAILY Patient taking differently: Take 40 mg by  mouth in the morning. 08/10/22  Yes Swazil[place], [name], MD  ranolazine (RANEXA) 500 MG 12 hr tablet TAKE 1 TABLET BY MOUTH TWICE A DAY 01/14/23  Yes Swazil[place], [name], MD  spironolactone (ALDACTONE) 25 MG tablet Take 0.5 tablets (12.5 mg total) by mouth daily. Patient taking differently: Take 12.5 mg by mouth in the morning. 11/17/22  Yes Swaz[place], [name], MD  valACYclovir (VALTREX) 500 MG tablet TAKE ONE TABLET BY MOUTH DAILY AS NEEDED FOR OUTBREAK Patient taking differently: Take 500 mg by mouth daily as needed (Outbreaks). 06/13/19  Yes [place], [name], MD  vitamin B-12 (CYANOCOBALAMIN) 500 MCG tablet Take 500 mcg by mouth in the morning.   Yes [provider]    Past Medical History: Past Medical History:  Diagnosis Date   Aortic stenosis    Barrett's esophageal ulceration    Chronic systolic CHF (congestive heart failure) (HCC)    Coronary artery disease    a. CABG 1996: LIMA graft to the  LAD and diagonal, sequential vein graft to the acute marginal, PDA, and posterior lateral branches of the right coronary, sequential saphenous vein graft to the obtuse marginal vessel and distal circumflex. b. Cath 2007 patent grafts. c. s/p DES to SVG-OM2 in 03/2014.   Coronary artery disease involving autologous vein coronary bypass graft with angina pectoris (HCC) 03/13/2014   PCI using DES in SVG to LCx system   Dental caries    pre-heart valve surgery protocol   Gastroesophageal reflux disease    History of erectile dysfunction    History of hiatal hernia    History of kidney stones    History of obesity    History of prostatitis    Hypercholesterolemia    Hypertension    Kidney stones    LBBB (left bundle branch block)    Nodule of kidney 02/04/2016   Incidental 8mm nodule upper pole right kidney noted on CT angiogram - MRI with and without gadolinium contrast recommened in 6 months   OSA (obstructive sleep apnea)    "suppose to wear mask; I don't" (03/13/2014)   PVC's (premature ventricular  contractions)    S/P CABG x 7 01/07/1995   LIMA to LAD-diagonal, SVG to OM-LCx, SVG to AM-PD-RPL   S/P TAVR (transcatheter aortic valve replacement) 02/18/2016   26 mm Edwards Sapien 3 transcatheter heart valve placed via percutaneous right transfemoral approach    Past Surgical History: Past Surgical History:  Procedure Laterality Date   CARDIAC CATHETERIZATION  1996; ~ 2006   ; Ejection fraction is estimated at 45%   CARDIAC CATHETERIZATION N/A 01/22/2016   Procedure: Right/Left Heart Cath and Coronary/Graft Angiography;  Surgeon: [name] Swaziland, MD;  Location: [facility] INVASIVE CV LAB;  Service: Cardiovascular;  Laterality: N/A;   CHOLECYSTECTOMY N/A 01/23/2014   Procedure: LAPAROSCOPIC CHOLECYSTECTOMY WITH INTRAOPERATIVE CHOLANGIOGRAM ;  Surgeon: Claud Kelp, MD;  Location: [facility] OR;  Service: General;  Laterality: N/A;   COLONOSCOPY W/ POLYPECTOMY     CORONARY ANGIOPLASTY WITH STENT PLACEMENT  03/13/2014   "1"   CORONARY ARTERY BYPASS GRAFT  1996   "CABG X 7"   CORONARY STENT INTERVENTION N/A 12/26/2020   Procedure: CORONARY STENT INTERVENTION;  Surgeon: Swaziland, [name], MD;  Location: [facility] INVASIVE CV LAB;  Service: Cardiovascular;  Laterality: N/A;   CYSTOSCOPY WITH STENT PLACEMENT     EP IMPLANTABLE DEVICE N/A 12/17/2014   Procedure: BiV ICD Insertion CRT-D;  Surgeon: Marinus Maw, MD;  Location: [facility] INVASIVE CV LAB;  Service: Cardiovascular;  Laterality: N/A;   ICD GENERATOR CHANGEOUT N/A 06/02/2022   Procedure: ICD GENERATOR CHANGEOUT;  Surgeon: Marinus Maw, MD;  Location: [facility] INVASIVE CV LAB;  Service: Cardiovascular;  Laterality: N/A;   LEFT HEART CATH AND CORS/GRAFTS ANGIOGRAPHY N/A 12/26/2020   Procedure: LEFT HEART CATH AND CORS/GRAFTS ANGIOGRAPHY;  Surgeon: Swaziland, [name], MD;  Location: [facility] INVASIVE CV LAB;  Service: Cardiovascular;  Laterality: N/A;   LEFT HEART CATHETERIZATION WITH CORONARY/GRAFT ANGIOGRAM N/A 03/13/2014   Procedure: LEFT HEART CATHETERIZATION WITH Isabel Caprice;  Surgeon: [name] Swaziland, MD;  Location: [facility] CATH LAB;  Service: Cardiovascular;  Laterality: N/A;   MULTIPLE EXTRACTIONS WITH ALVEOLOPLASTY N/A 02/07/2016   Procedure: Extraction of tooth #'s 1,2,3,14,15, 18, 20, 29,30,31 with alveoloplasty and dental cleaning of teeth.;  Surgeon: Charlynne Pander, DDS;  Location: [facility] OR;  Service: Oral Surgery;  Laterality: N/A;   TEE WITHOUT CARDIOVERSION N/A 02/18/2016   Procedure: TRANSESOPHAGEAL ECHOCARDIOGRAM (TEE);  Surgeon: Tonny Bollman, MD;  Location:  [facility] OR;  Service: Open Heart Surgery;  Laterality: N/A;   TONSILLECTOMY  ~ 1950   TRANSCATHETER AORTIC VALVE REPLACEMENT, TRANSFEMORAL N/A 02/18/2016   Procedure: TRANSCATHETER AORTIC VALVE REPLACEMENT, TRANSFEMORAL;  Surgeon: Tonny Bollman, MD;  Location: [facility] OR;  Service: Open Heart Surgery;  Laterality: N/A;    Family History: Family History  Problem Relation Age of Onset   Heart attack Father    Cancer Sister        Uterine Cancer    Social History: Social History   Socioeconomic History   Marital status: Married    Spouse name: Not on file   Number of children: 3   Years of education: Not on file   Highest education level: Not on file  Occupational History   Occupation: Investment banker, corporate: UNEMPLOYED    Comment: retired  Tobacco Use   Smoking status: Former    Current packs/day: 0.00    Average packs/day: 0.8 packs/day for 15.0 years (11.3 ttl pk-yrs)    Types: Cigarettes    Start date: 01/12/1982    Quit date: 01/12/1997    Years since quitting: 26.0   Smokeless tobacco: Never   Tobacco comments:    3/4 pack per day. smoked 15 years off and on  Vaping Use   Vaping status: Never Used  Substance and Sexual Activity   Alcohol use: No    Alcohol/week: 4.0 standard drinks of alcohol    Types: 4 Cans of beer per week   Drug use: No   Sexual activity: Yes  Other Topics Concern   Not on file  Social History Narrative   Not on file   Social Drivers of Health   Financial  Resource Strain: Not on file  Food Insecurity: No Food Insecurity (02/10/2023)   Hunger Vital Sign    Worried About Running Out of Food in the Last Year: Never true    Ran Out of Food in the Last Year: Never true  Transportation Needs: No Transportation Needs (02/10/2023)   PRAPARE - Administrator, Civil Service (Medical): No    Lack of Transportation (Non-Medical): No  Physical Activity: Not on file  Stress: Not on file  Social Connections: Moderately Isolated (02/10/2023)   Social Connection and Isolation Panel [NHANES]    Frequency of Communication with Friends and Family: Three times a week    Frequency of Social Gatherings with Friends and Family: Never    Attends Religious Services: Never    Database administrator or Organizations: No    Attends Engineer, structural: Never    Marital Status: Married    Allergies:  No Known Allergies  Objective:    Vital Signs:   Temp:  [97.7 F (36.5 C)-98.2 F (36.8 C)] 98 F (36.7 C) (01/29 0709) Pulse Rate:  [59-71] 59 (01/29 0709) Resp:  [16-25] 24 (01/29 0709) BP: (100-130)/(60-86) 108/60 (01/29 0709) SpO2:  [93 %-100 %] 99 % (01/29 0709) Weight:  [79.7 kg] 79.7 kg (01/29 0421) Last BM Date : 02/08/23  Weight change: Filed Weights   02/09/23 1035 02/10/23 0410 02/10/23 0421  Weight: 79.4 kg 79.7 kg 79.7 kg    Intake/Output:   Intake/Output Summary (Last 24 hours) at 02/10/2023 1153 Last data filed at 02/10/2023 1122 Gross per 24 hour  Intake 600 ml  Output 600 ml  Net 0 ml      Physical Exam    General:  fatigued appearing elderly male, coughing. No resp difficulty HEENT:  normal Neck: supple. JVD elevated to jaw . Carotids 2+ bilat; no bruits. No lymphadenopathy or thyromegaly appreciated. Cor: PMI nondisplaced. Regular rate & rhythm. No rubs, gallops or murmurs. Lungs: course bilaterally w/ bibasilar crackles/rhonchi  Abdomen: soft, nontender, nondistended. No hepatosplenomegaly. No bruits or  masses. Good bowel sounds. Extremities: no cyanosis, clubbing, rash, 2-3+ b/l LE pitting edema. Distal ext warm  Neuro: alert & orientedx3, cranial nerves grossly intact. moves all 4 extremities w/o difficulty. Affect pleasant   Telemetry   A-V paced 80s, ? Underlying Afib   EKG    Admit EKG A sensed, V paced 83 bpm w/ PVCs, personally reviewed   Labs   Basic Metabolic Panel: Recent Labs  Lab 02/09/23 1015 02/10/23 0509  NA 133* 134*  K 4.3 3.8  CL 99 98  CO2 21* 25  GLUCOSE 115* 114*  BUN 54* 50*  CREATININE 2.57* 2.43*  CALCIUM 8.9 8.8*  MG 2.1  --     Liver Function Tests: Recent Labs  Lab 02/09/23 1015  AST 997*  ALT 719*  ALKPHOS 77  BILITOT 3.1*  PROT 5.6*  ALBUMIN 3.0*   No results for input(s): "LIPASE", "AMYLASE" in the last 168 hours. No results for input(s): "AMMONIA" in the last 168 hours.  CBC: Recent Labs  Lab 02/09/23 1015  WBC 6.7  NEUTROABS 5.6  HGB 11.3*  HCT 35.4*  MCV 91.7  PLT 185    Cardiac Enzymes: No results for input(s): "CKTOTAL", "CKMB", "CKMBINDEX", "TROPONINI" in the last 168 hours.  BNP: BNP (last 3 results) Recent Labs    02/10/23 0509  BNP 1,804.8*    ProBNP (last 3 results) No results for input(s): "PROBNP" in the last 8760 hours.   CBG: No results for input(s): "GLUCAP" in the last 168 hours.  Coagulation Studies: No results for input(s): "LABPROT", "INR" in the last 72 hours.   Imaging   [place] EKG SITE RITE Result Date: 02/10/2023 If Site Rite image not attached, placement could not be confirmed due to current cardiac rhythm.    Medications:     Current Medications:  apixaban  5 mg Oral BID   atorvastatin  80 mg Oral Daily   clopidogrel  75 mg Oral Daily   ezetimibe  10 mg Oral Daily   finasteride  5 mg Oral Daily   isosorbide mononitrate  120 mg Oral Daily   pantoprazole  40 mg Oral Daily   ranolazine  500 mg Oral BID   sodium chloride flush  3 mL Intravenous Q12H   spironolactone  12.5  mg Oral Daily    Infusions:  sodium chloride     furosemide (LASIX) 200 mg in dextrose 5 % 100 mL (2 mg/mL) infusion 10 mg/hr (02/10/23 1120)   milrinone        Patient Profile   77 y/o male w/ CAD s/p CABG in 1996 and subsequent stenting, LBBB, chronic systolic CHF s/p BiV ICD, low gradient, low flow severe AS s/p TAVR (02/2016). EF has been as low as 30-35% in the past. Echo in 2019 EF improved to 40-45%.  Echo in 2023 EF 50-55%, RV mildly reduced, mild LVH, GIIIDD (restrictive) w/ stable TAVR prosthesis w/ trivial perivalvular leak.   Admitted w/ a/c BiV CHF (EF <20%, RV mod reduced) w/ marked volume overload and low output. Influenza A +   Assessment/Plan   1. Acute Biventricular Systolic Heart Failure w/ Low-output/Shock  - h/o ischemic CM + Known LBBB s/p CRT-D  - EF has been  as low as 30-35% in the past. - Echo 2019 EF improved to 40-45%.   - Echo in 2023 EF 50-55%, RV mildly reduced, mild LVH, GIIIDD (restrictive) - Now admitted w/ NYHA Class IIIb symptoms + low output w/ AKI and shock liver. Initial LA 2.8 - start milrinone 0.25 mcg/kg/min through PIV - Place PICC to follow co-ox and CVPs - follow LA for clearance  - continue lasix gtt at 10/hr and follow response - place unna boots  - stop ? blocker - on Spiro 12.5 mg (watch SCr/K closely)  - continue Imdur 120 mg daily (monitor BP w/ milrinone)  - no additional GDMT currently w/ AKI - Etiology for drop in EF uncertain. ? Ischemic w/ WMAs. Not current candidate for LHC w/ AKI. ? Viral in setting of Flu A. ? Tachy mediated, appears to be in underlying Afib on tele though rates not currently high. Have asked [employer] device rep to interrogate CRT-D device to check afib burden and ensure proper BiV pacing. W/ h/o conduction dz, severe AS and restrictive physiology on prior echos' ? Infiltrative process. Consider amyloid w/u +/- MRI once recovered from shock    2. AKI on CKD IIIb - B/l SCr ~1.7 - SCr 2.6 on admit -  cardiorenal/low output - start inotropic support w/ Milrinone - follow BMP w/ diuresis   3. Shock Liver - 2/2 cardiogenic Shock  - AST 997 - ALT 719 - start milrinone and continue Lasix  - follow CMP   4. Influenza A - not vaccinated - start Tamiflu   5. PAF - appears to be in rate controlled Afib - device interrogation to check burden  - on Eliquis  - low threshold to start amio gtt while on milrinone   6. CAD - s/p remote CABG in 1996 - last cath 2022: patent LIMA-LAD, occluded SVG-OM3 w/ collaterals, 95% pSVG-PDA treated w/ DES  - has frequent nitrate responsive CP + WMA on echo  - continue Plavix - no ASA given need for Eliquis  - Imdur 120 mg + ranexa 500 bid - atorva + Zetia  - cath if renal fx improves   7. H/o Severe AS - s/p TAVR in 2018 - stable on admit echo, MG 9 mmHg. Trivial AI    Length of Stay: 1  Brittainy Simmons, PA-C  02/10/2023, 11:53 AM  Advanced Heart Failure Team Pager [phone] (M-F; 7a - 5p)  Please contact [facility] for night-coverage after hours (4p -7a ) and weekends on [email or web address]   Patient seen and examined with the above-signed Advanced Practice Provider and/or Housestaff. I personally reviewed laboratory data, imaging studies and relevant notes. I independently examined the patient and formulated the important aspects of the plan. I have edited the note to reflect any of my changes or salient points. I have personally discussed the plan with the patient and/or family.  78 y/o male with h/o CAD s/p CABG, chronic systolic HF with mildly reduced EF.   Admitted with Influenza A.   Developed progressive SOB and volume overload. Echo EF < 20% Serum creatinine now going up. Lactic acid 2.5  General:  Ill-appearing. + SOB HEENT: normal Neck: supple. JVP jaw  Carotids 2+ bilat; no bruits. No lymphadenopathy or thryomegaly appreciated. Cor: Regular rate & rhythm. No rubs, gallops or murmurs. Lungs: rhonchorous Abdomen: soft,  nontender, nondistended. No hepatosplenomegaly. No bruits or masses. Good bowel sounds. Extremities: no cyanosis, clubbing, rash,2-3+ edema cool Neuro: alert & orientedx3, cranial nerves grossly intact. moves all 4  extremities w/o difficulty. Affect pleasant  Suspect EF down due to flu. Now with low output HF and lactic acidosis. Will start milrinone ASAp (d/w RN) and continue IV lasix. Place PICC to follow CVP & co-ox.   Low threshold to move to ICU as needed.   Arvilla Meres, MD  3:44 PM

## 2023-02-10 NOTE — Progress Notes (Addendum)
Patient with soiled adult depend upon assessment, pericare provided. Male external catheter applied for accuracy of diuresis. Patient expresses appreciation. Will continue to monitor.

## 2023-02-10 NOTE — Progress Notes (Signed)
Peripherally Inserted Central Catheter Placement  The IV Nurse has discussed with the patient and/or persons authorized to consent for the patient, the purpose of this procedure and the potential benefits and risks involved with this procedure.  The benefits include less needle sticks, lab draws from the catheter, and the patient may be discharged home with the catheter. Risks include, but not limited to, infection, bleeding, blood clot (thrombus formation), and puncture of an artery; nerve damage and irregular heartbeat and possibility to perform a PICC exchange if needed/ordered by physician.  Alternatives to this procedure were also discussed.  Bard Power PICC patient education guide, fact sheet on infection prevention and patient information card has been provided to patient /or left at bedside.    PICC Placement Documentation  PICC Double Lumen 02/10/23 Right Basilic 39 cm 0 cm (Active)  Indication for Insertion or Continuance of Line Prolonged intravenous therapies 02/10/23 1600  Exposed Catheter (cm) 0 cm 02/10/23 1600  Site Assessment Clean, Dry, Intact 02/10/23 1600  Lumen #1 Status Flushed;Saline locked;Blood return noted 02/10/23 1600  Lumen #2 Status Flushed;Saline locked;Blood return noted 02/10/23 1600  Dressing Type Transparent;Securing device 02/10/23 1600  Dressing Status Antimicrobial disc/dressing in place 02/10/23 1600  Line Care Connections checked and tightened 02/10/23 1600  Line Adjustment (NICU/IV Team Only) No 02/10/23 1600  Dressing Intervention New dressing;Adhesive placed at insertion site (IV team only) 02/10/23 1600  Dressing Change Due 02/17/23 02/10/23 1600       Vernona Rieger  Samaa Ueda 02/10/2023, 5:06 PM

## 2023-02-10 NOTE — Progress Notes (Signed)
Heart Failure Navigator Progress Note  Assessed for Heart & Vascular TOC clinic readiness.  Patient does not meet criteria due to Advanced Heart Failure Team consulted. .   Navigator will sign off at this time.   Rhae Hammock, BSN, Scientist, clinical (histocompatibility and immunogenetics) Only

## 2023-02-10 NOTE — Progress Notes (Addendum)
Patient with intermittent confusion during found and tearful, wasn't unable to express place/time/situation, VS stabled. HF team notified of acute change.   Per IV team team, PICC will need to be Xray before use.

## 2023-02-10 NOTE — Progress Notes (Signed)
L PICC line removed due to radiology report stating inability to identify tip placement. New PICC line placed on the right arm with success. ECG verification noted.

## 2023-02-10 NOTE — Telephone Encounter (Signed)
Spoke to patient's wife after being transferred from the call center. She is calling back to get information concerning patient. She has been trying to reach patient in his room and is unable to. She also stated patient told her that he was told he has the flu and she doesn't believe that because he's had these symptoms for a while. I did chest his labs and advised her that his flu A was positive. Advised her to reach out to nurse and see if patient has a case worker that could possible keep her informed. Verbalized understanding and agree.

## 2023-02-10 NOTE — Consult Note (Signed)
NAME:  [name], MRN:  [id number], DOB:  [birth year], LOS: 1 ADMISSION DATE:  02/09/2023 CONSULTATION DATE:  02/10/2023 REFERRING MD:  Anne Fu - Cardiology, CHIEF COMPLAINT: Shock, cardiogenic versus septic   History of Present Illness:  78 year old man who presented to [facility] 1/28 for SOB and leg swelling. PMHx significant for HTN, HLD, CAD (s/p CABG x 7 1996, PCI with DES to SVG-OM2 03/2014, 12/2020) with ischemic CM (s/p BiV-ICD), AS s/p TAVR (2018), HFrEF (Echo 03/2021 with EF ~40% with G3DD), PAF (on Eliquis), LBBB, OSA (not on CPAP), GERD c/b Barrett's esophagus, nephrolithiasis.  History is obtained primarily from chart review. Patient initially contacted his Cardiology office 1/27 complaining of a few days of SOB (both at rest/with activity) and leg swelling; reported weight gain of 5lb over the few days PTA. Additionally, c/o new onset CP and "tightness" with dyspnea. Patient was advised to increase Lasix dose and to call back if no improvement in symptoms. He presented to Cardiology clinic 1/28 with ongoing CP/SOB, significant LE edema and orthopnea. Denied fever/chills, endorsed cough. Denies nausea/vomiting, endorses some diarrhea.   Subsequently was sent from Cardiology clinic to [facility] ED for presumed heart failure exacerbation. On ED arrival, patient was afebrile with HR 88 (AV paced), BP 112/68, RR 24, SpO2 93% on 2L). Noted to be somewhat confused. Labs were notable for BNP 1804. LA 2.8 > 2.8, Flu A+. UA +moderate Hgb, few bacteria. CXR with mild cardiomegaly. Echo completed demonstrating EF  < 20%, +RWMAs, TAVR with perivalvular leak. Admitted under the Cardiology service. AHF consulted 1/29.   On 1/29, patient was noted to be acutely more confused/tearful with plateaued LA and low grade fever with concern for development of shock. PCCM consulted for ICU evaluation and transfer.  Pertinent Medical History:   Past Medical History:  Diagnosis Date   Aortic stenosis    Barrett's  esophageal ulceration    Chronic systolic CHF (congestive heart failure) (HCC)    Coronary artery disease    a. CABG 1996: LIMA graft to the LAD and diagonal, sequential vein graft to the acute marginal, PDA, and posterior lateral branches of the right coronary, sequential saphenous vein graft to the obtuse marginal vessel and distal circumflex. b. Cath 2007 patent grafts. c. s/p DES to SVG-OM2 in 03/2014.   Coronary artery disease involving autologous vein coronary bypass graft with angina pectoris (HCC) 03/13/2014   PCI using DES in SVG to LCx system   Dental caries    pre-heart valve surgery protocol   Gastroesophageal reflux disease    History of erectile dysfunction    History of hiatal hernia    History of kidney stones    History of obesity    History of prostatitis    Hypercholesterolemia    Hypertension    Kidney stones    LBBB (left bundle branch block)    Nodule of kidney 02/04/2016   Incidental 8mm nodule upper pole right kidney noted on CT angiogram - MRI with and without gadolinium contrast recommened in 6 months   OSA (obstructive sleep apnea)    "suppose to wear mask; I don't" (03/13/2014)   PVC's (premature ventricular contractions)    S/P CABG x 7 01/07/1995   LIMA to LAD-diagonal, SVG to OM-LCx, SVG to AM-PD-RPL   S/P TAVR (transcatheter aortic valve replacement) 02/18/2016   26 mm Edwards Sapien 3 transcatheter heart valve placed via percutaneous right transfemoral approach  ' Significant Hospital Events: Including procedures, antibiotic start and stop dates in addition  to other pertinent events   1/28 - Presented to [facility] ED from Cardiology clinic for SOB, LE edema, AECHF. 1/29 - AHF team consulted. Patient increasingly confused with development of LG fever, concern for cardiogenic/septic shock. PCCM consulted. Transferred to ICU.  Interim History / Subjective:  PCCM consulted for ICU evaluation and transfer. Feeling ok overall, still some SOB/significant  coughing. Legs feel better with wraps in place  Objective:  Blood pressure 101/71, pulse (!) 59, temperature 100.1 F (37.8 C), temperature source Oral, resp. rate 18, height 5\' 10"  (1.778 m), weight 79.7 kg, SpO2 94%.        Intake/Output Summary (Last 24 hours) at 02/10/2023 1419 Last data filed at 02/10/2023 1358 Gross per 24 hour  Intake 720 ml  Output 1000 ml  Net -280 ml   Filed Weights   02/09/23 1035 02/10/23 0410 02/10/23 0421  Weight: 79.4 kg 79.7 kg 79.7 kg   Physical Examination: General: Acutely ill-appearing elderly man in NAD. Pleasant and conversant. HEENT: /AT, anicteric sclera, PERRL, moist mucous membranes. Neuro: Awake, oriented x 4. Somewhat forgetful. Responds to verbal stimuli. Following commands consistently. Moves all 4 extremities spontaneously. Strength 4-5/5 in all 4 extremities.  CV: AV pacing + PVCs, no m/g/r. L chest with AICD in place. PULM: Breathing even and mildly labored on 4LNC. Lung fields with fine crackles at bases. GI: Soft, nontender, nondistended. Normoactive bowel sounds. Extremities: Bilateral symmetric 2-3+ LE edema noted with wraps in place. PICC RUE. Skin: Warm/dry, no rashes. Mild pallor.  Resolved Hospital Problem List:    Assessment & Plan:  Undifferentiated shock, cardiogenic versus septic Lactic acidosis - Transfer to ICU for higher level of care - Goal MAP > 65 - Inotropic support per AHF team, milrinone - Trend CVP, Co-ox (PICC placed) - Trend WBC, fever curve, LA - F/u PCT - F/u Cx data - Continue broad-spectrum antibiotics (cefepime/vanc, narrow as able)  Biventricular HFrEF with EF < 20% Ischemic cardiomyopathy with BiV-ICD in place CAD with previous CABG x 7, PCI x 2 with DES AS s/p TAVR 2018 HTN HLD Echo completed demonstrating EF  < 20%, +RWMAs, TAVR with perivalvular leak. Previous EF 03/2021 40%. - AHF team following, appreciate assistance - Not on Entresto/Jardiance due to financial constraint -  Diuresis per AHF (Lasix gtt, spironolactone) - GDMT as tolerated, limited by hypotension - Continue Plavix, statin/Zetia  Paroxysmal Afib CHA2DS2-VASc Score 5. - Cardiac monitoring - Optimize electrolytes for K > 4, Mg > 2 - Eliquis for AC  Influenza A infection - Continue Tamiflu - eRVP pending for flu typing per ID recommendations - Supplemental O2 support for O2 sat > 90% - Pulmonary hygiene - Follow CXR  AKI on CKD stage 3b - Trend BMP - Replete electrolytes as indicated - Monitor I&Os in the setting of diuresis - Avoid nephrotoxic agents as able - Ensure adequate renal perfusion  Transaminitis Hyperbilirubinemia Suspect due to shock/low-flow state. - Trend LFTs - Consider GI evaluation if not improving  GERD History of Barrett's esophagus - PPI  Best Practice: (right click and "Reselect all SmartList Selections" daily)   Diet/type: Regular consistency (see orders) DVT prophylaxis: DOAC GI prophylaxis: PPI Lines: Central line (PICC placed for Co-ox/CVP trending) Foley:  N/A Code Status:  limited Last date of multidisciplinary goals of care discussion [Pending]  Labs:  CBC: Recent Labs  Lab 02/09/23 1015  WBC 6.7  NEUTROABS 5.6  HGB 11.3*  HCT 35.4*  MCV 91.7  PLT 185   Basic Metabolic Panel: Recent  Labs  Lab 02/09/23 1015 02/10/23 0509  NA 133* 134*  K 4.3 3.8  CL 99 98  CO2 21* 25  GLUCOSE 115* 114*  BUN 54* 50*  CREATININE 2.57* 2.43*  CALCIUM 8.9 8.8*  MG 2.1  --    GFR: Estimated Creatinine Clearance: 25.5 mL/min (A) (by C-G formula based on SCr of 2.43 mg/dL (H)). Recent Labs  Lab 02/09/23 1015 02/10/23 1017 02/10/23 1221  WBC 6.7  --   --   LATICACIDVEN  --  2.8* 2.8*   Liver Function Tests: Recent Labs  Lab 02/09/23 1015  AST 997*  ALT 719*  ALKPHOS 77  BILITOT 3.1*  PROT 5.6*  ALBUMIN 3.0*   No results for input(s): "LIPASE", "AMYLASE" in the last 168 hours. No results for input(s): "AMMONIA" in the last 168  hours.  ABG:    Component Value Date/Time   PHART 7.357 02/18/2016 1329   PCO2ART 48.8 (H) 02/18/2016 1329   PO2ART 84.0 02/18/2016 1329   HCO3 27.5 02/18/2016 1329   TCO2 29 02/18/2016 1329   O2SAT 96.0 02/18/2016 1329    Coagulation Profile: No results for input(s): "INR", "PROTIME" in the last 168 hours.  Cardiac Enzymes: No results for input(s): "CKTOTAL", "CKMB", "CKMBINDEX", "TROPONINI" in the last 168 hours.  HbA1C: Hgb A1c MFr Bld  Date/Time Value Ref Range Status  02/14/2016 01:26 PM 6.0 (H) 4.8 - 5.6 % Final    Comment:    (NOTE)         Pre-diabetes: 5.7 - 6.4         Diabetes: >6.4         Glycemic control for adults with diabetes: <7.0    CBG: Recent Labs  Lab 02/10/23 1401  GLUCAP 84   Review of Systems:   Review of systems completed with pertinent positives/negatives outlined in above HPI.  Past Medical History:  He,  has a past medical history of Aortic stenosis, Barrett's esophageal ulceration, Chronic systolic CHF (congestive heart failure) (HCC), Coronary artery disease, Coronary artery disease involving autologous vein coronary bypass graft with angina pectoris (HCC) (03/13/2014), Dental caries, Gastroesophageal reflux disease, History of erectile dysfunction, History of hiatal hernia, History of kidney stones, History of obesity, History of prostatitis, Hypercholesterolemia, Hypertension, Kidney stones, LBBB (left bundle branch block), Nodule of kidney (02/04/2016), OSA (obstructive sleep apnea), PVC's (premature ventricular contractions), S/P CABG x 7 (01/07/1995), and S/P TAVR (transcatheter aortic valve replacement) (02/18/2016).   Surgical History:   Past Surgical History:  Procedure Laterality Date   CARDIAC CATHETERIZATION  1996; ~ 2006   ; Ejection fraction is estimated at 45%   CARDIAC CATHETERIZATION N/A 01/22/2016   Procedure: Right/Left Heart Cath and Coronary/Graft Angiography;  Surgeon: [name] Swaziland, MD;  Location[facility] INVASIVE CV LAB;   Service: Cardiovascular;  Laterality: N/A;   CHOLECYSTECTOMY N/A 01/23/2014   Procedure: LAPAROSCOPIC CHOLECYSTECTOMY WITH INTRAOPERATIVE CHOLANGIOGRAM ;  Surgeon: Claud Kelp, MD;  Location: [facility] OR;  Service: General;  Laterality: N/A;   COLONOSCOPY W/ POLYPECTOMY     CORONARY ANGIOPLASTY WITH STENT PLACEMENT  03/13/2014   "1"   CORONARY ARTERY BYPASS GRAFT  1996   "CABG X 7"   CORONARY STENT INTERVENTION N/A 12/26/2020   Procedure: CORONARY STENT INTERVENTION;  Surgeon: Swaziland, [name], MD;  Location: [facility] INVASIVE CV LAB;  Service: Cardiovascular;  Laterality: N/A;   CYSTOSCOPY WITH STENT PLACEMENT     EP IMPLANTABLE DEVICE N/A 12/17/2014   Procedure: BiV ICD Insertion CRT-D;  Surgeon: Doylene Canning  Ladona Ridgel, MD;  Location: [facility] INVASIVE CV LAB;  Service: Cardiovascular;  Laterality: N/A;   ICD GENERATOR CHANGEOUT N/A 06/02/2022   Procedure: ICD GENERATOR CHANGEOUT;  Surgeon: Marinus Maw, MD;  Location: [facility] INVASIVE CV LAB;  Service: Cardiovascular;  Laterality: N/A;   LEFT HEART CATH AND CORS/GRAFTS ANGIOGRAPHY N/A 12/26/2020   Procedure: LEFT HEART CATH AND CORS/GRAFTS ANGIOGRAPHY;  Surgeon: Swaziland, [name], MD;  Location: [facility] INVASIVE CV LAB;  Service: Cardiovascular;  Laterality: N/A;   LEFT HEART CATHETERIZATION WITH CORONARY/GRAFT ANGIOGRAM N/A 03/13/2014   Procedure: LEFT HEART CATHETERIZATION WITH Isabel Caprice;  Surgeon: [name] Swaziland, MD;  Location: [facility] CATH LAB;  Service: Cardiovascular;  Laterality: N/A;   MULTIPLE EXTRACTIONS WITH ALVEOLOPLASTY N/A 02/07/2016   Procedure: Extraction of tooth #'s 1,2,3,14,15, 18, 20, 29,30,31 with alveoloplasty and dental cleaning of teeth.;  Surgeon: Charlynne Pander, DDS;  Location: [facility] OR;  Service: Oral Surgery;  Laterality: N/A;   TEE WITHOUT CARDIOVERSION N/A 02/18/2016   Procedure: TRANSESOPHAGEAL ECHOCARDIOGRAM (TEE);  Surgeon: Tonny Bollman, MD;  Location: [facility] OR;  Service: Open Heart Surgery;  Laterality: N/A;   TONSILLECTOMY  ~ 1950    TRANSCATHETER AORTIC VALVE REPLACEMENT, TRANSFEMORAL N/A 02/18/2016   Procedure: TRANSCATHETER AORTIC VALVE REPLACEMENT, TRANSFEMORAL;  Surgeon: Tonny Bollman, MD;  Location: [facility] OR;  Service: Open Heart Surgery;  Laterality: N/A;   Social History:   reports that he quit smoking about 26 years ago. His smoking use included cigarettes. He started smoking about 41 years ago. He has a 11.3 pack-year smoking history. He has never used smokeless tobacco. He reports that he does not drink alcohol and does not use drugs.   Family History:  His family history includes Cancer in his sister; Heart attack in his father.   Allergies: No Known Allergies   Home Medications: Prior to Admission medications   Medication Sig Start Date End Date Taking? Authorizing Provider  Ascorbic Acid (VITAMIN C) 500 MG CAPS Take 500 mg by mouth in the morning.   Yes [provider]  atorvastatin (LIPITOR) 80 MG tablet TAKE 1 TABLET BY MOUTH DAILY Patient taking differently: Take 80 mg by mouth in the morning. 03/23/22  Yes Swazil[place], [name], MD  Cholecalciferol (VITAMIN D-3) 5000 units TABS Take 5,000 Units by mouth in the morning.   Yes [provider]  clopidogrel (PLAVIX) 75 MG tablet Take 1 tablet (75 mg total) by mouth daily. Patient taking differently: Take 75 mg by mouth in the morning. 11/30/22  Yes [place], [name], MD  ezetimibe (ZETIA) 10 MG tablet Take 1 tablet (10 mg total) by mouth daily. Patient taking differently: Take 10 mg by mouth in the morning. 07/22/22 02/09/23 Yes [place]azil[place], [name], MD  finasteride (PROSCAR) 5 MG tablet Take 5 mg by mouth in the morning.   Yes [provider]  furosemide (LASIX) 40 MG tablet Take 1 tablet (40 mg total) by mouth daily. Patient taking differently: Take 40 mg by mouth in the morning. 02/08/23  Yes Swazil[place], [name], MD  gabapentin (NEURONTIN) 300 MG capsule Take 1 capsule (300 mg total) by mouth 2 (two) times daily with breakfast and lunch AND 2 capsules  (600 mg total) at bedtime. Patient taking differently: Take 1 capsule (300 mg total) by mouth 2 (two) times daily. 01/21/23 04/21/23 Yes McDonald, Rachelle Hora, DPM  ibuprofen (ADVIL) 200 MG tablet Take 400 mg by mouth every 6 (six) hours as needed for moderate pain (pain score 4-6) or mild pain (pain  score 1-3).   Yes [provider]  isosorbide mononitrate (IMDUR) 120 MG 24 hr tablet TAKE 1 TABLET BY MOUTH DAILY Patient taking differently: Take 120 mg by mouth in the morning. 03/23/22  Yes [place]az[place], [name], MD  metoprolol succinate (TOPROL-XL) 50 MG 24 hr tablet TAKE 1 TABLET BY MOUTH DAILY WITH OR IMEDIATELY FOLLOWING A MEAL Patient taking differently: Take 50 mg by mouth in the morning. TAKE 1 TABLET BY MOUTH DAILY WITH OR IMEDIATELY FOLLOWING A MEAL 09/22/22  Yes Marinus Maw, MD  nitroGLYCERIN (NITROSTAT) 0.4 MG SL tablet Place 1 tablet (0.4 mg total) under the tongue every 5 (five) minutes as needed. DISSOLVE 1 TAB UNDER TONGUE FOR CHEST PAIN - IF PAIN REMAINS AFTER 5 MIN, CALL 911 AND REPEAT DOSE. MAX 3 TABS IN 15 MINUTES. 01/18/23  Yes Lewayne Bunting, MD  oxybutynin (DITROPAN) 5 MG tablet Take 5 mg by mouth in the morning. 07/04/12  Yes [provider]  pantoprazole (PROTONIX) 40 MG tablet TAKE 1 TABLET BY MOUTH DAILY Patient taking differently: Take 40 mg by mouth in the morning. 08/10/22  Yes Swazil[place], [name], MD  ranolazine (RANEXA) 500 MG 12 hr tablet TAKE 1 TABLET BY MOUTH TWICE A DAY 01/14/23  Yes [place]az[place], [name], MD  spironolactone (ALDACTONE) 25 MG tablet Take 0.5 tablets (12.5 mg total) by mouth daily. Patient taking differently: Take 12.5 mg by mouth in the morning. 11/17/22  Yes [place]az[place], [name], MD  valACYclovir (VALTREX) 500 MG tablet TAKE ONE TABLET BY MOUTH DAILY AS NEEDED FOR OUTBREAK Patient taking differently: Take 500 mg by mouth daily as needed (Outbreaks). 06/13/19  Yes [place], [name], MD  vitamin B-12 (CYANOCOBALAMIN) 500 MCG tablet Take 500 mcg by mouth in the  morning.   Yes [provider]   Critical care time:   The patient is critically ill with multiple organ system failure and requires high complexity decision making for assessment and support, frequent evaluation and titration of therapies, advanced monitoring, review of radiographic studies and interpretation of complex data.   Critical Care Time devoted to patient care services, exclusive of separately billable procedures, described in this note is 39 minutes.  Tim Lair, PA-C [facility] 02/10/23 2:19 PM  Please see [email or web address] for pager details.  From 7A-7P if no response, please call [phone] After hours, please call ELink [phone]

## 2023-02-10 NOTE — Progress Notes (Signed)
Received verbal report from [employer] device rep  Optivol elevated c/w volume overload  BiV pacing 98.4% Effective pacing 98%  Last Afib detection was 09/11/22  They will leave copy of report in paper chart   Robbie Lis, PA-C

## 2023-02-10 NOTE — Progress Notes (Signed)
Peripherally Inserted Central Catheter Placement  The IV Nurse has discussed with the patient and/or persons authorized to consent for the patient, the purpose of this procedure and the potential benefits and risks involved with this procedure.  The benefits include less needle sticks, lab draws from the catheter, and the patient may be discharged home with the catheter. Risks include, but not limited to, infection, bleeding, blood clot (thrombus formation), and puncture of an artery; nerve damage and irregular heartbeat and possibility to perform a PICC exchange if needed/ordered by physician.  Alternatives to this procedure were also discussed.  Bard Power PICC patient education guide, fact sheet on infection prevention and patient information card has been provided to patient /or left at bedside.    PICC Placement Documentation  PICC Double Lumen 02/10/23 Left Basilic 44 cm 0 cm (Active)  Indication for Insertion or Continuance of Line Prolonged intravenous therapies 02/10/23 1428  Exposed Catheter (cm) 0 cm 02/10/23 1428  Site Assessment Clean, Dry, Intact 02/10/23 1428  Lumen #1 Status Flushed;Saline locked;Blood return noted 02/10/23 1428  Lumen #2 Status Flushed;Saline locked;Blood return noted 02/10/23 1428  Dressing Type Transparent;Securing device 02/10/23 1428  Dressing Status Antimicrobial disc/dressing in place 02/10/23 1428  Line Care Connections checked and tightened 02/10/23 1428  Line Adjustment (NICU/IV Team Only) No 02/10/23 1428  Dressing Intervention New dressing;Adhesive placed at insertion site (IV team only) 02/10/23 1428  Dressing Change Due 02/17/23 02/10/23 1428       Vernona Rieger  D'Arcy Abraha 02/10/2023, 2:31 PM

## 2023-02-10 NOTE — Progress Notes (Signed)
Orthopedic Tech Progress Note Patient Details:  [name] 1945-12-13 [id number]  Ortho Devices Type of Ortho Device: Ace wrap, Unna boot Ortho Device/Splint Location: BLE Ortho Device/Splint Interventions: Ordered, Application, Adjustment   Post Interventions Patient Tolerated: Well Instructions Provided: Care of device  Donald Pore 02/10/2023, 2:40 PM

## 2023-02-10 NOTE — Plan of Care (Signed)

## 2023-02-10 NOTE — Progress Notes (Signed)
Pharmacy Antibiotic Note  [name] is a 78 y.o. male admitted on 02/09/2023 with Flu A + and being worked up for bacteremia.  Increased confusion through the day, BP dropped 90s UA + bacteria, wnc wnl febrile 100.5 and rising lactic acid and Cr 2.5 crcl approx 72ml/min Pharmacy has been consulted for vancomycin and cefepime  dosing.  Plan: Vancomycin 1250mg  IV x1 then monitor renal function for redosing Cefepime 2gm IV q12h monitor renal function for dosing adjustment  Tamiflu 30mg  every day x5days   Height: 5\' 10"  (177.8 cm) Weight: 79.7 kg (175 lb 9.6 oz) IBW/kg (Calculated) : 73  Temp (24hrs), Avg:98.6 F (37 C), Min:97.7 F (36.5 C), Max:100.1 F (37.8 C)  Recent Labs  Lab 02/09/23 1015 02/10/23 0509 02/10/23 1017 02/10/23 1221 02/10/23 1440  WBC 6.7  --   --   --  8.1  CREATININE 2.57* 2.43*  --   --   --   LATICACIDVEN  --   --  2.8* 2.8* 5.9*    Estimated Creatinine Clearance: 25.5 mL/min (A) (by C-G formula based on SCr of 2.43 mg/dL (H)).    No Known Allergies  Antimicrobials this admission:   Dose adjustments this admission:   Microbiology results:   Leota Sauers Pharm.D. CPP, BCPS Clinical Pharmacist [phone] 02/10/2023 4:23 PM

## 2023-02-10 NOTE — Telephone Encounter (Signed)
Pt was sent to the hospital from having a stress test yesterday. Wife wants to talk a nurse

## 2023-02-11 DIAGNOSIS — J810 Acute pulmonary edema: Secondary | ICD-10-CM

## 2023-02-11 DIAGNOSIS — J101 Influenza due to other identified influenza virus with other respiratory manifestations: Secondary | ICD-10-CM

## 2023-02-11 DIAGNOSIS — I4891 Unspecified atrial fibrillation: Secondary | ICD-10-CM

## 2023-02-11 DIAGNOSIS — R570 Cardiogenic shock: Secondary | ICD-10-CM

## 2023-02-11 DIAGNOSIS — I5023 Acute on chronic systolic (congestive) heart failure: Secondary | ICD-10-CM

## 2023-02-11 DIAGNOSIS — E876 Hypokalemia: Secondary | ICD-10-CM

## 2023-02-11 DIAGNOSIS — J9601 Acute respiratory failure with hypoxia: Secondary | ICD-10-CM | POA: Diagnosis not present

## 2023-02-11 DIAGNOSIS — I214 Non-ST elevation (NSTEMI) myocardial infarction: Secondary | ICD-10-CM

## 2023-02-11 LAB — COOXEMETRY PANEL
Carboxyhemoglobin: 1.4 % (ref 0.5–1.5)
Carboxyhemoglobin: 1.7 % — ABNORMAL HIGH (ref 0.5–1.5)
Methemoglobin: 0.7 % (ref 0.0–1.5)
Methemoglobin: 0.7 % (ref 0.0–1.5)
O2 Saturation: 51.3 %
O2 Saturation: 52.8 %
Total hemoglobin: 11.8 g/dL — ABNORMAL LOW (ref 12.0–16.0)
Total hemoglobin: 11.4 g/dL — ABNORMAL LOW (ref 12.0–16.0)

## 2023-02-11 LAB — BASIC METABOLIC PANEL
Anion gap: 11 (ref 5–15)
Anion gap: 11 (ref 5–15)
BUN: 43 mg/dL — ABNORMAL HIGH (ref 8–23)
BUN: 38 mg/dL — ABNORMAL HIGH (ref 8–23)
CO2: 30 mmol/L (ref 22–32)
CO2: 29 mmol/L (ref 22–32)
Calcium: 8 mg/dL — ABNORMAL LOW (ref 8.9–10.3)
Calcium: 8.1 mg/dL — ABNORMAL LOW (ref 8.9–10.3)
Chloride: 93 mmol/L — ABNORMAL LOW (ref 98–111)
Chloride: 93 mmol/L — ABNORMAL LOW (ref 98–111)
Creatinine, Ser: 2.1 mg/dL — ABNORMAL HIGH (ref 0.61–1.24)
Creatinine, Ser: 1.92 mg/dL — ABNORMAL HIGH (ref 0.61–1.24)
GFR, Estimated: 31 mL/min — ABNORMAL LOW (ref >60)
GFR, Estimated: 35 mL/min — ABNORMAL LOW (ref >60)
Glucose, Bld: 211 mg/dL — ABNORMAL HIGH (ref 70–99)
Glucose, Bld: 220 mg/dL — ABNORMAL HIGH (ref 70–99)
Potassium: 2.7 mmol/L — CL (ref 3.5–5.1)
Potassium: 3.1 mmol/L — ABNORMAL LOW (ref 3.5–5.1)
Sodium: 134 mmol/L — ABNORMAL LOW (ref 135–145)
Sodium: 133 mmol/L — ABNORMAL LOW (ref 135–145)

## 2023-02-11 LAB — CBC
HCT: 35 % — ABNORMAL LOW (ref 39.0–52.0)
Hemoglobin: 11.3 g/dL — ABNORMAL LOW (ref 13.0–17.0)
MCH: 28.7 pg (ref 26.0–34.0)
MCHC: 32.3 g/dL (ref 30.0–36.0)
MCV: 88.8 fL (ref 80.0–100.0)
Platelets: 171 10*3/uL (ref 150–400)
RBC: 3.94 MIL/uL — ABNORMAL LOW (ref 4.22–5.81)
RDW: 15.6 % — ABNORMAL HIGH (ref 11.5–15.5)
WBC: 10.1 10*3/uL (ref 4.0–10.5)
nRBC: 0.4 % — ABNORMAL HIGH (ref 0.0–0.2)

## 2023-02-11 LAB — HEPATIC FUNCTION PANEL
ALT: 580 U/L — ABNORMAL HIGH (ref 0–44)
AST: 654 U/L — ABNORMAL HIGH (ref 15–41)
Albumin: 2.6 g/dL — ABNORMAL LOW (ref 3.5–5.0)
Alkaline Phosphatase: 64 U/L (ref 38–126)
Bilirubin, Direct: 0.7 mg/dL — ABNORMAL HIGH (ref 0.0–0.2)
Indirect Bilirubin: 2.4 mg/dL — ABNORMAL HIGH (ref 0.3–0.9)
Total Bilirubin: 3.1 mg/dL — ABNORMAL HIGH (ref 0.0–1.2)
Total Protein: 5.3 g/dL — ABNORMAL LOW (ref 6.5–8.1)

## 2023-02-11 LAB — MAGNESIUM: Magnesium: 2 mg/dL (ref 1.7–2.4)

## 2023-02-11 MED ORDER — ORAL CARE MOUTH RINSE: 15.0000 mL | OROMUCOSAL | Status: DC | PRN

## 2023-02-11 MED ORDER — POTASSIUM CHLORIDE CRYS ER 20 MEQ PO TBCR
40.0000 meq | EXTENDED_RELEASE_TABLET | Freq: Once | ORAL | Status: AC
  Administered 2023-02-11: 40 meq via ORAL
  Filled 2023-02-11: qty 2

## 2023-02-11 MED ORDER — ALBUTEROL SULFATE (2.5 MG/3ML) 0.083% IN NEBU: 2.5000 mg | INHALATION_SOLUTION | RESPIRATORY_TRACT | Status: DC | PRN

## 2023-02-11 MED ORDER — VANCOMYCIN HCL IN DEXTROSE 1-5 GM/200ML-% IV SOLN: 1000.0000 mg | INTRAVENOUS | Status: DC

## 2023-02-11 MED ORDER — SPIRONOLACTONE 25 MG PO TABS
25.0000 mg | ORAL_TABLET | Freq: Every day | ORAL | Status: DC
Start: 2023-02-11 — End: 2023-02-16
  Administered 2023-02-11 – 2023-02-15 (×5): 25 mg via ORAL
  Filled 2023-02-11 (×6): qty 1

## 2023-02-11 MED ORDER — POTASSIUM CHLORIDE 10 MEQ/50ML IV SOLN
10.0000 meq | INTRAVENOUS | Status: AC
  Administered 2023-02-11 (×4): 10 meq via INTRAVENOUS
  Filled 2023-02-11 (×4): qty 50

## 2023-02-11 MED ORDER — ISOSORBIDE MONONITRATE ER 30 MG PO TB24
30.0000 mg | ORAL_TABLET | Freq: Every day | ORAL | Status: DC
Start: 2023-02-11 — End: 2023-02-16
  Administered 2023-02-11 – 2023-02-15 (×5): 30 mg via ORAL
  Filled 2023-02-11 (×6): qty 1

## 2023-02-11 MED ORDER — POTASSIUM CHLORIDE CRYS ER 20 MEQ PO TBCR
20.0000 meq | EXTENDED_RELEASE_TABLET | ORAL | Status: AC
  Administered 2023-02-11 (×2): 20 meq via ORAL
  Filled 2023-02-11 (×2): qty 1

## 2023-02-11 NOTE — Progress Notes (Signed)
[facility] ADULT ICU REPLACEMENT PROTOCOL   The patient does apply for the [facility] Adult ICU Electrolyte Replacment Protocol based on the criteria listed below:   1.Exclusion criteria: TCTS, ECMO, Dialysis, and Myasthenia Gravis patients 2. Is GFR >/= 30 ml/min? Yes.    Patient's GFR today is 31 3. Is SCr </= 2? Yes.   Patient's SCr is 2 mg/dL 4. Did SCr increase >/= 0.5 in 24 hours? No. 5.Pt's weight >40kg  Yes.   6. Abnormal electrolyte(s): k 2.7  7. Electrolytes replaced per protocol 8.  Call MD STAT for K+ </= 2.5, Phos </= 1, or Mag </= 1 Physician:  Mauri Pole, Kiaya Halib[place]ton A 02/11/2023 6:06 AM

## 2023-02-11 NOTE — Progress Notes (Signed)
NAME:  [name], MRN:  [id number], DOB:  1945/08/03, LOS: 2 ADMISSION DATE:  02/09/2023 CONSULTATION DATE:  02/10/2023 REFERRING MD:  Anne Fu - Cardiology, CHIEF COMPLAINT: Shock, cardiogenic versus septic   History of Present Illness:  78 year old man who presented to [facility] 1/28 for SOB and leg swelling. PMHx significant for HTN, HLD, CAD (s/p CABG x 7 1996, PCI with DES to SVG-OM2 03/2014, 12/2020) with ischemic CM (s/p BiV-ICD), AS s/p TAVR (2018), HFrEF (Echo 03/2021 with EF ~40% with G3DD), PAF (on Eliquis), LBBB, OSA (not on CPAP), GERD c/b Barrett's esophagus, nephrolithiasis.  History is obtained primarily from chart review. Patient initially contacted his Cardiology office 1/27 complaining of a few days of SOB (both at rest/with activity) and leg swelling; reported weight gain of 5lb over the few days PTA. Additionally, c/o new onset CP and "tightness" with dyspnea. Patient was advised to increase Lasix dose and to call back if no improvement in symptoms. He presented to Cardiology clinic 1/28 with ongoing CP/SOB, significant LE edema and orthopnea. Denied fever/chills, endorsed cough. Denies nausea/vomiting, endorses some diarrhea.   Subsequently was sent from Cardiology clinic to [facility] ED for presumed heart failure exacerbation. On ED arrival, patient was afebrile with HR 88 (AV paced), BP 112/68, RR 24, SpO2 93% on 2L). Noted to be somewhat confused. Labs were notable for BNP 1804. LA 2.8 > 2.8, Flu A+. UA +moderate Hgb, few bacteria. CXR with mild cardiomegaly. Echo completed demonstrating EF  < 20%, +RWMAs, TAVR with perivalvular leak. Admitted under the Cardiology service. AHF consulted 1/29.   On 1/29, patient was noted to be acutely more confused/tearful with plateaued LA and low grade fever with concern for development of shock. PCCM consulted for ICU evaluation and transfer.  Pertinent Medical History:   Past Medical History:  Diagnosis Date   Aortic stenosis    Barrett's  esophageal ulceration    Chronic systolic CHF (congestive heart failure) (HCC)    Coronary artery disease    a. CABG 1996: LIMA graft to the LAD and diagonal, sequential vein graft to the acute marginal, PDA, and posterior lateral branches of the right coronary, sequential saphenous vein graft to the obtuse marginal vessel and distal circumflex. b. Cath 2007 patent grafts. c. s/p DES to SVG-OM2 in 03/2014.   Coronary artery disease involving autologous vein coronary bypass graft with angina pectoris (HCC) 03/13/2014   PCI using DES in SVG to LCx system   Dental caries    pre-heart valve surgery protocol   Gastroesophageal reflux disease    History of erectile dysfunction    History of hiatal hernia    History of kidney stones    History of obesity    History of prostatitis    Hypercholesterolemia    Hypertension    Kidney stones    LBBB (left bundle branch block)    Nodule of kidney 02/04/2016   Incidental 8mm nodule upper pole right kidney noted on CT angiogram - MRI with and without gadolinium contrast recommened in 6 months   OSA (obstructive sleep apnea)    "suppose to wear mask; I don't" (03/13/2014)   PVC's (premature ventricular contractions)    S/P CABG x 7 01/07/1995   LIMA to LAD-diagonal, SVG to OM-LCx, SVG to AM-PD-RPL   S/P TAVR (transcatheter aortic valve replacement) 02/18/2016   26 mm Edwards Sapien 3 transcatheter heart valve placed via percutaneous right transfemoral approach  ' Significant Hospital Events: Including procedures, antibiotic start and stop dates in addition  to other pertinent events   1/28 - Presented to [facility] ED from Cardiology clinic for SOB, LE edema, AECHF. 1/29 - AHF team consulted. Patient increasingly confused with development of LG fever, concern for cardiogenic/septic shock. PCCM consulted. Transferred to ICU.  Interim History / Subjective:  No significant events overnight Feeling overall better this morning, breathing feels good Remains on  milrinone 0.25, NE (nontitrated) Lasix gtt at 10, 3.8L UOP, net -1.4L/24H  Objective:  Blood pressure 129/66, pulse 65, temperature 99.5 F (37.5 C), temperature source Oral, resp. rate (!) 24, height 5\' 10"  (1.778 m), weight 77.6 kg, SpO2 95%. CVP:  [3 mmHg-26 mmHg] 7 mmHg      Intake/Output Summary (Last 24 hours) at 02/11/2023 0733 Last data filed at 02/11/2023 0600 Gross per 24 hour  Intake 2309.07 ml  Output 3800 ml  Net -1490.93 ml   Filed Weights   02/10/23 0410 02/10/23 0421 02/11/23 0500  Weight: 79.7 kg 79.7 kg 77.6 kg   Physical Examination: General: Acutely ill-appearing elderly man in NAD. Pleasant and conversant. HEENT: [place]/AT, ?mildly icteric sclera, PERRL, moist mucous membranes. Neuro: Awake, oriented x 4. Responds to verbal stimuli. Following commands consistently. Moves all 4 extremities spontaneously. Strength 5/5 in all 4 extremities.  CV: AV pacing, occasional PVCs, no m/g/r. +AICD PULM: Breathing even and unlabored on 4LNC. Lung fields with scattered rhonchi, fine basilar crackles. GI: Soft, nontender, nondistended. Normoactive bowel sounds. Extremities: Bilateral symmetric 2+ LE edema noted with wraps in place. Skin: Warm/dry, no rashes, ?faint facial jaundice  Resolved Hospital Problem List:    Assessment & Plan:  Undifferentiated shock, cardiogenic versus septic Lactic acidosis, resolving - Goal MAP > 65 - Inotropic support per AHF team, milrinone - Trend CVP, Co-ox via RUE PICC - Trend WBC, fever curve, LA - PCT 0.23, follow Cx data - Continue broad-spectrum antibiotics (cefepime/vanc, narrow as able)  Biventricular HFrEF with EF < 20% Ischemic cardiomyopathy with BiV-ICD in place CAD with previous CABG x 7, PCI x 2 with DES AS s/p TAVR 2018 HTN HLD Echo completed demonstrating EF  < 20%, +RWMAs, TAVR with perivalvular leak. Previous EF 03/2021 40%. Not on Entresto/Jardiance due to financial constraint. - AHF team primary - Diuresis per  primary (Lasix gtt, spiro) - GDMT as tolerated, limited by hypotension/inotrope need - No BB given shock - Continue Plavix, statin/Zetia - Diuresis per AHF (Lasix gtt, spironolactone) - GDMT as tolerated, limited by hypotension - Continue Plavix, statin/Zetia  Paroxysmal Afib CHA2DS2-VASc Score 5. - Cardiac monitoring - Optimize electrolytes for K > 4, Mg > 2 - Eliquis for AC  Influenza A infection, type H3 - Continue Tamiflu - Supplemental O2 support for O2 sat > 90% - Pulmonary hygiene - Follow CXR  AKI on CKD stage 3b - Trend BMP - Replete electrolytes as indicated - Monitor I&Os in the setting of diuresis - Avoid nephrotoxic agents as able - Ensure adequate renal perfusion  Transaminitis Hyperbilirubinemia Suspect due to shock/low-flow state. - Trend LFTs - Consider GI evaluation if not improving  GERD History of Barrett's esophagus - PPI  Best Practice: (right click and "Reselect all SmartList Selections" daily)   Diet/type: Regular consistency (see orders) DVT prophylaxis: DOAC GI prophylaxis: PPI Lines: Central line (PICC placed for Co-ox/CVP trending) Foley:  N/A Code Status:  limited Last date of multidisciplinary goals of care discussion [Pending]  Critical care time:   The patient is critically ill with multiple organ system failure and requires high complexity decision making for assessment  and support, frequent evaluation and titration of therapies, advanced monitoring, review of radiographic studies and interpretation of complex data.   Critical Care Time devoted to patient care services, exclusive of separately billable procedures, described in this note is 34 minutes.  Tim Lair, PA-C [facility] 02/11/23 7:33 AM  Please see [email or web address] for pager details.  From 7A-7P if no response, please call [phone] After hours, please call ELink [phone]

## 2023-02-11 NOTE — Progress Notes (Signed)
Advanced Heart Failure Rounding Note  Cardiologist: Peter [place], MD   Chief Complaint: Mixed cardiogenic/septic shock  Subjective:   01/29: Transferred to the ICU for shock. Started on milrinone + NE.   Much more alert this morning. Does not recall any events from yesterday.   No dyspnea at rest. + cough and fatigue.   Objective:   Weight Range: 77.6 kg Body mass index is 24.55 kg/m.   Vital Signs:   Temp:  [99.5 F (37.5 C)-100.1 F (37.8 C)] 99.5 F (37.5 C) (01/29 1555) Pulse Rate:  [56-89] 65 (01/30 0700) Resp:  [16-32] 24 (01/30 0700) BP: (97-142)/(48-117) 129/66 (01/30 0700) SpO2:  [82 %-98 %] 95 % (01/30 0700) Weight:  [77.6 kg] 77.6 kg (01/30 0500) Last BM Date : 02/08/23  Weight change: Filed Weights   02/10/23 0410 02/10/23 0421 02/11/23 0500  Weight: 79.7 kg 79.7 kg 77.6 kg    Intake/Output:   Intake/Output Summary (Last 24 hours) at 02/11/2023 0734 Last data filed at 02/11/2023 0600 Gross per 24 hour  Intake 2309.07 ml  Output 3800 ml  Net -1490.93 ml     CVP 11 Physical Exam    General:  Ill appearing. Lying in bed. No distress. HEENT: Normal Neck: Supple. JVP 10-12.  Cor: Regular rate & rhythm. No rubs, gallops or murmurs. Lungs: B/l rhonchi. Comfortable on 4L [place]. Abdomen: Soft, nontender, nondistended.  Extremities: No cyanosis, clubbing, rash, 2+ edema, + UNNA boots Neuro: Alert & orientedx3. Affect pleasant   Telemetry   V paced 60s, PVCs  EKG    No new  Labs    CBC Recent Labs    02/09/23 1015 02/10/23 1440  WBC 6.7 8.1  NEUTROABS 5.6  --   HGB 11.3* 11.9*  HCT 35.4* 37.1*  MCV 91.7 89.6  PLT 185 195   Basic Metabolic Panel Recent Labs    40/98/11 1015 02/10/23 0509 02/11/23 0522  NA 133* 134* 134*  K 4.3 3.8 2.7*  CL 99 98 93*  CO2 21* 25 30  GLUCOSE 115* 114* 211*  BUN 54* 50* 43*  CREATININE 2.57* 2.43* 2.10*  CALCIUM 8.9 8.8* 8.0*  MG 2.1  --  2.0   Liver Function Tests Recent Labs     02/09/23 1015 02/11/23 0522  AST 997* 654*  ALT 719* 580*  ALKPHOS 77 64  BILITOT 3.1* 3.1*  PROT 5.6* 5.3*  ALBUMIN 3.0* 2.6*   No results for input(s): "LIPASE", "AMYLASE" in the last 72 hours. Cardiac Enzymes No results for input(s): "CKTOTAL", "CKMB", "CKMBINDEX", "TROPONINI" in the last 72 hours.  BNP: BNP (last 3 results) Recent Labs    02/10/23 0509  BNP 1,804.8*    ProBNP (last 3 results) No results for input(s): "PROBNP" in the last 8760 hours.   D-Dimer No results for input(s): "DDIMER" in the last 72 hours. Hemoglobin A1C No results for input(s): "HGBA1C" in the last 72 hours. Fasting Lipid Panel No results for input(s): "CHOL", "HDL", "LDLCALC", "TRIG", "CHOLHDL", "LDLDIRECT" in the last 72 hours. Thyroid Function Tests No results for input(s): "TSH", "T4TOTAL", "T3FREE", "THYROIDAB" in the last 72 hours.  Invalid input(s): "FREET3"  Other results:   Imaging    [place] EKG SITE RITE Result Date: 02/10/2023 If Site Rite image not attached, placement could not be confirmed due to current cardiac rhythm.  DG Chest Port 1 View Result Date: 02/10/2023 CLINICAL DATA:  PICC line placement.  Shortness of breath. EXAM: PORTABLE CHEST 1 VIEW COMPARISON:  02/09/2023 FINDINGS:  Left chest ICD. Heart is enlarged and previous TAVR procedure. Previous median sternotomy. Increased perihilar densities could represent pulmonary edema. PICC line is not visualized. Negative for a pneumothorax. IMPRESSION: 1. PICC line is not visualized. 2. Cardiomegaly with increased perihilar densities. Findings could represent pulmonary edema. Electronically Signed   By: Richarda Overlie M.D.   On: 02/10/2023 15:52   [place] EKG SITE RITE Result Date: 02/10/2023 If Site Rite image not attached, placement could not be confirmed due to current cardiac rhythm.    Medications:     Scheduled Medications:  apixaban  5 mg Oral BID   atorvastatin  80 mg Oral Daily   Chlorhexidine Gluconate Cloth  6 each  Topical Daily   clopidogrel  75 mg Oral Daily   ezetimibe  10 mg Oral Daily   finasteride  5 mg Oral Daily   isosorbide mononitrate  120 mg Oral Daily   oseltamivir  30 mg Oral Daily   pantoprazole  40 mg Oral Daily   ranolazine  500 mg Oral BID   sodium chloride flush  10-40 mL Intracatheter Q12H   sodium chloride flush  3 mL Intravenous Q12H   vancomycin variable dose per unstable renal function (pharmacist dosing)   Does not apply See admin instructions    Infusions:  ceFEPime (MAXIPIME) IV Stopped (02/11/23 0557)   furosemide (LASIX) 200 mg in dextrose 5 % 100 mL (2 mg/mL) infusion 10 mg/hr (02/11/23 4782)   milrinone 0.25 mcg/kg/min (02/11/23 0600)   norepinephrine (LEVOPHED) Adult infusion 5 mcg/min (02/11/23 9562)   potassium chloride 10 mEq (02/11/23 0729)    PRN Medications: acetaminophen, ondansetron (ZOFRAN) IV, sodium chloride flush, sodium chloride flush    Patient Profile   78 y/o male w/ CAD s/p CABG in 1996 and subsequent stenting, LBBB, chronic systolic CHF s/p BiV ICD, low gradient, low flow severe AS s/p TAVR (02/2016).     Admitted w/ a/c BiV CHF w/ marked volume overload and low output. Influenza A +  Assessment/Plan  Mixed septic/cardiogenic shock -Transferred to ICU 01/29 with AMS and lactic acidosis. -Treated with Tamiflu for flu A and empiric abx (vanc + cefepime) for suspected superimposed PNA -Lactic acid peaked at 5.9>>cleared -Remains on milrinone 0.25 mcg/kg/min + NE 5 mcg/min. CO-OX 51% with assumed Fick CI 1.8 L/min/m2. Repeat CO-OX 53%.  -Will continue current support as he seems to be improving clinically.   2. Acute on chronic Biventricular Systolic Heart Failure  - h/o ischemic CM + Known LBBB s/p CRT-D  - EF has been as low as 30-35% in the past. - Echo 2019 EF improved to 40-45%.   - Echo in 2023 EF 50-55%, RV mildly reduced, mild LVH, GIIIDD (restrictive) - -Echo with EF < 20%, RWMA, RV mildly reduced, moderate to severe TR - Now  admitted w/ NYHA Class IIIb symptoms and shock as above. - Remains volume up. Diuresing well with lasix gtt at 10/hr, continue today - off  ? blocker with shock - Add back spiro at 25 mg daily - Imdur reduced from 120 mg to 30 mg daily - no additional GDMT until off pressors - Etiology for drop in EF uncertain. ? If 2/2 flu A/PNA. Plan to recheck echo once he begins to recover. If EF remains reduced, may need to consider ischemic workup vs r/o infiltrative process given hx conduction system disease and AS.   3. AKI on CKD IIIb - B/l SCr ~1.7 - Cardiorenal/low-output - SCr 2.6 on admit>>improved to 2.1 today  4. Shock Liver - Improving. Continue to follow LFTs   5. PAF - SR this admit. Last Afib detection on device in 08/24. - on Eliquis    6. CAD - s/p remote CABG in 1996 - last cath 2022: patent LIMA-LAD, occluded SVG-OM3 w/ collaterals, 95% pSVG-PDA treated w/ DES  - has frequent nitrate responsive CP + WMA on echo  - continue Plavix - no ASA given need for Eliquis  - reduced imdur to 30 mg daily d/t pressor requirement. Remains on ranexa 500 bid - atorva + Zetia    7. H/o Severe AS - s/p TAVR w/ 26 mm S3 in 2018 - stable valve on admit echo w/ mean gradient 9 mmHg. Trivial AI.     Length of Stay: 2  Baudelio Karnes, Dalbert Garnet, PA-C  02/11/2023, 7:34 AM  Advanced Heart Failure Team Pager [phone] (M-F; 7a - 5p)  Please contact [facility] for night-coverage after hours (5p -7a ) and weekends on [email or web address]

## 2023-02-11 NOTE — Plan of Care (Signed)
Problem: Education: Goal: Knowledge of General Education information will improve Description Including pain rating scale, medication(s)/side effects and non-pharmacologic comfort measures Outcome: Progressing

## 2023-02-11 NOTE — Progress Notes (Signed)
Pharmacy Antibiotic Note  SHAHZAIB Barnes is a 78 y.o. male admitted on 02/09/2023 with Flu A + and being worked up for bacteremia.  Increased confusion through the day, BP dropped 90s UA + bacteria, wnc wnl febrile 100.5 and rising lactic acid and Cr 2.5 crcl approx 14ml/min Pharmacy has been consulted for vancomycin and cefepime dosing.  Plan: Vancomycin IV 1000 mg q36h tomorrow morning (36 hours after loading dose of 1250 mg). Est AUC 413.  Cefepime 2gm IV q12h monitor renal function for dosing adjustment  Tamiflu 30mg  every day x5days   Plan to consider de-escalation/discontinuing antibiotics if blood cultures remain negative at 48 hours, continued clinical improvement.  Height: 5\' 10"  (177.8 cm) Weight: 77.6 kg (171 lb 1.2 oz) IBW/kg (Calculated) : 73  Temp (24hrs), Avg:99.4 F (37.4 C), Min:97.9 F (36.6 C), Max:100.1 F (37.8 C)  Recent Labs  Lab 02/09/23 1015 02/10/23 0509 02/10/23 1017 02/10/23 1221 02/10/23 1440 02/10/23 2152 02/11/23 0522 02/11/23 0752  WBC 6.7  --   --   --  8.1  --   --  10.1  CREATININE 2.57* 2.43*  --   --   --   --  2.10*  --   LATICACIDVEN  --   --  2.8* 2.8* 5.9* 1.7  --   --     Estimated Creatinine Clearance: 29.5 mL/min (A) (by C-G formula based on SCr of 2.1 mg/dL (H)).    No Known Allergies  Antimicrobials this admission:   Dose adjustments this admission:   Microbiology results: Bcx 1/29: NG at 24h   Haze Boyden PharmD Candidate 02/11/2023 10:00 AM

## 2023-02-12 DIAGNOSIS — I5043 Acute on chronic combined systolic (congestive) and diastolic (congestive) heart failure: Secondary | ICD-10-CM

## 2023-02-12 DIAGNOSIS — R570 Cardiogenic shock: Secondary | ICD-10-CM | POA: Diagnosis not present

## 2023-02-12 DIAGNOSIS — I480 Paroxysmal atrial fibrillation: Secondary | ICD-10-CM | POA: Diagnosis not present

## 2023-02-12 DIAGNOSIS — N1832 Chronic kidney disease, stage 3b: Secondary | ICD-10-CM

## 2023-02-12 LAB — CBC
HCT: 36.1 % — ABNORMAL LOW (ref 39.0–52.0)
Hemoglobin: 12 g/dL — ABNORMAL LOW (ref 13.0–17.0)
MCH: 28.9 pg (ref 26.0–34.0)
MCHC: 33.2 g/dL (ref 30.0–36.0)
MCV: 87 fL (ref 80.0–100.0)
Platelets: 190 10*3/uL (ref 150–400)
RBC: 4.15 MIL/uL — ABNORMAL LOW (ref 4.22–5.81)
RDW: 15.7 % — ABNORMAL HIGH (ref 11.5–15.5)
WBC: 7.8 10*3/uL (ref 4.0–10.5)
nRBC: 0.4 % — ABNORMAL HIGH (ref 0.0–0.2)

## 2023-02-12 LAB — HEMOGLOBIN A1C
Hgb A1c MFr Bld: 6.4 % — ABNORMAL HIGH (ref 4.8–5.6)
Mean Plasma Glucose: 136.98 mg/dL

## 2023-02-12 LAB — GLUCOSE, CAPILLARY
Glucose-Capillary: 116 mg/dL — ABNORMAL HIGH (ref 70–99)
Glucose-Capillary: 140 mg/dL — ABNORMAL HIGH (ref 70–99)
Glucose-Capillary: 147 mg/dL — ABNORMAL HIGH (ref 70–99)

## 2023-02-12 LAB — BASIC METABOLIC PANEL
Anion gap: 9 (ref 5–15)
Anion gap: 12 (ref 5–15)
BUN: 32 mg/dL — ABNORMAL HIGH (ref 8–23)
BUN: 33 mg/dL — ABNORMAL HIGH (ref 8–23)
CO2: 29 mmol/L (ref 22–32)
CO2: 28 mmol/L (ref 22–32)
Calcium: 8.2 mg/dL — ABNORMAL LOW (ref 8.9–10.3)
Calcium: 8.7 mg/dL — ABNORMAL LOW (ref 8.9–10.3)
Chloride: 93 mmol/L — ABNORMAL LOW (ref 98–111)
Chloride: 94 mmol/L — ABNORMAL LOW (ref 98–111)
Creatinine, Ser: 1.85 mg/dL — ABNORMAL HIGH (ref 0.61–1.24)
Creatinine, Ser: 1.82 mg/dL — ABNORMAL HIGH (ref 0.61–1.24)
GFR, Estimated: 37 mL/min — ABNORMAL LOW (ref >60)
GFR, Estimated: 37 mL/min — ABNORMAL LOW (ref >60)
Glucose, Bld: 222 mg/dL — ABNORMAL HIGH (ref 70–99)
Glucose, Bld: 144 mg/dL — ABNORMAL HIGH (ref 70–99)
Potassium: 3.5 mmol/L (ref 3.5–5.1)
Potassium: 3.6 mmol/L (ref 3.5–5.1)
Sodium: 131 mmol/L — ABNORMAL LOW (ref 135–145)
Sodium: 134 mmol/L — ABNORMAL LOW (ref 135–145)

## 2023-02-12 LAB — COOXEMETRY PANEL
Carboxyhemoglobin: 1.5 % (ref 0.5–1.5)
Methemoglobin: 0.8 % (ref 0.0–1.5)
O2 Saturation: 55.2 %
Total hemoglobin: 12.5 g/dL (ref 12.0–16.0)

## 2023-02-12 LAB — MAGNESIUM: Magnesium: 1.8 mg/dL (ref 1.7–2.4)

## 2023-02-12 MED ORDER — INSULIN ASPART 100 UNIT/ML IJ SOLN: 0.0000 [IU] | Freq: Every day | INTRAMUSCULAR | Status: DC

## 2023-02-12 MED ORDER — INSULIN ASPART 100 UNIT/ML IJ SOLN
0.0000 [IU] | Freq: Three times a day (TID) | INTRAMUSCULAR | Status: DC
Start: 2023-02-12 — End: 2023-02-16
  Administered 2023-02-12 – 2023-02-13 (×3): 1 [IU] via SUBCUTANEOUS

## 2023-02-12 MED ORDER — OSELTAMIVIR PHOSPHATE 30 MG PO CAPS
30.0000 mg | ORAL_CAPSULE | Freq: Two times a day (BID) | ORAL | Status: AC
  Administered 2023-02-12 – 2023-02-14 (×5): 30 mg via ORAL
  Filled 2023-02-12 (×6): qty 1

## 2023-02-12 MED ORDER — MAGNESIUM SULFATE 2 GM/50ML IV SOLN
2.0000 g | Freq: Once | INTRAVENOUS | Status: AC
  Administered 2023-02-12: 2 g via INTRAVENOUS
  Filled 2023-02-12: qty 50

## 2023-02-12 MED ORDER — POTASSIUM CHLORIDE CRYS ER 20 MEQ PO TBCR
40.0000 meq | EXTENDED_RELEASE_TABLET | Freq: Once | ORAL | Status: AC
  Administered 2023-02-12: 40 meq via ORAL
  Filled 2023-02-12: qty 2

## 2023-02-12 NOTE — Evaluation (Signed)
Physical Therapy Evaluation Patient Details Name: [name] MRN: [id number] DOB: 07-25-1945 Today's Date: 02/12/2023  History of Present Illness  Pt is a 77 y/o male presenting 1/28 with dyspnea on exertion and leg swelling. Found to have reduced ejection fraction, NSTEMI and influenza A+. During admission, developed hypotension and signs of cardiogenic shock so transferred to ICU. PMH: HFrEF, CAD, HTN  Clinical Impression  Pt is presenting slightly below baseline level of functioning. Currently pt is Min A - CGA for bed mobility, CGA for sit to stand and Min A for transfers with RW. Due to pt current functional status, home set up and available assistance at home recommending skilled physical therapy services 3x/week in order to address strength, balance and functional mobility to decrease risk for falls, injury and re-hospitalization.           If plan is discharge home, recommend the following: Assist for transportation;Help with stairs or ramp for entrance;Assistance with cooking/housework     Equipment Recommendations None recommended by PT     Functional Status Assessment Patient has had a recent decline in their functional status and demonstrates the ability to make significant improvements in function in a reasonable and predictable amount of time.     Precautions / Restrictions Precautions Precautions: Fall Precaution Comments: Drop precautions, incontinent of bowel Restrictions Weight Bearing Restrictions Per Provider Order: No      Mobility  Bed Mobility Overal bed mobility: Needs Assistance Bed Mobility: Supine to Sit, Rolling Rolling: Contact guard assist   Supine to sit: Min assist     General bed mobility comments: Min A at trunk and minimal verbal cues for sequencing. CGA for rolling to the L    Transfers Overall transfer level: Needs assistance Equipment used: Rolling walker (2 wheels) Transfers: Sit to/from Stand, Bed to chair/wheelchair/BSC Sit to  Stand: Contact guard assist   Step pivot transfers: Contact guard assist       General transfer comment: CGA for stability w/RW.    Ambulation/Gait   Pre-gait activities: pt took steps from EOB to [facility], Min A for balance and navigation AD. No significant deviations.        Balance Overall balance assessment: Needs assistance Sitting-balance support: Single extremity supported, Feet unsupported, Feet supported Sitting balance-Leahy Scale: Good     Standing balance support: Single extremity supported, Bilateral upper extremity supported Standing balance-Leahy Scale: Fair Standing balance comment: no overt LOB. pt is slightly unstable.               Pertinent Vitals/Pain Pain Assessment Pain Assessment: Faces Faces Pain Scale: No hurt Pain Intervention(s): Monitored during session    Home Living Family/patient expects to be discharged to:: Private residence Living Arrangements: Spouse/significant other Available Help at Discharge: Family;Available PRN/intermittently Type of Home: House Home Access: Stairs to enter Entrance Stairs-Rails: None Entrance Stairs-Number of Steps: a few   Home Layout: One level Home Equipment: Agricultural consultant (2 wheels)      Prior Function Prior Level of Function : Independent/Modified Independent         Extremity/Trunk Assessment   Upper Extremity Assessment Upper Extremity Assessment: Defer to OT evaluation    Lower Extremity Assessment Lower Extremity Assessment: Generalized weakness    Cervical / Trunk Assessment Cervical / Trunk Assessment: Kyphotic  Communication   Communication Communication: No apparent difficulties  Cognition Arousal: Alert Behavior During Therapy: WFL for tasks assessed/performed Overall Cognitive Status: Within Functional Limits for tasks assessed        General Comments  General comments (skin integrity, edema, etc.): Pt vital signs remained stable on 4 L o2 during session.         Assessment/Plan    PT Assessment Patient needs continued PT services  PT Problem List Decreased strength;Decreased balance;Decreased activity tolerance       PT Treatment Interventions DME instruction;Therapeutic activities;Gait training;Therapeutic exercise;Stair training;Functional mobility training;Balance training;Patient/family education    PT Goals (Current goals can be found in the Care Plan section)  Acute Rehab PT Goals Patient Stated Goal: to improve strength and return home. PT Goal Formulation: With patient Time For Goal Achievement: 02/26/23 Potential to Achieve Goals: Good    Frequency Min 1X/week     Co-evaluation   Reason for Co-Treatment: To address functional/ADL transfers PT goals addressed during session: Mobility/safety with mobility;Balance;Proper use of DME         AM-PAC PT "6 Clicks" Mobility  Outcome Measure Help needed turning from your back to your side while in a flat bed without using bedrails?: A Little Help needed moving from lying on your back to sitting on the side of a flat bed without using bedrails?: A Little Help needed moving to and from a bed to a chair (including a wheelchair)?: A Little Help needed standing up from a chair using your arms (e.g., wheelchair or bedside chair)?: A Little Help needed to walk in hospital room?: A Little Help needed climbing 3-5 steps with a railing? : A Little 6 Click Score: 18    End of Session   Activity Tolerance: Patient tolerated treatment well Patient left: Other (comment) (on BSC with OT) Nurse Communication: Mobility status PT Visit Diagnosis: Unsteadiness on feet (R26.81);Other abnormalities of gait and mobility (R26.89)    Time: 4098-1191 PT Time Calculation (min) (ACUTE ONLY): 22 min   Charges:   PT Evaluation $PT Eval Low Complexity: 1 Low   PT General Charges $$ ACUTE PT VISIT: 1 Visit         Harrel Carina, DPT, CLT  Acute Rehabilitation Services Office:  [phone] (Secure chat preferred)   Claudia Desanctis 02/12/2023, 12:29 PM

## 2023-02-12 NOTE — Evaluation (Signed)
Occupational Therapy Evaluation Patient Details Name: [name] MRN: [id number] DOB: 1945-10-11 Today's Date: 02/12/2023   History of Present Illness Pt is a 78 y/o male presenting 1/28 with dyspnea on exertion and leg swelling. Found to have reduced ejection fraction, NSTEMI and influenza A+. During admission, developed hypotension and signs of cardiogenic shock so transferred to ICU. PMH: HFrEF, CAD, HTN   Clinical Impression   PTA, pt lives with spouse and typically Independent with ADLs, IADLs and mobility without AD. Pt presents now with deficits in cardiopulmonary endurance, strength and dynamic standing balance. Pt requires Min A for bed mobility and able to progress transfers using RW with CGA. Evaluation somewhat limited by incontinence/loose stools w/ pt requiring assistance for cleanup. Overall, pt requires Setup for UB ADL and Min A for LB ADLs. Anticipate good progress acutely to return home without need for OT follow up. Encouraged pt to use RW and shower chair at home if needed until fully back to baseline.    SpO2 WFL on 4 L O2 and with titration to 2 L O2      If plan is discharge home, recommend the following: A little help with bathing/dressing/bathroom;Assistance with cooking/housework;Help with stairs or ramp for entrance    Functional Status Assessment  Patient has had a recent decline in their functional status and demonstrates the ability to make significant improvements in function in a reasonable and predictable amount of time.  Equipment Recommendations  None recommended by OT    Recommendations for Other Services       Precautions / Restrictions Precautions Precautions: Fall Precaution Comments: Drop precautions, incontinent of bowel Restrictions Weight Bearing Restrictions Per Provider Order: No      Mobility Bed Mobility Overal bed mobility: Needs Assistance Bed Mobility: Supine to Sit, Rolling Rolling: Contact guard assist   Supine to sit:  Min assist     General bed mobility comments: Min A at trunk and minimal verbal cues for sequencing. CGA for rolling to the L    Transfers Overall transfer level: Needs assistance Equipment used: Rolling walker (2 wheels) Transfers: Sit to/from Stand, Bed to chair/wheelchair/BSC Sit to Stand: Contact guard assist     Step pivot transfers: Contact guard assist     General transfer comment: CGA for stability w/RW.      Balance Overall balance assessment: Needs assistance Sitting-balance support: Single extremity supported, Feet unsupported, Feet supported Sitting balance-Leahy Scale: Good     Standing balance support: Single extremity supported, Bilateral upper extremity supported Standing balance-Leahy Scale: Fair                             ADL either performed or assessed with clinical judgement   ADL Overall ADL's : Needs assistance/impaired Eating/Feeding: Independent   Grooming: Set up;Sitting   Upper Body Bathing: Set up;Sitting   Lower Body Bathing: Minimal assistance;Sit to/from stand;Sitting/lateral leans Lower Body Bathing Details (indicate cue type and reason): increased assist required for bathing due to bout of loose stool/incontinence Upper Body Dressing : Set up;Sitting   Lower Body Dressing: Minimal assistance;Sitting/lateral leans;Sit to/from stand Lower Body Dressing Details (indicate cue type and reason): assist for socks Toilet Transfer: Contact guard assist;Stand-pivot;BSC/3in1;Rolling walker (2 wheels) Toilet Transfer Details (indicate cue type and reason): to/from [facility], able to step/turn with assist only for lines Toileting- Clothing Manipulation and Hygiene: Minimal assistance;Sit to/from stand;Sitting/lateral lean Toileting - Clothing Manipulation Details (indicate cue type and reason): assist for hygiene to  minimize mess d/t incontinence/loose stools             Vision Baseline Vision/History: 1 Wears glasses Ability to See  in Adequate Light: 0 Adequate Patient Visual Report: No change from baseline Vision Assessment?: No apparent visual deficits     Perception         Praxis         Pertinent Vitals/Pain Pain Assessment Pain Assessment: No/denies pain     Extremity/Trunk Assessment Upper Extremity Assessment Upper Extremity Assessment: Generalized weakness;Right hand dominant   Lower Extremity Assessment Lower Extremity Assessment: Defer to PT evaluation   Cervical / Trunk Assessment Cervical / Trunk Assessment: Kyphotic   Communication Communication Communication: No apparent difficulties   Cognition Arousal: Alert Behavior During Therapy: WFL for tasks assessed/performed Overall Cognitive Status: Within Functional Limits for tasks assessed                                       General Comments  SpO2 WFL on 4 L O2, able to titrate to 2 L O2 with SpO2 > 92%. reported dizziness but BP WFL    Exercises     Shoulder Instructions      Home Living Family/patient expects to be discharged to:: Private residence Living Arrangements: Spouse/significant other Available Help at Discharge: Family Type of Home: House Home Access: Stairs to enter [employer] of Steps: a few Entrance Stairs-Rails: None Home Layout: One level     Bathroom Shower/Tub: Chief Strategy Officer: Standard     Home Equipment: Agricultural consultant (2 wheels);Shower seat          Prior Functioning/Environment Prior Level of Function : Independent/Modified Independent             Mobility Comments: no AD ADLs Comments: indep with ADLs, IADLs        OT Problem List: Decreased strength;Decreased activity tolerance;Impaired balance (sitting and/or standing)      OT Treatment/Interventions: Therapeutic exercise;Self-care/ADL training;Energy conservation;DME and/or AE instruction;Therapeutic activities;Patient/family education;Balance training    OT Goals(Current goals  can be found in the care plan section) Acute Rehab OT Goals Patient Stated Goal: decrease nausea, feel better soon OT Goal Formulation: With patient Time For Goal Achievement: 02/26/23 Potential to Achieve Goals: Good ADL Goals Pt Will Transfer to Toilet: with modified independence;ambulating Additional ADL Goal #1: Pt to increase standing activity tolerance during ADLs/mobility > 10 min without seated rest break  OT Frequency: Min 1X/week    Co-evaluation PT/OT/SLP Co-Evaluation/Treatment: Yes Reason for Co-Treatment: To address functional/ADL transfers PT goals addressed during session: Mobility/safety with mobility;Balance;Proper use of DME OT goals addressed during session: ADL's and self-care;Proper use of Adaptive equipment and DME      AM-PAC OT "6 Clicks" Daily Activity     Outcome Measure Help from another person eating meals?: None Help from another person taking care of personal grooming?: A Little Help from another person toileting, which includes using toliet, bedpan, or urinal?: A Little Help from another person bathing (including washing, rinsing, drying)?: A Little Help from another person to put on and taking off regular upper body clothing?: A Little Help from another person to put on and taking off regular lower body clothing?: A Little 6 Click Score: 19   End of Session Equipment Utilized During Treatment: Rolling walker (2 wheels);Oxygen Nurse Communication: Mobility status;Other (comment) (loose stools, dizziness and nausea)  Activity Tolerance: Patient tolerated  treatment well Patient left: in chair;with call bell/phone within reach  OT Visit Diagnosis: Unsteadiness on feet (R26.81);Muscle weakness (generalized) (M62.81)                Time: 1610-9604 OT Time Calculation (min): 32 min Charges:  OT General Charges $OT Visit: 1 Visit OT Evaluation $OT Eval Moderate Complexity: 1 Mod  Bradd Canary, OTR/L Acute Rehab Services Office: [phone]   Lorre Munroe 02/12/2023, 12:35 PM

## 2023-02-12 NOTE — Progress Notes (Signed)
[facility] ADULT ICU REPLACEMENT PROTOCOL   The patient does apply for the [facility] Adult ICU Electrolyte Replacment Protocol based on the criteria listed below:   1.Exclusion criteria: TCTS, ECMO, Dialysis, and Myasthenia Gravis patients 2. Is GFR >/= 30 ml/min? Yes.    Patient's GFR today is 37 3. Is SCr </= 2? Yes.   Patient's SCr is 1.85 mg/dL 4. Did SCr increase >/= 0.5 in 24 hours? No. 5.Pt's weight >40kg  Yes.   6. Abnormal electrolyte(s): K+3.5, Mag 1.8  7. Electrolytes replaced per protocol 8.  Call MD STAT for K+ </= 2.5, Phos </= 1, or Mag </= 1 Physician:  [place] [facility] 02/12/2023 5:52 AM

## 2023-02-12 NOTE — Progress Notes (Signed)
NAME:  [name], MRN:  [id number], DOB:  [birth year], LOS: 3 ADMISSION DATE:  02/09/2023 CONSULTATION DATE:  02/10/2023 REFERRING MD:  Anne Fu - Cardiology, CHIEF COMPLAINT: Shock, cardiogenic versus septic   History of Present Illness:  78 year old man who presented to [facility] 1/28 for SOB and leg swelling. PMHx significant for HTN, HLD, CAD (s/p CABG x 7 1996, PCI with DES to SVG-OM2 03/2014, 12/2020) with ischemic CM (s/p BiV-ICD), AS s/p TAVR (2018), HFrEF (Echo 03/2021 with EF ~40% with G3DD), PAF (on Eliquis), LBBB, OSA (not on CPAP), GERD c/b Barrett's esophagus, nephrolithiasis.  History is obtained primarily from chart review. Patient initially contacted his Cardiology office 1/27 complaining of a few days of SOB (both at rest/with activity) and leg swelling; reported weight gain of 5lb over the few days PTA. Additionally, c/o new onset CP and "tightness" with dyspnea. Patient was advised to increase Lasix dose and to call back if no improvement in symptoms. He presented to Cardiology clinic 1/28 with ongoing CP/SOB, significant LE edema and orthopnea. Denied fever/chills, endorsed cough. Denies nausea/vomiting, endorses some diarrhea.   Subsequently was sent from Cardiology clinic to [facility] ED for presumed heart failure exacerbation. On ED arrival, patient was afebrile with HR 88 (AV paced), BP 112/68, RR 24, SpO2 93% on 2L). Noted to be somewhat confused. Labs were notable for BNP 1804. LA 2.8 > 2.8, Flu A+. UA +moderate Hgb, few bacteria. CXR with mild cardiomegaly. Echo completed demonstrating EF  < 20%, +RWMAs, TAVR with perivalvular leak. Admitted under the Cardiology service. AHF consulted 1/29.   On 1/29, patient was noted to be acutely more confused/tearful with plateaued LA and low grade fever with concern for development of shock. PCCM consulted for ICU evaluation and transfer.  Pertinent Medical History:   Past Medical History:  Diagnosis Date   Aortic stenosis    Barrett's  esophageal ulceration    Chronic systolic CHF (congestive heart failure) (HCC)    Coronary artery disease    a. CABG 1996: LIMA graft to the LAD and diagonal, sequential vein graft to the acute marginal, PDA, and posterior lateral branches of the right coronary, sequential saphenous vein graft to the obtuse marginal vessel and distal circumflex. b. Cath 2007 patent grafts. c. s/p DES to SVG-OM2 in 03/2014.   Coronary artery disease involving autologous vein coronary bypass graft with angina pectoris (HCC) 03/13/2014   PCI using DES in SVG to LCx system   Dental caries    pre-heart valve surgery protocol   Gastroesophageal reflux disease    History of erectile dysfunction    History of hiatal hernia    History of kidney stones    History of obesity    History of prostatitis    Hypercholesterolemia    Hypertension    Kidney stones    LBBB (left bundle branch block)    Nodule of kidney 02/04/2016   Incidental 8mm nodule upper pole right kidney noted on CT angiogram - MRI with and without gadolinium contrast recommened in 6 months   OSA (obstructive sleep apnea)    "suppose to wear mask; I don't" (03/13/2014)   PVC's (premature ventricular contractions)    S/P CABG x 7 01/07/1995   LIMA to LAD-diagonal, SVG to OM-LCx, SVG to AM-PD-RPL   S/P TAVR (transcatheter aortic valve replacement) 02/18/2016   26 mm Edwards Sapien 3 transcatheter heart valve placed via percutaneous right transfemoral approach  ' Significant Hospital Events: Including procedures, antibiotic start and stop dates in addition  to other pertinent events   1/28 - Presented to [facility] ED from Cardiology clinic for SOB, LE edema, AECHF. 1/29 - AHF team consulted. Patient increasingly confused with development of LG fever, concern for cardiogenic/septic shock. PCCM consulted. Transferred to ICU.  Interim History / Subjective:  No significant events overnight Breathing still feels short today, otherwise feels ok O2 requirements  remain stable Remains on NE 5, milrinone 0.25 Lasix gtt 10, UOP 4.3L, net negative 1.2L/24H  Objective:  Blood pressure (!) 143/76, pulse 64, temperature 98.3 F (36.8 C), temperature source Oral, resp. rate (!) 24, height 5\' 10"  (1.778 m), weight 78 kg, SpO2 92%. CVP:  [3 mmHg-12 mmHg] 3 mmHg      Intake/Output Summary (Last 24 hours) at 02/12/2023 0752 Last data filed at 02/12/2023 0600 Gross per 24 hour  Intake 3162.34 ml  Output 4350 ml  Net -1187.66 ml   Filed Weights   02/10/23 0421 02/11/23 0500 02/12/23 0449  Weight: 79.7 kg 77.6 kg 78 kg   Physical Examination: General: Acutely ill-appearing elderly man in NAD. Pleasant and conversant. HEENT: [place]/AT, mildly icteric sclera, PERRL, moist mucous membranes. Neuro: Awake, oriented x 4. Responds to verbal stimuli. Following commands consistently. Moves all 4 extremities spontaneously. Strength 5/5 in all 4 extremities.  CV: AV paced + PVCs, +III/VI systolic murmur. PULM: Breathing mildly tachypneic, mildly labored on 4LNC. Lung fields with scattered rhonchi, slightly improved from prior. GI: Soft, nontender, nondistended. Normoactive bowel sounds. Extremities: Bilateral symmetric 1-2+ LE edema noted, wraps in place. Skin: Warm/dry, no rashes.  Resolved Hospital Problem List:    Assessment & Plan:  Undifferentiated shock, cardiogenic versus septic Lactic acidosis, resolving - Goal MAP > 65 - Inotropic support per AHF team, milrinone - Trend CVP, Co-ox via RUE PICC - Levophed discontinued - Trend WBC, fever curve/LA (improved) - PCT 0.23, follow Cx data - Antibiotics discontinued  Biventricular HFrEF with EF < 20% Ischemic cardiomyopathy with BiV-ICD in place CAD with previous CABG x 7, PCI x 2 with DES AS s/p TAVR 2018 HTN HLD Echo completed demonstrating EF  < 20%, +RWMAs, TAVR with perivalvular leak. Previous EF 03/2021 40%. Not on Entresto/Jardiance due to financial constraint. - AHF team primary - Good diuresis  over the last few days, Lasix gtt discontinued per AHF - GDMT as tolerated, no BB given shock - Continue Plavix, statin/Zetia  Paroxysmal Afib CHA2DS2-VASc Score 5. - Cardiac monitoring - Optimize electrolytes for K > 4, Mg > 2 - Eliquis for AC  Influenza A infection, type H3 - Continue Tamiflu - Supplemental O2 support for O2 sat > 90% - Pulmonary hygiene - Follow CXR  AKI on CKD stage 3b - Trend BMP - Replete electrolytes as indicated - Monitor I&Os - Avoid nephrotoxic agents as able - Ensure adequate renal perfusion  Transaminitis Hyperbilirubinemia Suspect due to shock/low-flow state. - Trend LFTs - Consider GI evaluation if not improving  GERD History of Barrett's esophagus - PPI  Best Practice: (right click and "Reselect all SmartList Selections" daily)   Diet/type: Regular consistency (see orders) DVT prophylaxis: DOAC GI prophylaxis: PPI Lines: Central line (PICC placed for Co-ox/CVP trending) Foley:  N/A Code Status:  limited Last date of multidisciplinary goals of care discussion [Pending]  Critical care time: N/A   Faythe Ghee [facility] 02/12/23 7:52 AM  Please see [email or web address] for pager details.  From 7A-7P if no response, please call [phone] After hours, please call ELink [phone]

## 2023-02-12 NOTE — TOC Initial Note (Addendum)
Transition of Care [facility]) - Initial/Assessment Note    Patient Details  Name: [name] MRN: [id number] Date of Birth: August 08, 1945  Transition of [facility]) CM/SW Contact:    Elliot Cousin, RN Phone Number: [phone] 02/12/2023, 5:03 PM  Clinical Narrative:                  HF TOC CM spoke to pt's wife and states she had [facility]. She prefers Advertising copywriter. Has RW and wheelchair. Has financial means to hire caregiver/aide if needed in home. Her son will be here this weekend for a visit and she will discuss with him.  Will continue to follow for dc needs.    Barriers to Discharge: Continued Medical Work up   Patient Goals and CMS Choice   CMS Medicare.gov Compare Post Acute Care list provided to:: Patient Represenative (must comment) Choice offered to / list presented to : Spouse      Expected Discharge Plan and Services   Discharge Planning Services: CM Consult Post Acute Care Choice: Home Health Living arrangements for the past 2 months: Single Family Home                           HH Arranged: PT, RN, OT [facility] Agency: [facility] Date [facility] Agency Contacted: 02/12/23 Time HH Agency Contacted: 1700 Representative spoke with at [facility] Agency: Amy  Prior Living Arrangements/Services Living arrangements for the past 2 months: Single Family Home Lives with:: Spouse Patient language and need for interpreter reviewed:: Yes Do you feel safe going back to the place where you live?: Yes      Need for Family Participation in Patient Care: No (Comment) Care giver support system in place?: Yes (comment) Current home services: DME (wheelchair, shower chair, rolling walker, cane) Criminal Activity/Legal Involvement Pertinent to Current Situation/Hospitalization: No - Comment as needed  Activities of Daily Living   ADL Screening (condition at time of admission) Independently performs ADLs?: Yes (appropriate for developmental age) Is the patient deaf or have  difficulty hearing?: No Does the patient have difficulty seeing, even when wearing glasses/contacts?: No Does the patient have difficulty concentrating, remembering, or making decisions?: No  Permission Sought/Granted Permission sought to share information with : Case Manager, Family Supports, PCP Permission granted to share information with : Yes, Verbal Permission Granted  Share Information with NAME: Tully Mcinturff     Permission granted to share info w Relationship: wife  Permission granted to share info w Contact Information: [phone]  Emotional Assessment Appearance:: Appears stated age         Psych Involvement: No (comment)  Admission diagnosis:  Acute CHF (HCC) [I50.9] Acute respiratory failure with hypoxia (HCC) [J96.01] AKI (acute kidney injury) (HCC) [N17.9] Acute on chronic congestive heart failure, unspecified heart failure type [facility]) [I50.9] Patient Active Problem List   Diagnosis Date Noted   AKI (acute kidney injury) (HCC) 02/10/2023   Acute respiratory failure with hypoxia (HCC) 02/10/2023   Lactic acidosis 02/10/2023   Cardiogenic shock (HCC) 02/10/2023   Acute on chronic congestive heart failure (HCC) 02/09/2023   NSTEMI (non-ST elevated myocardial infarction) (HCC) 12/28/2020   Acute systolic heart failure (HCC) 12/27/2020   Hypertensive crisis 12/27/2020   Biventricular ICD (implantable cardioverter-defibrillator) in place 06/04/2020   S/P TAVR (transcatheter aortic valve replacement) 02/18/2016   Severe aortic stenosis 02/18/2016   Chronic periodontitis 02/05/2016   Nodule of kidney 02/04/2016   Coronary artery disease involving  autologous artery coronary bypass graft with angina pectoris [facility])    Coronary artery disease involving native heart with angina pectoris (HCC)    Syncope 12/24/2014   Bilateral carotid bruits 12/16/2014   Abdominal bruit 12/16/2014   PVC's (premature ventricular contractions)    Chronic systolic CHF (congestive heart  failure) (HCC)    Syncope and collapse versus seizures 12/15/2014   Chronic chest pain 12/15/2014   Aortic stenosis    Coronary artery disease involving coronary bypass graft of native heart with angina pectoris (HCC) 03/13/2014   Unstable angina (HCC) 03/13/2014   Atypical chest pain 02/21/2014   Gallstones 05/16/2013   Barrett's esophagus 05/16/2013   History of nephrolithiasis 05/16/2013   LBBB (left bundle branch block) 02/11/2011   Coronary artery disease    Hypercholesterolemia    History of hypertension    Gastroesophageal reflux disease    History of obesity    S/P CABG x 7 01/07/1995   PCP:  Corrie Mckusick, MD Pharmacy:   [facility] [id number] [place], [place] - [address] [place] [address] Phone: [phone] Fax: [phone]     Social Drivers of Health (SDOH) Social History: SDOH Screenings   Food Insecurity: No Food Insecurity (02/10/2023)  Housing: Low Risk  (02/10/2023)  Transportation Needs: No Transportation Needs (02/10/2023)  Utilities: Not At Risk (02/09/2023)  Social Connections: Moderately Isolated (02/10/2023)  Tobacco Use: Medium Risk (02/09/2023)   SDOH Interventions:     Readmission Risk Interventions     No data to display

## 2023-02-12 NOTE — Progress Notes (Addendum)
Advanced Heart Failure Rounding Note  Cardiologist: Peter Swaziland, MD   Chief Complaint: Mixed cardiogenic/septic shock  Subjective:   01/29: Transferred to the ICU for shock. Started on milrinone + NE.   BP greatly improved, exam as well, feels much better, fever resolved. Will stop norepi. Exam improved as well, so lasix gtt discontinued. Weight not significantly changed, but unsure if bedweight.    Objective:   Weight Range: 78 kg Body mass index is 24.67 kg/m.   Vital Signs:   Temp:  [97.9 F (36.6 C)-98.6 F (37 C)] 97.9 F (36.6 C) (01/31 0830) Pulse Rate:  [52-80] 74 (01/31 1000) Resp:  [16-33] 26 (01/31 1000) BP: (120-158)/(63-126) 147/80 (01/31 1000) SpO2:  [92 %-96 %] 92 % (01/31 1000) Weight:  [78 kg] 78 kg (01/31 0449) Last BM Date : 02/12/23  Weight change: Filed Weights   02/10/23 0421 02/11/23 0500 02/12/23 0449  Weight: 79.7 kg 77.6 kg 78 kg    Intake/Output:   Intake/Output Summary (Last 24 hours) at 02/12/2023 1041 Last data filed at 02/12/2023 1005 Gross per 24 hour  Intake 2664.27 ml  Output 4200 ml  Net -1535.73 ml     CVP 11 Physical Exam    General:  chronically ill appearing. Lying in bed. No distress. HEENT: Normal Neck: Supple. JVP 10-12.  Cor: Regular rate & rhythm. No rubs, gallops or murmurs. Lungs: B/l rhonchi. Comfortable on 4L [place]. Abdomen: Soft, nontender, nondistended.  Extremities: No cyanosis, clubbing, rash, trace edema, + UNNA boots Neuro: Alert & orientedx3. Affect pleasant   Telemetry   V paced 60s, PVCs  EKG    No new  Labs    CBC Recent Labs    02/11/23 0752 02/12/23 0444  WBC 10.1 7.8  HGB 11.3* 12.0*  HCT 35.0* 36.1*  MCV 88.8 87.0  PLT 171 190   Basic Metabolic Panel Recent Labs    86/57/84 0522 02/11/23 1340 02/12/23 0444  NA 134* 133* 131*  K 2.7* 3.1* 3.5  CL 93* 93* 93*  CO2 30 29 29   GLUCOSE 211* 220* 222*  BUN 43* 38* 32*  CREATININE 2.10* 1.92* 1.85*  CALCIUM 8.0* 8.1*  8.2*  MG 2.0  --  1.8   Liver Function Tests Recent Labs    02/11/23 0522  AST 654*  ALT 580*  ALKPHOS 64  BILITOT 3.1*  PROT 5.3*  ALBUMIN 2.6*   No results for input(s): "LIPASE", "AMYLASE" in the last 72 hours. Cardiac Enzymes No results for input(s): "CKTOTAL", "CKMB", "CKMBINDEX", "TROPONINI" in the last 72 hours.  BNP: BNP (last 3 results) Recent Labs    02/10/23 0509  BNP 1,804.8*    ProBNP (last 3 results) No results for input(s): "PROBNP" in the last 8760 hours.   D-Dimer No results for input(s): "DDIMER" in the last 72 hours. Hemoglobin A1C No results for input(s): "HGBA1C" in the last 72 hours. Fasting Lipid Panel No results for input(s): "CHOL", "HDL", "LDLCALC", "TRIG", "CHOLHDL", "LDLDIRECT" in the last 72 hours. Thyroid Function Tests No results for input(s): "TSH", "T4TOTAL", "T3FREE", "THYROIDAB" in the last 72 hours.  Invalid input(s): "FREET3"  Other results:     Medications:     Scheduled Medications:  apixaban  5 mg Oral BID   atorvastatin  80 mg Oral Daily   Chlorhexidine Gluconate Cloth  6 each Topical Daily   clopidogrel  75 mg Oral Daily   ezetimibe  10 mg Oral Daily   finasteride  5 mg Oral Daily  insulin aspart  0-5 Units Subcutaneous QHS   insulin aspart  0-9 Units Subcutaneous TID WC   isosorbide mononitrate  30 mg Oral Daily   oseltamivir  30 mg Oral Daily   pantoprazole  40 mg Oral Daily   ranolazine  500 mg Oral BID   sodium chloride flush  10-40 mL Intracatheter Q12H   sodium chloride flush  3 mL Intravenous Q12H   spironolactone  25 mg Oral Daily    Infusions:  ceFEPime (MAXIPIME) IV Stopped (02/12/23 0601)   milrinone 0.25 mcg/kg/min (02/12/23 1005)    PRN Medications: acetaminophen, albuterol, ondansetron (ZOFRAN) IV, mouth rinse, sodium chloride flush, sodium chloride flush    Patient Profile   78 y/o male w/ CAD s/p CABG in 1996 and subsequent stenting, LBBB, chronic systolic CHF s/p BiV ICD, low  gradient, low flow severe AS s/p TAVR (02/2016).     Admitted w/ a/c BiV CHF w/ marked volume overload and low output. Influenza A +  Assessment/Plan  Mixed septic/cardiogenic shock -Transferred to ICU 01/29 with AMS and lactic acidosis. -Treated with Tamiflu for flu A and empiric abx (vanc + cefepime) for suspected superimposed PNA -Lactic acid peaked at 5.9>>cleared -Remains on milrinone 0.25 mcg/kg/min + NE 5 mcg/min. CO-OX 55% this AM - Stop norepi given greatly improved MAP -Can hopefully transition back to the floor tomorrow  2. Acute on chronic Biventricular Systolic Heart Failure  - h/o ischemic CM + Known LBBB s/p CRT-D  - EF has been as low as 30-35% in the past. - Echo 2019 EF improved to 40-45%.   - Echo in 2023 EF 50-55%, RV mildly reduced, mild LVH, GIIIDD (restrictive) - -Echo with EF < 20%, RWMA, RV mildly reduced, moderate to severe TR - Now admitted w/ NYHA Class IIIb symptoms and shock as above. - Can transition to IV push lasix as exam significantly improved - Continu spiro at 25 mg daily - Imdur reduced from 120 mg to 30 mg daily - Potential ARB if BP still elevated off norepi - Etiology for drop in EF uncertain. ? If 2/2 flu A/PNA. Plan to recheck echo once he begins to recover. If EF remains reduced, may need to consider ischemic workup vs r/o infiltrative process given hx conduction system disease and AS.   3. AKI on CKD IIIb - B/l SCr ~1.7 - Cardiorenal/low-output - SCr 2.6 on admit>>improved to 1.9 today   4. Shock Liver - Improving. Continue to follow LFTs   5. PAF - SR this admit. Last Afib detection on device in 08/24. - on Eliquis    6. CAD - s/p remote CABG in 1996 - last cath 2022: patent LIMA-LAD, occluded SVG-OM3 w/ collaterals, 95% pSVG-PDA treated w/ DES  - has frequent nitrate responsive CP + WMA on echo  - continue Plavix - no ASA given need for Eliquis  - reduced imdur to 30 mg daily d/t pressor requirement. Remains on ranexa 500  bid - atorva + Zetia    7. H/o Severe AS - s/p TAVR w/ 26 mm S3 in 2018 - stable valve on admit echo w/ mean gradient 9 mmHg. Trivial AI.     Length of Stay: 3  Romie Minus, MD  02/12/2023, 10:41 AM  Advanced Heart Failure Team Pager [phone] (M-F; 7a - 5p)  Please contact [facility] for night-coverage after hours (5p -7a ) and weekends on [email or web address]  CRITICAL CARE Performed by: Romie Minus   Total critical care time: 35 minutes  Critical care time was exclusive of separately billable procedures and treating other patients.  Critical care was necessary to treat or prevent imminent or life-threatening deterioration.  Critical care was time spent personally by me on the following activities: development of treatment plan with patient and/or surrogate as well as nursing, discussions with consultants, evaluation of patient's response to treatment, examination of patient, obtaining history from patient or surrogate, ordering and performing treatments and interventions, ordering and review of laboratory studies, ordering and review of radiographic studies, pulse oximetry and re-evaluation of patient's condition.

## 2023-02-13 ENCOUNTER — Other Ambulatory Visit: Payer: Self-pay | Admitting: Cardiology

## 2023-02-13 DIAGNOSIS — R570 Cardiogenic shock: Secondary | ICD-10-CM | POA: Diagnosis not present

## 2023-02-13 LAB — BASIC METABOLIC PANEL
Anion gap: 13 (ref 5–15)
BUN: 36 mg/dL — ABNORMAL HIGH (ref 8–23)
CO2: 27 mmol/L (ref 22–32)
Calcium: 8.8 mg/dL — ABNORMAL LOW (ref 8.9–10.3)
Chloride: 94 mmol/L — ABNORMAL LOW (ref 98–111)
Creatinine, Ser: 1.97 mg/dL — ABNORMAL HIGH (ref 0.61–1.24)
GFR, Estimated: 34 mL/min — ABNORMAL LOW (ref >60)
Glucose, Bld: 156 mg/dL — ABNORMAL HIGH (ref 70–99)
Potassium: 3.8 mmol/L (ref 3.5–5.1)
Sodium: 134 mmol/L — ABNORMAL LOW (ref 135–145)

## 2023-02-13 LAB — GLUCOSE, CAPILLARY
Glucose-Capillary: 133 mg/dL — ABNORMAL HIGH (ref 70–99)
Glucose-Capillary: 150 mg/dL — ABNORMAL HIGH (ref 70–99)
Glucose-Capillary: 128 mg/dL — ABNORMAL HIGH (ref 70–99)
Glucose-Capillary: 110 mg/dL — ABNORMAL HIGH (ref 70–99)
Glucose-Capillary: 107 mg/dL — ABNORMAL HIGH (ref 70–99)

## 2023-02-13 LAB — COOXEMETRY PANEL
Carboxyhemoglobin: 1.9 % — ABNORMAL HIGH (ref 0.5–1.5)
Methemoglobin: 1 % (ref 0.0–1.5)
O2 Saturation: 64.5 %
Total hemoglobin: 9.1 g/dL — ABNORMAL LOW (ref 12.0–16.0)

## 2023-02-13 LAB — MAGNESIUM: Magnesium: 2.2 mg/dL (ref 1.7–2.4)

## 2023-02-13 MED ORDER — LOSARTAN POTASSIUM 25 MG PO TABS
25.0000 mg | ORAL_TABLET | Freq: Every day | ORAL | Status: DC
  Administered 2023-02-13 – 2023-02-15 (×3): 25 mg via ORAL
  Filled 2023-02-13 (×4): qty 1

## 2023-02-13 MED ORDER — MILRINONE LACTATE IN DEXTROSE 20-5 MG/100ML-% IV SOLN
0.1250 ug/kg/min | INTRAVENOUS | Status: DC
Start: 2023-02-13 — End: 2023-02-13
  Administered 2023-02-13: 0.125 ug/kg/min via INTRAVENOUS

## 2023-02-13 MED ORDER — TORSEMIDE 20 MG PO TABS
20.0000 mg | ORAL_TABLET | Freq: Every day | ORAL | Status: DC
  Administered 2023-02-13 – 2023-02-14 (×2): 20 mg via ORAL
  Filled 2023-02-13 (×2): qty 1

## 2023-02-13 NOTE — Progress Notes (Signed)
Patient remained drowsy and less interactive throughout the day, has poor oral intake, encouraged to eat and drink, snacks and drinks offered.  He was incontinent with type 6 stools twice, denied abdominal pain and discomfort.   Encouraged for mobility, No any complains,

## 2023-02-13 NOTE — Progress Notes (Signed)
Advanced Heart Failure Rounding Note  Cardiologist: Peter Swaziland, MD   Chief Complaint: Mixed cardiogenic/septic shock  Subjective:   01/29: Transferred to the ICU for shock. Started on milrinone + NE.   Transferred out of the ICU, exam improving. Coox 64.5 this morning. Will start oral diuresis.    Objective:   Weight Range: 78 kg Body mass index is 24.67 kg/m.   Vital Signs:   Temp:  [97.6 F (36.4 C)-98.1 F (36.7 C)] 97.7 F (36.5 C) (02/01 1154) Pulse Rate:  [56-72] 68 (02/01 1154) Resp:  [17-26] 18 (02/01 1154) BP: (105-151)/(53-89) 105/53 (02/01 1154) SpO2:  [88 %-97 %] 97 % (02/01 1154) Last BM Date : 02/13/23  Weight change: Filed Weights   02/10/23 0421 02/11/23 0500 02/12/23 0449  Weight: 79.7 kg 77.6 kg 78 kg    Intake/Output:   Intake/Output Summary (Last 24 hours) at 02/13/2023 1311 Last data filed at 02/13/2023 1000 Gross per 24 hour  Intake 557.58 ml  Output 400 ml  Net 157.58 ml     CVP 11 Physical Exam    General:  chronically ill appearing. Lying in bed. No distress. HEENT: Normal Neck: Supple. JVP flat  Cor: Regular rate & rhythm. No rubs, gallops or murmurs. Lungs: B/l rhonchi. Comfortable on 4L [place]. Abdomen: Soft, nontender, nondistended.  Extremities: No cyanosis, clubbing, rash, trace edema, + UNNA boots Neuro: Alert & orientedx3. Affect pleasant   Telemetry   V paced 60s, PVCs  EKG    No new  Labs    CBC Recent Labs    02/11/23 0752 02/12/23 0444  WBC 10.1 7.8  HGB 11.3* 12.0*  HCT 35.0* 36.1*  MCV 88.8 87.0  PLT 171 190   Basic Metabolic Panel Recent Labs    19/14/78 0444 02/12/23 1729 02/13/23 1025  NA 131* 134* 134*  K 3.5 3.6 3.8  CL 93* 94* 94*  CO2 29 28 27   GLUCOSE 222* 144* 156*  BUN 32* 33* 36*  CREATININE 1.85* 1.82* 1.97*  CALCIUM 8.2* 8.7* 8.8*  MG 1.8  --  2.2   Liver Function Tests Recent Labs    02/11/23 0522  AST 654*  ALT 580*  ALKPHOS 64  BILITOT 3.1*  PROT 5.3*   ALBUMIN 2.6*   No results for input(s): "LIPASE", "AMYLASE" in the last 72 hours. Cardiac Enzymes No results for input(s): "CKTOTAL", "CKMB", "CKMBINDEX", "TROPONINI" in the last 72 hours.  BNP: BNP (last 3 results) Recent Labs    02/10/23 0509  BNP 1,804.8*    ProBNP (last 3 results) No results for input(s): "PROBNP" in the last 8760 hours.   D-Dimer No results for input(s): "DDIMER" in the last 72 hours. Hemoglobin A1C Recent Labs    02/12/23 1004  HGBA1C 6.4*   Fasting Lipid Panel No results for input(s): "CHOL", "HDL", "LDLCALC", "TRIG", "CHOLHDL", "LDLDIRECT" in the last 72 hours. Thyroid Function Tests No results for input(s): "TSH", "T4TOTAL", "T3FREE", "THYROIDAB" in the last 72 hours.  Invalid input(s): "FREET3"  Other results:     Medications:     Scheduled Medications:  apixaban  5 mg Oral BID   atorvastatin  80 mg Oral Daily   Chlorhexidine Gluconate Cloth  6 each Topical Daily   clopidogrel  75 mg Oral Daily   ezetimibe  10 mg Oral Daily   finasteride  5 mg Oral Daily   insulin aspart  0-5 Units Subcutaneous QHS   insulin aspart  0-9 Units Subcutaneous TID WC   isosorbide  mononitrate  30 mg Oral Daily   losartan  25 mg Oral Daily   oseltamivir  30 mg Oral BID   pantoprazole  40 mg Oral Daily   ranolazine  500 mg Oral BID   sodium chloride flush  10-40 mL Intracatheter Q12H   sodium chloride flush  3 mL Intravenous Q12H   spironolactone  25 mg Oral Daily   torsemide  20 mg Oral Daily    Infusions:    PRN Medications: acetaminophen, albuterol, ondansetron (ZOFRAN) IV, mouth rinse, sodium chloride flush, sodium chloride flush    Patient Profile   78 y/o male w/ CAD s/p CABG in 1996 and subsequent stenting, LBBB, chronic systolic CHF s/p BiV ICD, low gradient, low flow severe AS s/p TAVR (02/2016).     Admitted w/ a/c BiV CHF w/ marked volume overload and low output. Influenza A +  Assessment/Plan  Mixed septic/cardiogenic shock,  resolved -Transferred to ICU 01/29 with AMS and lactic acidosis. -Treated with Tamiflu for flu A and empiric abx (vanc + cefepime) for suspected superimposed PNA -Lactic acid peaked at 5.9>>cleared -Coox 64 on milrinone today, will stop and reassess tomorrow  2. Acute on chronic Biventricular Systolic Heart Failure  - h/o ischemic CM + Known LBBB s/p CRT-D  - EF has been as low as 30-35% in the past. - Echo 2019 EF improved to 40-45%.   - Echo in 2023 EF 50-55%, RV mildly reduced, mild LVH, GIIIDD (restrictive) - -Echo with EF < 20%, RWMA, RV mildly reduced, moderate to severe TR - Now admitted w/ NYHA Class IIIb symptoms and shock as above. - Transition to oral diuretics given significant improvement in exam - Continu spiro at 25 mg daily - Imdur 30 mg daily - Start losartan 25mg  daily - Etiology for drop in EF uncertain. ? If 2/2 flu A/PNA. Plan to recheck echo once he begins to recover. If EF remains reduced, may need to consider ischemic workup vs r/o infiltrative process given hx conduction system disease and AS.   3. AKI on CKD IIIb - B/l SCr ~1.7 - Cardiorenal/low-output - SCr 2.6 on admit>>improved to 1.9 today   4. Shock Liver - Improving. Continue to follow LFTs   5. PAF - SR this admit. Last Afib detection on device in 08/24. - on Eliquis    6. CAD - s/p remote CABG in 1996 - last cath 2022: patent LIMA-LAD, occluded SVG-OM3 w/ collaterals, 95% pSVG-PDA treated w/ DES  - has frequent nitrate responsive CP + WMA on echo  - continue Plavix - no ASA given need for Eliquis  - reduced imdur to 30 mg daily Remains on ranexa 500 bid - atorva + Zetia    7. H/o Severe AS - s/p TAVR w/ 26 mm S3 in 2018 - stable valve on admit echo w/ mean gradient 9 mmHg. Trivial AI.     Length of Stay: 4  Romie Minus, MD  02/13/2023, 1:11 PM  Advanced Heart Failure Team Pager [phone] (M-F; 7a - 5p)  Please contact [facility] for night-coverage after hours (5p -7a ) and  weekends on [email or web address]

## 2023-02-13 NOTE — Plan of Care (Signed)
  Problem: Clinical Measurements: Goal: Respiratory complications will improve Outcome: Progressing   Problem: Clinical Measurements: Goal: Diagnostic test results will improve Outcome: Progressing   Problem: Clinical Measurements: Goal: Will remain free from infection Outcome: Progressing   Problem: Coping: Goal: Level of anxiety will decrease Outcome: Progressing   Problem: Nutrition: Goal: Adequate nutrition will be maintained Outcome: Progressing

## 2023-02-14 DIAGNOSIS — R570 Cardiogenic shock: Secondary | ICD-10-CM | POA: Diagnosis not present

## 2023-02-14 LAB — GLUCOSE, CAPILLARY
Glucose-Capillary: 84 mg/dL (ref 70–99)
Glucose-Capillary: 99 mg/dL (ref 70–99)
Glucose-Capillary: 112 mg/dL — ABNORMAL HIGH (ref 70–99)
Glucose-Capillary: 94 mg/dL (ref 70–99)

## 2023-02-14 LAB — BASIC METABOLIC PANEL
Anion gap: 11 (ref 5–15)
BUN: 33 mg/dL — ABNORMAL HIGH (ref 8–23)
CO2: 29 mmol/L (ref 22–32)
Calcium: 8.7 mg/dL — ABNORMAL LOW (ref 8.9–10.3)
Chloride: 94 mmol/L — ABNORMAL LOW (ref 98–111)
Creatinine, Ser: 1.85 mg/dL — ABNORMAL HIGH (ref 0.61–1.24)
GFR, Estimated: 37 mL/min — ABNORMAL LOW (ref >60)
Glucose, Bld: 95 mg/dL (ref 70–99)
Potassium: 3.7 mmol/L (ref 3.5–5.1)
Sodium: 134 mmol/L — ABNORMAL LOW (ref 135–145)

## 2023-02-14 LAB — COOXEMETRY PANEL
Carboxyhemoglobin: 1.5 % (ref 0.5–1.5)
Methemoglobin: 0.8 % (ref 0.0–1.5)
O2 Saturation: 51.7 %
Total hemoglobin: 12.3 g/dL (ref 12.0–16.0)

## 2023-02-14 LAB — MAGNESIUM: Magnesium: 2.1 mg/dL (ref 1.7–2.4)

## 2023-02-14 MED ORDER — FUROSEMIDE 10 MG/ML IJ SOLN
80.0000 mg | Freq: Once | INTRAMUSCULAR | Status: AC
  Administered 2023-02-14: 80 mg via INTRAVENOUS
  Filled 2023-02-14: qty 8

## 2023-02-14 MED ORDER — MILRINONE LACTATE IN DEXTROSE 20-5 MG/100ML-% IV SOLN
0.1250 ug/kg/min | INTRAVENOUS | Status: DC
  Administered 2023-02-14 – 2023-02-15 (×2): 0.125 ug/kg/min via INTRAVENOUS
  Filled 2023-02-14 (×2): qty 100

## 2023-02-14 NOTE — Plan of Care (Signed)
  Problem: Clinical Measurements: Goal: Will remain free from infection Outcome: Progressing   Problem: Clinical Measurements: Goal: Respiratory complications will improve Outcome: Progressing   Problem: Coping: Goal: Level of anxiety will decrease Outcome: Progressing   Problem: Skin Integrity: Goal: Risk for impaired skin integrity will decrease Outcome: Progressing   Problem: Coping: Goal: Ability to adjust to condition or change in health will improve Outcome: Progressing

## 2023-02-14 NOTE — Progress Notes (Signed)
Patient complaint of nausea early in the morning, PRN meds given.  Tried to turn down 02 via [place] from 3ltrs to [place] but patient was more tachypneic and dyspneic so put him on 3ltrs 02 again.   Encouraged to eat and drink but he didn't eat his meals, had only few sips of water.   MD was at bedside, meds given as per the order.

## 2023-02-14 NOTE — TOC Progression Note (Signed)
Transition of Care [facility]) - Progression Note    Patient Details  Name: [name] MRN: [id number] Date of Birth: 06-19-1945  Transition of Care [facility]) CM/SW Contact  Ronny Bacon, RN Phone Number: 02/14/2023, 11:50 AM  Clinical Narrative:   Secure message from floor nurse regarding family requesting SNF placement due to wife unable to care for patient at home. Request for PT/OT eval and recommendations made to provider. CSW added to secure message.      Barriers to Discharge: Continued Medical Work up  Expected Discharge Plan and Services   Discharge Planning Services: CM Consult Post Acute Care Choice: Home Health Living arrangements for the past 2 months: Single Family Home                           HH Arranged: PT, RN, OT [facility] Agency: [facility] Date [facility] Agency Contacted: 02/12/23 Time HH Agency Contacted: 1700 Representative spoke with at [facility] Agency: Amy   Social Determinants of Health (SDOH) Interventions SDOH Screenings   Food Insecurity: No Food Insecurity (02/10/2023)  Housing: Low Risk  (02/10/2023)  Transportation Needs: No Transportation Needs (02/10/2023)  Utilities: Not At Risk (02/09/2023)  Social Connections: Moderately Isolated (02/10/2023)  Tobacco Use: Medium Risk (02/09/2023)    Readmission Risk Interventions     No data to display

## 2023-02-14 NOTE — Progress Notes (Signed)
Called patient's wife to give her an update about pt's status as per her request, she wanted to talk with the MD, MD notified and she also mentioned that she wouldn't be able to take care of him because of her health status, and she requested for possible SNF placement for him, case manager notified.

## 2023-02-14 NOTE — Progress Notes (Signed)
Physical Therapy Treatment Patient Details Name: [name] MRN: [id number] DOB: 1945/10/15 Today's Date: 02/14/2023   History of Present Illness Pt is a 78 y/o male presenting 1/28 with dyspnea on exertion and leg swelling. Found to have reduced ejection fraction, NSTEMI and influenza A+. During admission, developed hypotension and signs of cardiogenic shock so transferred to ICU. PMH: HFrEF, CAD, HTN    PT Comments  Pt with a decline in function and activity tolerance since PT eval. Per pt his wife had a recent hip/wrist fx so will not be able to provide adequate support at home. Patient will benefit from continued inpatient follow up therapy, <3 hours/day.     If plan is discharge home, recommend the following: A little help with walking and/or transfers;A little help with bathing/dressing/bathroom;Assistance with cooking/housework;Assist for transportation;Help with stairs or ramp for entrance   Can travel by private vehicle     Yes  Equipment Recommendations  None recommended by PT    Recommendations for Other Services       Precautions / Restrictions Precautions Precautions: Fall Precaution Comments: Drop precautions, incontinent of bowel Restrictions Weight Bearing Restrictions Per Provider Order: No     Mobility  Bed Mobility Overal bed mobility: Needs Assistance Bed Mobility: Supine to Sit     Supine to sit: Min assist     General bed mobility comments: Assist to elevate trunk into sitting    Transfers Overall transfer level: Needs assistance Equipment used: Rolling walker (2 wheels) Transfers: Sit to/from Stand Sit to Stand: Min assist           General transfer comment: Assist to power up and stabilize    Ambulation/Gait Ambulation/Gait assistance: Min assist Gait Distance (Feet): 25 Feet Assistive device: Rolling walker (2 wheels) Gait Pattern/deviations: Step-through pattern, Decreased step length - right, Decreased step length - left,  Decreased stride length Gait velocity: decr Gait velocity interpretation: <1.8 ft/sec, indicate of risk for recurrent falls   General Gait Details: Assist for balance and support   Stairs             Wheelchair Mobility     Tilt Bed    Modified Rankin (Stroke Patients Only)       Balance Overall balance assessment: Needs assistance Sitting-balance support: Single extremity supported, Feet unsupported, Feet supported Sitting balance-Leahy Scale: Good     Standing balance support: Single extremity supported, Bilateral upper extremity supported Standing balance-Leahy Scale: Fair                              Cognition Arousal: Alert Behavior During Therapy: WFL for tasks assessed/performed Overall Cognitive Status: Within Functional Limits for tasks assessed                                          Exercises      General Comments General comments (skin integrity, edema, etc.): SpO2 86% on 4L with amb and 92-94% at rest on 4L      Pertinent Vitals/Pain      Home Living                          Prior Function            PT Goals (current goals can now be found in the care plan section) Progress  towards PT goals: Not progressing toward goals - comment    Frequency    Min 1X/week      PT Plan      Co-evaluation              AM-PAC PT "6 Clicks" Mobility   Outcome Measure  Help needed turning from your back to your side while in a flat bed without using bedrails?: A Little Help needed moving from lying on your back to sitting on the side of a flat bed without using bedrails?: A Little Help needed moving to and from a bed to a chair (including a wheelchair)?: A Little Help needed standing up from a chair using your arms (e.g., wheelchair or bedside chair)?: A Little Help needed to walk in hospital room?: A Little Help needed climbing 3-5 steps with a railing? : A Lot 6 Click Score: 17    End of  Session Equipment Utilized During Treatment: Oxygen Activity Tolerance: Patient limited by fatigue Patient left: in bed;with call bell/phone within reach;with bed alarm set (sitting EOB) Nurse Communication: Mobility status;Other (comment) (Pt sitting EOB eating lunch and bed alarm on) PT Visit Diagnosis: Unsteadiness on feet (R26.81);Other abnormalities of gait and mobility (R26.89);Muscle weakness (generalized) (M62.81)     Time: 1610-9604 PT Time Calculation (min) (ACUTE ONLY): 28 min  Charges:    $Gait Training: 23-37 mins PT General Charges $$ ACUTE PT VISIT: 1 Visit                     [facility] PT Acute Rehabilitation Services Office [phone]    Angelina Ok [facility] 02/14/2023, 4:09 PM

## 2023-02-14 NOTE — Progress Notes (Signed)
Advanced Heart Failure Rounding Note  Cardiologist: Peter Swaziland, MD   Chief Complaint: Mixed cardiogenic/septic shock  Subjective:   01/29: Transferred to the ICU for shock. Started on milrinone + NE. 2/1 Milrinone weaned off  Worsening nausea, respiratory status off of milrinone this morning. Wife does not believe that she can take care of him at home, may require SNF placement.   Concerned that his HF seems to be progressing and symptoms may worsen now that SVR improving post influenza. Not an advanced therapies candidate, may need hospice discussions if unable to wean off milrinone.     Objective:   Weight Range: 78 kg Body mass index is 24.67 kg/m.   Vital Signs:   Temp:  [97.6 F (36.4 C)-98.2 F (36.8 C)] 98 F (36.7 C) (02/02 1137) Pulse Rate:  [58-88] 84 (02/02 1137) Resp:  [17-26] 26 (02/02 1137) BP: (114-130)/(54-93) 118/93 (02/02 1137) SpO2:  [91 %-98 %] 93 % (02/02 1137) Last BM Date : 02/13/23  Weight change: Filed Weights   02/10/23 0421 02/11/23 0500 02/12/23 0449  Weight: 79.7 kg 77.6 kg 78 kg    Intake/Output:   Intake/Output Summary (Last 24 hours) at 02/14/2023 1350 Last data filed at 02/14/2023 0900 Gross per 24 hour  Intake 200 ml  Output 750 ml  Net -550 ml     CVP 11 Physical Exam    General:  chronically ill appearing. Lying in bed. Neck: Supple. JVP flat  Lungs: Increased respiratory rate. B/l rhonchi.  Abdomen: Soft, nontender, nondistended.  Extremities: No cyanosis, clubbing, rash, trace edema, + UNNA boots Neuro: Alert & orientedx3. Affect pleasant   Telemetry   V paced 60s, PVCs  EKG    No new  Labs    CBC Recent Labs    02/12/23 0444  WBC 7.8  HGB 12.0*  HCT 36.1*  MCV 87.0  PLT 190   Basic Metabolic Panel Recent Labs    40/10/27 1025 02/14/23 0530  NA 134* 134*  K 3.8 3.7  CL 94* 94*  CO2 27 29  GLUCOSE 156* 95  BUN 36* 33*  CREATININE 1.97* 1.85*  CALCIUM 8.8* 8.7*  MG 2.2 2.1   Liver  Function Tests No results for input(s): "AST", "ALT", "ALKPHOS", "BILITOT", "PROT", "ALBUMIN" in the last 72 hours.  No results for input(s): "LIPASE", "AMYLASE" in the last 72 hours. Cardiac Enzymes No results for input(s): "CKTOTAL", "CKMB", "CKMBINDEX", "TROPONINI" in the last 72 hours.  BNP: BNP (last 3 results) Recent Labs    02/10/23 0509  BNP 1,804.8*    ProBNP (last 3 results) No results for input(s): "PROBNP" in the last 8760 hours.   D-Dimer No results for input(s): "DDIMER" in the last 72 hours. Hemoglobin A1C Recent Labs    02/12/23 1004  HGBA1C 6.4*   Fasting Lipid Panel No results for input(s): "CHOL", "HDL", "LDLCALC", "TRIG", "CHOLHDL", "LDLDIRECT" in the last 72 hours. Thyroid Function Tests No results for input(s): "TSH", "T4TOTAL", "T3FREE", "THYROIDAB" in the last 72 hours.  Invalid input(s): "FREET3"  Other results:     Medications:     Scheduled Medications:  apixaban  5 mg Oral BID   atorvastatin  80 mg Oral Daily   Chlorhexidine Gluconate Cloth  6 each Topical Daily   clopidogrel  75 mg Oral Daily   ezetimibe  10 mg Oral Daily   finasteride  5 mg Oral Daily   insulin aspart  0-5 Units Subcutaneous QHS   insulin aspart  0-9 Units Subcutaneous  TID WC   isosorbide mononitrate  30 mg Oral Daily   losartan  25 mg Oral Daily   oseltamivir  30 mg Oral BID   pantoprazole  40 mg Oral Daily   ranolazine  500 mg Oral BID   sodium chloride flush  10-40 mL Intracatheter Q12H   sodium chloride flush  3 mL Intravenous Q12H   spironolactone  25 mg Oral Daily   torsemide  20 mg Oral Daily    Infusions:  milrinone 0.125 mcg/kg/min (02/14/23 1248)     PRN Medications: acetaminophen, albuterol, ondansetron (ZOFRAN) IV, mouth rinse, sodium chloride flush, sodium chloride flush    Patient Profile   78 y/o male w/ CAD s/p CABG in 1996 and subsequent stenting, LBBB, chronic systolic CHF s/p BiV ICD, low gradient, low flow severe AS s/p TAVR  (02/2016).     Admitted w/ a/c BiV CHF w/ marked volume overload and low output. Influenza A +  Assessment/Plan  Mixed septic/cardiogenic shock -Transferred to ICU 01/29 with AMS and lactic acidosis. -Treated with Tamiflu for flu A and empiric abx (vanc + cefepime) for suspected superimposed PNA -Lactic acid peaked at 5.9>>cleared -Coox 51.7 off milrinone today, will restart given clinical worsening. Poor long term prognosis, may need to involve palliative care team. Wife unable to help care for him at home. Discussed with patient today  2. Acute on chronic Biventricular Systolic Heart Failure  - h/o ischemic CM + Known LBBB s/p CRT-D  - EF has been as low as 30-35% in the past. - Echo 2019 EF improved to 40-45%.   - Echo in 2023 EF 50-55%, RV mildly reduced, mild LVH, GIIIDD (restrictive) - -Echo with EF < 20%, RWMA, RV mildly reduced, moderate to severe TR - Now admitted w/ NYHA Class IIIb symptoms and shock as above. - IV lasix 80mg  IV x1 and milrinone as above - Continu spiro at 25 mg daily - Imdur 30 mg daily - Continue losartan 25mg  daily   3. AKI on CKD IIIb - B/l SCr ~1.7 - Cardiorenal/low-output - SCr 2.6 on admit>>stable at 1.9 today   4. Shock Liver - Improving.   5. PAF - SR this admit. Last Afib detection on device in 08/24. - on Eliquis    6. CAD - s/p remote CABG in 1996 - last cath 2022: patent LIMA-LAD, occluded SVG-OM3 w/ collaterals, 95% pSVG-PDA treated w/ DES  - has frequent nitrate responsive CP + WMA on echo  - continue Plavix - no ASA given need for Eliquis  - reduced imdur to 30 mg daily. Remains on ranexa 500 bid - atorva + Zetia    7. H/o Severe AS - s/p TAVR w/ 26 mm S3 in 2018 - stable valve on admit echo w/ mean gradient 9 mmHg. Trivial AI.   Worrisome long term prognosis  Length of Stay: 5  Romie Minus, MD  02/14/2023, 1:50 PM  Advanced Heart Failure Team Pager [phone] (M-F; 7a - 5p)  Please contact [facility] for  night-coverage after hours (5p -7a ) and weekends on [email or web address]

## 2023-02-15 ENCOUNTER — Telehealth (HOSPITAL_COMMUNITY): Payer: Self-pay | Admitting: Cardiology

## 2023-02-15 ENCOUNTER — Telehealth: Payer: Self-pay | Admitting: Cardiology

## 2023-02-15 ENCOUNTER — Other Ambulatory Visit: Payer: Self-pay

## 2023-02-15 DIAGNOSIS — I5043 Acute on chronic combined systolic (congestive) and diastolic (congestive) heart failure: Secondary | ICD-10-CM | POA: Diagnosis not present

## 2023-02-15 LAB — CBC
HCT: 23.3 % — ABNORMAL LOW (ref 39.0–52.0)
Hemoglobin: 7.5 g/dL — ABNORMAL LOW (ref 13.0–17.0)
MCH: 28.7 pg (ref 26.0–34.0)
MCHC: 32.2 g/dL (ref 30.0–36.0)
MCV: 89.3 fL (ref 80.0–100.0)
Platelets: 128 10*3/uL — ABNORMAL LOW (ref 150–400)
RBC: 2.61 MIL/uL — ABNORMAL LOW (ref 4.22–5.81)
RDW: 15.9 % — ABNORMAL HIGH (ref 11.5–15.5)
WBC: 6.1 10*3/uL (ref 4.0–10.5)
nRBC: 0 % (ref 0.0–0.2)

## 2023-02-15 LAB — GLUCOSE, CAPILLARY
Glucose-Capillary: 82 mg/dL (ref 70–99)
Glucose-Capillary: 97 mg/dL (ref 70–99)
Glucose-Capillary: 118 mg/dL — ABNORMAL HIGH (ref 70–99)
Glucose-Capillary: 106 mg/dL — ABNORMAL HIGH (ref 70–99)
Glucose-Capillary: 92 mg/dL (ref 70–99)

## 2023-02-15 LAB — BASIC METABOLIC PANEL
Anion gap: 14 (ref 5–15)
BUN: 27 mg/dL — ABNORMAL HIGH (ref 8–23)
CO2: 24 mmol/L (ref 22–32)
Calcium: 7.3 mg/dL — ABNORMAL LOW (ref 8.9–10.3)
Chloride: 101 mmol/L (ref 98–111)
Creatinine, Ser: 1.46 mg/dL — ABNORMAL HIGH (ref 0.61–1.24)
GFR, Estimated: 49 mL/min — ABNORMAL LOW (ref >60)
Glucose, Bld: 92 mg/dL (ref 70–99)
Potassium: 3.1 mmol/L — ABNORMAL LOW (ref 3.5–5.1)
Sodium: 139 mmol/L (ref 135–145)

## 2023-02-15 LAB — CULTURE, BLOOD (ROUTINE X 2)
Culture: NO GROWTH
Culture: NO GROWTH
Special Requests: ADEQUATE
Special Requests: ADEQUATE

## 2023-02-15 LAB — COOXEMETRY PANEL
Carboxyhemoglobin: 2.1 % — ABNORMAL HIGH (ref 0.5–1.5)
Methemoglobin: <0.7 % (ref 0.0–1.5)
O2 Saturation: 62.8 %
Total hemoglobin: 12.8 g/dL (ref 12.0–16.0)

## 2023-02-15 LAB — MAGNESIUM: Magnesium: 2.1 mg/dL (ref 1.7–2.4)

## 2023-02-15 MED ORDER — FUROSEMIDE 40 MG PO TABS: 40.0000 mg | ORAL_TABLET | Freq: Every day | ORAL | Status: DC

## 2023-02-15 MED ORDER — POTASSIUM CHLORIDE CRYS ER 20 MEQ PO TBCR
40.0000 meq | EXTENDED_RELEASE_TABLET | Freq: Two times a day (BID) | ORAL | Status: AC
  Administered 2023-02-15 (×2): 40 meq via ORAL
  Filled 2023-02-15 (×2): qty 2

## 2023-02-15 MED ORDER — SPIRONOLACTONE 25 MG PO TABS: 12.5000 mg | ORAL_TABLET | Freq: Every day | ORAL | 3 refills | Status: DC

## 2023-02-15 NOTE — Progress Notes (Signed)
Orthopedic Tech Progress Note Patient Details:  [name] August 23, 1945 [id number]  Ortho Devices Type of Ortho Device: Ace wrap, Unna boot Ortho Device/Splint Location: BLE Ortho Device/Splint Interventions: Ordered, Application, Adjustment   Post Interventions Patient Tolerated: Well Instructions Provided: Care of device  Donald Pore 02/15/2023, 5:58 PM

## 2023-02-15 NOTE — Telephone Encounter (Signed)
-  pt wife called to request call from Dr Elwyn Lade  Reports she was told Dr Elwyn Lade would call with  update on Friday and never did   -will send to inpatient team to assist

## 2023-02-15 NOTE — Telephone Encounter (Signed)
Called and spoke to patient's wife. Verified patient's name and DOB. She is calling to get an update on patient. Advised her the nurses on the floor are the best ones to talk to to get the most current UTD information. She verbalized understanding and agree.

## 2023-02-15 NOTE — Telephone Encounter (Signed)
Pt's wife calling about pt being in the hospital. She would like to speak to nurse.

## 2023-02-15 NOTE — Progress Notes (Signed)
Occupational Therapy Treatment Patient Details Name: [name] MRN: [id number] DOB: 1945-03-13 Today's Date: 02/15/2023   History of present illness Pt is a 78 y/o male presenting 1/28 with dyspnea on exertion and leg swelling. Found to have reduced ejection fraction, NSTEMI and influenza A+. During admission, developed hypotension and signs of cardiogenic shock so transferred to ICU. PMH: HFrEF, CAD, HTN   OT comments  Pt with flat, depressed affect this AM. With gentle encouragement, pt able to sit EOB with Min A and assist with EOB bathing/dressing with no more than Min A. Pt politely declined OOB attempts today. Noted prior discussions w/ wife unable to provide physical assist at DC and requesting consideration of inpatient follow up therapy, <3 hours/day at DC. Also noted today, pending palliative consult to establish GOC. Will continue to follow acutely and update DC recs as appropriate.       If plan is discharge home, recommend the following:  A little help with bathing/dressing/bathroom;Assistance with cooking/housework;Help with stairs or ramp for entrance   Equipment Recommendations  None recommended by OT    Recommendations for Other Services      Precautions / Restrictions Precautions Precautions: Fall Precaution Comments: monitor O2, incontinent of bowel Restrictions Weight Bearing Restrictions Per Provider Order: No       Mobility Bed Mobility Overal bed mobility: Needs Assistance Bed Mobility: Supine to Sit     Supine to sit: Min assist     General bed mobility comments: assist to lift trunk with handheld assist    Transfers                   General transfer comment: declined     Balance Overall balance assessment: Needs assistance Sitting-balance support: Single extremity supported, Feet unsupported, Feet supported Sitting balance-Leahy Scale: Good                                     ADL either performed or assessed with  clinical judgement   ADL Overall ADL's : Needs assistance/impaired     Grooming: Set up;Sitting;Applying deodorant;Wash/dry face   Upper Body Bathing: Minimal assistance;Sitting Upper Body Bathing Details (indicate cue type and reason): assist to bathe back Lower Body Bathing: Minimal assistance;Sitting/lateral leans Lower Body Bathing Details (indicate cue type and reason): Min A to bathe posterior region w/ pt opting to laterally lean rather than stand Upper Body Dressing : Set up;Sitting                     General ADL Comments: Pt with flat affect and low mood, disheartened by reportedly poor prognosis. declined OOB but agreeable for EOB ADLs with encouragement    Extremity/Trunk Assessment Upper Extremity Assessment Upper Extremity Assessment: Generalized weakness;Right hand dominant   Lower Extremity Assessment Lower Extremity Assessment: Defer to PT evaluation        Vision   Vision Assessment?: No apparent visual deficits   Perception     Praxis      Cognition Arousal: Alert Behavior During Therapy: Flat affect Overall Cognitive Status: Within Functional Limits for tasks assessed                                 General Comments: flat affect, low mood due to reported poor prognosis. short one word answers        Exercises  Shoulder Instructions       General Comments      Pertinent Vitals/ Pain       Pain Assessment Pain Assessment: No/denies pain  Home Living                                          Prior Functioning/Environment              Frequency  Min 1X/week        Progress Toward Goals  OT Goals(current goals can now be found in the care plan section)  Progress towards OT goals: Progressing toward goals  Acute Rehab OT Goals Patient Stated Goal: ready to go OT Goal Formulation: With patient Time For Goal Achievement: 02/26/23 Potential to Achieve Goals: Good ADL Goals Pt Will  Transfer to Toilet: with modified independence;ambulating Additional ADL Goal #1: Pt to increase standing activity tolerance during ADLs/mobility > 10 min without seated rest break  Plan      Co-evaluation                 AM-PAC OT "6 Clicks" Daily Activity     Outcome Measure   Help from another person eating meals?: None Help from another person taking care of personal grooming?: A Little Help from another person toileting, which includes using toliet, bedpan, or urinal?: A Little Help from another person bathing (including washing, rinsing, drying)?: A Little Help from another person to put on and taking off regular upper body clothing?: A Little Help from another person to put on and taking off regular lower body clothing?: A Little 6 Click Score: 19    End of Session Equipment Utilized During Treatment: Oxygen  OT Visit Diagnosis: Unsteadiness on feet (R26.81);Muscle weakness (generalized) (M62.81)   Activity Tolerance Patient limited by fatigue   Patient Left in bed;with call bell/phone within reach (sitting EOB - RN aware)   Nurse Communication Mobility status        Time: 7829-5621 OT Time Calculation (min): 25 min  Charges: OT General Charges $OT Visit: 1 Visit OT Treatments $Self Care/Home Management : 23-37 mins  Bradd Canary, OTR/L Acute Rehab Services Office: [phone]   Lorre Munroe 02/15/2023, 10:20 AM

## 2023-02-15 NOTE — Progress Notes (Addendum)
Advanced Heart Failure Rounding Note  Cardiologist: Peter Swaziland, MD   Chief Complaint: Mixed cardiogenic/septic shock  Subjective:   01/29: Transferred to the ICU for shock. Started on milrinone + NE. 2/1 Milrinone weaned off 2/2 Failed milrinone wean. Milrinone added back at 0.125 mcg/kg/min.  CO-OX 63% on 0.125 milrinone.   No BMET today.   Notes fatigue, intermittent nausea, diarrhea and occasional shortness of breath. Trouble getting pills down.    Objective:   Weight Range: 70.4 kg Body mass index is 22.27 kg/m.   Vital Signs:   Temp:  [97.6 F (36.4 C)-98.1 F (36.7 C)] 97.6 F (36.4 C) (02/03 0331) Pulse Rate:  [77-100] 100 (02/03 0550) Resp:  [20-27] 20 (02/03 0550) BP: (118-130)/(66-93) 130/72 (02/03 0331) SpO2:  [91 %-96 %] 94 % (02/03 0550) Weight:  [70.4 kg] 70.4 kg (02/03 0550) Last BM Date : 02/13/23  Weight change: Filed Weights   02/11/23 0500 02/12/23 0449 02/15/23 0550  Weight: 77.6 kg 78 kg 70.4 kg    Intake/Output:   Intake/Output Summary (Last 24 hours) at 02/15/2023 0801 Last data filed at 02/14/2023 2340 Gross per 24 hour  Intake 236.91 ml  Output 1150 ml  Net -913.09 ml     CVP 4 sitting up Physical Exam    General:  Chronically ill appearing. Sitting up on side of bed.  HEENT: normal Neck: supple. No JVD. Cor: PMI nondisplaced. Regular rate & rhythm. No rubs, gallops or murmurs. Lungs: scattered expiratory wheezes Abdomen: soft, nontender, nondistended.  Extremities: no cyanosis, clubbing, rash, edema Neuro: alert & orientedx3. Affect depressed.    Telemetry   BiV paced 80s-90s, ~ 5 PVCs/min  EKG    No new  Labs    CBC No results for input(s): "WBC", "NEUTROABS", "HGB", "HCT", "MCV", "PLT" in the last 72 hours.  Basic Metabolic Panel Recent Labs    16/10/96 1025 02/14/23 0530 02/15/23 0617  NA 134* 134*  --   K 3.8 3.7  --   CL 94* 94*  --   CO2 27 29  --   GLUCOSE 156* 95  --   BUN 36* 33*  --    CREATININE 1.97* 1.85*  --   CALCIUM 8.8* 8.7*  --   MG 2.2 2.1 2.1   Liver Function Tests No results for input(s): "AST", "ALT", "ALKPHOS", "BILITOT", "PROT", "ALBUMIN" in the last 72 hours.  No results for input(s): "LIPASE", "AMYLASE" in the last 72 hours. Cardiac Enzymes No results for input(s): "CKTOTAL", "CKMB", "CKMBINDEX", "TROPONINI" in the last 72 hours.  BNP: BNP (last 3 results) Recent Labs    02/10/23 0509  BNP 1,804.8*    ProBNP (last 3 results) No results for input(s): "PROBNP" in the last 8760 hours.   D-Dimer No results for input(s): "DDIMER" in the last 72 hours. Hemoglobin A1C Recent Labs    02/12/23 1004  HGBA1C 6.4*   Fasting Lipid Panel No results for input(s): "CHOL", "HDL", "LDLCALC", "TRIG", "CHOLHDL", "LDLDIRECT" in the last 72 hours. Thyroid Function Tests No results for input(s): "TSH", "T4TOTAL", "T3FREE", "THYROIDAB" in the last 72 hours.  Invalid input(s): "FREET3"  Other results:     Medications:     Scheduled Medications:  apixaban  5 mg Oral BID   atorvastatin  80 mg Oral Daily   Chlorhexidine Gluconate Cloth  6 each Topical Daily   clopidogrel  75 mg Oral Daily   ezetimibe  10 mg Oral Daily   finasteride  5 mg Oral Daily  insulin aspart  0-5 Units Subcutaneous QHS   insulin aspart  0-9 Units Subcutaneous TID WC   isosorbide mononitrate  30 mg Oral Daily   losartan  25 mg Oral Daily   pantoprazole  40 mg Oral Daily   ranolazine  500 mg Oral BID   sodium chloride flush  10-40 mL Intracatheter Q12H   sodium chloride flush  3 mL Intravenous Q12H   spironolactone  25 mg Oral Daily    Infusions:  milrinone 0.125 mcg/kg/min (02/15/23 0211)     PRN Medications: acetaminophen, albuterol, ondansetron (ZOFRAN) IV, mouth rinse, sodium chloride flush, sodium chloride flush    Patient Profile   78 y/o male w/ CAD s/p CABG in 1996 and subsequent stenting, LBBB, chronic systolic CHF s/p BiV ICD, low gradient, low flow  severe AS s/p TAVR (02/2016).     Admitted w/ a/c BiV CHF w/ marked volume overload and low output. Influenza A +  Assessment/Plan  Mixed septic/cardiogenic shock -Transferred to ICU 01/29 with AMS and lactic acidosis. -Treated with Tamiflu for flu A and empiric abx (vanc + cefepime) for suspected superimposed PNA -Lactic acid peaked at 5.9>>cleared -Failed milrinone wean. See discussion below. Poor long term prognosis.   2. Acute on chronic Biventricular Systolic Heart Failure  - h/o ischemic CM + Known LBBB s/p CRT-D  - EF has been as low as 30-35% in the past. - Echo 2019 EF improved to 40-45%.   - Echo in 2023 EF 50-55%, RV mildly reduced, mild LVH, GIIIDD (restrictive) - Echo with EF < 20%, RWMA, RV mildly reduced, moderate to severe TR - Now admitted w/ NYHA Class IIIb symptoms and shock as above. - Failed milrinone wean, restarted yesterday. CO-OX 63% on 0.125 milrinone. - Volume status appears low on exam. Reports little PO intake and several episodes of diarrhea. Hold off on diuresis today.  - Continue spiro at 25 mg daily - Continue losartan 25mg  daily - Check BMET   3. AKI on CKD IIIb - B/l SCr ~1.7 - Cardiorenal/low-output - SCr 2.6 on admit>>stable at 1.9 yesterday. Check labs this am.    4. Shock Liver - Improving.   5. PAF - Maintaining SR. Last Afib detection on device in 08/24. - on Eliquis    6. CAD - s/p remote CABG in 1996 - last cath 2022: patent LIMA-LAD, occluded SVG-OM3 w/ collaterals, 95% pSVG-PDA treated w/ DES  - has frequent nitrate responsive CP + WMA on echo  - continue Plavix - no ASA given need for Eliquis  - Reduced imdur to 30 mg daily. Remains on ranexa 500 bid - atorva + Zetia    7. H/o Severe AS - s/p TAVR w/ 26 mm S3 in 2018 - stable valve on admit echo w/ mean gradient 9 mmHg. Trivial AI.   Very depressed. Mentions that he is ready for hospice. Expresses concerns that his wife is not ready. Will place urgent Palliative Care  consult.  Length of Stay: 6  FINCH, LINDSAY N, PA-C  02/15/2023, 8:01 AM  Advanced Heart Failure Team Pager [phone] (M-F; 7a - 5p)  Please contact [facility] for night-coverage after hours (5p -7a ) and weekends on [email or web address]  Patient seen with PA, agree with the above note.   Patient is back on milrinone 0.125 today, co-ox 63%.  CVP 5.  No BMET yet this morning.   He denies dyspnea this morning, feels better back on milrinone.    He tells me "I want to die."  General: NAD, frail Neck: No JVD, no thyromegaly or thyroid nodule.  Lungs: Clear to auscultation bilaterally with normal respiratory effort. CV: Lateral PMI.  Heart regular S1/S2, no S3/S4, no murmur.  No peripheral edema.   Abdomen: Soft, nontender, no hepatosplenomegaly, no distention.  Skin: Intact without lesions or rashes.  Neurologic: Alert and oriented x 3.  Psych: Depressed affect. Extremities: No clubbing or cyanosis.  HEENT: Normal.   Suspect end stage ischemic cardiomyopathy.  Did not tolerate milrinone wean well, now back on milrinone 0.125 with co-ox improved at 63%.  Volume status looks ok with CVP 5.  No BMET yet today.  - BMET stat.  - No po Lasix yet with low CVP and poor po intake. - Continue milrinone 0.125 for now.  - Continue losartan 25 daily. - Continue spironolactone 25 daily - Not candidate for advanced therapies, very frail, not strong enough to go home.  We had a discussion about hospice care today.  He is very interested in hospice care, potential [facility], but tells me that his wife is not ready for this yet.  I will consult palliative care service.  If he decides to go hospice/comfort route, will stop milrinone and let him go to [facility].   Stable CAD, no chest pain on current regimen of ranolazine and Imdur.  Continue current meds for now.   He is in NSR, on apixaban (continue for now).   Marca Ancona 02/15/2023 9:19 AM

## 2023-02-15 NOTE — TOC Progression Note (Addendum)
Transition of Care [facility]) - Progression Note    Patient Details  Name: [name] MRN: [id number] Date of Birth: 09/05/1945  [facility] CM/SW Contact  Elliot Cousin, RN Phone Number: [phone] 02/15/2023, 4:09 PM  Clinical Narrative:    HF TOC CM spoke to pt and gave permission to speak to wife. Wife is requesting [facility]. (Medicare.gov list placed on chart and provided to pt). Contacted [facility] rep, Melissa and they will review referral and speak to wife. They did not have a bed available today at [facility]. Updated attending.   Will need Gold DNR form and will arrange PTAR, once [facility] can confirm bed.    Expected Discharge Plan: Home w Home Health Services Barriers to Discharge: Continued Medical Work up  Expected Discharge Plan and Services   Discharge Planning Services: CM Consult Post Acute Care Choice: Residential Hospice Bed Living arrangements for the past 2 months: Single Family Home                           HH Arranged: PT, RN, OT [facility] Agency: [facility] Date [facility] Agency Contacted: 02/15/23 Time HH Agency Contacted: [id number] Representative spoke with at [facility] Agency: Melissa   Social Determinants of Health (SDOH) Interventions SDOH Screenings   Food Insecurity: No Food Insecurity (02/10/2023)  Housing: Low Risk  (02/10/2023)  Transportation Needs: No Transportation Needs (02/10/2023)  Utilities: Not At Risk (02/09/2023)  Social Connections: Moderately Isolated (02/10/2023)  Tobacco Use: Medium Risk (02/09/2023)    Readmission Risk Interventions     No data to display

## 2023-02-15 NOTE — Plan of Care (Signed)

## 2023-02-15 NOTE — TOC Progression Note (Signed)
Transition of Care [facility]) - Progression Note    Patient Details  Name: [name] MRN: [id number] Date of Birth: 26-Jun-1945  Transition of Care [facility]) CM/SW Contact  Nicanor Bake Phone Number: [phone] 02/15/2023, 2:32 PM  Clinical Narrative:  HF CSW called and spoke with pts wife, [name]. Pts wife was upset and spoke about her grievances. Pts wife requested to speak with someone from the medical team. CSW notified the PA through secure chat that the family asked to speak with.  CSW offered resources and additional support if needed. Pts wife and son stated that they are moving forward with Hospice and ok at this time.    TOC will continue following.        Barriers to Discharge: Continued Medical Work up  Expected Discharge Plan and Services   Discharge Planning Services: CM Consult Post Acute Care Choice: Home Health Living arrangements for the past 2 months: Single Family Home                           HH Arranged: PT, RN, OT [facility] Agency: [facility] Date [facility] Agency Contacted: 02/12/23 Time HH Agency Contacted: 1700 Representative spoke with at [facility] Agency: Amy   Social Determinants of Health (SDOH) Interventions SDOH Screenings   Food Insecurity: No Food Insecurity (02/10/2023)  Housing: Low Risk  (02/10/2023)  Transportation Needs: No Transportation Needs (02/10/2023)  Utilities: Not At Risk (02/09/2023)  Social Connections: Moderately Isolated (02/10/2023)  Tobacco Use: Medium Risk (02/09/2023)    Readmission Risk Interventions     No data to display

## 2023-02-16 DIAGNOSIS — N179 Acute kidney failure, unspecified: Secondary | ICD-10-CM | POA: Diagnosis not present

## 2023-02-16 DIAGNOSIS — I5043 Acute on chronic combined systolic (congestive) and diastolic (congestive) heart failure: Secondary | ICD-10-CM | POA: Diagnosis not present

## 2023-02-16 DIAGNOSIS — Z7189 Other specified counseling: Secondary | ICD-10-CM

## 2023-02-16 DIAGNOSIS — R0600 Dyspnea, unspecified: Secondary | ICD-10-CM

## 2023-02-16 DIAGNOSIS — Z515 Encounter for palliative care: Secondary | ICD-10-CM

## 2023-02-16 LAB — GLUCOSE, CAPILLARY: Glucose-Capillary: 102 mg/dL — ABNORMAL HIGH (ref 70–99)

## 2023-02-16 MED ORDER — MORPHINE SULFATE (PF) 2 MG/ML IV SOLN
2.0000 mg | INTRAVENOUS | Status: DC | PRN
  Administered 2023-02-16: 2 mg via INTRAVENOUS
  Filled 2023-02-16: qty 1

## 2023-02-16 MED ORDER — HYDROMORPHONE HCL 1 MG/ML IJ SOLN
1.0000 mg | INTRAMUSCULAR | Status: DC | PRN
  Administered 2023-02-16: 2 mg via INTRAVENOUS
  Filled 2023-02-16: qty 2

## 2023-02-16 MED ORDER — LORAZEPAM 2 MG/ML IJ SOLN
1.0000 mg | Freq: Four times a day (QID) | INTRAMUSCULAR | Status: DC
  Administered 2023-02-16 (×2): 1 mg via INTRAVENOUS
  Filled 2023-02-16 (×2): qty 1

## 2023-02-16 NOTE — Progress Notes (Addendum)
 Advanced Heart Failure Rounding Note  Cardiologist: Peter Jordan, MD   Chief Complaint: Mixed cardiogenic/septic shock  Subjective:   01/29: Transferred to the ICU for shock. Started on milrinone  + NE. 2/1 Milrinone  weaned off 2/2 Failed milrinone  wean. Milrinone  added back at 0.125 mcg/kg/min.  Remains on 0.125 milrinone  while awaiting inpatient hospice. Have stopped lab draws.   Very fatigued. No dyspnea or chest pain.    Objective:   Weight Range: 72.2 kg Body mass index is 22.84 kg/m.   Vital Signs:   Temp:  [97.2 F (36.2 C)-97.9 F (36.6 C)] 97.5 F (36.4 C) (02/04 0427) Pulse Rate:  [78-87] 84 (02/04 0427) Resp:  [18-20] 19 (02/04 0427) BP: (86-137)/(54-74) 129/68 (02/04 0427) SpO2:  [94 %-98 %] 94 % (02/04 0427) Weight:  [72.2 kg] 72.2 kg (02/04 0427) Last BM Date : 02/15/23  Weight change: Filed Weights   02/12/23 0449 02/15/23 0550 02/16/23 0427  Weight: 78 kg 70.4 kg 72.2 kg    Intake/Output:  No intake or output data in the 24 hours ending 02/16/23 0730     Physical Exam  General:  Frail, ill appearing. HEENT: normal Neck: supple. no JVD.  Cor: PMI nondisplaced. Regular rate & rhythm. No rubs, gallops or murmurs. Lungs: clear Abdomen: soft, nontender, nondistended.  Extremities: no cyanosis, clubbing, rash, + UNNA boots Neuro: alert & orientedx3. Affect depressed   Telemetry   AV paced 80s  EKG    No new  Labs    CBC Recent Labs    02/15/23 0803  WBC 6.1  HGB 7.5*  HCT 23.3*  MCV 89.3  PLT 128*    Basic Metabolic Panel Recent Labs    97/97/74 0530 02/15/23 0617 02/15/23 0803  NA 134*  --  139  K 3.7  --  3.1*  CL 94*  --  101  CO2 29  --  24  GLUCOSE 95  --  92  BUN 33*  --  27*  CREATININE 1.85*  --  1.46*  CALCIUM  8.7*  --  7.3*  MG 2.1 2.1  --    Liver Function Tests No results for input(s): AST, ALT, ALKPHOS, BILITOT, PROT, ALBUMIN  in the last 72 hours.  No results for input(s): LIPASE,  AMYLASE in the last 72 hours. Cardiac Enzymes No results for input(s): CKTOTAL, CKMB, CKMBINDEX, TROPONINI in the last 72 hours.  BNP: BNP (last 3 results) Recent Labs    02/10/23 0509  BNP 1,804.8*    ProBNP (last 3 results) No results for input(s): PROBNP in the last 8760 hours.   D-Dimer No results for input(s): DDIMER in the last 72 hours. Hemoglobin A1C No results for input(s): HGBA1C in the last 72 hours.  Fasting Lipid Panel No results for input(s): CHOL, HDL, LDLCALC, TRIG, CHOLHDL, LDLDIRECT in the last 72 hours. Thyroid Function Tests No results for input(s): TSH, T4TOTAL, T3FREE, THYROIDAB in the last 72 hours.  Invalid input(s): FREET3  Other results:     Medications:     Scheduled Medications:  apixaban   5 mg Oral BID   atorvastatin   80 mg Oral Daily   Chlorhexidine  Gluconate Cloth  6 each Topical Daily   clopidogrel   75 mg Oral Daily   ezetimibe   10 mg Oral Daily   finasteride   5 mg Oral Daily   insulin  aspart  0-5 Units Subcutaneous QHS   insulin  aspart  0-9 Units Subcutaneous TID WC   isosorbide  mononitrate  30 mg Oral Daily   losartan   25 mg Oral Daily   pantoprazole   40 mg Oral Daily   ranolazine   500 mg Oral BID   sodium chloride  flush  10-40 mL Intracatheter Q12H   sodium chloride  flush  3 mL Intravenous Q12H   spironolactone   25 mg Oral Daily    Infusions:  milrinone  0.125 mcg/kg/min (02/15/23 1230)     PRN Medications: acetaminophen , albuterol , ondansetron  (ZOFRAN ) IV, mouth rinse, sodium chloride  flush, sodium chloride  flush    Patient Profile   78 y/o male w/ CAD s/p CABG in 1996 and subsequent stenting, LBBB, chronic systolic CHF s/p BiV ICD, low gradient, low flow severe AS s/p TAVR (02/2016).     Admitted w/ a/c BiV CHF w/ marked volume overload and low output. Influenza A +  Assessment/Plan  Mixed septic/cardiogenic shock -Transferred to ICU 01/29 with AMS and lactic  acidosis. -Treated with Tamiflu  for flu A and empiric abx (vanc + cefepime ) for suspected superimposed PNA -Lactic acid peaked at 5.9>>cleared -Initially improved with inotropes/pressors, but subsequently worsened after failing milrinone  wean. Not a candidate for advanced therapies. Very frail, not strong enough to go home. He wishes to proceed with hospice care, HF [facility] CM/CSW arranging discharge to [facility]. Discussed with patient's wife and son via phone yesterday. They are understandably upset by how his condition has worsened over the last few weeks.  2. Acute on chronic Biventricular Systolic Heart Failure  - h/o ischemic CM + Known LBBB s/p CRT-D  - EF has been as low as 30-35% in the past. - Echo 2019 EF improved to 40-45%.   - Echo in 2023 EF 50-55%, RV mildly reduced, mild LVH, GIIIDD (restrictive) - Echo with EF < 20%, RWMA, RV mildly reduced, moderate to severe TR - Now admitted w/ NYHA Class IIIb symptoms and shock as above. - Failed milrinone  wean. Milrinone  restarted at 0.125 mcg/kg/min on 02/02. Continue for now. - Stop lab draws and CVP monitoring. -Remains on losartan  25 mg daily, spiro 25 mg daily. Will stop once he transitions to hospice care. - Wants ICD deactivated.   3. AKI on CKD IIIb - B/l SCr ~1.7 - Cardiorenal/low-output - SCr 2.6 on admit>>stable at 1.5 yesterday - No further lab draws   4. Shock Liver - Improved   5. PAF - Maintaining SR. Last Afib detection on device in 08/24. - on Eliquis     6. CAD - s/p remote CABG in 1996 - last cath 2022: patent LIMA-LAD, occluded SVG-OM3 w/ collaterals, 95% pSVG-PDA treated w/ DES  - has frequent nitrate responsive CP + WMA on echo  - continue Plavix  - no ASA given need for Eliquis   - Reduced imdur  to 30 mg daily. Remains on ranexa  500 bid. No chest pain. - atorva + Zetia     7. H/o Severe AS - s/p TAVR w/ 26 mm S3 in 2018 - stable valve on admit echo w/ mean gradient 9 mmHg. Trivial AI.   Plan for  discharge once bed available, patient/wife hoping for [facility]. Hopefully this will be today. Appreciate HF TOC CM/SW.  Length of Stay: 7  FINCH, LINDSAY N, PA-C  02/16/2023, 7:30 AM  Advanced Heart Failure Team Pager [phone] (M-F; 7a - 5p)  Please contact [facility] for night-coverage after hours (5p -7a ) and weekends on [email or web address]  Patient seen with PA and I formulated plan, agree with the above note.   He says that he is tired this morning.  Wants to sleep but cannot sleep.  He remains on  milrinone  0.125.   General: NAD, frail.  Neck: No JVD, no thyromegaly or thyroid nodule.  Lungs: Clear to auscultation bilaterally with normal respiratory effort. CV: Nondisplaced PMI.  Heart regular S1/S2, no S3/S4, no murmur.  No peripheral edema.   Abdomen: Soft, nontender, no hepatosplenomegaly, no distention.  Skin: Intact without lesions or rashes.  Neurologic: Alert and oriented x 3.  Psych: Normal affect. Extremities: No clubbing or cyanosis.  HEENT: Normal.   Suspect end stage ischemic cardiomyopathy.  Did not tolerate milrinone  wean well, now back on milrinone  0.125.  He does not look volume overloaded on exam.  Not candidate for advanced therapies, very frail, not strong enough to go home.  I have had discussions about hospice care with him.  He wants comfort care at this point and is clear about this.  I think this is very reasonable.  - We are awaiting a bed at [facility].  - I will deactivate the ICD (discussed with patient and wife).  - I have spoken with his wife about the above today and yesterday.  - Will stop milrinone  when he goes to [facility].  - Consulted palliative care to help with symptom management.   Ezra Shuck 02/16/2023 9:13 AM

## 2023-02-16 NOTE — TOC Progression Note (Signed)
 Transition of Care [facility]) - Progression Note    Patient Details  Name: [name] MRN: [id number] Date of Birth: 12/04/1945  Transition of [facility]) CM/SW Contact  Arlana JINNY Nicholaus [place]RA[place] Phone Number: [phone] 02/16/2023, 11:14 AM  Clinical Narrative:  HF CSW completed pts PTAR paperwork and got  DNR signed. CSW will call PTAR was facility notifies that they are ready.   TOC will continue following.      Expected Discharge Plan: Home w Home Health Services Barriers to Discharge: Continued Medical Work up  Expected Discharge Plan and Services   Discharge Planning Services: CM Consult Post Acute Care Choice: Residential Hospice Bed Living arrangements for the past 2 months: Single Family Home                           HH Arranged: PT, RN, OT [facility] Agency: [facility] Date [facility] Agency Contacted: 02/15/23 Time HH Agency Contacted: [id number] Representative spoke with at [facility] Agency: Melissa   Social Determinants of Health (SDOH) Interventions SDOH Screenings   Food Insecurity: No Food Insecurity (02/10/2023)  Housing: Low Risk  (02/10/2023)  Transportation Needs: No Transportation Needs (02/10/2023)  Utilities: Not At Risk (02/09/2023)  Social Connections: Moderately Isolated (02/10/2023)  Tobacco Use: Medium Risk (02/09/2023)    Readmission Risk Interventions     No data to display

## 2023-02-16 NOTE — Progress Notes (Signed)
 [facility] 4E01  [facility] Liaison Note  Received request from [facility] manager for family interest in [facility]. Eligibility confirmed. Met with patient and spoke with wife to confirm interest and explain services. Family agreeable to transfer today. TOC aware. RN please call report to [phone] prior to patient leaving the unit. Please send signed DNR with patient at discharge.   Thank you for allowing us  to participate in this patient's care.  Eleanor Nail, LPN [facility] Liaison [phone]

## 2023-02-16 NOTE — Discharge Summary (Addendum)
 Advanced Heart Failure Team  Discharge Summary   Patient ID: [name] MRN: [id number], DOB/AGE: 80/02/1945 77 y.o. Admit date: 02/09/2023 D/C date:     02/16/2023   Primary Discharge Diagnoses:  Mixed septic/cardiogenic shock End-stage acute on chronic biventricular systolic CHF AKI on CKD IIIb Shock Liver  Secondary Discharge Diagnoses:  PAF CAD Severe AS  Hospital Course: 78 y.o. male with history of chronic systolic CHF s/p BiV ICD with most recent EF 35-40% in 03/23 (lower than stated on report), CAD s/p CABG in 1996 and subsequent stenting, LBBB, severe AS s/p TAVR.   Seen in Cardiology clinic 02/09/23 for worsening HF symptoms X 1 week. Echo same day with EF < 20%, LV severely dilated, RV severely dilated with mildly reduced function, moderate MR. He was transported to the hospital via EMS.   Admitted for acute on chronic systolic CHF, AKI on CKD IIIb and shock liver. Positive for influenza A. Subsequently found to have lactic acidosis and altered mental status. He was transferred to the ICU and started on inotrope/vasopressor support with norepinephrine  and milrinone  for cardiogenic shock. He was aggressively diuresed. Treated with tamiflu  and broad spectrum IV antibiotics for suspected septic component to shock. Lactic acid cleared. He initially improved and weaned off milrinone  and norepinephrine  on 02/01. Unfortunately, failed inotrope wean with worsening nausea and respiratory status. Low dose milrinone  added back. Despite this, he had progressive fatigue and decline. This raised concern for end-stage biventricular heart failure for which he was not a candidate for advanced therapies.   DNR status confirmed with patient. He wished to transition to comfort measures/hospice. ICD deactivated. Palliative Care and HF TOC CM/SW assisted with arranging discharge to inpatient hospice at [facility].   All medications discontinued at discharge. Inpatient hospice to titrate comfort  gtts at facility. PICC line left in place for infusions.   Discharge Vitals: Blood pressure 123/72, pulse 82, temperature (!) 97.4 F (36.3 C), temperature source Oral, resp. rate 14, height 5' 10 (1.778 m), weight 72.2 kg, SpO2 98%.  Labs: Lab Results  Component Value Date   WBC 6.1 02/15/2023   HGB 7.5 (L) 02/15/2023   HCT 23.3 (L) 02/15/2023   MCV 89.3 02/15/2023   PLT 128 (L) 02/15/2023    Recent Labs  Lab 02/11/23 0522 02/11/23 1340 02/15/23 0803  NA 134*   < > 139  K 2.7*   < > 3.1*  CL 93*   < > 101  CO2 30   < > 24  BUN 43*   < > 27*  CREATININE 2.10*   < > 1.46*  CALCIUM  8.0*   < > 7.3*  PROT 5.3*  --   --   BILITOT 3.1*  --   --   ALKPHOS 64  --   --   ALT 580*  --   --   AST 654*  --   --   GLUCOSE 211*   < > 92   < > = values in this interval not displayed.   Lab Results  Component Value Date   CHOL 152 10/23/2022   HDL 44 10/23/2022   LDLCALC 88 10/23/2022   TRIG 112 10/23/2022   BNP (last 3 results) Recent Labs    02/10/23 0509  BNP 1,804.8*    ProBNP (last 3 results) No results for input(s): PROBNP in the last 8760 hours.   Diagnostic Studies/Procedures   None obtained this admission  Discharge Medications   Allergies as of 02/16/2023  No Known Allergies      Medication List     STOP taking these medications    atorvastatin  80 MG tablet Commonly known as: LIPITOR    clopidogrel  75 MG tablet Commonly known as: PLAVIX    cyanocobalamin 500 MCG tablet Commonly known as: VITAMIN B12   ezetimibe  10 MG tablet Commonly known as: ZETIA    finasteride  5 MG tablet Commonly known as: PROSCAR    furosemide  40 MG tablet Commonly known as: LASIX    gabapentin  300 MG capsule Commonly known as: NEURONTIN    ibuprofen 200 MG tablet Commonly known as: ADVIL   isosorbide  mononitrate 120 MG 24 hr tablet Commonly known as: IMDUR    metoprolol  succinate 50 MG 24 hr tablet Commonly known as: TOPROL -XL   nitroGLYCERIN  0.4 MG SL  tablet Commonly known as: NITROSTAT    oxybutynin  5 MG tablet Commonly known as: DITROPAN    pantoprazole  40 MG tablet Commonly known as: PROTONIX    ranolazine  500 MG 12 hr tablet Commonly known as: RANEXA    valACYclovir  500 MG tablet Commonly known as: VALTREX    Vitamin C  500 MG Caps   Vitamin D -3 125 MCG (5000 UT) Tabs        Disposition   The patient will be discharged to inpatient hospice at [facility].      Signed, [facility], LINDSAY N  02/16/2023, 12:34 PM   Patient seen with PA, I formulated the plan and agree with the above note.   Please see my separate note from today for details today.  Patient will be discharged to [facility].   45 minutes spent on discharge including talking with the patient's family.   Ezra Shuck 02/16/2023 3:37 PM

## 2023-02-16 NOTE — Consult Note (Signed)
 Palliative Care Consult Note                                  Date: 02/16/2023   Patient Name: [name]  DOB: 26-Dec-1945  MRN: [id number]  Age / Sex: 78 y.o., male  PCP: Cleotilde Pulling, MD Referring Physician: Zenaida Morene PARAS, MD  Reason for Consultation: Establishing goals of care, symptom management  HPI/Patient Profile: 78 y.o. male  with past medical history of chronic systolic CHF s/p BiV ICD, low-flow severe AS s/p TAVR (2018), CAD s/p CABG (1996), OSA, HTN, and HLD.  He was admitted on 02/09/2023 with acute on chronic CHF with marked volume overload and low output.  He was also found to be influenza A positive with suspected superimposed pneumonia.   He initially improved with inotropes/pressors, but subsequently worsened after failing milrinone  mean.  Per cardiology he is not a candidate for advanced therapies.  Palliative Medicine was consulted for goals of of care in the setting of: Wants hospice/comfort measures, patient expresses he is ready to die.   Subjective:   Extensive chart review has been completed including labs, vital signs, imaging, progress/consult notes, orders, medications and available advance directive documents.   I met with patient at bedside to discuss diagnosis, prognosis, GOC, EOL wishes, disposition, and options.  I introduced Palliative Medicine as specialized medical care for people living with serious illness.  Created space and opportunity for patient and wife to express thoughts and feelings regarding current medical situation. Values and goals of care were attempted to be elicited.  Clinical Assessment and GOC Discussion: Patient appears very fatigued, with mildly labored breathing. He reports ongoing shortness of breath.  We discussed patient's current illness and what it means in the context of his ongoing co-morbidities. Current clinical status was reviewed. Natural trajectory at EOL  was discussed.  Patient understands he is at end of life, and ultimately is at peace with this but understandably is sad. He shares that his wife is struggling, as she thought he was going to get better.  Emotional support provided.  Discussed plan of care with patient's RN, and asked him to administer a dose of morphine  as soon as possible.  I later spoke with patient's wife by phone.  She is understandably emotional that her husband is dying.  She is supportive of his wishes to be comfortable during this transition and confirms their desire to transfer to [facility].  11:50 - I returned to bedside to assess symptom management needs.  Patient states the morphine  did not help, however his breathing does appears less labored.  I told him I would change the order to a stronger pain medication (Dilaudid ).   Patient reflects on his life and the concept of human mortality.  He tells me that he just wants to go to sleep.  I provided emotional support and reassurance that we would make his transition as peaceful and comfortable as possible.  Additional time spent in coordination of care and communication with the team - cardiology NP, RN, TOC, and hospice liaison.  I spoke with hospice RN liaisons (Melissa and [place]), and they are  going to confirm with [employer] that patient's ICD was deactivated.    Review of Systems  Constitutional:  Positive for fatigue.  Respiratory:  Positive for shortness of breath.     Objective:   Primary Diagnoses: Present on Admission: **None**   Physical Exam Vitals reviewed.  Constitutional:      Appearance: He is ill-appearing.  Pulmonary:     Comments: Mildly labored breathing  Neurological:     Mental Status: He is alert and oriented to person, place, and time.     Palliative Assessment/Data: PPS 20%     Assessment & Plan:   SUMMARY OF RECOMMENDATIONS   Comfort measures Dilaudid  1-2 mg every 2 hours as needed for pain and dyspnea Please  leave PICC in place Transfer pending to [facility] Stop milrinone  prior to discharge  Primary Decision Maker: PATIENT  Code Status/Advance Care Planning: DNR  Prognosis:  Hours - Days  Discharge Planning:  Hospice facility    Thank you for allowing us  to participate in the care of [name]   Time Total: 90 minutes  Detailed review of medical records (labs, imaging, vital signs), medically appropriate exam, discussed with treatment team, counseling and education to patient, family, & staff, documenting clinical information, medication management, coordination of care.   Signed by: Recardo Loll, NP Palliative Medicine Team  Team Phone # [phone]  For individual providers, please see AMION

## 2023-03-02 ENCOUNTER — Telehealth: Payer: Self-pay | Admitting: Cardiology

## 2023-03-02 NOTE — Telephone Encounter (Signed)
 Wife [name]) called to let Dr. [place] know her husband passed away on 2023/03/03.  Wife stated patient was in hospice prior to passing.

## 2023-03-05 NOTE — Telephone Encounter (Signed)
 Spoke with patient's wife.Sympathy expressed.She was appreciative of call.

## 2023-03-09 ENCOUNTER — Ambulatory Visit: Payer: Medicare HMO | Admitting: Cardiology

## 2023-03-09 ENCOUNTER — Other Ambulatory Visit (HOSPITAL_COMMUNITY): Payer: Medicare HMO

## 2023-07-22 ENCOUNTER — Ambulatory Visit: Payer: Medicare HMO | Admitting: Podiatry

## 2023-08-12 IMAGING — DX DG CHEST 1V PORT
1 series · 1 of 1 positions shown · non-contrast
Comparison: Previous studies including the examination of
05/02/2019

CLINICAL DATA: Shortness of breath

EXAM:
PORTABLE CHEST 1 VIEW

[chest ap]
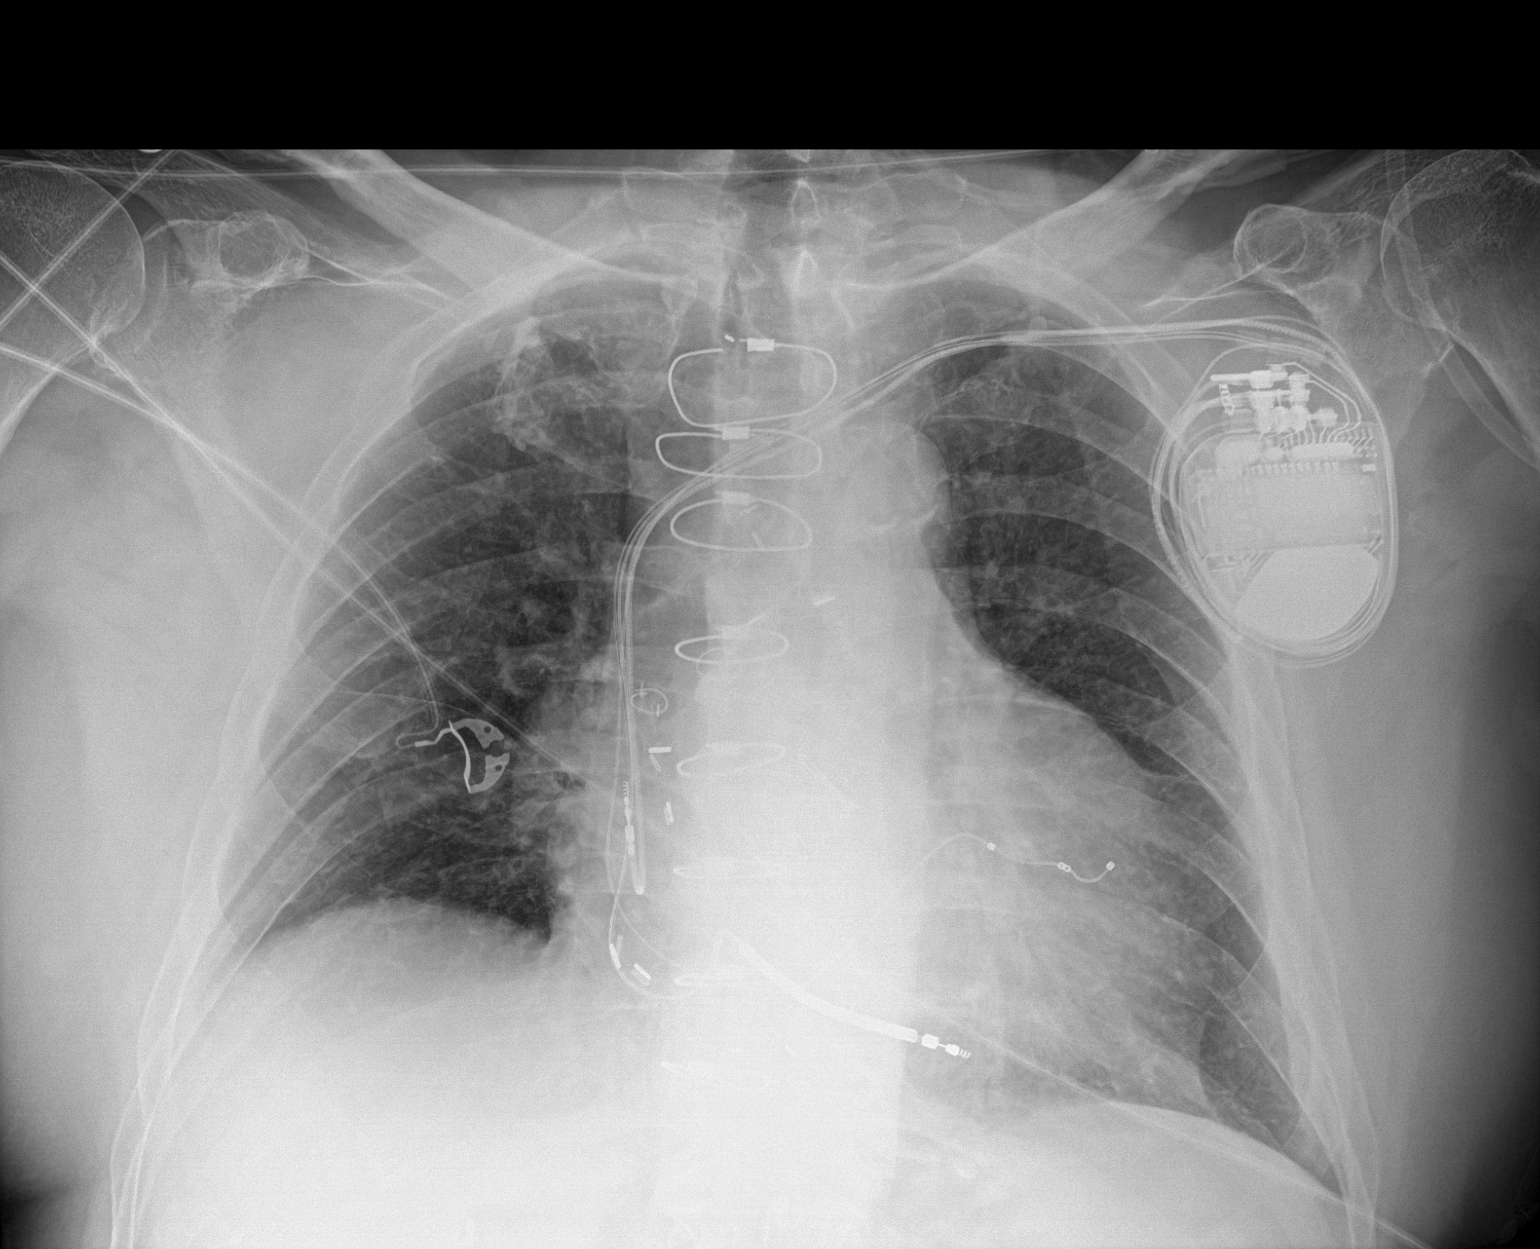

[1 of 1 positions shown; findings below may reference images not displayed]

FINDINGS: Transverse diameter of heart is increased. There is prosthetic
device in the region of the aortic annulus. Pacemaker/defibrillator
battery is seen in the left infraclavicular region. Biventricular
pacer leads are noted in place. Lung fields are clear of any
infiltrates or pulmonary edema. There is no significant pleural
effusion or pneumothorax.
IMPRESSION: Cardiomegaly. There are no new infiltrates or signs of pulmonary
edema.
